# Patient Record
Sex: Male | Born: 1960 | Race: White | Hispanic: No | Smoking: Former smoker
Health system: Southern US, Community
[De-identification: ages and names within clinical notes are randomized; demographics above are authoritative.]

## PROBLEM LIST (undated history)

## (undated) DIAGNOSIS — E785 Hyperlipidemia, unspecified: Secondary | ICD-10-CM

## (undated) DIAGNOSIS — F1011 Alcohol abuse, in remission: Secondary | ICD-10-CM

## (undated) DIAGNOSIS — G8929 Other chronic pain: Secondary | ICD-10-CM

## (undated) DIAGNOSIS — M545 Low back pain, unspecified: Secondary | ICD-10-CM

## (undated) DIAGNOSIS — F1120 Opioid dependence, uncomplicated: Secondary | ICD-10-CM

## (undated) DIAGNOSIS — F329 Major depressive disorder, single episode, unspecified: Secondary | ICD-10-CM

## (undated) DIAGNOSIS — R112 Nausea with vomiting, unspecified: Secondary | ICD-10-CM

## (undated) DIAGNOSIS — R42 Dizziness and giddiness: Secondary | ICD-10-CM

## (undated) DIAGNOSIS — G4700 Insomnia, unspecified: Secondary | ICD-10-CM

## (undated) DIAGNOSIS — C4490 Unspecified malignant neoplasm of skin, unspecified: Secondary | ICD-10-CM

## (undated) DIAGNOSIS — K859 Acute pancreatitis without necrosis or infection, unspecified: Secondary | ICD-10-CM

## (undated) DIAGNOSIS — R202 Paresthesia of skin: Secondary | ICD-10-CM

## (undated) DIAGNOSIS — Z9889 Other specified postprocedural states: Secondary | ICD-10-CM

## (undated) DIAGNOSIS — R200 Anesthesia of skin: Secondary | ICD-10-CM

## (undated) DIAGNOSIS — F32A Depression, unspecified: Secondary | ICD-10-CM

## (undated) DIAGNOSIS — R9431 Abnormal electrocardiogram [ECG] [EKG]: Secondary | ICD-10-CM

## (undated) DIAGNOSIS — F419 Anxiety disorder, unspecified: Secondary | ICD-10-CM

## (undated) DIAGNOSIS — R531 Weakness: Secondary | ICD-10-CM

## (undated) DIAGNOSIS — M1990 Unspecified osteoarthritis, unspecified site: Secondary | ICD-10-CM

## (undated) DIAGNOSIS — I7771 Dissection of carotid artery: Secondary | ICD-10-CM

## (undated) DIAGNOSIS — I723 Aneurysm of iliac artery: Secondary | ICD-10-CM

## (undated) HISTORY — PX: TIBIA FRACTURE SURGERY: SHX806

## (undated) HISTORY — DX: Anesthesia of skin: R20.0

## (undated) HISTORY — DX: Abnormal electrocardiogram (ECG) (EKG): R94.31

## (undated) HISTORY — PX: MOHS SURGERY: SUR867

## (undated) HISTORY — DX: Dissection of carotid artery: I77.71

## (undated) HISTORY — PX: ANTERIOR CERVICAL DECOMP/DISCECTOMY FUSION: SHX1161

## (undated) HISTORY — DX: Anesthesia of skin: R20.2

## (undated) HISTORY — DX: Opioid dependence, uncomplicated: F11.20

## (undated) HISTORY — PX: APPENDECTOMY: SHX54

## (undated) HISTORY — PX: COLONOSCOPY: SHX174

## (undated) HISTORY — PX: BACK SURGERY: SHX140

---

## 2001-09-19 ENCOUNTER — Encounter: Payer: Self-pay | Admitting: Neurosurgery

## 2001-09-19 ENCOUNTER — Encounter: Admission: RE | Admit: 2001-09-19 | Discharge: 2001-09-19 | Payer: Self-pay | Admitting: Neurosurgery

## 2001-09-29 ENCOUNTER — Encounter: Admission: RE | Admit: 2001-09-29 | Discharge: 2001-09-29 | Payer: Self-pay | Admitting: Neurosurgery

## 2001-09-29 ENCOUNTER — Encounter: Payer: Self-pay | Admitting: Neurosurgery

## 2001-10-13 ENCOUNTER — Encounter: Admission: RE | Admit: 2001-10-13 | Discharge: 2001-10-13 | Payer: Self-pay | Admitting: Neurosurgery

## 2001-10-13 ENCOUNTER — Encounter: Payer: Self-pay | Admitting: Neurosurgery

## 2001-10-23 ENCOUNTER — Encounter: Admission: RE | Admit: 2001-10-23 | Discharge: 2001-10-23 | Payer: Self-pay | Admitting: Neurosurgery

## 2001-10-23 ENCOUNTER — Encounter: Payer: Self-pay | Admitting: Neurosurgery

## 2001-12-29 ENCOUNTER — Encounter: Payer: Self-pay | Admitting: Neurosurgery

## 2001-12-29 ENCOUNTER — Inpatient Hospital Stay (HOSPITAL_COMMUNITY): Admission: RE | Admit: 2001-12-29 | Discharge: 2001-12-30 | Payer: Self-pay | Admitting: Neurosurgery

## 2002-04-19 ENCOUNTER — Encounter: Payer: Self-pay | Admitting: Family Medicine

## 2002-04-19 ENCOUNTER — Encounter: Admission: RE | Admit: 2002-04-19 | Discharge: 2002-04-19 | Payer: Self-pay | Admitting: Family Medicine

## 2002-05-24 ENCOUNTER — Encounter: Admission: RE | Admit: 2002-05-24 | Discharge: 2002-05-24 | Payer: Self-pay | Admitting: Family Medicine

## 2002-05-24 ENCOUNTER — Encounter: Payer: Self-pay | Admitting: Family Medicine

## 2002-07-23 ENCOUNTER — Encounter: Admission: RE | Admit: 2002-07-23 | Discharge: 2002-07-23 | Payer: Self-pay | Admitting: Gastroenterology

## 2002-07-23 ENCOUNTER — Encounter: Payer: Self-pay | Admitting: Gastroenterology

## 2003-01-14 ENCOUNTER — Emergency Department (HOSPITAL_COMMUNITY): Admission: AD | Admit: 2003-01-14 | Discharge: 2003-01-14 | Payer: Self-pay | Admitting: Family Medicine

## 2003-01-16 ENCOUNTER — Ambulatory Visit (HOSPITAL_COMMUNITY): Admission: RE | Admit: 2003-01-16 | Discharge: 2003-01-16 | Payer: Self-pay | Admitting: Family Medicine

## 2003-02-06 ENCOUNTER — Encounter: Admission: RE | Admit: 2003-02-06 | Discharge: 2003-02-06 | Payer: Self-pay | Admitting: Family Medicine

## 2004-02-11 ENCOUNTER — Encounter: Admission: RE | Admit: 2004-02-11 | Discharge: 2004-02-11 | Payer: Self-pay | Admitting: Neurosurgery

## 2004-10-06 ENCOUNTER — Encounter: Admission: RE | Admit: 2004-10-06 | Discharge: 2004-10-06 | Payer: Self-pay | Admitting: Orthopedic Surgery

## 2005-08-26 ENCOUNTER — Encounter: Admission: RE | Admit: 2005-08-26 | Discharge: 2005-08-26 | Payer: Self-pay | Admitting: Neurosurgery

## 2006-01-31 ENCOUNTER — Encounter: Admission: RE | Admit: 2006-01-31 | Discharge: 2006-01-31 | Payer: Self-pay | Admitting: Family Medicine

## 2007-03-09 HISTORY — PX: OTHER SURGICAL HISTORY: SHX169

## 2007-07-07 ENCOUNTER — Encounter: Admission: RE | Admit: 2007-07-07 | Discharge: 2007-07-07 | Payer: Self-pay | Admitting: Neurosurgery

## 2008-01-26 ENCOUNTER — Encounter: Admission: RE | Admit: 2008-01-26 | Discharge: 2008-01-26 | Payer: Self-pay | Admitting: Orthopedic Surgery

## 2008-02-08 ENCOUNTER — Ambulatory Visit (HOSPITAL_BASED_OUTPATIENT_CLINIC_OR_DEPARTMENT_OTHER): Admission: RE | Admit: 2008-02-08 | Discharge: 2008-02-08 | Payer: Self-pay | Admitting: Orthopedic Surgery

## 2008-11-28 ENCOUNTER — Encounter: Admission: RE | Admit: 2008-11-28 | Discharge: 2008-11-28 | Payer: Self-pay | Admitting: Family Medicine

## 2009-03-08 HISTORY — PX: KNEE ARTHROSCOPY: SHX127

## 2009-07-16 ENCOUNTER — Encounter: Admission: RE | Admit: 2009-07-16 | Discharge: 2009-07-16 | Payer: Self-pay | Admitting: Neurosurgery

## 2009-08-01 ENCOUNTER — Encounter: Admission: RE | Admit: 2009-08-01 | Discharge: 2009-08-01 | Payer: Self-pay | Admitting: Neurosurgery

## 2009-08-14 ENCOUNTER — Encounter: Admission: RE | Admit: 2009-08-14 | Discharge: 2009-08-14 | Payer: Self-pay | Admitting: Neurosurgery

## 2009-09-23 ENCOUNTER — Encounter: Admission: RE | Admit: 2009-09-23 | Discharge: 2009-09-23 | Payer: Self-pay | Admitting: Neurosurgery

## 2009-12-16 ENCOUNTER — Encounter: Admission: RE | Admit: 2009-12-16 | Discharge: 2009-12-16 | Payer: Self-pay | Admitting: Neurosurgery

## 2010-01-16 ENCOUNTER — Encounter: Admission: RE | Admit: 2010-01-16 | Discharge: 2010-01-16 | Payer: Self-pay | Admitting: Neurosurgery

## 2010-05-06 ENCOUNTER — Other Ambulatory Visit: Payer: Self-pay | Admitting: Neurosurgery

## 2010-05-06 ENCOUNTER — Ambulatory Visit
Admission: RE | Admit: 2010-05-06 | Discharge: 2010-05-06 | Disposition: A | Discharge status: Home / Self Care | Payer: PRIVATE HEALTH INSURANCE | Source: Ambulatory Visit | Attending: Neurosurgery | Admitting: Neurosurgery

## 2010-05-06 DIAGNOSIS — M542 Cervicalgia: Secondary | ICD-10-CM

## 2010-05-06 DIAGNOSIS — R29898 Other symptoms and signs involving the musculoskeletal system: Secondary | ICD-10-CM

## 2010-07-21 NOTE — Op Note (Signed)
NAMEGIFFORD, Leonard [name] NO.:  [id number]   MEDICAL RECORD NO.:  [id number]          PATIENT TYPE:  AMB   LOCATION:  DSC                          FACILITY:  [facility]   PHYSICIAN:  Loreta Ave, M.D. DATE OF BIRTH:  1960/10/03   DATE OF PROCEDURE:  02/08/2008  DATE OF DISCHARGE:                               OPERATIVE REPORT   PREOPERATIVE DIAGNOSIS:  Medial meniscus tear, right knee.   POSTOPERATIVE DIAGNOSIS:  Medial meniscus tear, right knee.   PROCEDURE:  Right knee exam under anesthesia, arthroscopy, and partial  medial meniscectomy.   SURGEON:  Loreta Ave, MD   ASSISTANT:  Genene Churn. Barry Dienes, [place]   ANESTHESIA:  Knee block with sedation.   SPECIMENS:  None.   CONSULTS:  None.   COMPLICATIONS:  None.   DRESSINGS:  Soft compressive.   PROCEDURE:  The patient was brought to the operating room, placed on the  operating table in supine position.  After adequate anesthesia had been  obtained, knee examined.  Good motion and good stability.  Tourniquet  and leg holder applied.  Leg prepped and draped in usual sterile  fashion.  Two portals, one each medial and lateral parapatellar.  Arthroscope introduced, knee distended and knee inspected.  Good  patellofemoral tracking.  Hypertrophic synovitis medially debrided.  Lateral meniscus, lateral compartment, and cruciate ligaments were  normal.  Medial compartment looked good but complex tearing, posterior  medial meniscus.  Taken down through a stable rim, tapered into  remaining meniscus.  Entire knee examined.  All the findings  appreciated.  Instruments and fluid removed.  Portals of knee injected  with Marcaine.  Portals closed with 4-0 nylon.  Sterile compressive  dressing applied.  Anesthesia reversed.  Brought to recovery room.  Tolerated surgery well.  No complications.      Loreta Ave, M.D.  Electronically Signed     DFM/MEDQ  D:  02/08/2008  T:  02/08/2008  Job:  [id number]

## 2010-07-24 NOTE — H&P (Signed)
NAME:  Leonard [name], Leonard [name]                        ACCOUNT NO.:  [id number]   MEDICAL RECORD NO.:  [id number]                   PATIENT TYPE:  INP   LOCATION:  NA                                   FACILITY:  [facility]   PHYSICIAN:  Payton Doughty, M.D.                   DATE OF BIRTH:  [birth year]   DATE OF ADMISSION:  12/29/2001  DATE OF DISCHARGE:                                HISTORY & PHYSICAL   ADMISSION DIAGNOSIS:  Herniated disk C3-4.   HISTORY OF PRESENT ILLNESS:  This is a 50 year old right-handed white  gentleman whom we saw in July.  He had been having pain in the base of his  neck which resolved.  He had then pain in his neck toward his right  shoulder, numbness of the right arm, and pain in the right trapezius.  MRI  showed a disk at C3-4.  Did cervical epidural steroids, had a transient  improvement but still has intractable pain.  He is now admitted for an  anterior cervical diskectomy and fusion.   PAST MEDICAL HISTORY:  Otherwise. unremarkable.   MEDICATIONS:  1. Allegra 60 mg once daily.  2. Valium 5 mg at night.  3. Vicodin 5/500 1-2 at night.   PAST SURGICAL HISTORY:  1. Appendectomy at 13.  2. Right hand operation by Dr. Teressa Senter for penetrating injury in the palm.   SOCIAL HISTORY:  The patient does not smoke, drinks only socially, and is a  Firefighter.   FAMILY HISTORY:  Mother is 10, in good health with arthritis.  Dad is  deceased, no history is given.   REVIEW OF SYSTEMS:  Remarkable for occasional indigestion, broken bone in  his leg, arm weakness, arm pain, neck pain, food and inhalant allergies.   PHYSICAL EXAMINATION:  HEENT:  Within normal limits.  NECK:  Reasonable range of motion, turning head towards right or left pain  into right shoulder and can extend his neck.  CHEST:  Clear.  CARDIAC:  Regular rate and rhythm.  ABDOMEN:  Nontender with no hepatosplenomegaly.  EXTREMITIES:  Without clubbing or cyanosis.  GU:  Exam deferred.  PULSES:   Peripheral pulses are good.  NEUROLOGIC:  Awake, alert, and oriented.  Cranial nerves are intact.  Motor  exam shows 5/5 strength throughout the upper and lower extremities.  No  current sensory defect.  Reflexes 1 at the biceps, 1 at the triceps, 1 at  the brachial radialis bilaterally.  Hoffmann's is negative.  Lower  extremities are nonmyelopathic.   LABORATORY DATA:  MRI shows spondylitic disease at C3-4 with disk eccentric  to the right and neuroforaminal compression of the right C4 root.   CLINICAL IMPRESSION:  Cervical spondylosis with foraminal spur at C3-4.   PLAN:  Anterior cervical diskectomy and fusion.  The risks and benefits of  this approach have been discussed, and the patient wishes to proceed.  Payton Doughty, M.D.    MWR/MEDQ  D:  12/29/2001  T:  12/29/2001  Job:  [id number]

## 2010-07-24 NOTE — Op Note (Signed)
NAME:  EVERETT, EHRLER                        ACCOUNT NO.:  [id number]   MEDICAL RECORD NO.:  [id number]                   PATIENT TYPE:  INP   LOCATION:  NA                                   FACILITY:  [facility]   PHYSICIAN:  Payton Doughty, M.D.                   DATE OF BIRTH:  October 13, 1960   DATE OF PROCEDURE:  12/29/2001  DATE OF DISCHARGE:                                 OPERATIVE REPORT   PREOPERATIVE DIAGNOSIS:  Herniated disk at C3-4.   POSTOPERATIVE DIAGNOSIS:  Herniated disk at C3-4.   PROCEDURE:  C3-4 anterior cervical diskectomy and fusion with a Tether  plate.   SURGEON:  Payton Doughty, M.D.   ASSISTANTBasilia Jumbo.   ANESTHESIA:  General endotracheal.   PREPARATION:  Sterile Betadine prep and scrub with alcohol wipe.   DESCRIPTION OF PROCEDURE:  A 50 year old gentleman with a disk at C3-4.  He  was taken to the operating room and smoothly anesthetized and intubated,  placed supine on the operating table in the Holter head traction.  Following  shave, prep, and drape in the usual sterile fashion, the skin was incised in  the midline in the medial border of the sternocleidomastoid muscle on the  left side two fingerbreadths above the carotid tubercle.  The platysma was  identified, elevated, divided, and undermined.  The sternocleidomastoid was  identified, and medial dissection revealed the carotid artery retracted to  the left, the trachea and esophagus retracted laterally to the right,  exposing the bones of the anterior cervical spine.  A marker was placed and  intraoperative x-ray obtained to confirm correctness of level.  Having  confirmed correctness of the level, diskectomy was carried out at C3-4 under  gross observation.  We then brought in the operating microscope, and we used  microdissection technique to dissect the anterior epidural space to  decompress the nerve roots bilaterally.  On the right side there was disk  material and bone spur out compressing  the right C4 root.  Having achieved  complete decompression and removed the posterior longitudinal ligament, a 7  mm bone graft was fashioned from patellar allograft and tapped into place.  A 14 mm Tether plate was then placed with 13 mm screws, two in C3 and two in  C4.  Intraoperative x-ray showed good placement of bone graft, plate, and  screws.  The wound was irrigated and hemostasis assured.  The platysma was  reapproximated with 3-0 Vicryl in interrupted fashion, the subcutaneous  tissue was reapproximated with 3-0 Vicryl in interrupted fashion, and the  skin was closed with 4-0 Vicryl in running subcuticular fashion.  Benzoin  and Steri-Strips were placed and made occlusive with Telfa and OpSite.  The  patient was then placed in an Aspen collar and returned to the recovery room  in good condition.  Payton Doughty, M.D.    MWR/MEDQ  D:  12/29/2001  T:  12/29/2001  Job:  [id number]

## 2010-12-10 LAB — POCT HEMOGLOBIN-HEMACUE: Hemoglobin: 16 g/dL (ref 13.0–17.0)

## 2011-07-13 ENCOUNTER — Other Ambulatory Visit: Payer: Self-pay | Admitting: Neurosurgery

## 2011-07-13 DIAGNOSIS — M47817 Spondylosis without myelopathy or radiculopathy, lumbosacral region: Secondary | ICD-10-CM

## 2011-07-21 ENCOUNTER — Ambulatory Visit
Admission: RE | Admit: 2011-07-21 | Discharge: 2011-07-21 | Disposition: A | Discharge status: Home / Self Care | Payer: PRIVATE HEALTH INSURANCE | Source: Ambulatory Visit | Attending: Neurosurgery | Admitting: Neurosurgery

## 2011-07-21 DIAGNOSIS — M47817 Spondylosis without myelopathy or radiculopathy, lumbosacral region: Secondary | ICD-10-CM

## 2011-08-05 ENCOUNTER — Other Ambulatory Visit: Payer: Self-pay | Admitting: Neurosurgery

## 2011-08-05 DIAGNOSIS — M47816 Spondylosis without myelopathy or radiculopathy, lumbar region: Secondary | ICD-10-CM

## 2011-08-10 ENCOUNTER — Ambulatory Visit
Admission: RE | Admit: 2011-08-10 | Discharge: 2011-08-10 | Disposition: A | Discharge status: Home / Self Care | Payer: PRIVATE HEALTH INSURANCE | Source: Ambulatory Visit | Attending: Neurosurgery | Admitting: Neurosurgery

## 2011-08-10 DIAGNOSIS — M47816 Spondylosis without myelopathy or radiculopathy, lumbar region: Secondary | ICD-10-CM

## 2011-08-10 MED ORDER — IOHEXOL 180 MG/ML  SOLN
1.0000 mL | Freq: Once | INTRAMUSCULAR | Status: AC | PRN
  Administered 2011-08-10: 1 mL via EPIDURAL

## 2011-08-10 MED ORDER — METHYLPREDNISOLONE ACETATE 40 MG/ML INJ SUSP (RADIOLOG
60.0000 mg | Freq: Once | INTRAMUSCULAR | Status: AC
  Administered 2011-08-10: 60 mg via EPIDURAL

## 2011-08-12 ENCOUNTER — Other Ambulatory Visit: Payer: PRIVATE HEALTH INSURANCE

## 2011-08-23 ENCOUNTER — Other Ambulatory Visit: Payer: Self-pay | Admitting: Family Medicine

## 2011-08-23 ENCOUNTER — Ambulatory Visit
Admission: RE | Admit: 2011-08-23 | Discharge: 2011-08-23 | Disposition: A | Discharge status: Home / Self Care | Payer: PRIVATE HEALTH INSURANCE | Source: Ambulatory Visit | Attending: Family Medicine | Admitting: Family Medicine

## 2011-08-23 DIAGNOSIS — M79609 Pain in unspecified limb: Secondary | ICD-10-CM

## 2011-10-14 ENCOUNTER — Other Ambulatory Visit: Payer: Self-pay | Admitting: Neurosurgery

## 2011-10-14 DIAGNOSIS — M47816 Spondylosis without myelopathy or radiculopathy, lumbar region: Secondary | ICD-10-CM

## 2012-01-04 ENCOUNTER — Ambulatory Visit
Admission: RE | Admit: 2012-01-04 | Discharge: 2012-01-04 | Disposition: A | Discharge status: Home / Self Care | Payer: PRIVATE HEALTH INSURANCE | Source: Ambulatory Visit | Attending: Neurosurgery | Admitting: Neurosurgery

## 2012-01-04 ENCOUNTER — Other Ambulatory Visit: Payer: Self-pay | Admitting: Neurosurgery

## 2012-01-04 DIAGNOSIS — M47816 Spondylosis without myelopathy or radiculopathy, lumbar region: Secondary | ICD-10-CM

## 2012-01-04 MED ORDER — METHYLPREDNISOLONE ACETATE 40 MG/ML INJ SUSP (RADIOLOG
120.0000 mg | Freq: Once | INTRAMUSCULAR | Status: AC
  Administered 2012-01-04: 120 mg via EPIDURAL

## 2012-01-04 MED ORDER — IOHEXOL 180 MG/ML  SOLN
1.0000 mL | Freq: Once | INTRAMUSCULAR | Status: AC | PRN
  Administered 2012-01-04: 1 mL via EPIDURAL

## 2012-03-06 ENCOUNTER — Other Ambulatory Visit: Payer: Self-pay | Admitting: Neurosurgery

## 2012-03-06 DIAGNOSIS — M47816 Spondylosis without myelopathy or radiculopathy, lumbar region: Secondary | ICD-10-CM

## 2012-03-07 ENCOUNTER — Ambulatory Visit
Admission: RE | Admit: 2012-03-07 | Discharge: 2012-03-07 | Disposition: A | Discharge status: Home / Self Care | Payer: PRIVATE HEALTH INSURANCE | Source: Ambulatory Visit | Attending: Neurosurgery | Admitting: Neurosurgery

## 2012-03-07 DIAGNOSIS — M47816 Spondylosis without myelopathy or radiculopathy, lumbar region: Secondary | ICD-10-CM

## 2012-03-07 MED ORDER — IOHEXOL 180 MG/ML  SOLN
1.0000 mL | Freq: Once | INTRAMUSCULAR | Status: AC | PRN
  Administered 2012-03-07: 1 mL via EPIDURAL

## 2012-03-07 MED ORDER — METHYLPREDNISOLONE ACETATE 40 MG/ML INJ SUSP (RADIOLOG
120.0000 mg | Freq: Once | INTRAMUSCULAR | Status: AC
  Administered 2012-03-07: 120 mg via EPIDURAL

## 2012-03-08 HISTORY — PX: LUMBAR LAMINECTOMY: SHX95

## 2012-05-29 ENCOUNTER — Other Ambulatory Visit: Payer: Self-pay | Admitting: Neurosurgery

## 2012-05-29 DIAGNOSIS — M545 Low back pain: Secondary | ICD-10-CM

## 2012-05-29 DIAGNOSIS — M47816 Spondylosis without myelopathy or radiculopathy, lumbar region: Secondary | ICD-10-CM

## 2012-05-29 DIAGNOSIS — M79604 Pain in right leg: Secondary | ICD-10-CM

## 2012-05-30 ENCOUNTER — Other Ambulatory Visit: Payer: PRIVATE HEALTH INSURANCE

## 2012-05-30 ENCOUNTER — Ambulatory Visit
Admission: RE | Admit: 2012-05-30 | Discharge: 2012-05-30 | Disposition: A | Discharge status: Home / Self Care | Payer: PRIVATE HEALTH INSURANCE | Source: Ambulatory Visit | Attending: Neurosurgery | Admitting: Neurosurgery

## 2012-05-30 DIAGNOSIS — M47816 Spondylosis without myelopathy or radiculopathy, lumbar region: Secondary | ICD-10-CM

## 2012-05-30 DIAGNOSIS — M79604 Pain in right leg: Secondary | ICD-10-CM

## 2012-05-30 DIAGNOSIS — M545 Low back pain: Secondary | ICD-10-CM

## 2012-06-28 ENCOUNTER — Other Ambulatory Visit: Payer: Self-pay | Admitting: Neurosurgery

## 2012-06-28 DIAGNOSIS — M47816 Spondylosis without myelopathy or radiculopathy, lumbar region: Secondary | ICD-10-CM

## 2012-07-04 ENCOUNTER — Other Ambulatory Visit: Payer: PRIVATE HEALTH INSURANCE

## 2012-07-04 ENCOUNTER — Ambulatory Visit
Admission: RE | Admit: 2012-07-04 | Discharge: 2012-07-04 | Disposition: A | Discharge status: Home / Self Care | Payer: PRIVATE HEALTH INSURANCE | Source: Ambulatory Visit | Attending: Neurosurgery | Admitting: Neurosurgery

## 2012-07-04 DIAGNOSIS — M47816 Spondylosis without myelopathy or radiculopathy, lumbar region: Secondary | ICD-10-CM

## 2012-07-04 MED ORDER — METHYLPREDNISOLONE ACETATE 40 MG/ML INJ SUSP (RADIOLOG
120.0000 mg | Freq: Once | INTRAMUSCULAR | Status: AC
  Administered 2012-07-04: 120 mg via INTRA_ARTICULAR

## 2012-07-04 MED ORDER — IOHEXOL 180 MG/ML  SOLN
1.0000 mL | Freq: Once | INTRAMUSCULAR | Status: AC | PRN
  Administered 2012-07-04: 1 mL via INTRA_ARTICULAR

## 2012-10-18 ENCOUNTER — Other Ambulatory Visit: Payer: Self-pay | Admitting: Family Medicine

## 2012-10-18 DIAGNOSIS — R7989 Other specified abnormal findings of blood chemistry: Secondary | ICD-10-CM

## 2012-10-20 ENCOUNTER — Ambulatory Visit
Admission: RE | Admit: 2012-10-20 | Discharge: 2012-10-20 | Disposition: A | Discharge status: Home / Self Care | Payer: PRIVATE HEALTH INSURANCE | Source: Ambulatory Visit | Attending: Family Medicine | Admitting: Family Medicine

## 2012-10-20 DIAGNOSIS — R7989 Other specified abnormal findings of blood chemistry: Secondary | ICD-10-CM

## 2013-03-08 HISTORY — PX: POSTERIOR LUMBAR FUSION: SHX6036

## 2013-03-27 ENCOUNTER — Other Ambulatory Visit: Payer: Self-pay | Admitting: Neurosurgery

## 2013-03-27 DIAGNOSIS — M47817 Spondylosis without myelopathy or radiculopathy, lumbosacral region: Secondary | ICD-10-CM

## 2013-03-31 ENCOUNTER — Ambulatory Visit
Admission: RE | Admit: 2013-03-31 | Discharge: 2013-03-31 | Disposition: A | Discharge status: Home / Self Care | Payer: PRIVATE HEALTH INSURANCE | Source: Ambulatory Visit | Attending: Neurosurgery | Admitting: Neurosurgery

## 2013-03-31 DIAGNOSIS — M47817 Spondylosis without myelopathy or radiculopathy, lumbosacral region: Secondary | ICD-10-CM

## 2013-03-31 MED ORDER — GADOBENATE DIMEGLUMINE 529 MG/ML IV SOLN
17.0000 mL | Freq: Once | INTRAVENOUS | Status: AC | PRN
  Administered 2013-03-31: 17 mL via INTRAVENOUS

## 2013-04-03 ENCOUNTER — Other Ambulatory Visit: Payer: Self-pay | Admitting: Family Medicine

## 2013-04-03 DIAGNOSIS — I723 Aneurysm of iliac artery: Secondary | ICD-10-CM

## 2013-04-12 ENCOUNTER — Ambulatory Visit
Admission: RE | Admit: 2013-04-12 | Discharge: 2013-04-12 | Disposition: A | Discharge status: Home / Self Care | Payer: PRIVATE HEALTH INSURANCE | Source: Ambulatory Visit | Attending: Family Medicine | Admitting: Family Medicine

## 2013-04-12 DIAGNOSIS — I723 Aneurysm of iliac artery: Secondary | ICD-10-CM

## 2013-04-12 MED ORDER — IOHEXOL 350 MG/ML SOLN
100.0000 mL | Freq: Once | INTRAVENOUS | Status: AC | PRN
  Administered 2013-04-12: 100 mL via INTRAVENOUS

## 2013-08-09 ENCOUNTER — Other Ambulatory Visit: Payer: Self-pay | Admitting: Neurosurgery

## 2013-08-19 ENCOUNTER — Emergency Department (HOSPITAL_COMMUNITY)
Admission: EM | Admit: 2013-08-19 | Discharge: 2013-08-19 | Discharge status: Against Medical Advice | Payer: PRIVATE HEALTH INSURANCE | Attending: Emergency Medicine | Admitting: Emergency Medicine

## 2013-08-19 ENCOUNTER — Encounter (HOSPITAL_COMMUNITY): Payer: Self-pay | Admitting: Emergency Medicine

## 2013-08-19 ENCOUNTER — Emergency Department (HOSPITAL_COMMUNITY)
Admission: EM | Admit: 2013-08-19 | Discharge: 2013-08-20 | Disposition: A | Discharge status: Home / Self Care | Payer: PRIVATE HEALTH INSURANCE | Attending: Emergency Medicine | Admitting: Emergency Medicine

## 2013-08-19 DIAGNOSIS — L539 Erythematous condition, unspecified: Secondary | ICD-10-CM | POA: Insufficient documentation

## 2013-08-19 DIAGNOSIS — F1120 Opioid dependence, uncomplicated: Secondary | ICD-10-CM

## 2013-08-19 DIAGNOSIS — F1910 Other psychoactive substance abuse, uncomplicated: Secondary | ICD-10-CM

## 2013-08-19 DIAGNOSIS — F1310 Sedative, hypnotic or anxiolytic abuse, uncomplicated: Secondary | ICD-10-CM | POA: Insufficient documentation

## 2013-08-19 DIAGNOSIS — F1510 Other stimulant abuse, uncomplicated: Secondary | ICD-10-CM | POA: Insufficient documentation

## 2013-08-19 DIAGNOSIS — F1010 Alcohol abuse, uncomplicated: Secondary | ICD-10-CM | POA: Insufficient documentation

## 2013-08-19 DIAGNOSIS — F1020 Alcohol dependence, uncomplicated: Secondary | ICD-10-CM | POA: Diagnosis present

## 2013-08-19 DIAGNOSIS — Z87891 Personal history of nicotine dependence: Secondary | ICD-10-CM | POA: Insufficient documentation

## 2013-08-19 DIAGNOSIS — Z79899 Other long term (current) drug therapy: Secondary | ICD-10-CM | POA: Insufficient documentation

## 2013-08-19 HISTORY — DX: Anxiety disorder, unspecified: F41.9

## 2013-08-19 HISTORY — DX: Major depressive disorder, single episode, unspecified: F32.9

## 2013-08-19 HISTORY — DX: Depression, unspecified: F32.A

## 2013-08-19 LAB — CBC WITH DIFFERENTIAL/PLATELET
Basophils Absolute: 0 10*3/uL (ref 0.0–0.1)
Basophils Relative: 0 % (ref 0–1)
Eosinophils Absolute: 0 10*3/uL (ref 0.0–0.7)
Eosinophils Relative: 0 % (ref 0–5)
HCT: 45.7 % (ref 39.0–52.0)
Hemoglobin: 16.3 g/dL (ref 13.0–17.0)
Lymphocytes Relative: 21 % (ref 12–46)
Lymphs Abs: 1 10*3/uL (ref 0.7–4.0)
MCH: 34.8 pg — ABNORMAL HIGH (ref 26.0–34.0)
MCHC: 35.7 g/dL (ref 30.0–36.0)
MCV: 97.6 fL (ref 78.0–100.0)
Monocytes Absolute: 0.4 10*3/uL (ref 0.1–1.0)
Monocytes Relative: 9 % (ref 3–12)
Neutro Abs: 3.3 10*3/uL (ref 1.7–7.7)
Neutrophils Relative %: 70 % (ref 43–77)
Platelets: 108 10*3/uL — ABNORMAL LOW (ref 150–400)
RBC: 4.68 MIL/uL (ref 4.22–5.81)
RDW: 13 % (ref 11.5–15.5)
WBC: 4.7 10*3/uL (ref 4.0–10.5)

## 2013-08-19 LAB — COMPREHENSIVE METABOLIC PANEL
ALT: 104 U/L — ABNORMAL HIGH (ref 0–53)
AST: 143 U/L — ABNORMAL HIGH (ref 0–37)
Albumin: 4.1 g/dL (ref 3.5–5.2)
Alkaline Phosphatase: 64 U/L (ref 39–117)
BUN: 10 mg/dL (ref 6–23)
CO2: 24 mEq/L (ref 19–32)
Calcium: 9.6 mg/dL (ref 8.4–10.5)
Chloride: 98 mEq/L (ref 96–112)
Creatinine, Ser: 0.73 mg/dL (ref 0.50–1.35)
GFR calc Af Amer: >90 mL/min (ref >90)
GFR calc non Af Amer: >90 mL/min (ref >90)
Glucose, Bld: 97 mg/dL (ref 70–99)
Potassium: 4.3 mEq/L (ref 3.7–5.3)
Sodium: 138 mEq/L (ref 137–147)
Total Bilirubin: 0.8 mg/dL (ref 0.3–1.2)
Total Protein: 7.6 g/dL (ref 6.0–8.3)

## 2013-08-19 LAB — RAPID URINE DRUG SCREEN, HOSP PERFORMED
Amphetamines: POSITIVE — AB
Barbiturates: NOT DETECTED
Benzodiazepines: POSITIVE — AB
Cocaine: NOT DETECTED
Opiates: NOT DETECTED
Tetrahydrocannabinol: NOT DETECTED

## 2013-08-19 LAB — ETHANOL: Alcohol, Ethyl (B): 57 mg/dL — ABNORMAL HIGH (ref 0–11)

## 2013-08-19 LAB — LIPASE, BLOOD: Lipase: 66 U/L — ABNORMAL HIGH (ref 11–59)

## 2013-08-19 MED ORDER — LORAZEPAM 2 MG/ML IJ SOLN
1.0000 mg | Freq: Once | INTRAMUSCULAR | Status: AC
  Administered 2013-08-19: 1 mg via INTRAVENOUS
  Filled 2013-08-19: qty 1

## 2013-08-19 MED ORDER — ONDANSETRON HCL 4 MG/2ML IJ SOLN
4.0000 mg | Freq: Once | INTRAMUSCULAR | Status: AC
  Administered 2013-08-19: 4 mg via INTRAVENOUS
  Filled 2013-08-19: qty 2

## 2013-08-19 MED ORDER — ONDANSETRON HCL 4 MG/2ML IJ SOLN: 4.0000 mg | Freq: Once | INTRAMUSCULAR | Status: DC

## 2013-08-19 MED ORDER — SODIUM CHLORIDE 0.9 % IV SOLN
INTRAVENOUS | Status: DC
  Administered 2013-08-19 – 2013-08-20 (×2): via INTRAVENOUS

## 2013-08-19 MED ORDER — SODIUM CHLORIDE 0.9 % IV SOLN
INTRAVENOUS | Status: DC
Start: 2013-08-20 — End: 2013-08-19

## 2013-08-19 MED ORDER — SODIUM CHLORIDE 0.9 % IV BOLUS (SEPSIS): 500.0000 mL | Freq: Once | INTRAVENOUS | Status: DC

## 2013-08-19 NOTE — BH Assessment (Signed)
Tele Assessment Note   Leonard [name] is an 53 y.o. male who presents voluntarily accompanied by his wife Suanne Marker.  Pt requesting detox from alcohol and oxycodone. Pt reported he has been dealing with major back pain for several years. Pt stated he had surgery 01/2013 and it has not helped with is back pain. Pt stated he has been taking 12-14 pills a day since November. Pt reported he has another back surgery scheduled for 09/10/13 and surgeon stated that pt needs to be off oxycodone for the surgery as well as alcohol. Pt reported increased alcohol use since 01/2013 as well. Pt reported drinking about 2 bottles of wine daily.  Pt reported experiencing shakiness. Pt denied hx of seizures and blackouts.   Pt Ox4. Pt denied SI, HI, and AVH. Pt reported anxious and depressed mood and presented with congruent affect.  Pt reported he has been depressed due to his back pain and the last surgery not working. Pt reported that it is difficult to get around much due to his back pain. Pt reported hx of SI but denied hx of attempts. Pt stated he wants to be here for his wife.  Pt reported family hx of alcohol abuse with his father and maternal grandfather who both died due to sorosis of the liver.    Axis I: Alcohol Use Disorder, moderate; Opioid Use Disorder, Moderate; Unspecified Depressive Disorder Axis II: Deferred Axis III: History reviewed. No pertinent past medical history. Axis IV: other psychosocial or environmental problems  Past Medical History: History reviewed. No pertinent past medical history.  Past Surgical History  Procedure Laterality Date  . Back surgery      Family History: No family history on file.  Social History:  reports that he quit smoking about 35 years ago. His smoking use included Cigarettes. He has a 1 pack-year smoking history. He has never used smokeless tobacco. His alcohol and drug histories are not on file.  Additional Social History:  Alcohol / Drug Use Pain  Medications: Oxcodone Prescriptions: Sertraline, Trazadone Over the Counter: none reported History of alcohol / drug use?: Yes Longest period of sobriety (when/how long): unk Withdrawal Symptoms: Tremors;Weakness;Sweats Substance #1 Name of Substance 1: alcohol 1 - Age of First Use: unk 1 - Amount (size/oz): 2 bottles of wine 1 - Frequency: daily 1 - Duration: increased use since Nov 2014 1 - Last Use / Amount: unk Substance #2 Name of Substance 2: opiates (oxycodone) 2 - Age of First Use: unk 2 - Amount (size/oz): 12-14 pills 2 - Frequency: daily 2 - Duration: since Nov 2014 2 - Last Use / Amount: 08/19/13  CIWA: CIWA-Ar BP: 125/87 mmHg Pulse Rate: 98 COWS:    Allergies:  Allergies  Allergen Reactions  . Sulfa Antibiotics Swelling and Rash    Home Medications:  (Not in a hospital admission)  OB/GYN Status:  No LMP for male patient.  General Assessment Data Location of Assessment: [facility] ED Is this a Tele or Face-to-Face Assessment?: Tele Assessment Is this an Initial Assessment or a Re-assessment for this encounter?: Initial Assessment Living Arrangements: Spouse/significant other Can pt return to current living arrangement?: Yes Admission Status: Voluntary Is patient capable of signing voluntary admission?: Yes Transfer from: [facility] Referral Source: Self/Family/Friend  Medical Screening Exam ([place]) Medical Exam completed:  (NA)  [place] Living Arrangements: Spouse/significant other Name of Psychiatrist:  (NA) Name of Therapist:  Altha Harm)     Risk to self Suicidal Ideation: No-Not Currently/Within  Last 6 Months Suicidal Intent: No Is patient at risk for suicide?: No Suicidal Plan?: No Access to Means: No What has been your use of drugs/alcohol within the last 12 months?:  (etoh and oxycodone) Previous Attempts/Gestures: No How many times?: 0 Other Self Harm Risks: none Triggers for Past Attempts: None  known Intentional Self Injurious Behavior: None Family Suicide History: Unknown Recent stressful life event(s): Other (Comment) (Back issues) Persecutory voices/beliefs?: No Depression: Yes Depression Symptoms: Despondent;Insomnia;Tearfulness;Fatigue;Guilt;Feeling worthless/self pity;Feeling angry/irritable Substance abuse history and/or treatment for substance abuse?: Yes Suicide prevention information given to non-admitted patients: Not applicable  Risk to Others Homicidal Ideation: No Thoughts of Harm to Others: No Current Homicidal Intent: No Current Homicidal Plan: No Access to Homicidal Means: No Identified Victim:  (NA) History of harm to others?: No Assessment of Violence: None Noted Violent Behavior Description:  (Pt calm and cooperative at assessment. ) Does patient have access to weapons?: Yes (Comment) (Pt stated he owns several guns. ) Criminal Charges Pending?: No Does patient have a court date: No  Psychosis Hallucinations: None noted Delusions: None noted  Mental Status Report Appear/Hygiene: Unremarkable Eye Contact: Good Motor Activity: Unremarkable Speech: Logical/coherent Level of Consciousness: Alert Mood: Depressed;Anxious Affect: Anxious;Depressed Anxiety Level: Moderate Thought Processes: Coherent;Relevant Judgement: Unimpaired Orientation: Person;Place;Situation;Time Obsessive Compulsive Thoughts/Behaviors: None  Cognitive Functioning Concentration: Normal Memory: Recent Intact;Remote Intact IQ: Average Insight: Fair Impulse Control: Fair Appetite: Poor Weight Loss:  (unk) Weight Gain:  (unk) Sleep: Decreased Total Hours of Sleep: 5 Vegetative Symptoms: None  ADLScreening [facility] Assessment Services) Patient's cognitive ability adequate to safely complete daily activities?: Yes Patient able to express need for assistance with ADLs?: No Independently performs ADLs?: Yes (appropriate for developmental age)  Prior Inpatient Therapy Prior  Inpatient Therapy: Yes Prior Therapy Dates:  (25 years ago) Prior Therapy Facilty/Provider(s):  (unk) Reason for Treatment:  (cocaine detox)  Prior Outpatient Therapy Prior Outpatient Therapy: Yes Prior Therapy Dates:  (current) Prior Therapy Facilty/Provider(s):  ([facility]) Reason for Treatment:  (depression, anxiety)  ADL Screening (condition at time of admission) Patient's cognitive ability adequate to safely complete daily activities?: Yes Is the patient deaf or have difficulty hearing?: No Does the patient have difficulty seeing, even when wearing glasses/contacts?: No Does the patient have difficulty concentrating, remembering, or making decisions?: No Patient able to express need for assistance with ADLs?: No Does the patient have difficulty dressing or bathing?: Yes Independently performs ADLs?: Yes (appropriate for developmental age)       Abuse/Neglect Assessment (Assessment to be complete while patient is alone) Physical Abuse: Denies Verbal Abuse: Denies Sexual Abuse: Denies Exploitation of patient/patient's resources: Denies Self-Neglect: Denies Values / Beliefs Cultural Requests During Hospitalization: None Spiritual Requests During Hospitalization: None   Advance Directives (For Healthcare) Advance Directive: Patient does not have advance directive    Additional Information 1:1 In Past 12 Months?: No CIRT Risk: No Elopement Risk: No Does patient have medical clearance?: Yes    Disposition: Clinician consulted with Serena Colonel, NP who recommends inpatient detox. Inocencio Homes, [facility] reported no available beds at [facility]. TTS will seek placement at alternative facilities.   Disposition Initial Assessment Completed for this Encounter: Yes Disposition of Patient: Inpatient treatment program Type of inpatient treatment program: Adult  Stewart,Lynne Takemoto R 08/19/2013 9:02 PM

## 2013-08-19 NOTE — BH Assessment (Signed)
Clinician consulted with Serena Colonel, NP who recommends inpatient detox. Leonard [name], [facility] reported no available beds at [facility]. TTS will seek placement at alternative facilities. Clinician provided updates to Dr. Rogene Houston.  Fredonia Highland, MSW, LCSW Triage Specialist [phone]

## 2013-08-19 NOTE — BH Assessment (Signed)
Clinician contacted Dr. Rogene Houston to gather information prior to assessing. Pt requesting alcohol and opiate detox. He feels addicted to opiates due pain issues. Assessment to be initiated.   Leonard [name], MSW, LCSW Triage Specialist [phone]

## 2013-08-19 NOTE — ED Notes (Signed)
Patient is here for detox from oxy and ETOH. Patient last drink was about 30 mins ago and last pain medication was at the same time. Patient had back surgery Nov 2014, since patient has been abusing pain medication along with ETOH. Patient was seen by primary care MD and told him he needed to get help. He is ready to make changes.

## 2013-08-19 NOTE — ED Notes (Signed)
Telepsych in progress. 

## 2013-08-19 NOTE — ED Notes (Signed)
Pt requesting detox from oxycodone and ETOH. Pt states he takes a lot of pain meds for his back injury. Pt uses a cane to ambulate. Pt states he is having spinal fusion surgery in July and is trying to detox from oxycodone and ETOH now for further pain control after the surgery. Pt arrives with his wife. Pt states last oxycodone was 15mg  at 1500. Last ETOH was a glass of wine at 1500. Pt alert, no acute distress. Skin warm and dry. Pt denies SI/HI. Pt is using a heating pad on his back.

## 2013-08-19 NOTE — ED Provider Notes (Addendum)
CSN: [id number]     Arrival date & time 08/19/13  1607 History   First MD Initiated Contact with Patient 08/19/13 1612     Chief Complaint  Patient presents with  . Medical Clearance  . Detox      (Consider location/radiation/quality/duration/timing/severity/associated sxs/prior Treatment) The history is provided by the patient.   53 year old male while presents for detox from alcohol and oxycodone. Patient states he last had oxycodone which was 15 mg at 3:00 in the afternoon last had alcohol was a glass of wine at 3:00 in the afternoon. Patient's alert in no acute distress upon arrival. Patient denies suicidal or homicidal ideation. Patient wants alcohol detox and detox from narcotics.  History reviewed. No pertinent past medical history. Past Surgical History  Procedure Laterality Date  . Back surgery     No family history on file. History  Substance Use Topics  . Smoking status: Former Smoker -- 0.50 packs/day for 2 years    Types: Cigarettes    Quit date: 01/03/1978  . Smokeless tobacco: Never Used  . Alcohol Use: Not on file    Review of Systems  Constitutional: Negative for fever.  HENT: Negative for congestion.   Eyes: Negative for visual disturbance.  Respiratory: Negative for shortness of breath.   Cardiovascular: Negative for chest pain.  Gastrointestinal: Negative for abdominal pain.  Genitourinary: Negative for dysuria.  Musculoskeletal: Negative for back pain.  Skin: Negative for rash.  Neurological: Negative for headaches.  Hematological: Does not bruise/bleed easily.  Psychiatric/Behavioral: Negative for confusion.      Allergies  Sulfa antibiotics  Home Medications   Prior to Admission medications   Medication Sig Start Date End Date Taking? Authorizing Provider  oxycodone (OXY-IR) 5 MG capsule Take 10 mg by mouth every 4 (four) hours as needed for pain.   Yes Historical Provider, MD  sertraline (ZOLOFT) 50 MG tablet Take 50 mg by mouth 2 (two)  times daily.   Yes Historical Provider, MD  traZODone (DESYREL) 100 MG tablet Take 100-300 mg by mouth at bedtime. Sleep disturbance   Yes Historical Provider, MD   BP 122/80  Pulse 99  Temp(Src) 98.1 F (36.7 C) (Oral)  Resp 18  SpO2 100% Physical Exam  Nursing note and vitals reviewed. Constitutional: He is oriented to person, place, and time. He appears well-developed and well-nourished. No distress.  HENT:  Head: Normocephalic and atraumatic.  Mouth/Throat: Oropharynx is clear and moist.  Eyes: Conjunctivae and EOM are normal. Pupils are equal, round, and reactive to light.  Neck: Normal range of motion.  Cardiovascular: Normal rate, regular rhythm and normal heart sounds.   Pulmonary/Chest: Effort normal and breath sounds normal.  Abdominal: Soft. Bowel sounds are normal. There is no tenderness.  Musculoskeletal: Normal range of motion. He exhibits no edema.  Neurological: He is alert and oriented to person, place, and time. No cranial nerve deficit. He exhibits normal muscle tone. Coordination normal.  Skin: Skin is warm. No rash noted. There is erythema.    ED Course  Procedures (including critical care time) Labs Review Labs Reviewed  COMPREHENSIVE METABOLIC PANEL - Abnormal; Notable for the following:    AST 143 (*)    ALT 104 (*)    All other components within normal limits  LIPASE, BLOOD - Abnormal; Notable for the following:    Lipase 66 (*)    All other components within normal limits  CBC WITH DIFFERENTIAL - Abnormal; Notable for the following:    MCH 34.8 (*)  Platelets 108 (*)    All other components within normal limits  ETHANOL - Abnormal; Notable for the following:    Alcohol, Ethyl (B) 57 (*)    All other components within normal limits  URINE RAPID DRUG SCREEN (HOSP PERFORMED) - Abnormal; Notable for the following:    Benzodiazepines POSITIVE (*)    Amphetamines POSITIVE (*)    All other components within normal limits   Results for orders placed  during the hospital encounter of 08/19/13  COMPREHENSIVE METABOLIC PANEL      Result Value Ref Range   Sodium 138  137 - 147 mEq/L   Potassium 4.3  3.7 - 5.3 mEq/L   Chloride 98  96 - 112 mEq/L   CO2 24  19 - 32 mEq/L   Glucose, Bld 97  70 - 99 mg/dL   BUN 10  6 - 23 mg/dL   Creatinine, Ser 0.73  0.50 - 1.35 mg/dL   Calcium 9.6  8.4 - 10.5 mg/dL   Total Protein 7.6  6.0 - 8.3 g/dL   Albumin 4.1  3.5 - 5.2 g/dL   AST 143 (*) 0 - 37 U/L   ALT 104 (*) 0 - 53 U/L   Alkaline Phosphatase 64  39 - 117 U/L   Total Bilirubin 0.8  0.3 - 1.2 mg/dL   GFR calc non Af Amer >90  >90 mL/min   GFR calc Af Amer >90  >90 mL/min  LIPASE, BLOOD      Result Value Ref Range   Lipase 66 (*) 11 - 59 U/L  CBC WITH DIFFERENTIAL      Result Value Ref Range   WBC 4.7  4.0 - 10.5 K/uL   RBC 4.68  4.22 - 5.81 MIL/uL   Hemoglobin 16.3  13.0 - 17.0 g/dL   HCT 45.7  39.0 - 52.0 %   MCV 97.6  78.0 - 100.0 fL   MCH 34.8 (*) 26.0 - 34.0 pg   MCHC 35.7  30.0 - 36.0 g/dL   RDW 13.0  11.5 - 15.5 %   Platelets 108 (*) 150 - 400 K/uL   Neutrophils Relative % 70  43 - 77 %   Lymphocytes Relative 21  12 - 46 %   Monocytes Relative 9  3 - 12 %   Eosinophils Relative 0  0 - 5 %   Basophils Relative 0  0 - 1 %   Neutro Abs 3.3  1.7 - 7.7 K/uL   Lymphs Abs 1.0  0.7 - 4.0 K/uL   Monocytes Absolute 0.4  0.1 - 1.0 K/uL   Eosinophils Absolute 0.0  0.0 - 0.7 K/uL   Basophils Absolute 0.0  0.0 - 0.1 K/uL   Smear Review PLATELET COUNT CONFIRMED BY SMEAR    ETHANOL      Result Value Ref Range   Alcohol, Ethyl (B) 57 (*) 0 - 11 mg/dL  URINE RAPID DRUG SCREEN (HOSP PERFORMED)      Result Value Ref Range   Opiates NONE DETECTED  NONE DETECTED   Cocaine NONE DETECTED  NONE DETECTED   Benzodiazepines POSITIVE (*) NONE DETECTED   Amphetamines POSITIVE (*) NONE DETECTED   Tetrahydrocannabinol NONE DETECTED  NONE DETECTED   Barbiturates NONE DETECTED  NONE DETECTED    Imaging Review No results found.   EKG  Interpretation None      MDM   Final diagnoses:  Polysubstance abuse    Patient with history of substance abuse. He got pain medications  and alcohol. Patient recently drank shortly before arrival. Alcohol level not significantly elevated. Patient nontoxic no acute distress at this point in time. Patient has been medically cleared. Behavioral health team to see the patient. No evidence of significant withdrawal this time. Patient was treated with Ativan.    Fredia Sorrow, MD 08/19/13 1833  Patient seen by behavioral health. They state that he does meet the criteria for inpatient detox. If the nothing is available in the immediate area will probably move him to the psych observation unit over at [place] until he can be placed. Patient is not a candidate to go back to psych ED due to the fact that he has a cane they're not allowed back in that unit.  Fredia Sorrow, MD 08/20/13 0010

## 2013-08-19 NOTE — ED Notes (Signed)
Patient is here for detox. Patient last drink was 45 mins ago and last took oxy 30mins ago. Patient states he has been taking oxy since Nov 2014 nd ready to get help.

## 2013-08-19 NOTE — ED Notes (Signed)
Pt's belongings wanded by security and placed in locker #26. Pt's wedding band removed and given to his wife.

## 2013-08-20 ENCOUNTER — Encounter (HOSPITAL_COMMUNITY): Payer: Self-pay | Admitting: Psychiatry

## 2013-08-20 DIAGNOSIS — F1120 Opioid dependence, uncomplicated: Secondary | ICD-10-CM | POA: Diagnosis present

## 2013-08-20 DIAGNOSIS — F1010 Alcohol abuse, uncomplicated: Secondary | ICD-10-CM

## 2013-08-20 DIAGNOSIS — F1910 Other psychoactive substance abuse, uncomplicated: Secondary | ICD-10-CM

## 2013-08-20 DIAGNOSIS — F1020 Alcohol dependence, uncomplicated: Secondary | ICD-10-CM | POA: Diagnosis present

## 2013-08-20 LAB — RAPID URINE DRUG SCREEN, HOSP PERFORMED
Amphetamines: POSITIVE — AB
Barbiturates: NOT DETECTED
Benzodiazepines: POSITIVE — AB
Cocaine: NOT DETECTED
Opiates: NOT DETECTED
Tetrahydrocannabinol: NOT DETECTED

## 2013-08-20 MED ORDER — ONDANSETRON 4 MG PO TBDP
4.0000 mg | ORAL_TABLET | Freq: Three times a day (TID) | ORAL | Status: DC | PRN
  Administered 2013-08-20: 4 mg via ORAL
  Filled 2013-08-20: qty 1

## 2013-08-20 MED ORDER — LORAZEPAM 1 MG PO TABS
1.0000 mg | ORAL_TABLET | Freq: Four times a day (QID) | ORAL | Status: DC | PRN
Start: 2013-08-20 — End: 2013-08-20
  Administered 2013-08-20 (×2): 1 mg via ORAL
  Filled 2013-08-20 (×2): qty 1

## 2013-08-20 NOTE — ED Notes (Signed)
Up to bathroom. Ambulates steady with no assist

## 2013-08-20 NOTE — BHH Counselor (Addendum)
Pt is currently doing phone interview w/ Melissa from [facility] [phone].  Arnold Long, [place] Assessment Counselor

## 2013-08-20 NOTE — BH Assessment (Signed)
Mellisa from [facility] called to see if patient was still in need of a bed. Writer confirmed that patient still needed a bed. Melissa given contact information to patient/nurse so that he may complete a phone referral.

## 2013-08-20 NOTE — Consult Note (Signed)
[facility] Consult   Reason for Consult:  Alcohol dependence and opiate abuse Referring Physician:  EDP  Leonard [name] is an 53 y.o. male. Total Time spent with patient: 20 minutes  Assessment: AXIS I:  Alcohol Abuse and Substance Abuse AXIS II:  Deferred AXIS III:  History reviewed. No pertinent past medical history. AXIS IV:  chronic back pain AXIS V:  51-60 moderate symptoms  Plan:  Patient to transfer to [facility] for alcohol/opiate dependency.  Dr.Kumar assessed the patient and concurs with the plan.  Subjective:   Leonard [name] is a 53 y.o. male patient transferred to [facility].  HPI:  Patient had back surgery in the fall of 2014 and it was a failure.  Back pain increased and he started self-medicating with alcohol and pain medications.  He has another surgery scheduled for July 6th and his surgeon wants him to be opiate/alcohol detoxed.  His support system is at his bedside.  Leonard [name] was at [facility] 30 years ago for cocaine dependence, no psychiatric admissions.  Denies suicidal/homicidal ideations and hallucinations. HPI Elements:   Location:  generalized. Quality:  acute. Severity:  moderate. Timing:  constant. Duration:  since his back surgery. Context:  stressors.  Past Psychiatric History: History reviewed. No pertinent past medical history.  reports that he quit smoking about 35 years ago. His smoking use included Cigarettes. He has a 1 pack-year smoking history. He has never used smokeless tobacco. His alcohol and drug histories are not on file. History reviewed. No pertinent family history. Family History Substance Abuse: Yes, Describe: Family Supports: Yes, List: (wife) Living Arrangements: Spouse/significant other Can pt return to current living arrangement?: Yes Abuse/Neglect [facility]) Physical Abuse: Denies Verbal Abuse: Denies Sexual Abuse: Denies Allergies:   Allergies  Allergen Reactions  . Sulfa Antibiotics Swelling and Rash    ACT  Assessment Complete:  Yes:    Educational Status    Risk to Self: Risk to self Suicidal Ideation: No-Not Currently/Within Last 6 Months Suicidal Intent: No Is patient at risk for suicide?: No Suicidal Plan?: No Access to Means: No What has been your use of drugs/alcohol within the last 12 months?:  (etoh and oxycodone) Previous Attempts/Gestures: No How many times?: 0 Other Self Harm Risks: none Triggers for Past Attempts: None known Intentional Self Injurious Behavior: None Family Suicide History: Unknown Recent stressful life event(s): Other (Comment) (Back issues) Persecutory voices/beliefs?: No Depression: Yes Depression Symptoms: Despondent;Insomnia;Tearfulness;Fatigue;Guilt;Feeling worthless/self pity;Feeling angry/irritable Substance abuse history and/or treatment for substance abuse?: Yes Suicide prevention information given to non-admitted patients: Not applicable  Risk to Others: Risk to Others Homicidal Ideation: No Thoughts of Harm to Others: No Current Homicidal Intent: No Current Homicidal Plan: No Access to Homicidal Means: No Identified Victim:  (NA) History of harm to others?: No Assessment of Violence: None Noted Violent Behavior Description:  (Pt calm and cooperative at assessment. ) Does patient have access to weapons?: Yes (Comment) (Pt stated he owns several guns. ) Criminal Charges Pending?: No Does patient have a court date: No  Abuse: Abuse/Neglect Assessment (Assessment to be complete while patient is alone) Physical Abuse: Denies Verbal Abuse: Denies Sexual Abuse: Denies Exploitation of patient/patient's resources: Denies Self-Neglect: Denies  Prior Inpatient Therapy: Prior Inpatient Therapy Prior Inpatient Therapy: Yes Prior Therapy Dates:  (25 years ago) Prior Therapy Facilty/Provider(s):  (unk) Reason for Treatment:  (cocaine detox)  Prior Outpatient Therapy: Prior Outpatient Therapy Prior Outpatient Therapy: Yes Prior Therapy Dates:   (current) Prior Therapy Facilty/Provider(s):  Leonard [name])  Reason for Treatment:  (depression, anxiety)  Additional Information: Additional Information 1:1 In Past 12 Months?: No CIRT Risk: No Elopement Risk: No Does patient have medical clearance?: Yes                  Objective: Blood pressure 156/86, pulse 89, temperature 98.1 F (36.7 C), temperature source Other (Comment), resp. rate 18, SpO2 100.00%.There is no weight on file to calculate BMI. Results for orders placed during the hospital encounter of 08/19/13 (from the past 72 hour(s))  URINE RAPID DRUG SCREEN (HOSP PERFORMED)     Status: Abnormal   Collection Time    08/19/13  5:36 PM      Result Value Ref Range   Opiates NONE DETECTED  NONE DETECTED   Cocaine NONE DETECTED  NONE DETECTED   Benzodiazepines POSITIVE (*) NONE DETECTED   Amphetamines POSITIVE (*) NONE DETECTED   Tetrahydrocannabinol NONE DETECTED  NONE DETECTED   Barbiturates NONE DETECTED  NONE DETECTED   Comment:            DRUG SCREEN FOR MEDICAL PURPOSES     ONLY.  IF CONFIRMATION IS NEEDED     FOR ANY PURPOSE, NOTIFY LAB     WITHIN 5 DAYS.                LOWEST DETECTABLE LIMITS     FOR URINE DRUG SCREEN     Drug Class       Cutoff (ng/mL)     Amphetamine      1000     Barbiturate      200     Benzodiazepine   944     Tricyclics       967     Opiates          300     Cocaine          300     THC              50  COMPREHENSIVE METABOLIC PANEL     Status: Abnormal   Collection Time    08/19/13  5:40 PM      Result Value Ref Range   Sodium 138  137 - 147 mEq/L   Potassium 4.3  3.7 - 5.3 mEq/L   Chloride 98  96 - 112 mEq/L   CO2 24  19 - 32 mEq/L   Glucose, Bld 97  70 - 99 mg/dL   BUN 10  6 - 23 mg/dL   Creatinine, Ser 0.73  0.50 - 1.35 mg/dL   Calcium 9.6  8.4 - 10.5 mg/dL   Total Protein 7.6  6.0 - 8.3 g/dL   Albumin 4.1  3.5 - 5.2 g/dL   AST 143 (*) 0 - 37 U/L   ALT 104 (*) 0 - 53 U/L   Alkaline Phosphatase 64  39 - 117 U/L    Total Bilirubin 0.8  0.3 - 1.2 mg/dL   GFR calc non Af Amer >90  >90 mL/min   GFR calc Af Amer >90  >90 mL/min   Comment: (NOTE)     The eGFR has been calculated using the CKD EPI equation.     This calculation has not been validated in all clinical situations.     eGFR's persistently <90 mL/min signify possible Chronic Kidney     Disease.  LIPASE, BLOOD     Status: Abnormal   Collection Time    08/19/13  5:40 PM  Result Value Ref Range   Lipase 66 (*) 11 - 59 U/L  CBC WITH DIFFERENTIAL     Status: Abnormal   Collection Time    08/19/13  5:40 PM      Result Value Ref Range   WBC 4.7  4.0 - 10.5 K/uL   RBC 4.68  4.22 - 5.81 MIL/uL   Hemoglobin 16.3  13.0 - 17.0 g/dL   HCT 45.7  39.0 - 52.0 %   MCV 97.6  78.0 - 100.0 fL   MCH 34.8 (*) 26.0 - 34.0 pg   MCHC 35.7  30.0 - 36.0 g/dL   RDW 13.0  11.5 - 15.5 %   Platelets 108 (*) 150 - 400 K/uL   Comment: RESULT REPEATED AND VERIFIED     SPECIMEN CHECKED FOR CLOTS   Neutrophils Relative % 70  43 - 77 %   Lymphocytes Relative 21  12 - 46 %   Monocytes Relative 9  3 - 12 %   Eosinophils Relative 0  0 - 5 %   Basophils Relative 0  0 - 1 %   Neutro Abs 3.3  1.7 - 7.7 K/uL   Lymphs Abs 1.0  0.7 - 4.0 K/uL   Monocytes Absolute 0.4  0.1 - 1.0 K/uL   Eosinophils Absolute 0.0  0.0 - 0.7 K/uL   Basophils Absolute 0.0  0.0 - 0.1 K/uL   Smear Review PLATELET COUNT CONFIRMED BY SMEAR    ETHANOL     Status: Abnormal   Collection Time    08/19/13  5:40 PM      Result Value Ref Range   Alcohol, Ethyl (B) 57 (*) 0 - 11 mg/dL   Comment:            LOWEST DETECTABLE LIMIT FOR     SERUM ALCOHOL IS 11 mg/dL     FOR MEDICAL PURPOSES ONLY  URINE RAPID DRUG SCREEN (HOSP PERFORMED)     Status: Abnormal   Collection Time    08/20/13 10:10 AM      Result Value Ref Range   Opiates NONE DETECTED  NONE DETECTED   Cocaine NONE DETECTED  NONE DETECTED   Benzodiazepines POSITIVE (*) NONE DETECTED   Amphetamines POSITIVE (*) NONE DETECTED    Tetrahydrocannabinol NONE DETECTED  NONE DETECTED   Barbiturates NONE DETECTED  NONE DETECTED   Comment:            DRUG SCREEN FOR MEDICAL PURPOSES     ONLY.  IF CONFIRMATION IS NEEDED     FOR ANY PURPOSE, NOTIFY LAB     WITHIN 5 DAYS.                LOWEST DETECTABLE LIMITS     FOR URINE DRUG SCREEN     Drug Class       Cutoff (ng/mL)     Amphetamine      1000     Barbiturate      200     Benzodiazepine   277     Tricyclics       412     Opiates          300     Cocaine          300     THC              50   Labs are reviewed and are pertinent for no medical issues noted.  Current Facility-Administered Medications  Medication Dose  Route Frequency Provider Last Rate Last Dose  . 0.9 %  sodium chloride infusion   Intravenous Continuous Fredia Sorrow, MD      . LORazepam (ATIVAN) tablet 1 mg  1 mg Oral Q6H PRN Fredia Sorrow, MD   1 mg at 08/20/13 0819  . ondansetron (ZOFRAN-ODT) disintegrating tablet 4 mg  4 mg Oral Q8H PRN Fredia Sorrow, MD   4 mg at 08/20/13 5670   Current Outpatient Prescriptions  Medication Sig Dispense Refill  . oxycodone (OXY-IR) 5 MG capsule Take 10 mg by mouth every 4 (four) hours as needed for pain.      Marland Kitchen sertraline (ZOLOFT) 50 MG tablet Take 50 mg by mouth 2 (two) times daily.      . traZODone (DESYREL) 100 MG tablet Take 100-300 mg by mouth at bedtime. Sleep disturbance        Psychiatric Specialty Exam:     Blood pressure 156/86, pulse 89, temperature 98.1 F (36.7 C), temperature source Other (Comment), resp. rate 18, SpO2 100.00%.There is no weight on file to calculate BMI.  General Appearance: Casual  Eye Contact::  Good  Speech:  Normal Rate  Volume:  Normal  Mood:  Anxious  Affect:  Congruent  Thought Process:  Coherent  Orientation:  Full (Time, Place, and Person)  Thought Content:  WDL  Suicidal Thoughts:  No  Homicidal Thoughts:  No  Memory:  Immediate;   Fair Recent;   Fair Remote;   Fair  Judgement:  Fair  Insight:   Fair  Psychomotor Activity:  Decreased  Concentration:  Fair  Recall:  [employer] of Knowledge:Fair  Language: Fair  Akathisia:  No  Handed:  Right  AIMS (if indicated):     Assets:  Desire for Improvement Financial Resources/Insurance Housing Leisure Time Resilience Social Support Transportation  Sleep:      Musculoskeletal: Strength & Muscle Tone: within normal limits Gait & Station: normal Patient leans: N/A  Treatment Plan Summary: Admit to [facility] for detox/rehab and follow-up with AA/NA.  Waylan Boga, [place] 08/20/2013 3:12 PM

## 2013-08-20 NOTE — ED Notes (Signed)
Charge RN spoke with [facility], pt waiting placement

## 2013-08-20 NOTE — ED Notes (Signed)
Pt discharge with wife and has been accepted to [facility]

## 2013-08-20 NOTE — ED Notes (Signed)
Bed: OM85 Expected date:  Expected time:  Means of arrival:  Comments: Hold for room 7

## 2013-08-20 NOTE — Progress Notes (Signed)
Per Melissa patient accepted to [facility] for detox. Patient wife plans to transport patient to [facility]. No further Clinical Social Work needs, signing off.    Noreene Larsson [phone]  ED CSW 08/20/2013 1230pm

## 2013-08-21 ENCOUNTER — Encounter (HOSPITAL_COMMUNITY): Payer: Self-pay | Admitting: Psychiatry

## 2013-08-21 NOTE — Consult Note (Signed)
Patient seen, evaluated by me. Patient needs detox as he has a surgery scheduled on July 6. Patient is transferred to [facility]

## 2013-08-23 DIAGNOSIS — F10231 Alcohol dependence with withdrawal delirium: Secondary | ICD-10-CM | POA: Insufficient documentation

## 2013-08-23 DIAGNOSIS — N390 Urinary tract infection, site not specified: Secondary | ICD-10-CM | POA: Insufficient documentation

## 2013-08-23 DIAGNOSIS — E876 Hypokalemia: Secondary | ICD-10-CM | POA: Insufficient documentation

## 2013-08-23 DIAGNOSIS — F10931 Alcohol use, unspecified with withdrawal delirium: Secondary | ICD-10-CM | POA: Insufficient documentation

## 2013-08-26 DIAGNOSIS — I10 Essential (primary) hypertension: Secondary | ICD-10-CM | POA: Insufficient documentation

## 2013-08-31 ENCOUNTER — Other Ambulatory Visit (HOSPITAL_COMMUNITY): Payer: PRIVATE HEALTH INSURANCE

## 2013-09-03 ENCOUNTER — Encounter (HOSPITAL_COMMUNITY)
Admission: RE | Admit: 2013-09-03 | Discharge: 2013-09-03 | Disposition: A | Discharge status: Home / Self Care | Payer: PRIVATE HEALTH INSURANCE | Source: Ambulatory Visit | Attending: Neurosurgery | Admitting: Neurosurgery

## 2013-09-03 ENCOUNTER — Encounter (HOSPITAL_COMMUNITY): Payer: Self-pay

## 2013-09-03 HISTORY — DX: Other specified postprocedural states: Z98.890

## 2013-09-03 HISTORY — DX: Dizziness and giddiness: R42

## 2013-09-03 HISTORY — DX: Hyperlipidemia, unspecified: E78.5

## 2013-09-03 HISTORY — DX: Nausea with vomiting, unspecified: R11.2

## 2013-09-03 HISTORY — DX: Weakness: R53.1

## 2013-09-03 HISTORY — DX: Insomnia, unspecified: G47.00

## 2013-09-03 LAB — CBC
HCT: 46.8 % (ref 39.0–52.0)
Hemoglobin: 16.2 g/dL (ref 13.0–17.0)
MCH: 34 pg (ref 26.0–34.0)
MCHC: 34.6 g/dL (ref 30.0–36.0)
MCV: 98.1 fL (ref 78.0–100.0)
Platelets: 284 10*3/uL (ref 150–400)
RBC: 4.77 MIL/uL (ref 4.22–5.81)
RDW: 12.6 % (ref 11.5–15.5)
WBC: 7.5 10*3/uL (ref 4.0–10.5)

## 2013-09-03 LAB — BASIC METABOLIC PANEL
BUN: 9 mg/dL (ref 6–23)
CO2: 24 mEq/L (ref 19–32)
Calcium: 9.8 mg/dL (ref 8.4–10.5)
Chloride: 102 mEq/L (ref 96–112)
Creatinine, Ser: 1.06 mg/dL (ref 0.50–1.35)
GFR calc Af Amer: >90 mL/min (ref >90)
GFR calc non Af Amer: 78 mL/min — ABNORMAL LOW (ref >90)
Glucose, Bld: 89 mg/dL (ref 70–99)
Potassium: 4 mEq/L (ref 3.7–5.3)
Sodium: 140 mEq/L (ref 137–147)

## 2013-09-03 LAB — TYPE AND SCREEN
ABO/RH(D): A POS
Antibody Screen: NEGATIVE

## 2013-09-03 LAB — SURGICAL PCR SCREEN
MRSA, PCR: NEGATIVE
Staphylococcus aureus: POSITIVE — AB

## 2013-09-03 NOTE — Progress Notes (Addendum)
  Pt doesn't have a cardiologist  Denies ever having an echo/stress test/heart cath  Denies EKG or CXR in past yr   Medical Md is Dr.Robert Technical brewer

## 2013-09-03 NOTE — Pre-Procedure Instructions (Signed)
Leonard [name]  09/03/2013   Your procedure is scheduled on:  Mon, July 6 @ 10:00 AM  Report to [facility]  at 7:00 AM.  Call this number if you have problems the morning of surgery: [phone]   Remember:   Do not eat food or drink liquids after midnight.   Take these medicines the morning of surgery with A SIP OF WATER: Zoloft(Sertraline)               No Goody's,BC's,Aleve,Aspirin,Ibuprofen,Fish Oil,or any Herbal Medications   Do not wear jewelry  Do not wear lotions, powders, or colognes. You may wear deodorant.  Men may shave face and neck.  Do not bring valuables to the hospital.  [facility] is not responsible                  for any belongings or valuables.               Contacts, dentures or bridgework may not be worn into surgery.  Leave suitcase in the car. After surgery it may be brought to your room.  For patients admitted to the hospital, discharge time is determined by your                treatment team.                 Special Instructions:  [place] - Preparing for Surgery  Before surgery, you can play an important role.  Because skin is not sterile, your skin needs to be as free of germs as possible.  You can reduce the number of germs on you skin by washing with CHG (chlorahexidine gluconate) soap before surgery.  CHG is an antiseptic cleaner which kills germs and bonds with the skin to continue killing germs even after washing.  Please DO NOT use if you have an allergy to CHG or antibacterial soaps.  If your skin becomes reddened/irritated stop using the CHG and inform your nurse when you arrive at Short Stay.  Do not shave (including legs and underarms) for at least 48 hours prior to the first CHG shower.  You may shave your face.  Please follow these instructions carefully:   1.  Shower with CHG Soap the night before surgery and the                                morning of Surgery.  2.  If you choose to wash your hair, wash your hair first  as usual with your       normal shampoo.  3.  After you shampoo, rinse your hair and body thoroughly to remove the                      Shampoo.  4.  Use CHG as you would any other liquid soap.  You can apply chg directly       to the skin and wash gently with scrungie or a clean washcloth.  5.  Apply the CHG Soap to your body ONLY FROM THE NECK DOWN.        Do not use on open wounds or open sores.  Avoid contact with your eyes,       ears, mouth and genitals (private parts).  Wash genitals (private parts)       with your normal soap.  6.  Wash thoroughly, paying special attention  to the area where your surgery        will be performed.  7.  Thoroughly rinse your body with warm water from the neck down.  8.  DO NOT shower/wash with your normal soap after using and rinsing off       the CHG Soap.  9.  Pat yourself dry with a clean towel.            10.  Wear clean pajamas.            11.  Place clean sheets on your bed the night of your first shower and do not        sleep with pets.  Day of Surgery  Do not apply any lotions/deoderants the morning of surgery.  Please wear clean clothes to the hospital/surgery center.     Please read over the following fact sheets that you were given: Pain Booklet, Coughing and Deep Breathing, Blood Transfusion Information, MRSA Information and Surgical Site Infection Prevention

## 2013-09-03 NOTE — Progress Notes (Signed)
Mupirocin called into [facility]

## 2013-09-04 ENCOUNTER — Ambulatory Visit (HOSPITAL_COMMUNITY)
Admission: RE | Admit: 2013-09-04 | Discharge: 2013-09-04 | Disposition: A | Discharge status: Home / Self Care | Payer: PRIVATE HEALTH INSURANCE | Source: Ambulatory Visit | Attending: Vascular Surgery | Admitting: Vascular Surgery

## 2013-09-04 DIAGNOSIS — Z0189 Encounter for other specified special examinations: Secondary | ICD-10-CM | POA: Insufficient documentation

## 2013-09-04 DIAGNOSIS — F1120 Opioid dependence, uncomplicated: Secondary | ICD-10-CM | POA: Insufficient documentation

## 2013-09-04 DIAGNOSIS — F1020 Alcohol dependence, uncomplicated: Secondary | ICD-10-CM | POA: Insufficient documentation

## 2013-09-04 LAB — HEPATIC FUNCTION PANEL
ALT: 41 U/L (ref 0–53)
AST: 55 U/L — ABNORMAL HIGH (ref 0–37)
Albumin: 3.7 g/dL (ref 3.5–5.2)
Alkaline Phosphatase: 92 U/L (ref 39–117)
Bilirubin, Direct: <0.2 mg/dL (ref 0.0–0.3)
Total Bilirubin: 0.5 mg/dL (ref 0.3–1.2)
Total Protein: 7.7 g/dL (ref 6.0–8.3)

## 2013-09-04 LAB — ABO/RH: ABO/RH(D): A POS

## 2013-09-04 NOTE — Progress Notes (Addendum)
Anesthesia Chart Review:  Patient is a 53 year old male scheduled for L4-5 decompression with PLIF on 09/10/13 by Dr. Sherwood Gambler.  History includes non-smoker, post-operative N/V, anxiety, depression, HLD, GERD, dizziness, chronic back pain, back surgery, cervical fusion, appendectomy.  08/19/13 notes in Epic indicate that he was admitted for preoperative ETOH/opiate dependency. History indicates that he was drinking 14 bottles of wine per week, but no ETOH in the past two weeks. Notes indicate that he was treated for cocaine dependence 30 years ago.  No current illicit drug [place] reported.   PCP is Dr. Gaynelle Arabian.  EKG on 09/03/13 showed: NSR, possible LAE, anterior infarct (age undetermined), T wave abnormality, consider inferolateral ischemia.  T wave abnormality is new since 12/27/00 (see Muse). There are no prior EKGs at Dr. Andrew Au office. Patient denied prior stress, echo, and cath.   Preoperative labs noted. HFP added due to ETOH history with elevated LFTs in the past. AST down to 55 (from 143 on 08/19/13) and ALT now normal at 41.   I reviewed above with anesthesiologist Dr. Orene Desanctis who agrees that with new abnormal EKG and history of ETOH dependence that he should get a preoperative cardiology evaluation.  Manuela Schwartz at Dr. Donnella Bi office notified.   George Hugh [facility] Short Stay Center/Anesthesiology Phone [phone] 09/04/2013 3:42 PM  Addendum: 09/06/2013 5:37 PM Patient was seen by cardiologist Dr. Quay Burow on 09/05/13 for preoperative evaluation.  A stress test has been ordered and done today showing: Overall Impression: Low risk stress nuclear study with small fixed septal and inferoapical defects which are likely artifact. No reversible ischemia. LV Wall Motion: NL LV Function; NL Wall Motion; EF 56%. Patient was subsequently cleared for surgery by Dr. Kennon Holter partner Dr. Debara Pickett.

## 2013-09-05 ENCOUNTER — Encounter: Payer: Self-pay | Admitting: Cardiovascular Disease

## 2013-09-05 ENCOUNTER — Ambulatory Visit (INDEPENDENT_AMBULATORY_CARE_PROVIDER_SITE_OTHER): Payer: PRIVATE HEALTH INSURANCE | Admitting: Cardiovascular Disease

## 2013-09-05 VITALS — BP 132/90 | HR 94 | Ht 70.0 in | Wt 179.0 lb

## 2013-09-05 DIAGNOSIS — R9431 Abnormal electrocardiogram [ECG] [EKG]: Secondary | ICD-10-CM

## 2013-09-05 DIAGNOSIS — E785 Hyperlipidemia, unspecified: Secondary | ICD-10-CM | POA: Insufficient documentation

## 2013-09-05 DIAGNOSIS — Z01818 Encounter for other preprocedural examination: Secondary | ICD-10-CM

## 2013-09-05 NOTE — Assessment & Plan Note (Signed)
The patient is sent here by Dr. Sherwood Gambler and anesthesia for preoperative clearance before elective decompressive lumbar laminectomy. He had an abnormal EKG on preoperative workup. His only factors include hyperlipidemia. He has never had a heart attack or stroke, denies chest pain or shortness of breath. His EKG shows anterolateral ischemia. Based on this and the fact that he cannot exercise because of his back pain, and going to get a pharmacologic Myoview stress test to risk stratify him.

## 2013-09-05 NOTE — Progress Notes (Signed)
09/05/2013 Leonard [name]   Jul 25, 1960  [id number]  Primary Physician Simona Huh, MD Primary Cardiologist: Lorretta Harp MD Renae Gloss   HPI:  Mr. Leonard [name] is a 53 year old gentleman referred preoperative clearance before decompressive laminectomy. He really has no cardiac risk factors but an EKG showed anterolateral ischemia. He denies chest pain or shortness of breath.   Current Outpatient Prescriptions  Medication Sig Dispense Refill  . gabapentin (NEURONTIN) 100 MG capsule Take 100 mg by mouth 3 (three) times daily.      Marland Kitchen oxyCODONE (ROXICODONE) 15 MG immediate release tablet Take 10 mg by mouth every 4 (four) hours as needed for pain.      Marland Kitchen sertraline (ZOLOFT) 50 MG tablet Take 50 mg by mouth 2 (two) times daily.      . traZODone (DESYREL) 100 MG tablet Take 100-300 mg by mouth at bedtime. Sleep disturbance       No current facility-administered medications for this visit.    Allergies  Allergen Reactions  . Sulfa Antibiotics Swelling and Rash    History   Social History  . Marital Status: Married    Spouse Name: N/A    Number of Children: N/A  . Years of Education: N/A   Occupational History  . Not on file.   Social History Main Topics  . Smoking status: Former Smoker -- 0.50 packs/day for 2 years    Types: Cigarettes  . Smokeless tobacco: Never Used     Comment: smoked while he was in high school  . Alcohol Use: Yes     Comment: 14 bottles of wine a week ;nothing in 2wks  . Drug Use: No  . Sexual Activity: Yes   Other Topics Concern  . Not on file   Social History Narrative   ** Merged History Encounter **         Review of Systems: General: negative for chills, fever, night sweats or weight changes.  Cardiovascular: negative for chest pain, dyspnea on exertion, edema, orthopnea, palpitations, paroxysmal nocturnal dyspnea or shortness of breath Dermatological: negative for rash Respiratory: negative for cough or  wheezing Urologic: negative for hematuria Abdominal: negative for nausea, vomiting, diarrhea, bright red blood per rectum, melena, or hematemesis Neurologic: negative for visual changes, syncope, or dizziness All other systems reviewed and are otherwise negative except as noted above.    Blood pressure 132/90, pulse 94, height 5\' 10"  (1.778 m), weight 179 lb (81.194 kg).  General appearance: alert and no distress Neck: no adenopathy, no carotid bruit, no JVD, supple, symmetrical, trachea midline and thyroid not enlarged, symmetric, no tenderness/mass/nodules Lungs: clear to auscultation bilaterally Heart: regular rate and rhythm, S1, S2 normal, no murmur, click, rub or gallop Extremities: extremities normal, atraumatic, no cyanosis or edema  EKG sinus rhythm with anterolateral T wave inversion  ASSESSMENT AND PLAN:   Hyperlipidemia He has been on statin therapy in the past but this was stopped because of elevated liver function tests  Abnormal EKG The patient is sent here by Dr. Sherwood Gambler and anesthesia for preoperative clearance before elective decompressive lumbar laminectomy. He had an abnormal EKG on preoperative workup. His only factors include hyperlipidemia. He has never had a heart attack or stroke, denies chest pain or shortness of breath. His EKG shows anterolateral ischemia. Based on this and the fact that he cannot exercise because of his back pain, and going to get a pharmacologic Myoview stress test to risk stratify him.      Pearletha Forge.  Gwenlyn Found MD [facility], [facility] 09/05/2013 4:58 PM

## 2013-09-05 NOTE — Assessment & Plan Note (Signed)
He has been on statin therapy in the past but this was stopped because of elevated liver function tests

## 2013-09-05 NOTE — Patient Instructions (Signed)
  We will see you back in follow up as needed.   Dr Berry has ordered: Lexiscan Myoview- this is a test that looks at the blood flow to your heart muscle.  It takes approximately 2 1/2 hours. Please follow instruction sheet, as given.      

## 2013-09-06 ENCOUNTER — Ambulatory Visit (HOSPITAL_COMMUNITY)
Admission: RE | Admit: 2013-09-06 | Discharge: 2013-09-06 | Disposition: A | Discharge status: Home / Self Care | Payer: PRIVATE HEALTH INSURANCE | Source: Ambulatory Visit | Attending: Internal Medicine | Admitting: Internal Medicine

## 2013-09-06 ENCOUNTER — Telehealth: Payer: Self-pay | Admitting: Cardiovascular Disease

## 2013-09-06 DIAGNOSIS — Z87891 Personal history of nicotine dependence: Secondary | ICD-10-CM | POA: Insufficient documentation

## 2013-09-06 DIAGNOSIS — R5381 Other malaise: Secondary | ICD-10-CM | POA: Insufficient documentation

## 2013-09-06 DIAGNOSIS — R9431 Abnormal electrocardiogram [ECG] [EKG]: Secondary | ICD-10-CM

## 2013-09-06 DIAGNOSIS — Z01812 Encounter for preprocedural laboratory examination: Secondary | ICD-10-CM | POA: Insufficient documentation

## 2013-09-06 DIAGNOSIS — R002 Palpitations: Secondary | ICD-10-CM | POA: Insufficient documentation

## 2013-09-06 DIAGNOSIS — Z01818 Encounter for other preprocedural examination: Secondary | ICD-10-CM

## 2013-09-06 DIAGNOSIS — Z01810 Encounter for preprocedural cardiovascular examination: Secondary | ICD-10-CM | POA: Insufficient documentation

## 2013-09-06 DIAGNOSIS — R5383 Other fatigue: Principal | ICD-10-CM

## 2013-09-06 MED ORDER — REGADENOSON 0.4 MG/5ML IV SOLN
0.4000 mg | Freq: Once | INTRAVENOUS | Status: AC
  Administered 2013-09-06: 0.4 mg via INTRAVENOUS

## 2013-09-06 MED ORDER — TECHNETIUM TC 99M SESTAMIBI GENERIC - CARDIOLITE
31.2000 | Freq: Once | INTRAVENOUS | Status: AC | PRN
  Administered 2013-09-06: 31.2 via INTRAVENOUS

## 2013-09-06 MED ORDER — TECHNETIUM TC 99M SESTAMIBI GENERIC - CARDIOLITE
10.9000 | Freq: Once | INTRAVENOUS | Status: AC | PRN
  Administered 2013-09-06: 10.9 via INTRAVENOUS

## 2013-09-06 NOTE — Telephone Encounter (Signed)
Dr Debara Pickett read the study and cleared patient for surgery.  I spoke with Manuela Schwartz and let her know and I spoke with patient's wife.  I also faxed clearance letter to Dr Donnella Bi office.

## 2013-09-06 NOTE — Procedures (Addendum)
[name] [address] [place] Leonard [name] [address] [phone]  Cardiology Nuclear Med Study  Leonard [name] is a 53 y.o. male     MRN : [id number]     DOB: 13-Aug-1960  Procedure Date: 09/06/2013  Nuclear Med Background Indication for Stress Test:  Surgical Clearance, [name] and Abnormal EKG History:  No prior cardiac or respiratory history reported. No prior NUC MPI for comparison. Cardiac Risk Factors: History of Smoking and Lipids  Symptoms:  Fatigue and Palpitations   Nuclear Pre-Procedure Caffeine/Decaff Intake:  11:00pm NPO After: 9:00am   IV Site: R Forearm  IV 0.9% NS with Angio Cath:  22g  Chest Size (in):  48"  IV Started by: Leonard Course, RN  Height: 5\' 10"  (1.778 m)  Cup Size: n/a  BMI:  Body mass index is 25.68 kg/(m^2). Weight:  179 lb (81.194 kg)   Tech Comments:  n/a    Nuclear Med Study 1 or 2 day study: 1 day  Stress Test Type:  [place] Provider:  Abelardo Diesel, MD   Resting Radionuclide: Technetium 59m Sestamibi  Resting Radionuclide Dose: 10.9 mCi   Stress Radionuclide:  Technetium 50m Sestamibi  Stress Radionuclide Dose: 31.2 mCi           Stress Protocol Rest HR:77 Stress HR: 97  Rest BP: 119/95 Stress BP: 133/89  Exercise Time (min): n/a METS: n/a   Predicted Max HR: 166 bpm % Max HR: 61.45 bpm Rate Pressure Product: 13566  Dose of Adenosine (mg):  n/a Dose of Lexiscan: 0.4 mg  Dose of Atropine (mg): n/a Dose of Dobutamine: n/a mcg/kg/min (at max HR)  Stress Test Technologist: Leane Para, CCT Nuclear Technologist: Imagene Riches, CNMT   Rest Procedure:  Myocardial perfusion imaging was performed at rest 45 minutes following the intravenous administration of Technetium 30m Sestamibi. Stress Procedure:  The patient received IV Lexiscan 0.4 mg over 15-seconds.  Technetium 12m Sestamibi injected at 30-seconds.  There were no significant changes with Lexiscan.   Quantitative spect images were obtained after a 45 minute delay.  Transient Ischemic Dilatation (Normal <1.22):  1.12  QGS EDV:  69 ml QGS ESV:  30 ml LV Ejection Fraction: 56%  Rest ECG: NSR with non-specific ST-T wave changes  Stress ECG: No significant change from baseline ECG  QPS Raw Data Images:  Normal; no motion artifact; normal heart/lung ratio. Stress Images:  Septal and inferoapical defects Rest Images:  Septal and inferoapical defects Subtraction (SDS):  No evidence of ischemia.  Impression Exercise Capacity:  Lexiscan with no exercise. BP Response:  Normal blood pressure response. Clinical Symptoms:  No significant symptoms noted. ECG Impression:  No significant ECG changes with Lexiscan. Comparison with Prior Nuclear Study: No previous nuclear study performed  Overall Impression:  Low risk stress nuclear study with small fixed septal and inferoapical defects which are likely artifact. No reversible ischemia.  LV Wall Motion:  NL LV Function; NL Wall Motion; EF 56%  Pixie Casino, MD, [facility] Board Certified in Nuclear Cardiology Attending Cardiologist [place], MD  09/06/2013 4:31 PM

## 2013-09-06 NOTE — Telephone Encounter (Signed)
She called to give you Manuela Schwartz Dr [facility] surgery scheduler celll phone number, in case you can not get the office. The cell phone number [phone].

## 2013-09-09 MED ORDER — CEFAZOLIN SODIUM-DEXTROSE 2-3 GM-% IV SOLR
2.0000 g | INTRAVENOUS | Status: AC
  Administered 2013-09-10 (×2): 2 g via INTRAVENOUS
  Filled 2013-09-09: qty 50

## 2013-09-10 ENCOUNTER — Encounter (HOSPITAL_COMMUNITY)
Admission: RE | Disposition: A | Discharge status: Home / Self Care | Payer: PRIVATE HEALTH INSURANCE | Source: Ambulatory Visit | Attending: Neurosurgery

## 2013-09-10 ENCOUNTER — Inpatient Hospital Stay (HOSPITAL_COMMUNITY): Payer: PRIVATE HEALTH INSURANCE

## 2013-09-10 ENCOUNTER — Inpatient Hospital Stay (HOSPITAL_COMMUNITY)
Admission: RE | Admit: 2013-09-10 | Discharge: 2013-09-11 | DRG: 460 | Disposition: A | Discharge status: Home / Self Care | Payer: PRIVATE HEALTH INSURANCE | Source: Ambulatory Visit | Attending: Neurosurgery | Admitting: Neurosurgery

## 2013-09-10 ENCOUNTER — Encounter (HOSPITAL_COMMUNITY): Payer: Self-pay | Admitting: *Deleted

## 2013-09-10 ENCOUNTER — Encounter (HOSPITAL_COMMUNITY): Payer: PRIVATE HEALTH INSURANCE | Admitting: Vascular Surgery

## 2013-09-10 ENCOUNTER — Inpatient Hospital Stay (HOSPITAL_COMMUNITY): Payer: PRIVATE HEALTH INSURANCE | Admitting: Certified Registered"

## 2013-09-10 DIAGNOSIS — F3289 Other specified depressive episodes: Secondary | ICD-10-CM | POA: Diagnosis present

## 2013-09-10 DIAGNOSIS — M48062 Spinal stenosis, lumbar region with neurogenic claudication: Secondary | ICD-10-CM | POA: Diagnosis present

## 2013-09-10 DIAGNOSIS — Q762 Congenital spondylolisthesis: Secondary | ICD-10-CM

## 2013-09-10 DIAGNOSIS — F1120 Opioid dependence, uncomplicated: Secondary | ICD-10-CM | POA: Diagnosis present

## 2013-09-10 DIAGNOSIS — F329 Major depressive disorder, single episode, unspecified: Secondary | ICD-10-CM | POA: Diagnosis present

## 2013-09-10 DIAGNOSIS — F411 Generalized anxiety disorder: Secondary | ICD-10-CM | POA: Diagnosis present

## 2013-09-10 DIAGNOSIS — M47817 Spondylosis without myelopathy or radiculopathy, lumbosacral region: Secondary | ICD-10-CM | POA: Diagnosis present

## 2013-09-10 DIAGNOSIS — F1020 Alcohol dependence, uncomplicated: Secondary | ICD-10-CM | POA: Diagnosis present

## 2013-09-10 DIAGNOSIS — E785 Hyperlipidemia, unspecified: Secondary | ICD-10-CM | POA: Diagnosis present

## 2013-09-10 DIAGNOSIS — G8929 Other chronic pain: Secondary | ICD-10-CM | POA: Diagnosis present

## 2013-09-10 DIAGNOSIS — Z79899 Other long term (current) drug therapy: Secondary | ICD-10-CM

## 2013-09-10 DIAGNOSIS — Z9089 Acquired absence of other organs: Secondary | ICD-10-CM

## 2013-09-10 DIAGNOSIS — G4700 Insomnia, unspecified: Secondary | ICD-10-CM | POA: Diagnosis present

## 2013-09-10 DIAGNOSIS — M4316 Spondylolisthesis, lumbar region: Secondary | ICD-10-CM | POA: Diagnosis present

## 2013-09-10 DIAGNOSIS — Z882 Allergy status to sulfonamides status: Secondary | ICD-10-CM

## 2013-09-10 DIAGNOSIS — Z981 Arthrodesis status: Secondary | ICD-10-CM

## 2013-09-10 DIAGNOSIS — M51379 Other intervertebral disc degeneration, lumbosacral region without mention of lumbar back pain or lower extremity pain: Principal | ICD-10-CM | POA: Diagnosis present

## 2013-09-10 DIAGNOSIS — K219 Gastro-esophageal reflux disease without esophagitis: Secondary | ICD-10-CM | POA: Diagnosis present

## 2013-09-10 DIAGNOSIS — Z87891 Personal history of nicotine dependence: Secondary | ICD-10-CM

## 2013-09-10 DIAGNOSIS — M5137 Other intervertebral disc degeneration, lumbosacral region: Principal | ICD-10-CM | POA: Diagnosis present

## 2013-09-10 SURGERY — POSTERIOR LUMBAR FUSION 1 LEVEL
Anesthesia: General | Site: Spine Lumbar

## 2013-09-10 MED ORDER — FENTANYL CITRATE 0.05 MG/ML IJ SOLN
INTRAMUSCULAR | Status: AC
  Filled 2013-09-10: qty 5

## 2013-09-10 MED ORDER — LACTATED RINGERS IV SOLN
INTRAVENOUS | Status: DC
  Administered 2013-09-10: 08:00:00 via INTRAVENOUS

## 2013-09-10 MED ORDER — TRAZODONE HCL 100 MG PO TABS
100.0000 mg | ORAL_TABLET | Freq: Every day | ORAL | Status: DC
  Administered 2013-09-10: 200 mg via ORAL
  Filled 2013-09-10 (×2): qty 3

## 2013-09-10 MED ORDER — THROMBIN 20000 UNITS EX SOLR
CUTANEOUS | Status: DC | PRN
  Administered 2013-09-10: 11:00:00 via TOPICAL

## 2013-09-10 MED ORDER — HYDROCODONE-ACETAMINOPHEN 5-325 MG PO TABS: 1.0000 | ORAL_TABLET | ORAL | Status: DC | PRN

## 2013-09-10 MED ORDER — KETOROLAC TROMETHAMINE 30 MG/ML IJ SOLN
30.0000 mg | Freq: Once | INTRAMUSCULAR | Status: AC
  Administered 2013-09-10: 30 mg via INTRAVENOUS

## 2013-09-10 MED ORDER — ACETAMINOPHEN 10 MG/ML IV SOLN: INTRAVENOUS | Status: DC | PRN

## 2013-09-10 MED ORDER — HYDROMORPHONE HCL PF 1 MG/ML IJ SOLN
INTRAMUSCULAR | Status: AC
  Filled 2013-09-10: qty 1

## 2013-09-10 MED ORDER — PROPOFOL 10 MG/ML IV BOLUS
INTRAVENOUS | Status: AC
  Filled 2013-09-10: qty 20

## 2013-09-10 MED ORDER — BISACODYL 10 MG RE SUPP: 10.0000 mg | Freq: Every day | RECTAL | Status: DC | PRN

## 2013-09-10 MED ORDER — MENTHOL 3 MG MT LOZG: 1.0000 | LOZENGE | OROMUCOSAL | Status: DC | PRN

## 2013-09-10 MED ORDER — MORPHINE SULFATE 4 MG/ML IJ SOLN: 4.0000 mg | INTRAMUSCULAR | Status: DC | PRN

## 2013-09-10 MED ORDER — ACETAMINOPHEN 10 MG/ML IV SOLN
INTRAVENOUS | Status: DC | PRN
  Administered 2013-09-10: 1000 mg via INTRAVENOUS

## 2013-09-10 MED ORDER — GLYCOPYRROLATE 0.2 MG/ML IJ SOLN
INTRAMUSCULAR | Status: DC | PRN
  Administered 2013-09-10: 0.6 mg via INTRAVENOUS

## 2013-09-10 MED ORDER — PHENOL 1.4 % MT LIQD: 1.0000 | OROMUCOSAL | Status: DC | PRN

## 2013-09-10 MED ORDER — SERTRALINE HCL 50 MG PO TABS
50.0000 mg | ORAL_TABLET | Freq: Two times a day (BID) | ORAL | Status: DC
  Administered 2013-09-10 – 2013-09-11 (×2): 50 mg via ORAL
  Filled 2013-09-10 (×4): qty 1

## 2013-09-10 MED ORDER — FENTANYL CITRATE 0.05 MG/ML IJ SOLN
INTRAMUSCULAR | Status: DC | PRN
  Administered 2013-09-10 (×5): 50 ug via INTRAVENOUS

## 2013-09-10 MED ORDER — CEFAZOLIN SODIUM-DEXTROSE 2-3 GM-% IV SOLR
INTRAVENOUS | Status: AC
  Filled 2013-09-10: qty 50

## 2013-09-10 MED ORDER — ALUM & MAG HYDROXIDE-SIMETH 200-200-20 MG/5ML PO SUSP: 30.0000 mL | Freq: Four times a day (QID) | ORAL | Status: DC | PRN

## 2013-09-10 MED ORDER — MAGNESIUM HYDROXIDE 400 MG/5ML PO SUSP: 30.0000 mL | Freq: Every day | ORAL | Status: DC | PRN

## 2013-09-10 MED ORDER — ONDANSETRON HCL 4 MG/2ML IJ SOLN: 4.0000 mg | Freq: Four times a day (QID) | INTRAMUSCULAR | Status: DC | PRN

## 2013-09-10 MED ORDER — GLYCOPYRROLATE 0.2 MG/ML IJ SOLN
INTRAMUSCULAR | Status: AC
  Filled 2013-09-10: qty 3

## 2013-09-10 MED ORDER — ROCURONIUM BROMIDE 50 MG/5ML IV SOLN
INTRAVENOUS | Status: AC
  Filled 2013-09-10: qty 2

## 2013-09-10 MED ORDER — ARTIFICIAL TEARS OP OINT
TOPICAL_OINTMENT | OPHTHALMIC | Status: DC | PRN
  Administered 2013-09-10: 1 via OPHTHALMIC

## 2013-09-10 MED ORDER — KETOROLAC TROMETHAMINE 30 MG/ML IJ SOLN
30.0000 mg | Freq: Four times a day (QID) | INTRAMUSCULAR | Status: DC
  Administered 2013-09-10 – 2013-09-11 (×5): 30 mg via INTRAVENOUS
  Filled 2013-09-10 (×8): qty 1

## 2013-09-10 MED ORDER — GABAPENTIN 100 MG PO CAPS
100.0000 mg | ORAL_CAPSULE | Freq: Three times a day (TID) | ORAL | Status: DC
  Administered 2013-09-10 – 2013-09-11 (×3): 100 mg via ORAL
  Filled 2013-09-10 (×5): qty 1

## 2013-09-10 MED ORDER — SODIUM CHLORIDE 0.9 % IR SOLN
Status: DC | PRN
  Administered 2013-09-10: 11:00:00

## 2013-09-10 MED ORDER — OXYCODONE-ACETAMINOPHEN 5-325 MG PO TABS
1.0000 | ORAL_TABLET | ORAL | Status: DC | PRN
  Administered 2013-09-10 – 2013-09-11 (×4): 2 via ORAL
  Filled 2013-09-10 (×4): qty 2

## 2013-09-10 MED ORDER — KCL IN DEXTROSE-NACL 20-5-0.45 MEQ/L-%-% IV SOLN
INTRAVENOUS | Status: DC
  Filled 2013-09-10 (×5): qty 1000

## 2013-09-10 MED ORDER — 0.9 % SODIUM CHLORIDE (POUR BTL) OPTIME
TOPICAL | Status: DC | PRN
  Administered 2013-09-10 (×2): 1000 mL

## 2013-09-10 MED ORDER — ACETAMINOPHEN 325 MG PO TABS: 650.0000 mg | ORAL_TABLET | ORAL | Status: DC | PRN

## 2013-09-10 MED ORDER — ACETAMINOPHEN 10 MG/ML IV SOLN
INTRAVENOUS | Status: AC
  Filled 2013-09-10: qty 100

## 2013-09-10 MED ORDER — HYDROMORPHONE HCL PF 1 MG/ML IJ SOLN
0.2500 mg | INTRAMUSCULAR | Status: DC | PRN
  Administered 2013-09-10 (×4): 0.5 mg via INTRAVENOUS

## 2013-09-10 MED ORDER — PHENYLEPHRINE HCL 10 MG/ML IJ SOLN
30.0000 ug/min | INTRAVENOUS | Status: AC
  Filled 2013-09-10: qty 1

## 2013-09-10 MED ORDER — CYCLOBENZAPRINE HCL 10 MG PO TABS
ORAL_TABLET | ORAL | Status: AC
  Filled 2013-09-10: qty 1

## 2013-09-10 MED ORDER — LIDOCAINE-EPINEPHRINE 1 %-1:100000 IJ SOLN
INTRAMUSCULAR | Status: DC | PRN
  Administered 2013-09-10: 7.5 mL

## 2013-09-10 MED ORDER — ARTIFICIAL TEARS OP OINT
TOPICAL_OINTMENT | OPHTHALMIC | Status: AC
  Filled 2013-09-10: qty 3.5

## 2013-09-10 MED ORDER — HYDROXYZINE HCL 25 MG PO TABS: 50.0000 mg | ORAL_TABLET | ORAL | Status: DC | PRN

## 2013-09-10 MED ORDER — MIDAZOLAM HCL 2 MG/2ML IJ SOLN
INTRAMUSCULAR | Status: AC
  Filled 2013-09-10: qty 2

## 2013-09-10 MED ORDER — PROPOFOL 10 MG/ML IV BOLUS
INTRAVENOUS | Status: DC | PRN
  Administered 2013-09-10: 170 mg via INTRAVENOUS

## 2013-09-10 MED ORDER — LACTATED RINGERS IV SOLN
INTRAVENOUS | Status: DC | PRN
  Administered 2013-09-10 (×4): via INTRAVENOUS

## 2013-09-10 MED ORDER — SODIUM CHLORIDE 0.9 % IJ SOLN
3.0000 mL | Freq: Two times a day (BID) | INTRAMUSCULAR | Status: DC
  Administered 2013-09-10 – 2013-09-11 (×2): 3 mL via INTRAVENOUS

## 2013-09-10 MED ORDER — MIDAZOLAM HCL 5 MG/5ML IJ SOLN
INTRAMUSCULAR | Status: DC | PRN
  Administered 2013-09-10: 2 mg via INTRAVENOUS

## 2013-09-10 MED ORDER — BUPIVACAINE HCL (PF) 0.5 % IJ SOLN
INTRAMUSCULAR | Status: DC | PRN
  Administered 2013-09-10: 7.5 mL

## 2013-09-10 MED ORDER — LIDOCAINE HCL (CARDIAC) 20 MG/ML IV SOLN
INTRAVENOUS | Status: AC
  Filled 2013-09-10: qty 5

## 2013-09-10 MED ORDER — ONDANSETRON HCL 4 MG/2ML IJ SOLN
INTRAMUSCULAR | Status: DC | PRN
  Administered 2013-09-10: 4 mg via INTRAVENOUS

## 2013-09-10 MED ORDER — SODIUM CHLORIDE 0.9 % IJ SOLN: 3.0000 mL | INTRAMUSCULAR | Status: DC | PRN

## 2013-09-10 MED ORDER — PHENYLEPHRINE HCL 10 MG/ML IJ SOLN
INTRAMUSCULAR | Status: DC | PRN
  Administered 2013-09-10: 40 ug via INTRAVENOUS
  Administered 2013-09-10: 80 ug via INTRAVENOUS
  Administered 2013-09-10: 40 ug via INTRAVENOUS
  Administered 2013-09-10: 80 ug via INTRAVENOUS
  Administered 2013-09-10 (×2): 40 ug via INTRAVENOUS

## 2013-09-10 MED ORDER — KETOROLAC TROMETHAMINE 30 MG/ML IJ SOLN
INTRAMUSCULAR | Status: AC
  Filled 2013-09-10: qty 1

## 2013-09-10 MED ORDER — LIDOCAINE HCL (CARDIAC) 20 MG/ML IV SOLN
INTRAVENOUS | Status: DC | PRN
  Administered 2013-09-10: 100 mg via INTRAVENOUS

## 2013-09-10 MED ORDER — NEOSTIGMINE METHYLSULFATE 10 MG/10ML IV SOLN
INTRAVENOUS | Status: DC | PRN
  Administered 2013-09-10: 4 mg via INTRAVENOUS

## 2013-09-10 MED ORDER — ACETAMINOPHEN 650 MG RE SUPP: 650.0000 mg | RECTAL | Status: DC | PRN

## 2013-09-10 MED ORDER — ONDANSETRON HCL 4 MG/2ML IJ SOLN
INTRAMUSCULAR | Status: AC
  Filled 2013-09-10: qty 2

## 2013-09-10 MED ORDER — CYCLOBENZAPRINE HCL 10 MG PO TABS
10.0000 mg | ORAL_TABLET | Freq: Three times a day (TID) | ORAL | Status: DC | PRN
  Administered 2013-09-10: 10 mg via ORAL

## 2013-09-10 MED ORDER — ROCURONIUM BROMIDE 100 MG/10ML IV SOLN
INTRAVENOUS | Status: DC | PRN
  Administered 2013-09-10: 20 mg via INTRAVENOUS
  Administered 2013-09-10: 10 mg via INTRAVENOUS
  Administered 2013-09-10: 20 mg via INTRAVENOUS
  Administered 2013-09-10: 50 mg via INTRAVENOUS

## 2013-09-10 MED ORDER — PHENYLEPHRINE HCL 10 MG/ML IJ SOLN
10.0000 mg | INTRAVENOUS | Status: DC | PRN
  Administered 2013-09-10: 25 ug/min via INTRAVENOUS

## 2013-09-10 MED ORDER — THROMBIN 5000 UNITS EX SOLR
CUTANEOUS | Status: DC | PRN
  Administered 2013-09-10: 12:00:00 via TOPICAL

## 2013-09-10 SURGICAL SUPPLY — 83 items
ADH SKN CLS APL DERMABOND .7 (GAUZE/BANDAGES/DRESSINGS)
ADH SKN CLS LQ APL DERMABOND (GAUZE/BANDAGES/DRESSINGS) ×3
BAG DECANTER FOR FLEXI CONT (MISCELLANEOUS) ×2 IMPLANT
BLADE 10 SAFETY STRL DISP (BLADE) ×1 IMPLANT
BLADE SURG ROTATE 9660 (MISCELLANEOUS) ×1 IMPLANT
BRUSH SCRUB EZ PLAIN DRY (MISCELLANEOUS) ×2 IMPLANT
BUR ACRON 5.0MM COATED (BURR) ×2 IMPLANT
BUR MATCHSTICK NEURO 3.0 LAGG (BURR) ×2 IMPLANT
CANISTER SUCT 3000ML (MISCELLANEOUS) ×2 IMPLANT
CAP LCK SPNE (Orthopedic Implant) ×4 IMPLANT
CAP LOCK SPINE RADIUS (Orthopedic Implant) IMPLANT
CAP LOCKING (Orthopedic Implant) ×8 IMPLANT
CONT SPEC 4OZ CLIKSEAL STRL BL (MISCELLANEOUS) ×2 IMPLANT
COVER BACK TABLE 24X17X13 BIG (DRAPES) IMPLANT
COVER TABLE BACK 60X90 (DRAPES) ×2 IMPLANT
DERMABOND ADHESIVE PROPEN (GAUZE/BANDAGES/DRESSINGS) ×3
DERMABOND ADVANCED (GAUZE/BANDAGES/DRESSINGS)
DERMABOND ADVANCED .7 DNX12 (GAUZE/BANDAGES/DRESSINGS) ×2 IMPLANT
DERMABOND ADVANCED .7 DNX6 (GAUZE/BANDAGES/DRESSINGS) IMPLANT
DRAPE C-ARM 42X72 X-RAY (DRAPES) ×4 IMPLANT
DRAPE LAPAROTOMY 100X72X124 (DRAPES) ×2 IMPLANT
DRAPE POUCH INSTRU U-SHP 10X18 (DRAPES) ×2 IMPLANT
DRAPE PROXIMA HALF (DRAPES) IMPLANT
DRSG EMULSION OIL 3X3 NADH (GAUZE/BANDAGES/DRESSINGS) IMPLANT
ELECT REM PT RETURN 9FT ADLT (ELECTROSURGICAL) ×2
ELECTRODE REM PT RTRN 9FT ADLT (ELECTROSURGICAL) ×1 IMPLANT
GAUZE SPONGE 4X4 16PLY XRAY LF (GAUZE/BANDAGES/DRESSINGS) IMPLANT
GLOVE BIO SURGEON STRL SZ7 (GLOVE) ×1 IMPLANT
GLOVE BIO SURGEON STRL SZ8.5 (GLOVE) ×1 IMPLANT
GLOVE BIOGEL PI IND STRL 7.0 (GLOVE) IMPLANT
GLOVE BIOGEL PI IND STRL 8 (GLOVE) ×2 IMPLANT
GLOVE BIOGEL PI INDICATOR 7.0 (GLOVE) ×2
GLOVE BIOGEL PI INDICATOR 8 (GLOVE) ×4
GLOVE ECLIPSE 7.5 STRL STRAW (GLOVE) ×7 IMPLANT
GLOVE EXAM NITRILE LRG STRL (GLOVE) IMPLANT
GLOVE EXAM NITRILE MD LF STRL (GLOVE) IMPLANT
GLOVE EXAM NITRILE XL STR (GLOVE) IMPLANT
GLOVE EXAM NITRILE XS STR PU (GLOVE) IMPLANT
GLOVE SS BIOGEL STRL SZ 8 (GLOVE) IMPLANT
GLOVE SUPERSENSE BIOGEL SZ 8 (GLOVE) ×1
GLOVE SURG SS PI 6.5 STRL IVOR (GLOVE) ×1 IMPLANT
GOWN STRL REUS W/ TWL LRG LVL3 (GOWN DISPOSABLE) ×1 IMPLANT
GOWN STRL REUS W/ TWL XL LVL3 (GOWN DISPOSABLE) ×1 IMPLANT
GOWN STRL REUS W/TWL 2XL LVL3 (GOWN DISPOSABLE) ×1 IMPLANT
GOWN STRL REUS W/TWL LRG LVL3 (GOWN DISPOSABLE) ×2
GOWN STRL REUS W/TWL XL LVL3 (GOWN DISPOSABLE) ×6
KIT BASIN OR (CUSTOM PROCEDURE TRAY) ×2 IMPLANT
KIT INFUSE SMALL (Orthopedic Implant) ×1 IMPLANT
KIT ROOM TURNOVER OR (KITS) ×2 IMPLANT
MILL MEDIUM DISP (BLADE) ×2 IMPLANT
NDL SPNL 18GX3.5 QUINCKE PK (NEEDLE) IMPLANT
NDL SPNL 22GX3.5 QUINCKE BK (NEEDLE) ×2 IMPLANT
NEEDLE BONE MARROW 8GAX6 (NEEDLE) ×1 IMPLANT
NEEDLE SPNL 18GX3.5 QUINCKE PK (NEEDLE) ×2 IMPLANT
NEEDLE SPNL 22GX3.5 QUINCKE BK (NEEDLE) ×2 IMPLANT
NS IRRIG 1000ML POUR BTL (IV SOLUTION) ×3 IMPLANT
PACK LAMINECTOMY NEURO (CUSTOM PROCEDURE TRAY) ×2 IMPLANT
PAD ARMBOARD 7.5X6 YLW CONV (MISCELLANEOUS) ×8 IMPLANT
PATTIES SURGICAL .5 X.5 (GAUZE/BANDAGES/DRESSINGS) IMPLANT
PATTIES SURGICAL .5 X1 (DISPOSABLE) IMPLANT
PATTIES SURGICAL 1X1 (DISPOSABLE) IMPLANT
PEEK PLIF AVS 11X25X4 (Peek) ×2 IMPLANT
ROD RADIUS 40MM (Neuro Prosthesis/Implant) ×2 IMPLANT
ROD SPNL 40X5.5XNS TI RDS (Neuro Prosthesis/Implant) IMPLANT
ROD STRAIGHT 45MM (Rod) ×1 IMPLANT
SCREW 5.75 X 635 (Screw) ×2 IMPLANT
SCREW 5.75X40M (Screw) ×2 IMPLANT
SPONGE GAUZE 4X4 12PLY (GAUZE/BANDAGES/DRESSINGS) ×2 IMPLANT
SPONGE LAP 4X18 X RAY DECT (DISPOSABLE) IMPLANT
SPONGE NEURO XRAY DETECT 1X3 (DISPOSABLE) ×1 IMPLANT
SPONGE SURGIFOAM ABS GEL 100 (HEMOSTASIS) ×2 IMPLANT
STRIP BIOACTIVE VITOSS 25X100X (Neuro Prosthesis/Implant) ×2 IMPLANT
SUT VIC AB 1 CT1 18XBRD ANBCTR (SUTURE) ×2 IMPLANT
SUT VIC AB 1 CT1 8-18 (SUTURE) ×4
SUT VIC AB 2-0 CP2 18 (SUTURE) ×4 IMPLANT
SYR 20ML ECCENTRIC (SYRINGE) ×2 IMPLANT
SYR 3ML LL SCALE MARK (SYRINGE) ×8 IMPLANT
SYR CONTROL 10ML LL (SYRINGE) ×2 IMPLANT
TAPE CLOTH SURG 4X10 WHT LF (GAUZE/BANDAGES/DRESSINGS) ×1 IMPLANT
TOWEL OR 17X24 6PK STRL BLUE (TOWEL DISPOSABLE) ×2 IMPLANT
TOWEL OR 17X26 10 PK STRL BLUE (TOWEL DISPOSABLE) ×2 IMPLANT
TRAY FOLEY CATH 14FRSI W/METER (CATHETERS) ×1 IMPLANT
WATER STERILE IRR 1000ML POUR (IV SOLUTION) ×2 IMPLANT

## 2013-09-10 NOTE — Anesthesia Postprocedure Evaluation (Signed)
  Anesthesia Post-op Note  Patient: Leonard [name]  Procedure(s) Performed: Procedure(s): LUMBAR FOUR-FIVE DECOMPRESSION WITH POSTERIOR LUMBAR INTERBODY FUSION WITH INTERBODY PROSTHESIS,POSTERIOR LATERAL ARTHRODESIS AND POSTERIOR NONSEGMENTAL INSTRUMENTATION (N/A)  Patient Location: PACU  Anesthesia Type:General  Level of Consciousness: awake  Airway and Oxygen Therapy: Patient Spontanous Breathing  Post-op Pain: mild  Post-op Assessment: Post-op Vital signs reviewed  Post-op Vital Signs: Reviewed  Last Vitals:  Filed Vitals:   09/10/13 1400  BP: 114/75  Pulse: 83  Temp:   Resp: 27    Complications: No apparent anesthesia complications

## 2013-09-10 NOTE — Transfer of Care (Signed)
Immediate Anesthesia Transfer of Care Note  Patient: Leonard [name]  Procedure(s) Performed: Procedure(s): LUMBAR FOUR-FIVE DECOMPRESSION WITH POSTERIOR LUMBAR INTERBODY FUSION WITH INTERBODY PROSTHESIS,POSTERIOR LATERAL ARTHRODESIS AND POSTERIOR NONSEGMENTAL INSTRUMENTATION (N/A)  Patient Location: PACU  Anesthesia Type:General  Level of Consciousness: awake, alert  and oriented  Airway & Oxygen Therapy: Patient Spontanous Breathing and Patient connected to nasal cannula oxygen  Post-op Assessment: Report given to PACU RN, Post -op Vital signs reviewed and stable and Patient moving all extremities X 4  Post vital signs: Reviewed and stable  Complications: No apparent anesthesia complications

## 2013-09-10 NOTE — Op Note (Signed)
09/10/2013  1:46 PM  PATIENT:  Leonard [name]  53 y.o. male  PRE-OPERATIVE DIAGNOSIS:  L4-5 grade 1 dynamic spondylolisthesis, L4-5 lumbar stenosis with neurogenic claudication, lumbar degenerative disc disease, lumbar spondylosis, lumbar radiculopathy  POST-OPERATIVE DIAGNOSIS:  L4-5 grade 1 dynamic spondylolisthesis, L4-5 lumbar stenosis with neurogenic claudication, lumbar degenerative disc disease, lumbar spondylosis, lumbar radiculopathy  PROCEDURE:  Procedure(s):  Bilateral L4-5 lumbar decompression including bilateral L4 and L5 laminectomy, facetectomy, and foraminotomy, with decompression of the canal stenosis and bilateral neural foraminal stenosis, with decompression of the exiting L4 and L5 nerve roots bilaterally, with decompression beyond prior for interbody arthrodesis; bilateral L4-5 posterior lumbar interbody arthrodesis with AVS peek interbody implants and Vitoss BA with bone marrow aspirate; bilateral L4-5 posterior lateral arthrodesis with nonsegmental radius posterior instrumentation, Vitoss BA with bone marrow aspirate, and infuse  SURGEON:  Surgeon(s): Hosie Spangle, MD Ophelia Charter, MD  ASSISTANTS: Ophelia Charter, M.D.  ANESTHESIA:   general  EBL:  Total I/O In: 3000 [I.V.:3000] Out: 490 [Urine:240; Blood:250]  BLOOD ADMINISTERED:none  CELL SAVER GIVEN: The Cell Saver technician felt that there was insufficient blood loss to process the collected blood.  COUNT: Correct per nursing staff  DICTATION: Patient is brought to the operating room placed under general endotracheal anesthesia. The patient was turned to prone position the lumbar region was prepped with Betadine soap and solution and draped in a sterile fashion. The midline was infiltrated with local anesthesia with epinephrine. A midline incision was made, through the previous midline incision, and extended rostral and caudally necessary, and carried down through the subcutaneous tissue, bipolar  cautery and electrocautery were used to maintain hemostasis. Dissection was carried down to the lumbar fascia. The fascia was incised bilaterally and the paraspinal muscles were dissected with a spinous process and lamina in a subperiosteal fashion. An x-ray was taken for localization and the L4-5 level was localized. There was extensive scar tissue from his previous surgery, there was carefully dissected from the spinous process, lamina, and facet complex. We found that the right L4 intra-articular process had been previously removed. There was a partial laminotomy on the left at L4, and a minimal laminotomy on the right at L4. There is hypertrophic degenerative facet arthropathy bilaterally. Facetectomy was performed bilaterally at L4-5 to decompress the foramina, expose the intertransverse space, and provide [place] access to work our way back towards the spinal canal. The transverse processes of L4 and L5 were exposed, and decorticated. We then performed a laminectomy of L4 and L5, to decompress the spinal canal. The thickened ligamentum flavum was carefully removed, and the thecal sac decompressed. We then turned our attention to the neural foramina, and the exiting L4 and L5 nerve roots bilaterally. Bony and ligamentum overgrowth was removed so as to decompress the exiting nerve roots within their stenotic neural foramina. Once the decompression stenotic compression of the thecal sac and exiting nerve roots was completed we proceeded with the posterior lumbar interbody arthrodesis. The annulus was incised bilaterally and the disc space entered. A thorough discectomy was performed using pituitary rongeurs and curettes. Once the discectomy was completed we began to prepare the endplate surfaces removing the cartilaginous endplates surface. We then measured the height of the intervertebral disc space. We selected 11 x 25 x 4 AVS peek interbody implants.  The C-arm fluoroscope was then draped and brought in the  field and we identified the pedicle entry points bilaterally at the L4 and L5 levels. Each of the 4 pedicles  was probed, we aspirated bone marrow aspirate from the vertebral bodies, this was injected over two 10 cc strips of Vitoss BA. Then each of the pedicles was examined with the ball probe good bony surfaces were found and no bony cuts were found. Each of the pedicles was then tapped with a 5.25 mm tap, again examined with the ball probe good threading was found and no bony cuts were found. We then placed 5.75 by 40 millimeter screws at the L4 level, and 5.75 35 mm screws bilaterally at the L5 level.  We then packed the AVS peek interbody implants with Vitoss BA with bone marrow aspirate, and then placed the first implant and on the right side, carefully retracting the thecal sac and nerve root medially. We then went back to the left side and packed the midline with additional Vitoss with bone marrow aspirate and then placed a second implant and on the left side again retracting the thecal sac and nerve root medially. Additional Vitoss BA with bone marrow aspirate was packed lateral to the implants.  We then packed the lateral gutter over the transverse processes and intertransverse space with Vitoss BA with bone marrow aspirate and infuse. We then selected a pre-lordosed 40 mm rod for the right side and a straight 45 rod for the left side, they were placed within the screw heads and secured with locking caps once all 4 locking caps were placed final tightening was performed against a counter torque.  The wound had been irrigated multiple times during the procedure with saline solution and bacitracin solution, good hemostasis was established with a combination of bipolar cautery and Gelfoam with thrombin. Once good hemostasis was confirmed we proceeded with closure paraspinal muscles deep fascia and Scarpa's fascia were closed with interrupted undyed 1 Vicryl sutures the subcutaneous and subcuticular  closed with interrupted inverted 2-0 undyed Vicryl sutures the skin edges were approximated with Dermabond. The wound was dressed with sterile gauze and Hypafix.  Following surgery the patient was turned back to the supine position to be reversed and the anesthetic extubated and transferred to the recovery room for further care.   PLAN OF CARE: Admit to inpatient   PATIENT DISPOSITION:  PACU - hemodynamically stable.   Delay start of Pharmacological VTE agent (>24hrs) due to surgical blood loss or risk of bleeding:  yes

## 2013-09-10 NOTE — Anesthesia Preprocedure Evaluation (Addendum)
Anesthesia Evaluation  Patient identified by MRN, date of birth, ID band Patient awake    Reviewed: Allergy & Precautions, H&P , NPO status , Patient's Chart, lab work & pertinent test results  History of Anesthesia Complications (+) PONV  Airway Mallampati: II TM Distance: >3 FB Neck ROM: Full    Dental  (+) Teeth Intact, Dental Advisory Given, Chipped,    Pulmonary neg pulmonary ROS, former smoker,  breath sounds clear to auscultation        Cardiovascular negative cardio ROS  Rhythm:Regular Rate:Normal     Neuro/Psych  Headaches,    GI/Hepatic GERD-  ,(+)     substance abuse  ,   Endo/Other  negative endocrine ROS  Renal/GU      Musculoskeletal   Abdominal   Peds  Hematology   Anesthesia Other Findings   Reproductive/Obstetrics                        Anesthesia Physical Anesthesia Plan  ASA: III  Anesthesia Plan: General   Post-op Pain Management:    Induction: Intravenous  Airway Management Planned: Oral ETT  Additional Equipment:   Intra-op Plan:   Post-operative Plan: Extubation in OR  Informed Consent: I have reviewed the patients History and Physical, chart, labs and discussed the procedure including the risks, benefits and alternatives for the proposed anesthesia with the patient or authorized representative who has indicated his/her understanding and acceptance.   Dental advisory given  Plan Discussed with: CRNA and Anesthesiologist  Anesthesia Plan Comments:         Anesthesia Quick Evaluation

## 2013-09-10 NOTE — Anesthesia Procedure Notes (Signed)
Procedure Name: MAC, LMA Insertion, Intubation and Awake intubation Date/Time: 09/10/2013 9:14 AM Performed by: Gaylene Brooks Pre-anesthesia Checklist: Patient being monitored, Suction available, Emergency Drugs available, Patient identified and Timeout performed Patient Re-evaluated:Patient Re-evaluated prior to inductionOxygen Delivery Method: Circle system utilized Preoxygenation: Pre-oxygenation with 100% oxygen Intubation Type: IV induction Ventilation: Mask ventilation without difficulty Laryngoscope Size: Miller and 2 Grade View: Grade I Tube type: Oral Tube size: 7.5 mm Number of attempts: 1 Airway Equipment and Method: Stylet Placement Confirmation: ETT inserted through vocal cords under direct vision,  breath sounds checked- equal and bilateral,  positive ETCO2 and CO2 detector Secured at: 23 cm Tube secured with: Tape Dental Injury: Teeth and Oropharynx as per pre-operative assessment  Comments: Little to no neck extension during laryngoscopy

## 2013-09-10 NOTE — Plan of Care (Signed)
Problem: Consults Goal: Diagnosis - Spinal Surgery Thoraco/Lumbar Spine Fusion     

## 2013-09-10 NOTE — Progress Notes (Signed)
Filed Vitals:   09/10/13 1430 09/10/13 1445 09/10/13 1504 09/10/13 1625  BP: 112/66 104/62 102/69 111/68  Pulse: 83 81 82 75  Temp:  98.4 F (36.9 C) 97.5 F (36.4 C) 95.5 F (35.3 C)  TempSrc:    Axillary  Resp:   18 18  SpO2: 98% 97% 95% 98%    Patient resting in bed, comfortable. Dressing clean and dry. Moving all extremities well. Foley to straight drainage. Has not yet ambulated.  Plan: Spoke with the patient's wife about the importance of ambulation. Once up and about will be able to discontinue Foley.  Hosie Spangle, MD 09/10/2013, 5:35 PM

## 2013-09-10 NOTE — H&P (Signed)
Subjective: Patient is a 53 y.o. right handed white male who is admitted for treatment of L4-5 lumbar stenosis and dynamic grade 1 spondylolisthesis. Patient underwent bilateral L4-5 laminotomy with Dr. Glenna Fellows in November of 2014. He felt better the first week or 2 after surgery, but then developed severe pain from low back radiating to the right buttock which has persisted. He was treated with increasing amounts of narcotic analgesic, eventually using oxycodone 10 mg tablets, 1-2 tablets every 4 hours for pain, in addition to drinking a bottle of red wine each night.  X-ray shows the previous bilateral L4-5 laminotomy. There appears to be a right L4 inferior articular process deficit, consistent with a pars defect. There is a grade 1 dynamic spinal listhesis at L4-5 it is new as compared to his intraoperative x-ray from November of 2014. MRI scan shows mild multifactorial lumbar stenosis at L3-4, moderate to marked central stenosis L4-5, with disruption of the right L4-5 facet joint, and significant facet hypertrophy, and widening of the joint bilaterally at L4-5, with postsurgical changes, including the bilateral laminotomies. The L5-S1 and L2-3 levels shows minimal degenerative changes. L1-2 is unremarkable.  When the patient was initially evaluated in April of this year, he mentioned that his primary physician did find elevation of his liver function tests. The patient subsequently underwent alcohol withdrawal detoxification and significant decrease in his narcotic usage through a lengthy hospitalization at [facility], including an extended ICU stay. At this point the patient does not drink any alcohol for 3 weeks, and has decreased his oxycodone usage to 10 mg 3 times a day.  Patient is admitted now for L4-5 lumbar decompression including laminectomy, facetectomy, and foraminotomy, and a bilateral L4-5 posterior lumbar interbody arthrodesis with interbody implants and bone graft, and a bilateral  L4-5 posterolateral arthrodesis with posterior instrumentation patient and bone graft.  The patient did undergo preoperative cardiology clearance last week by Dr. Donnella Bi, including a cardiac stress test. He was cleared for surgery and general anesthesia.   Patient Active Problem List   Diagnosis Date Noted  . Hyperlipidemia 09/05/2013  . Abnormal EKG 09/05/2013  . Alcohol dependence 08/20/2013  . Opiate dependence 08/20/2013   Past Medical History  Diagnosis Date  . Anxiety   . Depression   . PONV (postoperative nausea and vomiting)   . Hyperlipidemia     was on meds but has been off x 5 months d/t elevated liver enzymes  . Headache(784.0)     minor and occasionally  . Dizziness   . Weakness     pain and occasionally tingling in right arm and leg  . Chronic back pain     spondylolisthesis,spondylosis,DDD,stenosis  . GERD (gastroesophageal reflux disease)     occasionally and takes Pepcid if needed  . Insomnia     takes Trazodone nightly  . Hyperlipidemia     statin intolerant because of elevated liver function tests  . Abnormal EKG     anterolateral T wave inversion    Past Surgical History  Procedure Laterality Date  . Cervical fusion      x 2  . Knee surgery Right 2011  . Appendectomy  as a child  . Leg surgery Right as a child    compound fracture  . Radial nerve impingement Right 2009  . Back surgery  2014  . Colonoscopy      Prescriptions prior to admission  Medication Sig Dispense Refill  . sertraline (ZOLOFT) 50 MG tablet Take 50 mg by  mouth 2 (two) times daily.      . traZODone (DESYREL) 100 MG tablet Take 100-300 mg by mouth at bedtime. Sleep disturbance      . gabapentin (NEURONTIN) 100 MG capsule Take 100 mg by mouth 3 (three) times daily.      Marland Kitchen oxyCODONE (ROXICODONE) 15 MG immediate release tablet Take 10 mg by mouth every 4 (four) hours as needed for pain.       Allergies  Allergen Reactions  . Sulfa Antibiotics Swelling and Rash     History  Substance Use Topics  . Smoking status: Former Smoker -- 0.50 packs/day for 2 years    Types: Cigarettes  . Smokeless tobacco: Never Used     Comment: smoked while he was in high school  . Alcohol Use: Yes     Comment: 14 bottles of wine a week ;nothing in 2wks    No family history on file.   Review of Systems Pertinent items are noted in HPI.  Objective: Vital signs in last 24 hours:    EXAM: Patient is a well-developed well nourished white male in discomfort, but no acute distress.   Lungs are clear to auscultation , the patient has symmetrical respiratory excursion. Heart has a regular rate and rhythm normal S1 and S2 no murmur.   Abdomen is soft nontender nondistended bowel sounds are present. Extremity examination shows no clubbing cyanosis or edema. Motor examination shows 5 over 5 strength in the lower extremities including the iliopsoas quadriceps dorsiflexor extensor hallicus  longus and plantar flexor bilaterally. Sensation somewhat decreased to pinprick in the right leg, as compared to the left leg. Intact to pinprick in the feet bilaterally.. Reflexes are symmetrical bilaterally. No pathologic reflexes are present. Gait and stance both favor the right lower extremity, and he uses a single-point cane for both gait and stance.   Data Review:CBC    Component Value Date/Time   WBC 7.5 09/03/2013 1413   RBC 4.77 09/03/2013 1413   HGB 16.2 09/03/2013 1413   HCT 46.8 09/03/2013 1413   PLT 284 09/03/2013 1413   MCV 98.1 09/03/2013 1413   MCH 34.0 09/03/2013 1413   MCHC 34.6 09/03/2013 1413   RDW 12.6 09/03/2013 1413   LYMPHSABS 1.0 08/19/2013 1740   MONOABS 0.4 08/19/2013 1740   EOSABS 0.0 08/19/2013 1740   BASOSABS 0.0 08/19/2013 1740                          BMET    Component Value Date/Time   NA 140 09/03/2013 1413   K 4.0 09/03/2013 1413   CL 102 09/03/2013 1413   CO2 24 09/03/2013 1413   GLUCOSE 89 09/03/2013 1413   BUN 9 09/03/2013 1413   CREATININE 1.06 09/03/2013  1413   CALCIUM 9.8 09/03/2013 1413   GFRNONAA 78* 09/03/2013 1413   GFRAA >90 09/03/2013 1413     Assessment/Plan: Patient was degenerative changes at the L4-5 level including spondylosis and degenerative disc disease, with postsurgical iatrogenic changes as well at the L4-5 level, with a postsurgical grade 1 dynamic degenerative spondylolisthesis, with moderate to marked multifactorial stenosis. He is admitted now for decompression and stabilization at the L4-5 level.  I've discussed with the patient the nature of his condition, the nature the surgical procedure, the typical length of surgery, hospital stay, and overall recuperation, the limitations postoperatively, and risks of surgery. I discussed risks including risks of infection, bleeding, possibly need for transfusion, the risk  of nerve root dysfunction with pain, weakness, numbness, or paresthesias, the risk of dural tear and CSF leakage and possible need for further surgery, the risk of failure of the arthrodesis and possibly for further surgery, the risk of anesthetic complications including myocardial infarction, stroke, pneumonia, and death. We discussed the need for postoperative immobilization in a lumbar brace. Understanding all this the patient does wish to proceed with surgery and is admitted for such.     Hosie Spangle, MD 09/10/2013 7:25 AM

## 2013-09-11 MED ORDER — OXYCODONE HCL 5 MG PO TABS: 5.0000 mg | ORAL_TABLET | ORAL | Status: DC | PRN

## 2013-09-11 MED FILL — Sodium Chloride IV Soln 0.9%: INTRAVENOUS | Qty: 1000 | Status: AC

## 2013-09-11 MED FILL — Sodium Chloride Irrigation Soln 0.9%: Qty: 3000 | Status: AC

## 2013-09-11 MED FILL — Heparin Sodium (Porcine) Inj 1000 Unit/ML: INTRAMUSCULAR | Qty: 30 | Status: AC

## 2013-09-11 NOTE — Progress Notes (Signed)
Utilization review completed.  

## 2013-09-11 NOTE — Progress Notes (Signed)
PT. UP AD LIB IN HALL, TOLERATING DIET AND PO MEDICATIONS. V/S STABLE. PT. VERBALIZED UNDERSTANDING OF D/C ORDERS.  STAFF AND SPOUSE ASSISTING PT. TO CAR VIA WHEELCHAIR. NO DISTRESS NOTED.   [place] RN

## 2013-09-11 NOTE — Discharge Instructions (Signed)

## 2013-09-11 NOTE — Plan of Care (Signed)
Problem: Consults Goal: Diagnosis - Spinal Surgery Outcome: Completed/Met Date Met:  09/11/13 Thoraco/Lumbar Spine Fusion

## 2013-09-11 NOTE — Progress Notes (Signed)
Filed Vitals:   09/10/13 2001 09/11/13 0010 09/11/13 0400 09/11/13 0756  BP: 113/69 154/76 96/58 110/53  Pulse: 89 98 103 100  Temp: 97.9 F (36.6 C) 99.1 F (37.3 C) 98.6 F (37 C) 97.6 F (36.4 C)  TempSrc: Oral Oral Oral   Resp: 20 20 18 20   SpO2: 95% 94% 93% 93%    Patient resting in bed, comfortable. Has been up and ambulating in the halls. Dressing clean and dry. Foley removed last night, and patient has voided well since it is out.  Plan: Have encouraged patient to ambulate 4 or 5 times in the halls today, and try to taper discontinue the use of his single point cane.  Hosie Spangle, MD 09/11/2013, 7:59 AM

## 2013-09-11 NOTE — Discharge Summary (Signed)
Physician Discharge Summary  Patient ID: Leonard [name] MRN: [id number] DOB/AGE: 54/26/1962 54 y.o.  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 Postoperatively he has done well. He is up and ambulating actively in the halls. His Foley was removed, and he is voiding well. His dressing was removed, and the wound is healing nicely. He is being discharged to home with instructions regarding wound care and activities. He is to return for followup with me in 2 and half weeks.  Discharge Exam: Blood pressure 99/63, pulse 92, temperature 98.9 F (37.2 C), temperature source Oral, resp. rate 18, SpO2 92.00%.  Disposition: Home     Medication List         gabapentin 100 MG capsule  Commonly known as:  NEURONTIN  Take 100 mg by mouth 3 (three) times daily.     oxyCODONE 15 MG immediate release tablet  Commonly known as:  ROXICODONE  Take 10 mg by mouth every 4 (four) hours as needed for pain.     oxyCODONE 5 MG immediate release tablet  Commonly known as:  ROXICODONE  Take 1-2 tablets (5-10 mg total) by mouth every 4 (four) hours as needed for moderate pain or severe pain.     sertraline 50 MG tablet  Commonly known as:  ZOLOFT  Take 50 mg by mouth 2 (two) times daily.     traZODone 100 MG tablet  Commonly known as:  DESYREL  Take 100-300 mg by mouth at bedtime. Sleep disturbance           Follow-up Information   Follow up with Hosie Spangle, MD.   Specialty:  Neurosurgery   Contact information:   [address] [place] [address] [phone]       Signed: Hosie Spangle, MD 09/11/2013, 7:41 PM

## 2013-09-17 ENCOUNTER — Institutional Professional Consult (permissible substitution): Payer: Self-pay | Admitting: Interventional Cardiology

## 2013-10-01 DIAGNOSIS — M6281 Muscle weakness (generalized): Secondary | ICD-10-CM | POA: Insufficient documentation

## 2013-10-01 DIAGNOSIS — R531 Weakness: Secondary | ICD-10-CM | POA: Insufficient documentation

## 2014-03-08 DIAGNOSIS — I7771 Dissection of carotid artery: Secondary | ICD-10-CM

## 2014-03-08 HISTORY — DX: Dissection of carotid artery: I77.71

## 2014-07-16 ENCOUNTER — Other Ambulatory Visit: Payer: Self-pay | Admitting: Family Medicine

## 2014-07-16 DIAGNOSIS — I723 Aneurysm of iliac artery: Secondary | ICD-10-CM

## 2014-07-17 ENCOUNTER — Ambulatory Visit
Admission: RE | Admit: 2014-07-17 | Discharge: 2014-07-17 | Disposition: A | Discharge status: Home / Self Care | Payer: PRIVATE HEALTH INSURANCE | Source: Ambulatory Visit | Attending: Family Medicine | Admitting: Family Medicine

## 2014-07-17 ENCOUNTER — Other Ambulatory Visit: Payer: Self-pay

## 2014-07-17 DIAGNOSIS — I723 Aneurysm of iliac artery: Secondary | ICD-10-CM

## 2014-07-17 MED ORDER — IOHEXOL 350 MG/ML SOLN
80.0000 mL | Freq: Once | INTRAVENOUS | Status: AC | PRN
  Administered 2014-07-17: 80 mL via INTRAVENOUS

## 2014-12-05 ENCOUNTER — Other Ambulatory Visit: Payer: Self-pay | Admitting: Neurosurgery

## 2014-12-05 DIAGNOSIS — M5442 Lumbago with sciatica, left side: Secondary | ICD-10-CM

## 2014-12-07 ENCOUNTER — Ambulatory Visit
Admission: RE | Admit: 2014-12-07 | Discharge: 2014-12-07 | Disposition: A | Discharge status: Home / Self Care | Payer: PRIVATE HEALTH INSURANCE | Source: Ambulatory Visit | Attending: Neurosurgery | Admitting: Neurosurgery

## 2014-12-07 DIAGNOSIS — M5442 Lumbago with sciatica, left side: Secondary | ICD-10-CM

## 2015-04-02 DIAGNOSIS — F419 Anxiety disorder, unspecified: Secondary | ICD-10-CM

## 2015-04-02 DIAGNOSIS — F329 Major depressive disorder, single episode, unspecified: Secondary | ICD-10-CM | POA: Insufficient documentation

## 2015-04-02 DIAGNOSIS — F32A Depression, unspecified: Secondary | ICD-10-CM | POA: Insufficient documentation

## 2015-07-07 ENCOUNTER — Other Ambulatory Visit: Payer: Self-pay | Admitting: Neurosurgery

## 2015-07-07 DIAGNOSIS — M5136 Other intervertebral disc degeneration, lumbar region: Secondary | ICD-10-CM

## 2015-07-11 ENCOUNTER — Other Ambulatory Visit: Payer: Self-pay

## 2015-07-18 ENCOUNTER — Other Ambulatory Visit: Payer: Self-pay | Admitting: Radiology

## 2015-07-18 ENCOUNTER — Ambulatory Visit
Admission: RE | Admit: 2015-07-18 | Discharge: 2015-07-18 | Disposition: A | Discharge status: Home / Self Care | Payer: PRIVATE HEALTH INSURANCE | Source: Ambulatory Visit | Attending: Neurosurgery | Admitting: Neurosurgery

## 2015-07-18 DIAGNOSIS — G4700 Insomnia, unspecified: Secondary | ICD-10-CM | POA: Insufficient documentation

## 2015-07-18 DIAGNOSIS — J301 Allergic rhinitis due to pollen: Secondary | ICD-10-CM | POA: Insufficient documentation

## 2015-07-18 DIAGNOSIS — F411 Generalized anxiety disorder: Secondary | ICD-10-CM | POA: Insufficient documentation

## 2015-07-18 DIAGNOSIS — M5136 Other intervertebral disc degeneration, lumbar region: Secondary | ICD-10-CM

## 2015-07-18 DIAGNOSIS — K760 Fatty (change of) liver, not elsewhere classified: Secondary | ICD-10-CM | POA: Insufficient documentation

## 2015-07-18 DIAGNOSIS — E7800 Pure hypercholesterolemia, unspecified: Secondary | ICD-10-CM | POA: Insufficient documentation

## 2015-07-18 MED ORDER — IOPAMIDOL (ISOVUE-M 200) INJECTION 41%
1.0000 mL | Freq: Once | INTRAMUSCULAR | Status: AC
  Administered 2015-07-18: 1 mL via EPIDURAL

## 2015-07-18 MED ORDER — METHYLPREDNISOLONE ACETATE 40 MG/ML INJ SUSP (RADIOLOG
120.0000 mg | Freq: Once | INTRAMUSCULAR | Status: AC
  Administered 2015-07-18: 120 mg via EPIDURAL

## 2015-07-18 NOTE — Discharge Instructions (Signed)

## 2015-07-21 ENCOUNTER — Other Ambulatory Visit: Payer: Self-pay | Admitting: Family Medicine

## 2015-07-21 DIAGNOSIS — I723 Aneurysm of iliac artery: Secondary | ICD-10-CM

## 2015-07-24 ENCOUNTER — Other Ambulatory Visit: Payer: Self-pay | Admitting: Neurosurgery

## 2015-07-24 DIAGNOSIS — M5136 Other intervertebral disc degeneration, lumbar region: Secondary | ICD-10-CM

## 2015-07-28 ENCOUNTER — Other Ambulatory Visit: Payer: Self-pay

## 2015-07-29 ENCOUNTER — Ambulatory Visit
Admission: RE | Admit: 2015-07-29 | Discharge: 2015-07-29 | Disposition: A | Discharge status: Home / Self Care | Payer: PRIVATE HEALTH INSURANCE | Source: Ambulatory Visit | Attending: Family Medicine | Admitting: Family Medicine

## 2015-07-29 DIAGNOSIS — I723 Aneurysm of iliac artery: Secondary | ICD-10-CM

## 2015-07-29 MED ORDER — IOPAMIDOL (ISOVUE-370) INJECTION 76%
100.0000 mL | Freq: Once | INTRAVENOUS | Status: AC | PRN
  Administered 2015-07-29: 100 mL via INTRAVENOUS

## 2015-07-31 ENCOUNTER — Other Ambulatory Visit: Payer: Self-pay | Admitting: Family Medicine

## 2015-07-31 DIAGNOSIS — R935 Abnormal findings on diagnostic imaging of other abdominal regions, including retroperitoneum: Secondary | ICD-10-CM

## 2015-08-01 ENCOUNTER — Ambulatory Visit
Admission: RE | Admit: 2015-08-01 | Discharge: 2015-08-01 | Disposition: A | Discharge status: Home / Self Care | Payer: PRIVATE HEALTH INSURANCE | Source: Ambulatory Visit | Attending: Family Medicine | Admitting: Family Medicine

## 2015-08-01 DIAGNOSIS — R935 Abnormal findings on diagnostic imaging of other abdominal regions, including retroperitoneum: Secondary | ICD-10-CM

## 2015-08-01 MED ORDER — GADOBENATE DIMEGLUMINE 529 MG/ML IV SOLN
16.0000 mL | Freq: Once | INTRAVENOUS | Status: AC | PRN
  Administered 2015-08-01: 16 mL via INTRAVENOUS

## 2015-08-11 ENCOUNTER — Ambulatory Visit
Admission: RE | Admit: 2015-08-11 | Discharge: 2015-08-11 | Disposition: A | Discharge status: Home / Self Care | Payer: PRIVATE HEALTH INSURANCE | Source: Ambulatory Visit | Attending: Neurosurgery | Admitting: Neurosurgery

## 2015-08-11 VITALS — BP 150/83 | HR 83

## 2015-08-11 DIAGNOSIS — M5136 Other intervertebral disc degeneration, lumbar region: Secondary | ICD-10-CM

## 2015-08-11 DIAGNOSIS — M4316 Spondylolisthesis, lumbar region: Secondary | ICD-10-CM

## 2015-08-11 MED ORDER — IOPAMIDOL (ISOVUE-M 200) INJECTION 41%
1.0000 mL | Freq: Once | INTRAMUSCULAR | Status: AC
  Administered 2015-08-11: 1 mL via EPIDURAL

## 2015-08-11 MED ORDER — KETOROLAC TROMETHAMINE 60 MG/2ML IM SOLN: 60.0000 mg | Freq: Once | INTRAMUSCULAR | Status: DC

## 2015-08-11 MED ORDER — METHYLPREDNISOLONE ACETATE 40 MG/ML INJ SUSP (RADIOLOG
120.0000 mg | Freq: Once | INTRAMUSCULAR | Status: AC
  Administered 2015-08-11: 120 mg via EPIDURAL

## 2016-01-23 ENCOUNTER — Other Ambulatory Visit: Payer: Self-pay | Admitting: Orthopedic Surgery

## 2016-01-23 DIAGNOSIS — M25552 Pain in left hip: Principal | ICD-10-CM

## 2016-01-23 DIAGNOSIS — M25551 Pain in right hip: Secondary | ICD-10-CM

## 2016-01-27 ENCOUNTER — Ambulatory Visit
Admission: RE | Admit: 2016-01-27 | Discharge: 2016-01-27 | Disposition: A | Discharge status: Home / Self Care | Payer: PRIVATE HEALTH INSURANCE | Source: Ambulatory Visit | Attending: Orthopedic Surgery | Admitting: Orthopedic Surgery

## 2016-01-27 DIAGNOSIS — M25551 Pain in right hip: Secondary | ICD-10-CM

## 2016-01-27 DIAGNOSIS — M25552 Pain in left hip: Principal | ICD-10-CM

## 2016-01-27 MED ORDER — IOPAMIDOL (ISOVUE-M 200) INJECTION 41%
1.0000 mL | Freq: Once | INTRAMUSCULAR | Status: AC
  Administered 2016-01-27: 1 mL via INTRA_ARTICULAR

## 2016-01-27 MED ORDER — METHYLPREDNISOLONE ACETATE 40 MG/ML INJ SUSP (RADIOLOG
120.0000 mg | Freq: Once | INTRAMUSCULAR | Status: AC
  Administered 2016-01-27: 120 mg via INTRA_ARTICULAR

## 2016-07-12 ENCOUNTER — Other Ambulatory Visit: Payer: Self-pay | Admitting: Family Medicine

## 2016-07-12 DIAGNOSIS — R519 Headache, unspecified: Secondary | ICD-10-CM

## 2016-07-12 DIAGNOSIS — R51 Headache: Principal | ICD-10-CM

## 2016-07-17 ENCOUNTER — Ambulatory Visit
Admission: RE | Admit: 2016-07-17 | Discharge: 2016-07-17 | Disposition: A | Discharge status: Home / Self Care | Payer: PRIVATE HEALTH INSURANCE | Source: Ambulatory Visit | Attending: Family Medicine | Admitting: Family Medicine

## 2016-07-17 DIAGNOSIS — R51 Headache: Principal | ICD-10-CM

## 2016-07-17 DIAGNOSIS — R519 Headache, unspecified: Secondary | ICD-10-CM

## 2016-07-23 ENCOUNTER — Other Ambulatory Visit: Payer: Self-pay

## 2016-08-13 ENCOUNTER — Other Ambulatory Visit: Payer: Self-pay | Admitting: Family Medicine

## 2016-08-13 DIAGNOSIS — K869 Disease of pancreas, unspecified: Secondary | ICD-10-CM

## 2016-08-26 ENCOUNTER — Ambulatory Visit
Admission: RE | Admit: 2016-08-26 | Discharge: 2016-08-26 | Disposition: A | Discharge status: Home / Self Care | Payer: PRIVATE HEALTH INSURANCE | Source: Ambulatory Visit | Attending: Family Medicine | Admitting: Family Medicine

## 2016-08-26 DIAGNOSIS — K869 Disease of pancreas, unspecified: Secondary | ICD-10-CM

## 2016-08-26 MED ORDER — GADOBENATE DIMEGLUMINE 529 MG/ML IV SOLN
17.0000 mL | Freq: Once | INTRAVENOUS | Status: AC | PRN
  Administered 2016-08-26: 17 mL via INTRAVENOUS

## 2016-10-05 ENCOUNTER — Encounter: Payer: Self-pay | Admitting: Neurology

## 2016-10-05 ENCOUNTER — Ambulatory Visit (INDEPENDENT_AMBULATORY_CARE_PROVIDER_SITE_OTHER): Payer: PRIVATE HEALTH INSURANCE | Admitting: Neurology

## 2016-10-05 VITALS — BP 152/88 | HR 90 | Ht 69.0 in | Wt 187.8 lb

## 2016-10-05 DIAGNOSIS — G4484 Primary exertional headache: Secondary | ICD-10-CM | POA: Diagnosis not present

## 2016-10-05 DIAGNOSIS — I6509 Occlusion and stenosis of unspecified vertebral artery: Secondary | ICD-10-CM

## 2016-10-05 NOTE — Patient Instructions (Signed)
Remember to drink plenty of fluid, eat healthy meals and do not skip any meals. Try to eat protein with a every meal and eat a healthy snack such as fruit or nuts in between meals. Try to keep a regular sleep-wake schedule and try to exercise daily, particularly in the form of walking, 20-30 minutes a day, if you can.   As far as diagnostic testing: Lab, CTA head and neck  I would like to see you back as needed, sooner if we need to. Please call us with any interim questions, concerns, problems, updates or refill requests.   Our phone number is [phone]. We also have an after hours call service for urgent matters and there is a physician on-call for urgent questions. For any emergencies you know to call 911 or go to the nearest emergency room

## 2016-10-05 NOTE — Progress Notes (Signed)
[facility]    Provider:  Dr Jaynee Eagles Referring Provider: Gaynelle Arabian, MD Primary Care Physician:  Gaynelle Arabian, MD  CC:  Headache and also here to discuss MRI/MRA findings  HPI:  Leonard [name] is a 56 y.o. male here as a referral from Dr. Marisue Humble for new left vertebral artery and MRI and headache. Past medical history of alcohol abuse, lumbar disc disease, iliac artery aneurysms, vitamin D deficiency, fatty liver, hypercholesterolemia, anxiety, cervical disc disease. Ongoing alcohol abuse. He started having headaches when he started spinning on a bike, a lot more exercise than he has done in a long time, started getting headaches when he is "going very hard" with heart rate over 130, sweating a breathing hard, headache not debilitating just a little painful, throbbing, resolves when he gets off the bike, it has improved lately rides the bike 3x a week. Hasn't had the headache in 3 weeks and he is still working hard. No dizziness or vertigo. No other focal neurologic deficits, associated symptoms, inciting events or modifiable factors.  Reviewed notes, labs and imaging from outside physicians, which showed: Personally reviewed MRI of the brain and MRA of the head images and agree with the following:  IMPRESSION: Interval slow flow or no flow of the left vertebral artery between the V3 and V4 segments. The left distal V4 segment and PICA enhance, possibly retrograde. No associated acute or remote infarct; relationship to patient's left-sided headache is uncertain.  Reviewed primary care notes, patient has a history of alcohol abuse. Patient has a history of lumbar surgery, difficulty sleeping, insomnia, history of anxiety and depression in the past has been on sertraline is following with a psychologist. Also tried Ambien and Remeron in the past for his anxiety and insomnia. He had oxycodone withdrawal and since then his insomnia has improved. Lipid panels were checked,  vitamin D is rechecked, CMP were checked as well. LDL is 113, vitamin D was normal, CMP with BUN 20 and creatinine 1.1 labs redrawn in May 2018,   Review of Systems: Patient complains of symptoms per HPI as well as the following symptoms: headache, numbness, dizziness, anxiety, jpint pain, allergies. Pertinent negatives and positives per HPI. All others negative.   Social History   Social History  . Marital status: Married    Spouse name: N/A  . Number of children: 0  . Years of education: 83   Occupational History  . [employer]    Social History Main Topics  . Smoking status: Former Smoker    Packs/day: 0.50    Years: 2.00    Types: Cigarettes  . Smokeless tobacco: Never Used     Comment: smoked while he was in high school  . Alcohol use Yes     Comment: 2 glasses red wine nightly  . Drug use: No  . Sexual activity: Yes   Other Topics Concern  . Not on file   Social History Narrative   ** Merged History Encounter **   Lives at home w/ his wife   Right-handed   Caffeine: 3 cups coffee per day    Family History  Problem Relation Age of Onset  . Arthritis Mother   . Cirrhosis Father   . Stroke Neg Hx     Past Medical History:  Diagnosis Date  . Abnormal EKG    anterolateral T wave inversion  . Anxiety   . Chronic back pain    spondylolisthesis,spondylosis,DDD,stenosis  . Depression   . Dizziness   .  GERD (gastroesophageal reflux disease)    occasionally and takes Pepcid if needed  . Headache(784.0)    minor and occasionally  . Hyperlipidemia    was on meds but has been off x 5 months d/t elevated liver enzymes  . Hyperlipidemia    statin intolerant because of elevated liver function tests  . Insomnia    takes Trazodone nightly  . Numbness and tingling of both legs   . Opiate addiction ([place])   . PONV (postoperative nausea and vomiting)   . Weakness    pain and occasionally tingling in right arm and leg    Past Surgical History:    Procedure Laterality Date  . APPENDECTOMY  as a child  . BACK SURGERY  2014, 2015  . CERVICAL FUSION  2003, 2007   x 2  . COLONOSCOPY    . KNEE SURGERY Right 2011  . LEG SURGERY Right as a child   compound fracture  . radial nerve impingement Right 2009    Current Outpatient Prescriptions  Medication Sig Dispense Refill  . Cholecalciferol (VITAMIN D3) 2000 units capsule Take by mouth.    Marland Kitchen ibuprofen (ADVIL,MOTRIN) 200 MG tablet Take 600 mg by mouth 2 (two) times daily as needed.    . sertraline (ZOLOFT) 50 MG tablet Take 75 mg by mouth 2 (two) times daily.     . simvastatin (ZOCOR) 40 MG tablet Take 20 mg by mouth daily.     No current facility-administered medications for this visit.     Allergies as of 10/05/2016 - Review Complete 10/05/2016  Allergen Reaction Noted  . Sulfa antibiotics Swelling and Rash 08/10/2011    Vitals: BP (!) 152/88   Pulse 90   Ht 5\' 9"  (1.753 m)   Wt 187 lb 12.8 oz (85.2 kg)   BMI 27.73 kg/m  Last Weight:  Wt Readings from Last 1 Encounters:  10/05/16 187 lb 12.8 oz (85.2 kg)   Last Height:   Ht Readings from Last 1 Encounters:  10/05/16 5\' 9"  (1.753 m)    Physical exam: Exam: Gen: NAD, conversant, well nourised, obese, well groomed                     CV: RRR, no MRG. No Carotid Bruits. No peripheral edema, warm, nontender Eyes: Conjunctivae clear without exudates or hemorrhage  Neuro: Detailed Neurologic Exam  Speech:    Speech is normal; fluent and spontaneous with normal comprehension.  Cognition:    The patient is oriented to person, place, and time;     recent and remote memory intact;     language fluent;     normal attention, concentration,     fund of knowledge Cranial Nerves:    The pupils are equal, round, and reactive to light. The fundi are normal and spontaneous venous pulsations are present. Visual fields are full to finger confrontation. Extraocular movements are intact. Trigeminal sensation is intact and the  muscles of mastication are normal. The face is symmetric. The palate elevates in the midline. Hearing intact. Voice is normal. Shoulder shrug is normal. The tongue has normal motion without fasciculations.   Coordination:    Normal finger to nose and heel to shin. Normal rapid alternating movements.   Gait:    Heel-toe and tandem gait with mild difficulty   Motor Observation:    No asymmetry, no atrophy. Mild postural termor Tone:    Normal muscle tone.    Posture:    Posture is normal. normal  erect    Strength:    Strength is V/V in the upper and lower limbs.      Sensation: intact to LT. Romberg neg     Reflex Exam:  DTR's:   Absent AJs and right patellar y.   Toes:    The toes are downgoing bilaterally.   Clonus:    Clonus is absent.      Assessment/Plan:  56 year old patient with exertional headaches after starting the new intense workout regimen, these headaches have resolved in the last 3 weeks. MRI of the brain was unremarkable however MRI of the head showed diminished flow in the vertebral artery which is asymptomatic. Discussed vascular risk factors, recommended no more than one alcoholic drink a day, discussed other vascular risk factors that can cause atherosclerosis such as cholesterol, diabetes and high blood pressure and recommend close follow-up with primary care for these. We'll order a CT angiogram of the head and neck to further examine the vertebral artery as these are more sensitive and specific; MRI of the brain pellets and blood flow could be misleading. Continue ASA daily for stroke prevention.  Orders Placed This Encounter  Procedures  . CT ANGIO HEAD W OR WO CONTRAST  . CT ANGIO NECK W OR WO CONTRAST  . Basic Metabolic Panel    Cc: Gaynelle Arabian, MD  Sarina Ill, MD  [facility] [address] [place], [place] [address]  Phone [phone] Fax [phone]

## 2016-10-06 LAB — BASIC METABOLIC PANEL
BUN/Creatinine Ratio: 21 — ABNORMAL HIGH (ref 9–20)
BUN: 21 mg/dL (ref 6–24)
CO2: 23 mmol/L (ref 20–29)
Calcium: 9.7 mg/dL (ref 8.7–10.2)
Chloride: 103 mmol/L (ref 96–106)
Creatinine, Ser: 0.99 mg/dL (ref 0.76–1.27)
GFR calc Af Amer: 97 mL/min/{1.73_m2} (ref >59)
GFR calc non Af Amer: 84 mL/min/{1.73_m2} (ref >59)
Glucose: 99 mg/dL (ref 65–99)
Potassium: 5.2 mmol/L (ref 3.5–5.2)
Sodium: 140 mmol/L (ref 134–144)

## 2016-10-07 ENCOUNTER — Telehealth: Payer: Self-pay | Admitting: *Deleted

## 2016-10-07 NOTE — Telephone Encounter (Signed)
-----   Message from Melvenia Beam, MD sent at 10/06/2016 11:22 AM EDT ----- Unremarkable labs thanks

## 2016-10-07 NOTE — Telephone Encounter (Signed)
Called and LVM for pt about unremarkable labs per AA,MD note.  Advised order for CT sent to [facility]. They will call to schedule. Advised him to call back if he does not hear about scheduling. Gave [facility] phone number if he has further questions.

## 2016-10-14 ENCOUNTER — Other Ambulatory Visit: Payer: Self-pay | Admitting: Neurology

## 2016-10-15 ENCOUNTER — Telehealth: Payer: Self-pay | Admitting: Neurology

## 2016-10-15 ENCOUNTER — Ambulatory Visit
Admission: RE | Admit: 2016-10-15 | Discharge: 2016-10-15 | Disposition: A | Discharge status: Home / Self Care | Payer: PRIVATE HEALTH INSURANCE | Source: Ambulatory Visit | Attending: Neurology | Admitting: Neurology

## 2016-10-15 DIAGNOSIS — G4484 Primary exertional headache: Secondary | ICD-10-CM

## 2016-10-15 DIAGNOSIS — R519 Headache, unspecified: Secondary | ICD-10-CM

## 2016-10-15 DIAGNOSIS — I6509 Occlusion and stenosis of unspecified vertebral artery: Secondary | ICD-10-CM

## 2016-10-15 DIAGNOSIS — I7771 Dissection of carotid artery: Secondary | ICD-10-CM

## 2016-10-15 DIAGNOSIS — R51 Headache: Secondary | ICD-10-CM

## 2016-10-15 MED ORDER — IOPAMIDOL (ISOVUE-370) INJECTION 76%
75.0000 mL | Freq: Once | INTRAVENOUS | Status: AC | PRN
  Administered 2016-10-15: 75 mL via INTRAVENOUS

## 2016-10-15 NOTE — Telephone Encounter (Signed)
Leonard [name]:  Pt has an abn CTA neck with a short segment extracranial L ICA dissection, no stroke, no new findings.  Pt has multiple vasc risk factors and recently saw Dr. Jaynee Eagles.  I spoke with the radiologist, Dr. Collins Scotland.  I would recommend vascular surgery input.  Please advise patient that I place referral to vasc surgery. FU with Dr. Jaynee Eagles as planned, if no FU pending, please arrange FU appt too.   Looks like he was advised to cont with ASA and limit EtOH and was advised re: stroke prevention during appt recently with Dr. Jaynee Eagles.  I tried calling both listed home and cell numbers to talk to patient about above, but no answer on both numbers.

## 2016-10-16 NOTE — Telephone Encounter (Signed)
Delsa Sale can you take care of this Monday, place the referral and call patient? Hinton Dyer, can you call Vascular and see about getting him in sooner? He has a carotid dissection, thanks

## 2016-10-18 NOTE — Telephone Encounter (Signed)
Referral has been sent. [facility] referral thanks Hinton Dyer.

## 2016-10-19 NOTE — Telephone Encounter (Signed)
Called pt w/ CTA results and recommendations. Agreeable to vascular referral, verbalized understanding and appreciation for call.

## 2016-10-21 NOTE — Telephone Encounter (Signed)
Returned call to pt's wife and answered questions about CTA results and vascular referral. Explained that he does have a carotid artery dissection which is a tear/separation in the artery walls. This can increase his risk of forming clots and possibly travel to the brain resulting in a stroke. Treatments include observation, anticoagulation, stent implantation and carotid artery ligation for which pt has been referred to vascular. Advised to continue daily aspirin and encouraged not to exercise vigorously until after being evaluated by Dr. Oneida Alar (vascular surgery). She verbalized understanding and agreed to reassure/reeducate pt. They will call back if any additional questions/concerns arise.

## 2016-10-21 NOTE — Telephone Encounter (Signed)
Patients wife Suanne Marker (listed on [place]) would like to speak with RN in reference to vascular office.  Suanne Marker states the results from CTA caught the patient off guard and has him a little worried.  Please call Suanne Marker to discuss please.

## 2016-10-25 ENCOUNTER — Other Ambulatory Visit: Payer: Self-pay

## 2016-10-25 DIAGNOSIS — I7771 Dissection of carotid artery: Secondary | ICD-10-CM

## 2016-11-16 ENCOUNTER — Encounter: Payer: Self-pay | Admitting: Vascular Surgery

## 2016-11-18 ENCOUNTER — Ambulatory Visit (INDEPENDENT_AMBULATORY_CARE_PROVIDER_SITE_OTHER): Payer: PRIVATE HEALTH INSURANCE | Admitting: Vascular Surgery

## 2016-11-18 ENCOUNTER — Ambulatory Visit (HOSPITAL_COMMUNITY)
Admission: RE | Admit: 2016-11-18 | Discharge: 2016-11-18 | Disposition: A | Discharge status: Home / Self Care | Payer: PRIVATE HEALTH INSURANCE | Source: Ambulatory Visit | Attending: Vascular Surgery | Admitting: Vascular Surgery

## 2016-11-18 ENCOUNTER — Encounter: Payer: Self-pay | Admitting: Vascular Surgery

## 2016-11-18 VITALS — BP 139/96 | HR 78 | Temp 97.0°F | Ht 70.0 in | Wt 190.0 lb

## 2016-11-18 DIAGNOSIS — I6523 Occlusion and stenosis of bilateral carotid arteries: Secondary | ICD-10-CM | POA: Diagnosis not present

## 2016-11-18 DIAGNOSIS — I7771 Dissection of carotid artery: Secondary | ICD-10-CM | POA: Insufficient documentation

## 2016-11-18 LAB — VAS US CAROTID
LEFT ECA DIAS: 11 cm/s
Left CCA dist dias: -23 cm/s
Left CCA dist sys: -76 cm/s
Left CCA prox dias: 22 cm/s
Left CCA prox sys: 82 cm/s
Left ICA dist dias: -21 cm/s
Left ICA dist sys: -53 cm/s
Left ICA prox dias: -25 cm/s
Left ICA prox sys: -64 cm/s
RIGHT CCA MID DIAS: 20 cm/s
RIGHT ECA DIAS: -15 cm/s
RIGHT VERTEBRAL DIAS: 18 cm/s
Right CCA prox dias: 11 cm/s
Right CCA prox sys: 58 cm/s
Right cca dist sys: 58 cm/s

## 2016-11-18 NOTE — Progress Notes (Signed)
Referring Physician: Dr Jaynee Eagles  Patient name: Leonard [name] MRN: [id number] DOB: Dec 22, 1960 Sex: male  REASON FOR CONSULT: left carotid dissection  HPI: Leonard [name] is a 56 y.o. male referred for evaluation of a left internal carotid artery dissection. About 2 months ago, the patient was riding a stationary bike doing a very intense workout. He then developed right posterior headaches. He underwent an extensive evaluation of this. This included an MRI of the brain with MRA. This showed minimal flow in the left vertebral artery and was otherwise normal. He then had a CT angiogram of the head and neck in August 2018. This showed a left internal carotid artery dissection beginning at the left carotid bifurcation. This was not flow-limiting. The patient denies any symptoms of TIA amaurosis or stroke. He currently is on aspirin and a statin. He no longer has headaches. He works as a Teaching laboratory technician type position Other medical problems include chronic back pain, hyperlipidemia, both of which are stable.  He has a history of a 2 cm iliac aneurysm which is followed by his primary care physician.  Past Medical History:  Diagnosis Date  . Abnormal EKG    anterolateral T wave inversion  . Anxiety   . Chronic back pain    spondylolisthesis,spondylosis,DDD,stenosis  . Depression   . Dizziness   . GERD (gastroesophageal reflux disease)    occasionally and takes Pepcid if needed  . Headache(784.0)    minor and occasionally  . Hyperlipidemia    was on meds but has been off x 5 months d/t elevated liver enzymes  . Hyperlipidemia    statin intolerant because of elevated liver function tests  . Insomnia    takes Trazodone nightly  . Numbness and tingling of both legs   . Opiate addiction ([place])   . PONV (postoperative nausea and vomiting)   . Weakness    pain and occasionally tingling in right arm and leg   Past Surgical History:  Procedure Laterality Date  . APPENDECTOMY   as a child  . BACK SURGERY  2014, 2015  . CERVICAL FUSION  2003, 2007   x 2  . COLONOSCOPY    . KNEE SURGERY Right 2011  . LEG SURGERY Right as a child   compound fracture  . radial nerve impingement Right 2009    Family History  Problem Relation Age of Onset  . Arthritis Mother   . Cirrhosis Father   . Stroke Neg Hx   No family history of carotid dissection.  SOCIAL HISTORY: Social History   Social History  . Marital status: Married    Spouse name: N/A  . Number of children: 0  . Years of education: 59   Occupational History  . [employer]    Social History Main Topics  . Smoking status: Former Smoker    Packs/day: 0.50    Years: 2.00    Types: Cigarettes  . Smokeless tobacco: Never Used     Comment: smoked while he was in high school  . Alcohol use Yes     Comment: 2 glasses red wine nightly  . Drug use: No  . Sexual activity: Yes   Other Topics Concern  . Not on file   Social History Narrative   ** Merged History Encounter **   Lives at home w/ his wife   Right-handed   Caffeine: 3 cups coffee per day    Allergies  Allergen Reactions  . Sulfa  Antibiotics Swelling and Rash    Current Outpatient Prescriptions  Medication Sig Dispense Refill  . aspirin 81 MG chewable tablet Chew by mouth daily.    . Cholecalciferol (VITAMIN D3) 2000 units capsule Take by mouth.    Marland Kitchen ibuprofen (ADVIL,MOTRIN) 200 MG tablet Take 600 mg by mouth 2 (two) times daily as needed.    . sertraline (ZOLOFT) 50 MG tablet Take 75 mg by mouth 2 (two) times daily.     . simvastatin (ZOCOR) 40 MG tablet Take 20 mg by mouth daily.     No current facility-administered medications for this visit.     ROS:   General:  No weight loss, Fever, chills  HEENT: No recent headaches, no nasal bleeding, no visual changes, no sore throat  Neurologic: No dizziness, blackouts, seizures. No recent symptoms of stroke or mini- stroke. No recent episodes of slurred speech, or  temporary blindness.  Cardiac: No recent episodes of chest pain/pressure, no shortness of breath at rest.  No shortness of breath with exertion.  Denies history of atrial fibrillation or irregular heartbeat  Vascular: No history of rest pain in feet.  No history of claudication.  No history of non-healing ulcer, No history of DVT   Pulmonary: No home oxygen, no productive cough, no hemoptysis,  No asthma or wheezing  Musculoskeletal:  [ ]  Arthritis, [X]  Low back pain,  [ ]  Joint pain  Hematologic:No history of hypercoagulable state.  No history of easy bleeding.  No history of anemia  Gastrointestinal: No hematochezia or melena,  No gastroesophageal reflux, no trouble swallowing  Urinary: [ ]  chronic Kidney disease, [ ]  on HD - [ ]  MWF or [ ]  TTHS, [ ]  Burning with urination, [ ]  Frequent urination, [ ]  Difficulty urinating;   Skin: No rashes  Psychological: No history of anxiety,  No history of depression   Physical Examination  Vitals:   11/18/16 1458  BP: (!) 139/96  Pulse: 78  Temp: (!) 97 F (36.1 C)  TempSrc: Oral  SpO2: (!) 78%  Weight: 190 lb (86.2 kg)  Height: 5\' 10"  (1.778 m)    Body mass index is 27.26 kg/m.  General:  Alert and oriented, no acute distress HEENT: Normal Neck: No bruit or JVD Pulmonary: Clear to auscultation bilaterally Cardiac: Regular Rate and Rhythm without murmur Abdomen: Soft, non-tender, non-distended, no mass Skin: No rash Extremity Pulses:  2+ radial, brachial, femoral, dorsalis pedis, posterior tibial pulses bilaterally Musculoskeletal: No deformity or edema  Neurologic: Upper and lower extremity motor 5/5 and symmetric  DATA:  Patient had a carotid duplex exam today which showed less than 40% stenosis bilaterally. There was a short segment of left internal carotid artery which appeared to have a small area of nonflow limiting dissection. I reviewed and interpreted this study.  I also reviewed the images of the CT angiogram which  again shows a short segment dissection at the level of the carotid bifurcation. It does not extend in the skull base. There is no evidence of thrombus.  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   PLAN: Continue aspirin daily. We will obtain a follow-up CT angiogram in 6 months to see if the dissection has fully healed. I told the patient to avoid any strenuous exercise that is going to cause him a Valsalva type maneuver to prevent any extension of the dissection until we see him back after his CT angiogram. Patient was also counseled today to stop using  tobacco products. He currently uses chewing tobacco. I told him to avoid this.  Ruta Hinds, MD [facility] Office: [phone] Pager: [phone]    Ruta Hinds, MD [facility]: [phone] Pager: [phone]

## 2016-12-02 NOTE — Addendum Note (Signed)
Addended by: Lianne Cure A on: 12/02/2016 02:57 PM   Modules accepted: Orders

## 2017-05-11 ENCOUNTER — Telehealth: Payer: Self-pay

## 2017-05-11 NOTE — Telephone Encounter (Signed)
I contacted [place] today to get pre authorization for CTA Neck. Josem Kaufmann is now in clinical review, office visit notes have been faxed to [phone] for review. Awaiting response.

## 2017-05-24 ENCOUNTER — Ambulatory Visit
Admission: RE | Admit: 2017-05-24 | Discharge: 2017-05-24 | Disposition: A | Discharge status: Home / Self Care | Payer: PRIVATE HEALTH INSURANCE | Source: Ambulatory Visit | Attending: Vascular Surgery | Admitting: Vascular Surgery

## 2017-05-24 DIAGNOSIS — I7771 Dissection of carotid artery: Secondary | ICD-10-CM

## 2017-05-24 MED ORDER — IOPAMIDOL (ISOVUE-370) INJECTION 76%
75.0000 mL | Freq: Once | INTRAVENOUS | Status: AC | PRN
  Administered 2017-05-24: 75 mL via INTRAVENOUS

## 2017-05-26 ENCOUNTER — Encounter: Payer: Self-pay | Admitting: *Deleted

## 2017-05-26 ENCOUNTER — Encounter: Payer: Self-pay | Admitting: Vascular Surgery

## 2017-05-26 ENCOUNTER — Ambulatory Visit (INDEPENDENT_AMBULATORY_CARE_PROVIDER_SITE_OTHER): Payer: PRIVATE HEALTH INSURANCE | Admitting: Vascular Surgery

## 2017-05-26 ENCOUNTER — Other Ambulatory Visit: Payer: Self-pay

## 2017-05-26 VITALS — BP 152/90 | HR 51 | Resp 20 | Ht 70.0 in | Wt 195.5 lb

## 2017-05-26 DIAGNOSIS — I7771 Dissection of carotid artery: Secondary | ICD-10-CM

## 2017-05-26 NOTE — Progress Notes (Signed)
Patient is a 57 year old male who returns for follow-up today.  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  He never had any symptoms of TIA amaurosis or stroke.  He also has a chronically occluded vertebral artery.  He returns for follow-up today with recent CTA to evaluate interval change.  He continues to deny any symptoms of TIA amaurosis or stroke.  He is a former smoker.  He currently takes aspirin 81 mg daily.  He is also on a statin.  He denies history of hypertension.  Review of systems: He denies shortness of breath.  He denies chest pain.  Current Outpatient Medications on File Prior to Visit  Medication Sig Dispense Refill  . aspirin 81 MG chewable tablet Chew by mouth daily.    . Cholecalciferol (VITAMIN D3) 2000 units capsule Take by mouth.    Marland Kitchen ibuprofen (ADVIL,MOTRIN) 200 MG tablet Take 600 mg by mouth 2 (two) times daily as needed.    . sertraline (ZOLOFT) 50 MG tablet Take 75 mg by mouth 2 (two) times daily.     . simvastatin (ZOCOR) 40 MG tablet Take 20 mg by mouth daily.     No current facility-administered medications on file prior to visit.    Social History   Socioeconomic History  . Marital status: Married    Spouse name: Not on file  . Number of children: 0  . Years of education: 3  . Highest education level: Not on file  Occupational History  . Occupation: [employer]  Social Needs  . Financial resource strain: Not on file  . Food insecurity:    Worry: Not on file    Inability: Not on file  . Transportation needs:    Medical: Not on file    Non-medical: Not on file  Tobacco Use  . Smoking status: Former Smoker    Packs/day: 0.50    Years: 2.00    Pack years: 1.00    Types: Cigarettes  . Smokeless tobacco: Never Used  . Tobacco comment: smoked while he was in high school  Substance and Sexual Activity  . Alcohol use: Yes    Comment: 2 glasses red wine nightly  . Drug use: No  . Sexual activity: Yes   Lifestyle  . Physical activity:    Days per week: Not on file    Minutes per session: Not on file  . Stress: Not on file  Relationships  . Social connections:    Talks on phone: Not on file    Gets together: Not on file    Attends religious service: Not on file    Active member of club or organization: Not on file    Attends meetings of clubs or organizations: Not on file    Relationship status: Not on file  . Intimate partner violence:    Fear of current or ex partner: Not on file    Emotionally abused: Not on file    Physically abused: Not on file    Forced sexual activity: Not on file  Other Topics Concern  . Not on file  Social History Narrative   ** Merged History Encounter **   Lives at home w/ his wife   Right-handed   Caffeine: 3 cups coffee per day   Physical exam:  Vitals:   05/26/17 1605  BP: (!) 152/90  Pulse: (!) 51  Resp: 20  SpO2: 96%  Weight: 195 lb 8 oz (88.7 kg)  Height: 5'  10" (1.778 m)   Neuro: No facial asymmetry.  Normal gait upper extremity lower extremity motor strength 5/5 and symmetric  Data: Patient had a recent CT angiogram and I reviewed those images today.  He has dissection on the left side appears to have healed.  There is no significant flow limitation but the area of dissection is still present just after the takeoff of the internal carotid.  This is in a tortuous segment.  On the right side there was a filling defect which is difficult to know whether or not this is artifact secondary to dental work or an actual filling defect consistent with thrombus.  He does have changes in the internal carotid artery on the side that may be consistent with fibromuscular dysplasia.  Assessment: Healing dissection left internal carotid artery.  Chronically occluded left vertebral artery.  Possible new filling defect possible thrombus in the right carotid system.  Currently asymptomatic.  I offered the patient carotid angiogram tomorrow but he had several  conflicts with work and also his family.  Instead we have scheduled him for Monday June 30, 2017 by my partner Dr. Donzetta Matters.  Risk benefits possible complications and procedure details were discussed with the patient today including but not limited to bleeding infection stroke risk of 100,000.  He understands and agrees to proceed.  Plan: See above  Ruta Hinds, MD [facility] Office: [phone] Pager: [phone]

## 2017-05-27 ENCOUNTER — Encounter: Payer: Self-pay | Admitting: *Deleted

## 2017-05-30 ENCOUNTER — Ambulatory Visit (HOSPITAL_COMMUNITY)
Admission: RE | Admit: 2017-05-30 | Payer: PRIVATE HEALTH INSURANCE | Source: Ambulatory Visit | Admitting: Vascular Surgery

## 2017-05-30 ENCOUNTER — Encounter (HOSPITAL_COMMUNITY): Admission: RE | Payer: Self-pay | Source: Ambulatory Visit

## 2017-05-30 SURGERY — AORTIC ARCH ANGIOGRAPHY
Anesthesia: LOCAL

## 2017-05-31 ENCOUNTER — Other Ambulatory Visit: Payer: Self-pay | Admitting: Family Medicine

## 2017-05-31 DIAGNOSIS — M542 Cervicalgia: Secondary | ICD-10-CM

## 2017-06-07 ENCOUNTER — Ambulatory Visit
Admission: RE | Admit: 2017-06-07 | Discharge: 2017-06-07 | Disposition: A | Discharge status: Home / Self Care | Payer: PRIVATE HEALTH INSURANCE | Source: Ambulatory Visit | Attending: Family Medicine | Admitting: Family Medicine

## 2017-06-07 DIAGNOSIS — M542 Cervicalgia: Secondary | ICD-10-CM

## 2017-06-22 ENCOUNTER — Ambulatory Visit: Payer: PRIVATE HEALTH INSURANCE | Admitting: Neurology

## 2017-07-21 ENCOUNTER — Other Ambulatory Visit: Payer: Self-pay | Admitting: Family Medicine

## 2017-07-21 DIAGNOSIS — R748 Abnormal levels of other serum enzymes: Secondary | ICD-10-CM

## 2017-07-28 ENCOUNTER — Ambulatory Visit
Admission: RE | Admit: 2017-07-28 | Discharge: 2017-07-28 | Disposition: A | Discharge status: Home / Self Care | Payer: PRIVATE HEALTH INSURANCE | Source: Ambulatory Visit | Attending: Family Medicine | Admitting: Family Medicine

## 2017-07-28 DIAGNOSIS — R748 Abnormal levels of other serum enzymes: Secondary | ICD-10-CM

## 2017-08-02 ENCOUNTER — Other Ambulatory Visit: Payer: Self-pay | Admitting: Gastroenterology

## 2017-08-02 DIAGNOSIS — R748 Abnormal levels of other serum enzymes: Secondary | ICD-10-CM

## 2017-08-02 DIAGNOSIS — K8689 Other specified diseases of pancreas: Secondary | ICD-10-CM

## 2017-08-03 ENCOUNTER — Ambulatory Visit
Admission: RE | Admit: 2017-08-03 | Discharge: 2017-08-03 | Disposition: A | Discharge status: Home / Self Care | Payer: PRIVATE HEALTH INSURANCE | Source: Ambulatory Visit | Attending: Gastroenterology | Admitting: Gastroenterology

## 2017-08-03 DIAGNOSIS — K8689 Other specified diseases of pancreas: Secondary | ICD-10-CM

## 2017-08-03 DIAGNOSIS — K859 Acute pancreatitis without necrosis or infection, unspecified: Secondary | ICD-10-CM

## 2017-08-03 DIAGNOSIS — R748 Abnormal levels of other serum enzymes: Secondary | ICD-10-CM

## 2017-08-03 HISTORY — DX: Acute pancreatitis without necrosis or infection, unspecified: K85.90

## 2017-08-03 MED ORDER — GADOBENATE DIMEGLUMINE 529 MG/ML IV SOLN
15.0000 mL | Freq: Once | INTRAVENOUS | Status: AC | PRN
  Administered 2017-08-03: 15 mL via INTRAVENOUS

## 2017-08-04 ENCOUNTER — Inpatient Hospital Stay (HOSPITAL_COMMUNITY)
Admission: EM | Admit: 2017-08-04 | Discharge: 2017-08-08 | DRG: 438 | Disposition: A | Discharge status: Home / Self Care | Payer: PRIVATE HEALTH INSURANCE | Attending: Family Medicine | Admitting: Family Medicine

## 2017-08-04 ENCOUNTER — Encounter (HOSPITAL_COMMUNITY): Payer: Self-pay | Admitting: Emergency Medicine

## 2017-08-04 ENCOUNTER — Other Ambulatory Visit: Payer: Self-pay

## 2017-08-04 DIAGNOSIS — M542 Cervicalgia: Secondary | ICD-10-CM | POA: Diagnosis present

## 2017-08-04 DIAGNOSIS — M545 Low back pain: Secondary | ICD-10-CM | POA: Diagnosis present

## 2017-08-04 DIAGNOSIS — F10239 Alcohol dependence with withdrawal, unspecified: Secondary | ICD-10-CM | POA: Diagnosis not present

## 2017-08-04 DIAGNOSIS — K219 Gastro-esophageal reflux disease without esophagitis: Secondary | ICD-10-CM | POA: Diagnosis present

## 2017-08-04 DIAGNOSIS — K8591 Acute pancreatitis with uninfected necrosis, unspecified: Secondary | ICD-10-CM | POA: Diagnosis present

## 2017-08-04 DIAGNOSIS — F1020 Alcohol dependence, uncomplicated: Secondary | ICD-10-CM | POA: Diagnosis present

## 2017-08-04 DIAGNOSIS — F32A Depression, unspecified: Secondary | ICD-10-CM | POA: Diagnosis present

## 2017-08-04 DIAGNOSIS — Z79899 Other long term (current) drug therapy: Secondary | ICD-10-CM | POA: Diagnosis not present

## 2017-08-04 DIAGNOSIS — K8521 Alcohol induced acute pancreatitis with uninfected necrosis: Principal | ICD-10-CM | POA: Diagnosis present

## 2017-08-04 DIAGNOSIS — K863 Pseudocyst of pancreas: Secondary | ICD-10-CM | POA: Diagnosis present

## 2017-08-04 DIAGNOSIS — Z888 Allergy status to other drugs, medicaments and biological substances status: Secondary | ICD-10-CM

## 2017-08-04 DIAGNOSIS — M5410 Radiculopathy, site unspecified: Secondary | ICD-10-CM | POA: Diagnosis present

## 2017-08-04 DIAGNOSIS — Z981 Arthrodesis status: Secondary | ICD-10-CM

## 2017-08-04 DIAGNOSIS — Z8659 Personal history of other mental and behavioral disorders: Secondary | ICD-10-CM | POA: Diagnosis not present

## 2017-08-04 DIAGNOSIS — R7989 Other specified abnormal findings of blood chemistry: Secondary | ICD-10-CM | POA: Diagnosis present

## 2017-08-04 DIAGNOSIS — R945 Abnormal results of liver function studies: Secondary | ICD-10-CM

## 2017-08-04 DIAGNOSIS — Z7982 Long term (current) use of aspirin: Secondary | ICD-10-CM

## 2017-08-04 DIAGNOSIS — I7771 Dissection of carotid artery: Secondary | ICD-10-CM | POA: Diagnosis present

## 2017-08-04 DIAGNOSIS — Z9049 Acquired absence of other specified parts of digestive tract: Secondary | ICD-10-CM

## 2017-08-04 DIAGNOSIS — F419 Anxiety disorder, unspecified: Secondary | ICD-10-CM | POA: Diagnosis present

## 2017-08-04 DIAGNOSIS — G8929 Other chronic pain: Secondary | ICD-10-CM | POA: Diagnosis present

## 2017-08-04 DIAGNOSIS — Z23 Encounter for immunization: Secondary | ICD-10-CM

## 2017-08-04 DIAGNOSIS — Z87891 Personal history of nicotine dependence: Secondary | ICD-10-CM

## 2017-08-04 DIAGNOSIS — K852 Alcohol induced acute pancreatitis without necrosis or infection: Secondary | ICD-10-CM | POA: Diagnosis present

## 2017-08-04 DIAGNOSIS — M4800 Spinal stenosis, site unspecified: Secondary | ICD-10-CM | POA: Diagnosis present

## 2017-08-04 DIAGNOSIS — Z882 Allergy status to sulfonamides status: Secondary | ICD-10-CM

## 2017-08-04 DIAGNOSIS — K859 Acute pancreatitis without necrosis or infection, unspecified: Secondary | ICD-10-CM | POA: Diagnosis present

## 2017-08-04 DIAGNOSIS — K760 Fatty (change of) liver, not elsewhere classified: Secondary | ICD-10-CM | POA: Diagnosis present

## 2017-08-04 DIAGNOSIS — F10230 Alcohol dependence with withdrawal, uncomplicated: Secondary | ICD-10-CM | POA: Diagnosis not present

## 2017-08-04 DIAGNOSIS — F329 Major depressive disorder, single episode, unspecified: Secondary | ICD-10-CM | POA: Diagnosis present

## 2017-08-04 DIAGNOSIS — R1013 Epigastric pain: Secondary | ICD-10-CM

## 2017-08-04 HISTORY — DX: Unspecified malignant neoplasm of skin, unspecified: C44.90

## 2017-08-04 HISTORY — DX: Unspecified osteoarthritis, unspecified site: M19.90

## 2017-08-04 HISTORY — DX: Other chronic pain: G89.29

## 2017-08-04 HISTORY — DX: Low back pain: M54.5

## 2017-08-04 HISTORY — DX: Low back pain, unspecified: M54.50

## 2017-08-04 LAB — URINALYSIS, ROUTINE W REFLEX MICROSCOPIC
Bilirubin Urine: NEGATIVE
Glucose, UA: 150 mg/dL — AB
Hgb urine dipstick: NEGATIVE
Ketones, ur: 5 mg/dL — AB
Leukocytes, UA: NEGATIVE
Nitrite: NEGATIVE
Protein, ur: NEGATIVE mg/dL
Specific Gravity, Urine: 1.014 (ref 1.005–1.030)
pH: 6 (ref 5.0–8.0)

## 2017-08-04 LAB — CBC
HCT: 38.9 % — ABNORMAL LOW (ref 39.0–52.0)
Hemoglobin: 13.2 g/dL (ref 13.0–17.0)
MCH: 32.9 pg (ref 26.0–34.0)
MCHC: 33.9 g/dL (ref 30.0–36.0)
MCV: 97 fL (ref 78.0–100.0)
Platelets: 196 10*3/uL (ref 150–400)
RBC: 4.01 MIL/uL — ABNORMAL LOW (ref 4.22–5.81)
RDW: 13.7 % (ref 11.5–15.5)
WBC: 4.7 10*3/uL (ref 4.0–10.5)

## 2017-08-04 LAB — COMPREHENSIVE METABOLIC PANEL
ALT: 138 U/L — ABNORMAL HIGH (ref 17–63)
AST: 193 U/L — ABNORMAL HIGH (ref 15–41)
Albumin: 3.7 g/dL (ref 3.5–5.0)
Alkaline Phosphatase: 236 U/L — ABNORMAL HIGH (ref 38–126)
Anion gap: 12 (ref 5–15)
BUN: 7 mg/dL (ref 6–20)
CO2: 25 mmol/L (ref 22–32)
Calcium: 9.3 mg/dL (ref 8.9–10.3)
Chloride: 103 mmol/L (ref 101–111)
Creatinine, Ser: 0.75 mg/dL (ref 0.61–1.24)
GFR calc Af Amer: >60 mL/min (ref >60)
GFR calc non Af Amer: >60 mL/min (ref >60)
Glucose, Bld: 163 mg/dL — ABNORMAL HIGH (ref 65–99)
Potassium: 4.2 mmol/L (ref 3.5–5.1)
Sodium: 140 mmol/L (ref 135–145)
Total Bilirubin: 1 mg/dL (ref 0.3–1.2)
Total Protein: 7.4 g/dL (ref 6.5–8.1)

## 2017-08-04 LAB — LIPASE, BLOOD: Lipase: 49 U/L (ref 11–51)

## 2017-08-04 MED ORDER — FENTANYL CITRATE (PF) 100 MCG/2ML IJ SOLN
25.0000 ug | INTRAMUSCULAR | Status: DC | PRN
  Administered 2017-08-05: 50 ug via INTRAVENOUS
  Filled 2017-08-04 (×2): qty 2

## 2017-08-04 MED ORDER — FAMOTIDINE IN NACL 20-0.9 MG/50ML-% IV SOLN
20.0000 mg | Freq: Two times a day (BID) | INTRAVENOUS | Status: DC
  Administered 2017-08-05 – 2017-08-06 (×4): 20 mg via INTRAVENOUS
  Filled 2017-08-04 (×4): qty 50

## 2017-08-04 MED ORDER — ENOXAPARIN SODIUM 40 MG/0.4ML ~~LOC~~ SOLN
40.0000 mg | SUBCUTANEOUS | Status: DC
  Administered 2017-08-05 – 2017-08-08 (×4): 40 mg via SUBCUTANEOUS
  Filled 2017-08-04 (×5): qty 0.4

## 2017-08-04 MED ORDER — ONDANSETRON HCL 4 MG/2ML IJ SOLN: 4.0000 mg | Freq: Four times a day (QID) | INTRAMUSCULAR | Status: DC | PRN

## 2017-08-04 MED ORDER — VITAMIN B-1 100 MG PO TABS
100.0000 mg | ORAL_TABLET | Freq: Every day | ORAL | Status: DC
  Administered 2017-08-05 – 2017-08-08 (×3): 100 mg via ORAL
  Filled 2017-08-04 (×3): qty 1

## 2017-08-04 MED ORDER — PROMETHAZINE HCL 25 MG/ML IJ SOLN: 12.5000 mg | Freq: Four times a day (QID) | INTRAMUSCULAR | Status: DC | PRN

## 2017-08-04 MED ORDER — LORAZEPAM 2 MG/ML IJ SOLN
0.0000 mg | Freq: Two times a day (BID) | INTRAMUSCULAR | Status: DC
  Administered 2017-08-06 – 2017-08-07 (×3): 1 mg via INTRAVENOUS
  Filled 2017-08-04 (×3): qty 1

## 2017-08-04 MED ORDER — LORAZEPAM 2 MG/ML IJ SOLN
0.0000 mg | Freq: Four times a day (QID) | INTRAMUSCULAR | Status: AC
  Administered 2017-08-05: 2 mg via INTRAVENOUS
  Administered 2017-08-05: 4 mg via INTRAVENOUS
  Administered 2017-08-05: 2 mg via INTRAVENOUS
  Administered 2017-08-06 (×2): 1 mg via INTRAVENOUS
  Administered 2017-08-06: 2 mg via INTRAVENOUS
  Filled 2017-08-04: qty 2
  Filled 2017-08-04 (×5): qty 1

## 2017-08-04 MED ORDER — ACETAMINOPHEN 650 MG RE SUPP: 650.0000 mg | Freq: Four times a day (QID) | RECTAL | Status: DC | PRN

## 2017-08-04 MED ORDER — SODIUM CHLORIDE 0.9 % IV SOLN
INTRAVENOUS | Status: AC
  Administered 2017-08-05 (×2): via INTRAVENOUS

## 2017-08-04 MED ORDER — THIAMINE HCL 100 MG/ML IJ SOLN
100.0000 mg | Freq: Every day | INTRAMUSCULAR | Status: DC
  Administered 2017-08-06: 100 mg via INTRAVENOUS
  Filled 2017-08-04: qty 2

## 2017-08-04 MED ORDER — ONDANSETRON HCL 4 MG PO TABS: 4.0000 mg | ORAL_TABLET | Freq: Four times a day (QID) | ORAL | Status: DC | PRN

## 2017-08-04 MED ORDER — LORAZEPAM 2 MG/ML IJ SOLN
1.0000 mg | Freq: Four times a day (QID) | INTRAMUSCULAR | Status: AC | PRN
  Administered 2017-08-05 – 2017-08-07 (×4): 1 mg via INTRAVENOUS
  Filled 2017-08-04 (×4): qty 1

## 2017-08-04 MED ORDER — NAPHAZOLINE-GLYCERIN 0.012-0.2 % OP SOLN: 1.0000 [drp] | Freq: Four times a day (QID) | OPHTHALMIC | Status: DC | PRN

## 2017-08-04 MED ORDER — ADULT MULTIVITAMIN W/MINERALS CH
1.0000 | ORAL_TABLET | Freq: Every day | ORAL | Status: DC
  Administered 2017-08-05 – 2017-08-08 (×4): 1 via ORAL
  Filled 2017-08-04 (×4): qty 1

## 2017-08-04 MED ORDER — ACETAMINOPHEN 325 MG PO TABS
650.0000 mg | ORAL_TABLET | Freq: Four times a day (QID) | ORAL | Status: DC | PRN
  Administered 2017-08-08: 650 mg via ORAL
  Filled 2017-08-04: qty 2

## 2017-08-04 MED ORDER — LORAZEPAM 1 MG PO TABS: 1.0000 mg | ORAL_TABLET | Freq: Four times a day (QID) | ORAL | Status: AC | PRN

## 2017-08-04 MED ORDER — LORAZEPAM 2 MG/ML IJ SOLN
2.0000 mg | Freq: Once | INTRAMUSCULAR | Status: AC
  Administered 2017-08-04: 2 mg via INTRAVENOUS
  Filled 2017-08-04: qty 1

## 2017-08-04 MED ORDER — FOLIC ACID 1 MG PO TABS
1.0000 mg | ORAL_TABLET | Freq: Every day | ORAL | Status: DC
  Administered 2017-08-05 – 2017-08-08 (×4): 1 mg via ORAL
  Filled 2017-08-04 (×4): qty 1

## 2017-08-04 NOTE — ED Triage Notes (Signed)
Pt sent by Dr. Michail Sermon for admission, no orders for admission written. Pt reprots abdominal pain x2 weeks and nausea. Reports three days of vomiting two weeks ago. Hx of drinking 1-2 bottles of wine a day. Chronic pain in L neck and arm d/t ruptured disc, surgery already scheduled.

## 2017-08-04 NOTE — ED Provider Notes (Signed)
[facility] EMERGENCY DEPARTMENT Provider Note   CSN: [id number] Arrival date & time: 08/04/17  1846     History   Chief Complaint Chief Complaint  Patient presents with  . Abdominal Pain    HPI Leonard [name] is a 57 y.o. male.  The history is provided by the patient and medical records.  Abdominal Pain   This is a new problem. The current episode started more than 1 week ago. The problem occurs constantly. The problem has not changed since onset.The pain is associated with eating and alcohol use. The pain is located in the epigastric region. The quality of the pain is aching. The pain is severe. Associated symptoms include nausea and vomiting. Pertinent negatives include fever, dysuria, hematuria and arthralgias. The symptoms are aggravated by eating. Nothing relieves the symptoms. Past workup comments: [place], MRCP. His past medical history is significant for GERD.    Past Medical History:  Diagnosis Date  . Abnormal EKG    anterolateral T wave inversion  . Anxiety   . Carotid artery dissection ([place])   . Chronic back pain    spondylolisthesis,spondylosis,DDD,stenosis  . Depression   . Dizziness   . GERD (gastroesophageal reflux disease)    occasionally and takes Pepcid if needed  . Headache(784.0)    minor and occasionally  . Hyperlipidemia    was on meds but has been off x 5 months d/t elevated liver enzymes  . Hyperlipidemia    statin intolerant because of elevated liver function tests  . Insomnia    takes Trazodone nightly  . Numbness and tingling of both legs   . Opiate addiction ([place])   . PONV (postoperative nausea and vomiting)   . Weakness    pain and occasionally tingling in right arm and leg    Patient Active Problem List   Diagnosis Date Noted  . Acute pancreatitis 08/04/2017  . Elevated LFTs 08/04/2017  . Carotid artery dissection ([place]) 08/04/2017  . Chronic neck pain 08/04/2017  . Acute alcoholic pancreatitis 35/57/3220  . Hay  fever 07/18/2015  . Anxiety state 07/18/2015  . Fatty infiltration of liver 07/18/2015  . Insomnia 07/18/2015  . Anxiety and depression 04/02/2015  . Spondylolisthesis of lumbar region 09/10/2013  . Hyperlipidemia 09/05/2013  . Alcohol dependence ([place]) 08/20/2013  . Opiate dependence ([place]) 08/20/2013    Past Surgical History:  Procedure Laterality Date  . APPENDECTOMY  as a child  . BACK SURGERY  2014, 2015  . CERVICAL FUSION  2003, 2007   x 2  . COLONOSCOPY    . KNEE SURGERY Right 2011  . LEG SURGERY Right as a child   compound fracture  . radial nerve impingement Right 2009        Home Medications    Prior to Admission medications   Medication Sig Start Date End Date Taking? Authorizing Provider  aspirin 81 MG chewable tablet Chew 81 mg by mouth daily.    Yes [provider]  Cholecalciferol (VITAMIN D3) 2000 units capsule Take 2,000 Units by mouth daily.    Yes [provider]  Diclofenac Sodium 3 % GEL Apply topically See admin instructions. Apply topically daily as directed to painful sites 07/21/17  Yes [provider]  ibuprofen (ADVIL,MOTRIN) 200 MG tablet Take 600 mg by mouth 2 (two) times daily as needed (for pain).    Yes [provider]  naphazoline (NAPHCON) 0.1 % ophthalmic solution Place 1 drop into both eyes 4 (four) times daily as  needed for eye irritation or allergies.    Yes [provider]  sertraline (ZOLOFT) 50 MG tablet Take 75 mg by mouth 2 (two) times daily.    Yes [provider]    Family History Family History  Problem Relation Age of Onset  . Arthritis Mother   . Cirrhosis Father   . Stroke Neg Hx     Social History Social History   Tobacco Use  . Smoking status: Former Smoker    Packs/day: 0.50    Years: 2.00    Pack years: 1.00    Types: Cigarettes  . Smokeless tobacco: Never Used  . Tobacco comment: smoked while he was in high school  Substance Use Topics  . Alcohol use: Yes     Comment: 2 glasses red wine nightly  . Drug use: No     Allergies   Sulfa antibiotics and Zocor [simvastatin]   Review of Systems Review of Systems  Constitutional: Negative for chills and fever.  HENT: Negative for ear pain and sore throat.   Eyes: Negative for pain and visual disturbance.  Respiratory: Negative for cough and shortness of breath.   Cardiovascular: Negative for chest pain and palpitations.  Gastrointestinal: Positive for abdominal pain, nausea and vomiting.  Genitourinary: Negative for dysuria and hematuria.  Musculoskeletal: Negative for arthralgias and back pain.  Skin: Negative for color change and rash.  Neurological: Negative for seizures and syncope.  All other systems reviewed and are negative.    Physical Exam Updated Vital Signs BP 119/78 (BP Location: Right Arm)   Pulse (!) 116   Temp 98 F (36.7 C) (Oral)   Resp 18   Ht 5\' 9"  (1.753 m)   Wt 88.5 kg (195 lb)   SpO2 97%   BMI 28.80 kg/m   Physical Exam  Constitutional: He appears well-developed and well-nourished.  HENT:  Head: Normocephalic and atraumatic.  Eyes: Conjunctivae are normal.  Neck: Neck supple.  Cardiovascular: Regular rhythm.  No murmur heard. Tachycardic  Pulmonary/Chest: Effort normal and breath sounds normal. No respiratory distress.  Abdominal: Soft. He exhibits no distension. There is tenderness. There is no rebound and no guarding.  Mild epigastric tenderness  Musculoskeletal: He exhibits no edema.  Neurological: He is alert.  Skin: Skin is warm and dry.  Psychiatric: He has a normal mood and affect.  Nursing note and vitals reviewed.    ED Treatments / Results  Labs (all labs ordered are listed, but only abnormal results are displayed) Labs Reviewed  COMPREHENSIVE METABOLIC PANEL - Abnormal; Notable for the following components:      Result Value   Glucose, Bld 163 (*)    AST 193 (*)    ALT 138 (*)    Alkaline Phosphatase 236 (*)    All other  components within normal limits  CBC - Abnormal; Notable for the following components:   RBC 4.01 (*)    HCT 38.9 (*)    All other components within normal limits  URINALYSIS, ROUTINE W REFLEX MICROSCOPIC - Abnormal; Notable for the following components:   Glucose, UA 150 (*)    Ketones, ur 5 (*)    All other components within normal limits  LIPASE, BLOOD  HIV ANTIBODY (ROUTINE TESTING)  COMPREHENSIVE METABOLIC PANEL  CBC WITH DIFFERENTIAL/PLATELET    EKG None  Radiology Mr Abdomen Mrcp W Wo Contast  Result Date: 08/03/2017 CLINICAL DATA:  Abdominal pain with nausea and vomiting for 1 month. Possible pancreatic mass on recent ultrasound. EXAM: MRI  ABDOMEN WITHOUT AND WITH CONTRAST (INCLUDING MRCP) TECHNIQUE: Multiplanar multisequence MR imaging of the abdomen was performed both before and after the administration of intravenous contrast. Heavily T2-weighted images of the biliary and pancreatic ducts were obtained, and three-dimensional MRCP images were rendered by post processing. CONTRAST:  32mL MULTIHANCE GADOBENATE DIMEGLUMINE 529 MG/ML IV SOLN COMPARISON:  08/26/2016 and 08/01/2015 FINDINGS: Lower chest: No acute findings. Hepatobiliary: No hepatic masses identified. Severe diffuse hepatic steatosis is new since previous study. Gallbladder is unremarkable. No evidence of biliary ductal dilatation, with common bile duct measuring 6 mm in diameter. No evidence of choledocholithiasis. Pancreas: Diffuse pancreatic swelling and peripancreatic edema is seen, consistent with acute pancreatitis. Multiple new complex cystic lesions are seen in the pancreatic head and tail, and in the adjacent gastrosplenic ligament. Largest collection in the area the pancreatic tail measures 5.5 x 5.1 cm on image 48/14. These are most consistent with pancreatic necrosis and pseudocysts. No evidence of pancreatic ductal dilatation. 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 Spleen:  Within normal limits in  size and appearance. Adrenals/Urinary Tract: No masses identified. No evidence of hydronephrosis. Stomach/Bowel: Visualized portion unremarkable. Vascular/Lymphatic: No pathologically enlarged lymph nodes identified. No abdominal aortic aneurysm. Other:  None. Musculoskeletal:  No suspicious bone lesions identified. IMPRESSION: Acute pancreatitis, with areas of pancreatic necrosis and new pseudocysts in the pancreatic head and tail. 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 New severe diffuse hepatic steatosis. No evidence of hepatic neoplasm. No evidence of biliary ductal dilatation or choledocholithiasis. These results will be called to the ordering clinician or representative by the Radiologist Assistant, and communication documented in the PACS or zVision Dashboard. Electronically Signed   By: Earle Gell M.D.   On: 08/03/2017 14:29    Procedures Procedures (including critical care time)  Medications Ordered in ED Medications  naphazoline-glycerin (CLEAR EYES REDNESS) ophth solution 1 drop (has no administration in time range)  enoxaparin (LOVENOX) injection 40 mg (has no administration in time range)  famotidine (PEPCID) IVPB 20 mg premix (has no administration in time range)  fentaNYL (SUBLIMAZE) injection 25-50 mcg (has no administration in time range)  0.9 %  sodium chloride infusion (has no administration in time range)  acetaminophen (TYLENOL) tablet 650 mg (has no administration in time range)    Or  acetaminophen (TYLENOL) suppository 650 mg (has no administration in time range)  ondansetron (ZOFRAN) tablet 4 mg (has no administration in time range)    Or  ondansetron (ZOFRAN) injection 4 mg (has no administration in time range)  promethazine (PHENERGAN) injection 12.5 mg (has no administration in time range)  LORazepam (ATIVAN) injection 2 mg (2 mg Intravenous Given 08/04/17 2156)     Initial Impression / Assessment and Plan / ED Course  I have reviewed the triage  vital signs and the nursing notes.  Pertinent labs & imaging results that were available during my care of the patient were reviewed by me and considered in my medical decision making (see chart for details).    Patient is a 57 year old male with history as above who presents with 2 weeks of epigastric abdominal pain.  He has been being worked up as an outpatient.  He had an ultrasound on 523 which revealed a couple of pancreatic cystic masses.  He was then sent for MRCP which occurred yesterday.  The MRCP was notable for acute necrotizing pancreatitis.  His GI physician called him back and requested that he be admitted.  Patient reports that his pain is  intermittent.  He is able to tolerate some foods, however he continues to have intermittent nausea and vomiting for the last 3 days.  He does have chronic alcohol use and drinks 1-2 bottles of wine daily.  His last drink was 1 hour ago.  He has had previous admission for severe alcohol withdrawal.  Here, he is overall well-appearing.  He is tachycardic.  He was given a dose of Ativan.  Patient admitted to hospitalist for further work-up and treatment.  Final Clinical Impressions(s) / ED Diagnoses   Final diagnoses:  Epigastric pain  Acute necrotizing pancreatitis    ED Discharge Orders    None       Clifton James, MD 08/04/17 1610    Virgel Manifold, MD 08/06/17 2137

## 2017-08-04 NOTE — H&P (Signed)
History and Physical    Leonard [name] TIW:[id number] DOB: 11-28-1960 DOA: 08/04/2017  PCP: Gaynelle Arabian, MD   Patient coming from: Home   Chief Complaint: Epigastric pain, nausea   HPI: Leonard [name] is a 57 y.o. male with medical history significant for depression, chronic neck pain status post fusion, history of left carotid dissection managed conservatively, and alcohol dependence, now presenting to the emergency department for evaluation of severe epigastric pain, nausea, and abnormal outpatient imaging.  Patient was reportedly evaluated in the outpatient clinic by gastroenterology for his severe abdominal pain and nausea.  He underwent MRCP concerning for acute pancreatitis with necrosis and pseudocyst and was directed to the ED.  Patient reports that his abdominal pain has been persistent for the past 2 weeks, associated with severe nausea, but only rare episodes of vomiting.  Denies jaundice or pruritus.  Reports excessive daily alcohol use in an attempt  To alleviate his neck pain.  Denies fevers or chills.  ED Course: Upon arrival to the ED, patient is found to be afebrile, saturating well on room air, tachycardic in the 120s, and with stable blood pressure.  EKG features a sinus rhythm with left axis deviation.  Chemistry panel is notable for an alkaline phosphatase of 236, AST 193, ALT 138, and normal bilirubin.  CBC is unremarkable.  There was concern for alcohol withdrawal in the ED and the patient was treated with 2 mg of IV Ativan.  He remains hemodynamically stable and will be admitted to the medical-surgical unit for ongoing evaluation and management of alcoholic pancreatitis.  Review of Systems:  All other systems reviewed and apart from HPI, are negative.  Past Medical History:  Diagnosis Date  . Abnormal EKG    anterolateral T wave inversion  . Anxiety   . Carotid artery dissection ([place])   . Chronic back pain    spondylolisthesis,spondylosis,DDD,stenosis  .  Depression   . Dizziness   . GERD (gastroesophageal reflux disease)    occasionally and takes Pepcid if needed  . Headache(784.0)    minor and occasionally  . Hyperlipidemia    was on meds but has been off x 5 months d/t elevated liver enzymes  . Hyperlipidemia    statin intolerant because of elevated liver function tests  . Insomnia    takes Trazodone nightly  . Numbness and tingling of both legs   . Opiate addiction ([place])   . PONV (postoperative nausea and vomiting)   . Weakness    pain and occasionally tingling in right arm and leg    Past Surgical History:  Procedure Laterality Date  . APPENDECTOMY  as a child  . BACK SURGERY  2014, 2015  . CERVICAL FUSION  2003, 2007   x 2  . COLONOSCOPY    . KNEE SURGERY Right 2011  . LEG SURGERY Right as a child   compound fracture  . radial nerve impingement Right 2009     reports that he has quit smoking. His smoking use included cigarettes. He has a 1.00 pack-year smoking history. He has never used smokeless tobacco. He reports that he drinks alcohol. He reports that he does not use drugs.  Allergies  Allergen Reactions  . Sulfa Antibiotics Swelling and Rash    Face swells and causes welts  . Zocor [Simvastatin] Other (See Comments)    Elevated his liver enzymes    Family History  Problem Relation Age of Onset  . Arthritis Mother   . Cirrhosis Father   .  Stroke Neg Hx      Prior to Admission medications   Medication Sig Start Date End Date Taking? Authorizing Provider  aspirin 81 MG chewable tablet Chew 81 mg by mouth daily.    Yes [provider]  Cholecalciferol (VITAMIN D3) 2000 units capsule Take 2,000 Units by mouth daily.    Yes [provider]  Diclofenac Sodium 3 % GEL Apply topically See admin instructions. Apply topically daily as directed to painful sites 07/21/17  Yes [provider]  ibuprofen (ADVIL,MOTRIN) 200 MG tablet Take 600 mg by mouth 2 (two) times daily as needed (for  pain).    Yes [provider]  naphazoline (NAPHCON) 0.1 % ophthalmic solution Place 1 drop into both eyes 4 (four) times daily as needed for eye irritation or allergies.    Yes [provider]  sertraline (ZOLOFT) 50 MG tablet Take 75 mg by mouth 2 (two) times daily.    Yes [provider]    Physical Exam: Vitals:   08/04/17 1937 08/04/17 1938  BP: 119/78   Pulse: (!) 116   Resp: 18   Temp: 98 F (36.7 C)   TempSrc: Oral   SpO2: 97%   Weight:  88.5 kg (195 lb)  Height:  5\' 9"  (1.753 m)      Constitutional: NAD, appears uncomfortable  Eyes: PERTLA, lids and conjunctivae normal ENMT: Mucous membranes are moist. Posterior pharynx clear of any exudate or lesions.   Neck: normal, supple, no masses, no thyromegaly Respiratory: clear to auscultation bilaterally, no wheezing, no crackles. Normal respiratory effort.   Cardiovascular: Rate ~120 and regular. No extremity edema. No significant JVD. Abdomen: No distension, soft, tender in epigastrium, no rebound pain or guarding. Bowel sounds active.  Musculoskeletal: no clubbing / cyanosis. No joint deformity upper and lower extremities.   Skin: no significant rashes, lesions, ulcers. Warm, dry, well-perfused. Neurologic: CN 2-12 grossly intact. Sensation intact. Strength 5/5 in all 4 limbs.  Psychiatric: Alert and oriented to person, place, and situation. Calm, cooperative.     Labs on Admission: I have personally reviewed following labs and imaging studies  CBC: Recent Labs  Lab 08/04/17 1946  WBC 4.7  HGB 13.2  HCT 38.9*  MCV 97.0  PLT 378   Basic Metabolic Panel: Recent Labs  Lab 08/04/17 1946  NA 140  K 4.2  CL 103  CO2 25  GLUCOSE 163*  BUN 7  CREATININE 0.75  CALCIUM 9.3   GFR: Estimated Creatinine Clearance: 110.8 mL/min (by C-G formula based on SCr of 0.75 mg/dL). Liver Function Tests: Recent Labs  Lab 08/04/17 1946  AST 193*  ALT 138*  ALKPHOS 236*  BILITOT 1.0  PROT 7.4   ALBUMIN 3.7   Recent Labs  Lab 08/04/17 1946  LIPASE 49   No results for input(s): AMMONIA in the last 168 hours. Coagulation Profile: No results for input(s): INR, PROTIME in the last 168 hours. Cardiac Enzymes: No results for input(s): CKTOTAL, CKMB, CKMBINDEX, TROPONINI in the last 168 hours. BNP (last 3 results) No results for input(s): PROBNP in the last 8760 hours. HbA1C: No results for input(s): HGBA1C in the last 72 hours. CBG: No results for input(s): GLUCAP in the last 168 hours. Lipid Profile: No results for input(s): CHOL, HDL, LDLCALC, TRIG, CHOLHDL, LDLDIRECT in the last 72 hours. Thyroid Function Tests: No results for input(s): TSH, T4TOTAL, FREET4, T3FREE, THYROIDAB in the last 72 hours. Anemia Panel: No results for input(s): VITAMINB12, FOLATE, FERRITIN, TIBC, IRON, RETICCTPCT  in the last 72 hours. Urine analysis:    Component Value Date/Time   COLORURINE YELLOW 08/04/2017 1943   APPEARANCEUR CLEAR 08/04/2017 1943   LABSPEC 1.014 08/04/2017 1943   PHURINE 6.0 08/04/2017 1943   GLUCOSEU 150 (A) 08/04/2017 [place] 08/04/2017 [place] 08/04/2017 1943   KETONESUR 5 (A) 08/04/2017 1943   PROTEINUR NEGATIVE 08/04/2017 1943   NITRITE NEGATIVE 08/04/2017 1943   LEUKOCYTESUR NEGATIVE 08/04/2017 1943   Sepsis Labs: @LABRCNTIP (procalcitonin:4,lacticidven:4) )No results found for this or any previous visit (from the past 240 hour(s)).   Radiological Exams on Admission: Mr Abdomen Mrcp W Wo Contast  Result Date: 08/03/2017 CLINICAL DATA:  Abdominal pain with nausea and vomiting for 1 month. Possible pancreatic mass on recent ultrasound. EXAM: MRI ABDOMEN WITHOUT AND WITH CONTRAST (INCLUDING MRCP) TECHNIQUE: Multiplanar multisequence MR imaging of the abdomen was performed both before and after the administration of intravenous contrast. Heavily T2-weighted images of the biliary and pancreatic ducts were obtained, and  three-dimensional MRCP images were rendered by post processing. CONTRAST:  55mL MULTIHANCE GADOBENATE DIMEGLUMINE 529 MG/ML IV SOLN COMPARISON:  08/26/2016 and 08/01/2015 FINDINGS: Lower chest: No acute findings. Hepatobiliary: No hepatic masses identified. Severe diffuse hepatic steatosis is new since previous study. Gallbladder is unremarkable. No evidence of biliary ductal dilatation, with common bile duct measuring 6 mm in diameter. No evidence of choledocholithiasis. Pancreas: Diffuse pancreatic swelling and peripancreatic edema is seen, consistent with acute pancreatitis. Multiple new complex cystic lesions are seen in the pancreatic head and tail, and in the adjacent gastrosplenic ligament. Largest collection in the area the pancreatic tail measures 5.5 x 5.1 cm on image 48/14. These are most consistent with pancreatic necrosis and pseudocysts. No evidence of pancreatic ductal dilatation. 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 Spleen:  Within normal limits in size and appearance. Adrenals/Urinary Tract: No masses identified. No evidence of hydronephrosis. Stomach/Bowel: Visualized portion unremarkable. Vascular/Lymphatic: No pathologically enlarged lymph nodes identified. No abdominal aortic aneurysm. Other:  None. Musculoskeletal:  No suspicious bone lesions identified. IMPRESSION: Acute pancreatitis, with areas of pancreatic necrosis and new pseudocysts in the pancreatic head and tail. 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 New severe diffuse hepatic steatosis. No evidence of hepatic neoplasm. No evidence of biliary ductal dilatation or choledocholithiasis. These results will be called to the ordering clinician or representative by the Radiologist Assistant, and communication documented in the PACS or zVision Dashboard. Electronically Signed   By: Earle Gell M.D.   On: 08/03/2017 14:29    EKG: Independently reviewed. Sinus rhythm, LAD.   Assessment/Plan  1. Acute  pancreatitis  - Presents with severe epigastric pain and nausea  - Outpatient MRCP consistent with acute pancreatitis with areas of necrosis new pseudocyst  - No ductal dilatation or choledocholithiasis on MRCP  - Likely secondary to alcohol  - Fluid-resuscitate with NS, keep NPO, monitor lytes, culture if febrile, start H2-blocker and prn antiemetics    2. Alcohol dependence   - Reports hx of alcoholism with ~5 yrs abstinence until he developed worsening neck pain; reports that recent excessive daily drinking an attempt to alleviate his neck pain  - There was concern for withdrawal in ED and he was treated with 2 mg IV Ativan  - Continue CIWA with prn Ativan  - Supplement vitamins and minerals    3. Chronic neck pain   - Reports hx of cervical fusions with new pain and radiculopathy on left  - Following with  NSG and reports upcoming surgery to address this  - Stable, no LUE weakness    4. Depression  - Resume Zoloft once appropriate for diet     DVT prophylaxis: Lovenox  Code Status: Full  Family Communication: Wife updated at bedside Consults called: none Admission status: Inpatient    Vianne Bulls, MD [facility] Pager [phone]  If 7PM-7AM, please contact night-coverage [email or web address] Password [email or web address]  08/04/2017, 11:13 PM

## 2017-08-05 ENCOUNTER — Encounter (HOSPITAL_COMMUNITY): Payer: Self-pay | Admitting: General Practice

## 2017-08-05 ENCOUNTER — Other Ambulatory Visit: Payer: Self-pay

## 2017-08-05 LAB — CBC WITH DIFFERENTIAL/PLATELET
Abs Immature Granulocytes: 0 10*3/uL (ref 0.0–0.1)
Basophils Absolute: 0 10*3/uL (ref 0.0–0.1)
Basophils Relative: 1 %
Eosinophils Absolute: 0.2 10*3/uL (ref 0.0–0.7)
Eosinophils Relative: 4 %
HCT: 36.1 % — ABNORMAL LOW (ref 39.0–52.0)
Hemoglobin: 12.1 g/dL — ABNORMAL LOW (ref 13.0–17.0)
Immature Granulocytes: 1 %
Lymphocytes Relative: 19 %
Lymphs Abs: 0.7 10*3/uL (ref 0.7–4.0)
MCH: 33 pg (ref 26.0–34.0)
MCHC: 33.5 g/dL (ref 30.0–36.0)
MCV: 98.4 fL (ref 78.0–100.0)
Monocytes Absolute: 0.4 10*3/uL (ref 0.1–1.0)
Monocytes Relative: 11 %
Neutro Abs: 2.6 10*3/uL (ref 1.7–7.7)
Neutrophils Relative %: 64 %
Platelets: 145 10*3/uL — ABNORMAL LOW (ref 150–400)
RBC: 3.67 MIL/uL — ABNORMAL LOW (ref 4.22–5.81)
RDW: 13.8 % (ref 11.5–15.5)
WBC: 3.9 10*3/uL — ABNORMAL LOW (ref 4.0–10.5)

## 2017-08-05 LAB — COMPREHENSIVE METABOLIC PANEL
ALT: 119 U/L — ABNORMAL HIGH (ref 17–63)
AST: 171 U/L — ABNORMAL HIGH (ref 15–41)
Albumin: 3.4 g/dL — ABNORMAL LOW (ref 3.5–5.0)
Alkaline Phosphatase: 195 U/L — ABNORMAL HIGH (ref 38–126)
Anion gap: 9 (ref 5–15)
BUN: 7 mg/dL (ref 6–20)
CO2: 24 mmol/L (ref 22–32)
Calcium: 8.4 mg/dL — ABNORMAL LOW (ref 8.9–10.3)
Chloride: 107 mmol/L (ref 101–111)
Creatinine, Ser: 0.83 mg/dL (ref 0.61–1.24)
GFR calc Af Amer: >60 mL/min (ref >60)
GFR calc non Af Amer: >60 mL/min (ref >60)
Glucose, Bld: 106 mg/dL — ABNORMAL HIGH (ref 65–99)
Potassium: 4.4 mmol/L (ref 3.5–5.1)
Sodium: 140 mmol/L (ref 135–145)
Total Bilirubin: 0.9 mg/dL (ref 0.3–1.2)
Total Protein: 6.3 g/dL — ABNORMAL LOW (ref 6.5–8.1)

## 2017-08-05 LAB — LIPASE, BLOOD: Lipase: 44 U/L (ref 11–51)

## 2017-08-05 LAB — GLUCOSE, CAPILLARY: Glucose-Capillary: 117 mg/dL — ABNORMAL HIGH (ref 65–99)

## 2017-08-05 LAB — HIV ANTIBODY (ROUTINE TESTING W REFLEX): HIV Screen 4th Generation wRfx: NONREACTIVE

## 2017-08-05 LAB — AMYLASE: Amylase: 43 U/L (ref 28–100)

## 2017-08-05 MED ORDER — PNEUMOCOCCAL VAC POLYVALENT 25 MCG/0.5ML IJ INJ
0.5000 mL | INJECTION | INTRAMUSCULAR | Status: AC
  Administered 2017-08-06: 0.5 mL via INTRAMUSCULAR
  Filled 2017-08-05: qty 0.5

## 2017-08-05 NOTE — Consult Note (Signed)
[facility] Consultation Note  Referring Provider: Dr. Roger Shelter Primary Care Physician:  Gaynelle Arabian, MD  Reason for Consultation:  pancreatitis  HPI: Leonard [name] is a 57 y.o. male whom I've been asked to see for pancreatitis.  Patient with several month history of progressively worsening neck pain, bulging disks, set to have surgery in a couple months.  He has had minimal improvement with variety of analgesics, so resorted to drinking more alcohol, about 2 bottles of wine per day, for the past several weeks.  Had epigastric pain and nausea and vomiting few weeks ago; pain has now resolved but he has ongoing nausea and vomiting.  Losing weight.  No diarrhea or steatorrhea or blood in stool.  MRI recently with pancreatitis and acute peripancreatic fluid collections and some regions of necrosis.  He has no abdominal pain.  Nausea, and neck pain, are his main complaints.   Past Medical History:  Diagnosis Date  . Abnormal EKG    anterolateral T wave inversion  . Anxiety   . Carotid artery dissection ([place])   . Chronic back pain    spondylolisthesis,spondylosis,DDD,stenosis  . Depression   . Dizziness   . GERD (gastroesophageal reflux disease)    occasionally and takes Pepcid if needed  . Headache(784.0)    minor and occasionally  . Hyperlipidemia    was on meds but has been off x 5 months d/t elevated liver enzymes  . Hyperlipidemia    statin intolerant because of elevated liver function tests  . Insomnia    takes Trazodone nightly  . Numbness and tingling of both legs   . Opiate addiction ([place])   . PONV (postoperative nausea and vomiting)   . Weakness    pain and occasionally tingling in right arm and leg    Past Surgical History:  Procedure Laterality Date  . APPENDECTOMY  as a child  . BACK SURGERY  2014, 2015  . CERVICAL FUSION  2003, 2007   x 2  . COLONOSCOPY    . KNEE SURGERY Right 2011  . LEG SURGERY Right as a child   compound fracture  . radial  nerve impingement Right 2009    Prior to Admission medications   Medication Sig Start Date End Date Taking? Authorizing Provider  aspirin 81 MG chewable tablet Chew 81 mg by mouth daily.    Yes [provider]  Cholecalciferol (VITAMIN D3) 2000 units capsule Take 2,000 Units by mouth daily.    Yes [provider]  Diclofenac Sodium 3 % GEL Apply topically See admin instructions. Apply topically daily as directed to painful sites 07/21/17  Yes [provider]  ibuprofen (ADVIL,MOTRIN) 200 MG tablet Take 600 mg by mouth 2 (two) times daily as needed (for pain).    Yes [provider]  naphazoline (NAPHCON) 0.1 % ophthalmic solution Place 1 drop into both eyes 4 (four) times daily as needed for eye irritation or allergies.    Yes [provider]  sertraline (ZOLOFT) 50 MG tablet Take 75 mg by mouth 2 (two) times daily.    Yes [provider]    Current Facility-Administered Medications  Medication Dose Route Frequency Provider Last Rate Last Dose  . acetaminophen (TYLENOL) tablet 650 mg  650 mg Oral Q6H PRN Opyd, Ilene Qua, MD       Or  . acetaminophen (TYLENOL) suppository 650 mg  650 mg Rectal Q6H PRN Opyd, Ilene Qua, MD      . enoxaparin (LOVENOX) injection 40 mg  40 mg Subcutaneous Q24H Opyd, Ilene Qua, MD   40 mg at 08/05/17 0958  . famotidine (PEPCID) IVPB 20 mg premix  20 mg Intravenous Q12H Opyd, Ilene Qua, MD   Stopped at 08/05/17 1028  . fentaNYL (SUBLIMAZE) injection 25-50 mcg  25-50 mcg Intravenous Q2H PRN Opyd, Ilene Qua, MD      . folic acid (FOLVITE) tablet 1 mg  1 mg Oral Daily Opyd, Ilene Qua, MD   1 mg at 08/05/17 0958  . LORazepam (ATIVAN) injection 0-4 mg  0-4 mg Intravenous Q6H Opyd, Ilene Qua, MD   4 mg at 08/05/17 1139   Followed by  . [START ON 08/07/2017] LORazepam (ATIVAN) injection 0-4 mg  0-4 mg Intravenous Q12H Opyd, Timothy S, MD      . LORazepam (ATIVAN) tablet 1 mg  1 mg Oral Q6H PRN Opyd, Ilene Qua, MD       Or   . LORazepam (ATIVAN) injection 1 mg  1 mg Intravenous Q6H PRN Opyd, Ilene Qua, MD   1 mg at 08/05/17 1610  . multivitamin with minerals tablet 1 tablet  1 tablet Oral Daily Opyd, Ilene Qua, MD   1 tablet at 08/05/17 9604  . naphazoline-glycerin (CLEAR EYES REDNESS) ophth solution 1 drop  1 drop Both Eyes QID PRN Opyd, Ilene Qua, MD      . ondansetron (ZOFRAN) tablet 4 mg  4 mg Oral Q6H PRN Opyd, Ilene Qua, MD       Or  . ondansetron (ZOFRAN) injection 4 mg  4 mg Intravenous Q6H PRN Opyd, Ilene Qua, MD      . promethazine (PHENERGAN) injection 12.5 mg  12.5 mg Intravenous Q6H PRN Opyd, Ilene Qua, MD      . thiamine (VITAMIN B-1) tablet 100 mg  100 mg Oral Daily Opyd, Ilene Qua, MD   100 mg at 08/05/17 5409   Or  . thiamine (B-1) injection 100 mg  100 mg Intravenous Daily Opyd, Ilene Qua, MD        Allergies as of 08/04/2017 - Review Complete 08/04/2017  Allergen Reaction Noted  . Sulfa antibiotics Swelling and Rash 08/10/2011  . Zocor [simvastatin] Other (See Comments) 08/04/2017    Family History  Problem Relation Age of Onset  . Arthritis Mother   . Cirrhosis Father   . Stroke Neg Hx     Social History   Socioeconomic History  . Marital status: Married    Spouse name: Not on file  . Number of children: 0  . Years of education: 67  . Highest education level: Not on file  Occupational History  . Occupation: [employer]  Social Needs  . Financial resource strain: Not on file  . Food insecurity:    Worry: Not on file    Inability: Not on file  . Transportation needs:    Medical: Not on file    Non-medical: Not on file  Tobacco Use  . Smoking status: Former Smoker    Packs/day: 0.50    Years: 2.00    Pack years: 1.00    Types: Cigarettes  . Smokeless tobacco: Never Used  . Tobacco comment: smoked while he was in high school  Substance and Sexual Activity  . Alcohol use: Yes    Comment: 2 glasses red wine nightly  . Drug use: No  . Sexual activity:  Yes  Lifestyle  . Physical activity:    Days per week: Not on file    Minutes per session: Not  on file  . Stress: Not on file  Relationships  . Social connections:    Talks on phone: Not on file    Gets together: Not on file    Attends religious service: Not on file    Active member of club or organization: Not on file    Attends meetings of clubs or organizations: Not on file    Relationship status: Not on file  . Intimate partner violence:    Fear of current or ex partner: Not on file    Emotionally abused: Not on file    Physically abused: Not on file    Forced sexual activity: Not on file  Other Topics Concern  . Not on file  Social History Narrative   ** Merged History Encounter **   Lives at home w/ his wife   Right-handed   Caffeine: 3 cups coffee per day    Review of Systems: As per HPI, all others negative  Physical Exam: Vital signs in last 24 hours: Temp:  [98 F (36.7 C)-98.1 F (36.7 C)] 98 F (36.7 C) (05/31 1226) Pulse Rate:  [91-116] 96 (05/31 1226) Resp:  [15-21] 16 (05/31 1226) BP: (119-141)/(78-103) 134/103 (05/31 1226) SpO2:  [93 %-99 %] 99 % (05/31 1226) Weight:  [88.5 kg (195 lb)] 88.5 kg (195 lb) (05/30 1938)   General:   Alert,  Well-developed, well-nourished, pleasant and cooperative in NAD Head:  Normocephalic and atraumatic. Eyes:  Sclera clear, no icterus.   Conjunctiva pink. Ears:  Normal auditory acuity. Nose:  No deformity, discharge,  or lesions. Mouth:  No deformity or lesions.  Oropharynx pink & moist. Neck:  Pain, limited range-of-motion; no masses or thyromegaly. Lungs:  Clear throughout to auscultation.   No wheezes, crackles, or rhonchi. No acute distress. Heart:  Regular rate and rhythm; no murmurs, clicks, rubs,  or gallops. Abdomen:  Soft, nontender and nondistended. No masses, hepatosplenomegaly or hernias noted. Normal bowel sounds, without guarding, and without rebound.     Msk:  Symmetrical without gross deformities.  Normal posture. Pulses:  Normal pulses noted. Extremities:  Without clubbing or edema. Neurologic:  Alert and  oriented x4;  grossly normal neurologically. Skin:  Intact without significant lesions or rashes. Psych:  Alert and cooperative. Normal mood and affect.   Lab Results: Recent Labs    08/04/17 1946 08/05/17 0410  WBC 4.7 3.9*  HGB 13.2 12.1*  HCT 38.9* 36.1*  PLT 196 145*   BMET Recent Labs    08/04/17 1946 08/05/17 0410  NA 140 140  K 4.2 4.4  CL 103 107  CO2 25 24  GLUCOSE 163* 106*  BUN 7 7  CREATININE 0.75 0.83  CALCIUM 9.3 8.4*   LFT Recent Labs    08/05/17 0410  PROT 6.3*  ALBUMIN 3.4*  AST 171*  ALT 119*  ALKPHOS 195*  BILITOT 0.9   PT/INR No results for input(s): LABPROT, INR in the last 72 hours.  Studies/Results: No results found.  Impression:  1.  Acute pancreatitis.  Alcohol mediated. 2.  Abnormal MRI/MRCP;  Pancreatitis and cystic collections and some necrosis seen; no gallstones or choledocholithiasis.  Significant hepatic steatosis. 3.  Nausea +/- vomiting.  No abdominal pain. 4.  Hepatic steatosis.  Likely alcohol-mediated. 5.  Elevated LFTs, likely alcohol=mediated. 6.  Alcohol abuse.  Plan:  1.  PPI. 2.  Empiric antiemetics. 3.  Small, frequent meals; low fat, low lactose, low roughage. 4.  Acute hepatitis panel. 5.  Supportive management of pancreatitis,  although despite the findings on MRI he is only minimally symptomatic. 6.  Repeat CT/MRI in 4-6 weeks to assess for evolution of pancreatitis/necrosis.  Patient not candidate for any pancreatic cyst drainage procedures, should they be required, for at least the next several weeks. 7.  Social Financial controller consultations to assess alcohol rehab options (inpatient versus outpatient). 8.  Watch for signs of alcohol withdrawal over the next couple days. 9.  [facility] will follow.   LOS: 1 day   Jazmyne Beauchesne M  08/05/2017, 2:12 PM  Cell [phone] If no answer or  after 5 PM call [phone]

## 2017-08-05 NOTE — Progress Notes (Signed)
PROGRESS NOTE    Patient: Leonard [name]     PCP: Gaynelle Arabian, MD                    DOB: 11-07-1960            DOA: 08/04/2017 KDT:[id number]             DOS: 08/05/2017, 1:09 PM   LOS: 1 day   Date of Service: The patient was seen and examined on 08/05/2017  Subjective:   Patient was seen and examined this morning, stable no acute distress.  Upon questioning he admits of excessively taken alcohol.  On chronic neck pain. He is aware of his current condition of pancreatitis with pseudocyst. Currently comfortable.  No issues overnight.  ----------------------------------------------------------------------------------------------------------------------  Brief Narrative:   Leonard [name] is a 57 y.o. male with medical history significant for depression, chronic neck pain status post fusion, history of left carotid dissection managed conservatively, and alcohol dependence, now presenting to the emergency department for evaluation of severe epigastric pain, nausea, and abnormal outpatient imaging.  Patient was reportedly evaluated in the outpatient clinic by gastroenterology for his severe abdominal pain and nausea.  He underwent MRCP concerning for acute pancreatitis with necrosis and pseudocyst and was directed to the ED.  Patient reports that his abdominal pain has been persistent for the past 2 weeks, associated with severe nausea, but only rare episodes of vomiting.  Denies jaundice or pruritus.  Reports excessive daily alcohol use in an attempt  To alleviate his neck pain.    Principal Problem:   Acute pancreatitis Active Problems:   Alcohol dependence ([place])   Fatty infiltration of liver   Anxiety and depression   Elevated LFTs   Carotid artery dissection ([facility])   Chronic neck pain   Acute alcoholic pancreatitis   Assessment & Plan:   1. Acute pancreatitis  - Improved epigastric pain and nausea vomiting with current medication - Outpatient MRCP consistent with acute  pancreatitis with areas of necrosis new pseudocyst  - No ductal dilatation or choledocholithiasis on MRCP  - Likely secondary to alcohol  -We will continue pain management, IV fluids, n.p.o., will monitor electrolytes, PRN antiemetics, -Continue IV Pepcid  2. Alcohol dependence   - Reports hx of alcoholism with ~5 yrs abstinence until he developed worsening neck pain; reports that recent excessive daily drinking an attempt to alleviate his neck pain  -[place] stable, will continue thiamine, folate, CIWA protocol along with a PRN Ativan, - Supplement vitamins and minerals    3. Chronic neck pain   - Reports hx of cervical fusions with new pain and radiculopathy on left  - Following with NSG and reports upcoming surgery to address this  - Stable, no LUE weakness    4. Depression  - Resume Zoloft once appropriate for diet     DVT prophylaxis: Lovenox  Code Status: Full  Family Communication: Wife updated at bedside Consults called: none Admission status: Inpatient     Consultants:   GI notified   Procedures:    Recent outpatient MRCP  Antimicrobials:  Anti-infectives (From admission, onward)   None     Objective: Vitals:   08/05/17 0600 08/05/17 0602 08/05/17 0648 08/05/17 1226  BP: (!) 137/92 (!) 137/92 138/81 (!) 134/103  Pulse: (!) 103 (!) 107 (!) 105 96  Resp: 19  17 16   Temp:   98.1 F (36.7 C) 98 F (36.7 C)  TempSrc:   Oral Oral  SpO2: 95%  95% 99%  Weight:      Height:        Intake/Output Summary (Last 24 hours) at 08/05/2017 1309 Last data filed at 08/05/2017 0640 Gross per 24 hour  Intake 1050 ml  Output -  Net 1050 ml   Filed Weights   08/04/17 1938  Weight: 88.5 kg (195 lb)    Examination:  General exam: Appears calm and comfortable  Psychiatry: Judgement and insight appear normal. Mood & affect appropriate. HEENT: WNLs Respiratory system: Clear to auscultation. Respiratory effort normal. Cardiovascular system: S1 & S2 heard, RRR.  No JVD, murmurs, rubs, gallops or clicks. No pedal edema. Gastrointestinal system: Epigastric tenderness with deep palpation, no rebound tenderness, negative any fluid shifts, negative any organomegalies, Central nervous system: Alert and oriented. No focal neurological deficits. Extremities: Symmetric 5 x 5 power. Skin: No rashes, lesions or ulcers Wounds:   Data Reviewed: I have personally reviewed following labs and imaging studies  CBC: Recent Labs  Lab 08/04/17 1946 08/05/17 0410  WBC 4.7 3.9*  NEUTROABS  --  2.6  HGB 13.2 12.1*  HCT 38.9* 36.1*  MCV 97.0 98.4  PLT 196 144*   Basic Metabolic Panel: Recent Labs  Lab 08/04/17 1946 08/05/17 0410  NA 140 140  K 4.2 4.4  CL 103 107  CO2 25 24  GLUCOSE 163* 106*  BUN 7 7  CREATININE 0.75 0.83  CALCIUM 9.3 8.4*   GFR: Estimated Creatinine Clearance: 106.8 mL/min (by C-G formula based on SCr of 0.83 mg/dL). Liver Function Tests: Recent Labs  Lab 08/04/17 1946 08/05/17 0410  AST 193* 171*  ALT 138* 119*  ALKPHOS 236* 195*  BILITOT 1.0 0.9  PROT 7.4 6.3*  ALBUMIN 3.7 3.4*   Recent Labs  Lab 08/04/17 1946 08/05/17 0410  LIPASE 49 44  AMYLASE  --  43  CBG: Recent Labs  Lab 08/05/17 0738  GLUCAP 117*      Radiology Studies: Mr Abdomen Mrcp W Wo Contast  Result Date: 08/03/2017 CLINICAL DATA:  Abdominal pain with nausea and vomiting for 1 month. Possible pancreatic mass on recent ultrasound. EXAM: MRI ABDOMEN WITHOUT AND WITH CONTRAST (INCLUDING MRCP) TECHNIQUE: Multiplanar multisequence MR imaging of the abdomen was performed both before and after the administration of intravenous contrast. Heavily T2-weighted images of the biliary and pancreatic ducts were obtained, and three-dimensional MRCP images were rendered by post processing. CONTRAST:  38mL MULTIHANCE GADOBENATE DIMEGLUMINE 529 MG/ML IV SOLN COMPARISON:  08/26/2016 and 08/01/2015 FINDINGS: Lower chest: No acute findings. Hepatobiliary: No hepatic  masses identified. Severe diffuse hepatic steatosis is new since previous study. Gallbladder is unremarkable. No evidence of biliary ductal dilatation, with common bile duct measuring 6 mm in diameter. No evidence of choledocholithiasis. Pancreas: Diffuse pancreatic swelling and peripancreatic edema is seen, consistent with acute pancreatitis. Multiple new complex cystic lesions are seen in the pancreatic head and tail, and in the adjacent gastrosplenic ligament. Largest collection in the area the pancreatic tail measures 5.5 x 5.1 cm on image 48/14. These are most consistent with pancreatic necrosis and pseudocysts. No evidence of pancreatic ductal dilatation. 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 Spleen:  Within normal limits in size and appearance. Adrenals/Urinary Tract: No masses identified. No evidence of hydronephrosis. Stomach/Bowel: Visualized portion unremarkable. Vascular/Lymphatic: No pathologically enlarged lymph nodes identified. No abdominal aortic aneurysm. Other:  None. Musculoskeletal:  No suspicious bone lesions identified. IMPRESSION: Acute pancreatitis, with areas of pancreatic necrosis and new pseudocysts in the pancreatic head and tail. 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 New severe diffuse hepatic steatosis. No evidence of hepatic neoplasm. No evidence of biliary ductal dilatation or choledocholithiasis. These results will be called to the ordering clinician or representative by the Radiologist Assistant, and communication documented in the PACS or zVision Dashboard. Electronically Signed   By: Earle Gell M.D.   On: 08/03/2017 14:29    Scheduled Meds: . enoxaparin (LOVENOX) injection  40 mg Subcutaneous Q24H  . folic acid  1 mg Oral Daily  . LORazepam  0-4 mg Intravenous Q6H   Followed by  . [START ON 08/07/2017] LORazepam  0-4 mg Intravenous Q12H  . multivitamin with minerals  1 tablet Oral Daily  . thiamine  100 mg Oral Daily   Or  . thiamine   100 mg Intravenous Daily   Continuous Infusions: . famotidine (PEPCID) IV Stopped (08/05/17 1028)    Time spent: >25 minutes  Deatra James, MD [facility],  Pager [phone]  If 7PM-7AM, please contact night-coverage [email or web address]   Password [email or web address]  08/05/2017, 1:09 PM

## 2017-08-05 NOTE — Progress Notes (Signed)
Received pt from ED.Dx: Acute Pancreatitis. Pt alert and oriented x4. Wife at bedside. Oriented to room and call bell.

## 2017-08-06 LAB — COMPREHENSIVE METABOLIC PANEL
ALT: 94 U/L — ABNORMAL HIGH (ref 17–63)
AST: 125 U/L — ABNORMAL HIGH (ref 15–41)
Albumin: 3.3 g/dL — ABNORMAL LOW (ref 3.5–5.0)
Alkaline Phosphatase: 174 U/L — ABNORMAL HIGH (ref 38–126)
Anion gap: 9 (ref 5–15)
BUN: 11 mg/dL (ref 6–20)
CO2: 25 mmol/L (ref 22–32)
Calcium: 8.7 mg/dL — ABNORMAL LOW (ref 8.9–10.3)
Chloride: 107 mmol/L (ref 101–111)
Creatinine, Ser: 0.9 mg/dL (ref 0.61–1.24)
GFR calc Af Amer: >60 mL/min (ref >60)
GFR calc non Af Amer: >60 mL/min (ref >60)
Glucose, Bld: 118 mg/dL — ABNORMAL HIGH (ref 65–99)
Potassium: 4.2 mmol/L (ref 3.5–5.1)
Sodium: 141 mmol/L (ref 135–145)
Total Bilirubin: 1.2 mg/dL (ref 0.3–1.2)
Total Protein: 6 g/dL — ABNORMAL LOW (ref 6.5–8.1)

## 2017-08-06 LAB — GLUCOSE, CAPILLARY: Glucose-Capillary: 108 mg/dL — ABNORMAL HIGH (ref 65–99)

## 2017-08-06 MED ORDER — FAMOTIDINE 20 MG PO TABS
20.0000 mg | ORAL_TABLET | Freq: Two times a day (BID) | ORAL | Status: DC
  Administered 2017-08-06 – 2017-08-08 (×4): 20 mg via ORAL
  Filled 2017-08-06 (×4): qty 1

## 2017-08-06 MED ORDER — SERTRALINE HCL 50 MG PO TABS
75.0000 mg | ORAL_TABLET | Freq: Two times a day (BID) | ORAL | Status: DC
  Administered 2017-08-06 – 2017-08-08 (×5): 75 mg via ORAL
  Filled 2017-08-06 (×5): qty 2

## 2017-08-06 MED ORDER — BOOST / RESOURCE BREEZE PO LIQD CUSTOM
1.0000 | Freq: Two times a day (BID) | ORAL | Status: DC
  Administered 2017-08-07 – 2017-08-08 (×2): 1 via ORAL

## 2017-08-06 MED ORDER — ENSURE ENLIVE PO LIQD
237.0000 mL | Freq: Two times a day (BID) | ORAL | Status: DC
  Administered 2017-08-06 – 2017-08-08 (×4): 237 mL via ORAL

## 2017-08-06 NOTE — Progress Notes (Signed)
Initial Nutrition Assessment  DOCUMENTATION CODES:   Not applicable  INTERVENTION:  - Will order Ensure Enlive BID, each supplement provides 350 kcal and 20 grams of protein. - Will order Boost Breeze BID, each supplement provides 250 kcal and 9 grams of protein. - Continue to encourage PO intakes.  - Will order for new weight to be obtained.   NUTRITION DIAGNOSIS:   Increased nutrient needs related to acute illness(pancreatitis) as evidenced by estimated needs.  GOAL:   Patient will meet greater than or equal to 90% of their needs  MONITOR:   PO intake, Supplement acceptance, Weight trends, Labs, I & O's  REASON FOR ASSESSMENT:   Malnutrition Screening Tool  ASSESSMENT:   57 y.o. male with medical history significant for depression, chronic neck pain status post fusion, history of left carotid dissection managed conservatively, and previous opioid addiction and alcohol dependence. Patient presented to the ED for evaluation of severe epigastric pain, nausea, and abnormal outpatient imaging. Patient was reportedly evaluated in the outpatient clinic by gastroenterology for his severe abdominal pain and nausea. He underwent MRCP concerning for acute pancreatitis with necrosis and pseudocyst and was directed to the ED.  Patient reports that his abdominal pain has been persistent for the past 2 weeks, associated with severe nausea, but only rare episodes of vomiting. He reports excessive daily alcohol use in an attempt to alleviate his neck pain. Last alcoholic drink was on 1/61.   BMI indicates overweight status. Pt was on FLD until advancement at 1:15 PM to Soft diet. Pt consumed 75% of breakfast this AM which only provided 60 kcal and 6 grams of protein. For lunch he had ordered grilled cheese and ginger ale which had not arrived at the time of RD visit. Pt reports that he has been experiencing severe neck pain for ~2 weeks and that pain medications were not helping so he began drinking  2 bottles of wine/day during that time frame. He was experiencing severe nausea and abdominal pain with and without intake of wine and during the past 2 week period. Despite this, he only had a few episodes of emesis during the two week period. He was nervous to consume anything PO d/t nausea and pain and not wanting to vomit. Unable to obtain more clear details about items pt was able to consume during the 2 week period.   No muscle or fat wasting noted at this time. Pt reports that that over the past 3 months he has lost ~25 lbs. This was unable to be confirmed from weight hx in the chart; will order new weight to be obtained for comparison.  Medications reviewed; 20 mg oral Pepcid BID, 1 mg oral folic acid/day, daily multivitamin with minerals, 100 mg oral thiamine/day.  Labs reviewed; Ca: 8.7 mg/dL, LFTs elevated.        NUTRITION - FOCUSED PHYSICAL EXAM:  Completed; no muscle or fat wasting noted at this time.   Diet Order:   Diet Order           DIET SOFT Room service appropriate? Yes; Fluid consistency: Thin  Diet effective now          EDUCATION NEEDS:   No education needs have been identified at this time  Skin:  Skin Assessment: Reviewed RN Assessment  Last BM:  PTA/unknown  Height:   Ht Readings from Last 1 Encounters:  08/04/17 5\' 9"  (1.753 m)    Weight:   Wt Readings from Last 1 Encounters:  08/04/17 195 lb (  88.5 kg)    Ideal Body Weight:  72.73 kg  BMI:  Body mass index is 28.8 kg/m.  Estimated Nutritional Needs:   Kcal:  2035-2215 (23-25 kcal/kg)  Protein:  120-130 grams  Fluid:  >/= 2 L/day      Jarome Matin, MS, RD, LDN, [facility] Inpatient Clinical Dietitian Pager # [phone] After hours/weekend pager # [phone]

## 2017-08-06 NOTE — Progress Notes (Addendum)
The patient is without significant pain or nausea today, and is tolerating a combination liquid and soft diet.  On exam, there is no abdominal tenderness with particular reference to the epigastric area.  Impression: Possible recent pancreatitis with mild radiographic findings and normal lipase levels on this admission, but previous nausea and vomiting for several days.  It is possible that we are seeing the downslope of a recent attack of pancreatitis.  Recommendations:  1.  Detailed explanation regarding the potential for alcohol to damage the pancreas, and if that has happened, that minor amounts of alcohol exposure going forward may exacerbate significant symptoms.  Therefore, I have advised the patient, if possible, to completely avoid alcohol to be on the safe side, and he says "I am done with it."  2.  Continue gradual dietary advancement, as tolerated.  From the GI tract standpoint, discharge home at any time is probably reasonable, based on his food tolerance up to this point.  3.  We will sign off.  However, please feel free to call us if we can be of further assistance in this patient's care.  No specific GI follow-up visit is required.  His PCP can verify that the patient has had appropriate colon cancer screening, and can contact us if a screening examination is needed.  4.  Time for today's visit was about 20 minutes, more than 50% spent on counseling and education.  Cleotis Nipper, M.D. Pager [phone] If no answer or after 5 PM call [phone]

## 2017-08-06 NOTE — Progress Notes (Signed)
PROGRESS NOTE    Leonard FRONING  YSA:[id number] DOB: 10-04-1960 DOA: 08/04/2017 PCP: Leonard Arabian, MD     Brief Narrative:  Leonard [name] a 57 y.o.malewith medical history significant fordepression, chronic neck pain status post fusion, history of left carotid dissection managed conservatively, and previous opioid addiction and alcohol dependence, now presenting to the emergency department for evaluation of severe epigastric pain, nausea, and abnormal outpatient imaging. Patient was reportedly evaluated in the outpatient clinic by gastroenterology for his severe abdominal pain and nausea. He underwent MRCP concerning for acute pancreatitis with necrosis and pseudocyst and was directed to the ED. Patient reports that his abdominal pain has been persistent for the past 2 weeks, associated with severe nausea, but only rare episodes of vomiting. Denies jaundice or pruritus.  He reports excessive daily alcohol use in an attempt toalleviate his neck pain. Last alcohol drink was Thursday 5/30.   New events last 24 hours / Subjective: Feeling well this morning.  He denies any abdominal pain, had some nausea without vomiting.  Has been able to tolerate full liquid diet without issues.  Assessment & Plan:   Principal Problem:   Acute pancreatitis Active Problems:   Alcohol dependence ([facility])   Fatty infiltration of liver   Anxiety and depression   Elevated LFTs   Carotid artery dissection ([facility])   Chronic neck pain   Acute alcoholic pancreatitis   Acute alcoholic pancreatitis -Outpatient MRCP consistent with acute pancreatitis with areas of necrosis new pseudocyst - GI consulted, plan for supportive management.  Repeat CT/MRI in 4 to 6 weeks - LFT improving   Alcohol dependenceand withdrawal  -Reports hx of alcoholism with ~5 yrs abstinence until he developed worsening neck pain; reports that recent excessive daily drinking an attempt to alleviate his neck pain - CIWA     Chronic neck pain -Reports hx of cervical fusions with new pain and radiculopathy on left -Following with NSG and reports upcoming surgery July 11  Depression -Resume Zoloft    DVT prophylaxis: Lovenox Code Status: Full Family Communication: At bedside Disposition Plan: Pending improvement from abdominal standpoint, alcohol withdrawal, possible DC 6/2-6/3   Consultants:   GI  Procedures:   None   Antimicrobials:  Anti-infectives (From admission, onward)   None       Objective: Vitals:   08/05/17 2206 08/06/17 0442 08/06/17 1022 08/06/17 1219  BP: 128/85 (!) 146/94    Pulse: 89 100 100 96  Resp: 18 18    Temp: 97.9 F (36.6 C) 98.5 F (36.9 C)    TempSrc: Oral Oral    SpO2: 98% 98%    Weight:      Height:        Intake/Output Summary (Last 24 hours) at 08/06/2017 1313 Last data filed at 08/06/2017 0930 Gross per 24 hour  Intake 580 ml  Output -  Net 580 ml   Filed Weights   08/04/17 1938  Weight: 88.5 kg (195 lb)    Examination:  General exam: Appears calm and comfortable  Respiratory system: Clear to auscultation. Respiratory effort normal. Cardiovascular system: S1 & S2 heard, RRR. No JVD, murmurs, rubs, gallops or clicks. No pedal edema. Gastrointestinal system: Abdomen is nondistended, soft and nontender. No organomegaly or masses felt. Normal bowel sounds heard. Central nervous system: Alert and oriented. No focal neurological deficits. Extremities: Symmetric 5 x 5 power. Skin: No rashes, lesions or ulcers Psychiatry: Judgement and insight appear normal. Mood & affect appropriate.   Data Reviewed: I have personally  reviewed following labs and imaging studies  CBC: Recent Labs  Lab 08/04/17 1946 08/05/17 0410  WBC 4.7 3.9*  NEUTROABS  --  2.6  HGB 13.2 12.1*  HCT 38.9* 36.1*  MCV 97.0 98.4  PLT 196 725*   Basic Metabolic Panel: Recent Labs  Lab 08/04/17 1946 08/05/17 0410 08/06/17 0704  NA 140 140 141  K 4.2 4.4 4.2   CL 103 107 107  CO2 25 24 25   GLUCOSE 163* 106* 118*  BUN 7 7 11   CREATININE 0.75 0.83 0.90  CALCIUM 9.3 8.4* 8.7*   GFR: Estimated Creatinine Clearance: 98.5 mL/min (by C-G formula based on SCr of 0.9 mg/dL). Liver Function Tests: Recent Labs  Lab 08/04/17 1946 08/05/17 0410 08/06/17 0704  AST 193* 171* 125*  ALT 138* 119* 94*  ALKPHOS 236* 195* 174*  BILITOT 1.0 0.9 1.2  PROT 7.4 6.3* 6.0*  ALBUMIN 3.7 3.4* 3.3*   Recent Labs  Lab 08/04/17 1946 08/05/17 0410  LIPASE 49 44  AMYLASE  --  43   No results for input(s): AMMONIA in the last 168 hours. Coagulation Profile: No results for input(s): INR, PROTIME in the last 168 hours. Cardiac Enzymes: No results for input(s): CKTOTAL, CKMB, CKMBINDEX, TROPONINI in the last 168 hours. BNP (last 3 results) No results for input(s): PROBNP in the last 8760 hours. HbA1C: No results for input(s): HGBA1C in the last 72 hours. CBG: Recent Labs  Lab 08/05/17 0738 08/06/17 0730  GLUCAP 117* 108*   Lipid Profile: No results for input(s): CHOL, HDL, LDLCALC, TRIG, CHOLHDL, LDLDIRECT in the last 72 hours. Thyroid Function Tests: No results for input(s): TSH, T4TOTAL, FREET4, T3FREE, THYROIDAB in the last 72 hours. Anemia Panel: No results for input(s): VITAMINB12, FOLATE, FERRITIN, TIBC, IRON, RETICCTPCT in the last 72 hours. Sepsis Labs: No results for input(s): PROCALCITON, LATICACIDVEN in the last 168 hours.  No results found for this or any previous visit (from the past 240 hour(s)).     Radiology Studies: No results found.    Scheduled Meds: . enoxaparin (LOVENOX) injection  40 mg Subcutaneous Q24H  . famotidine  20 mg Oral BID  . folic acid  1 mg Oral Daily  . LORazepam  0-4 mg Intravenous Q6H   Followed by  . [START ON 08/07/2017] LORazepam  0-4 mg Intravenous Q12H  . multivitamin with minerals  1 tablet Oral Daily  . sertraline  75 mg Oral BID  . thiamine  100 mg Oral Daily   Or  . thiamine  100 mg  Intravenous Daily   Continuous Infusions:    LOS: 2 days    Time spent: 35 minutes   Leonard Phi, DO [facility] [email or web address] Password [email or web address] 08/06/2017, 1:13 PM

## 2017-08-07 LAB — COMPREHENSIVE METABOLIC PANEL
ALT: 83 U/L — ABNORMAL HIGH (ref 17–63)
AST: 108 U/L — ABNORMAL HIGH (ref 15–41)
Albumin: 3.2 g/dL — ABNORMAL LOW (ref 3.5–5.0)
Alkaline Phosphatase: 155 U/L — ABNORMAL HIGH (ref 38–126)
Anion gap: 7 (ref 5–15)
BUN: 5 mg/dL — ABNORMAL LOW (ref 6–20)
CO2: 27 mmol/L (ref 22–32)
Calcium: 8.7 mg/dL — ABNORMAL LOW (ref 8.9–10.3)
Chloride: 104 mmol/L (ref 101–111)
Creatinine, Ser: 0.76 mg/dL (ref 0.61–1.24)
GFR calc Af Amer: >60 mL/min (ref >60)
GFR calc non Af Amer: >60 mL/min (ref >60)
Glucose, Bld: 118 mg/dL — ABNORMAL HIGH (ref 65–99)
Potassium: 3.5 mmol/L (ref 3.5–5.1)
Sodium: 138 mmol/L (ref 135–145)
Total Bilirubin: 1 mg/dL (ref 0.3–1.2)
Total Protein: 5.9 g/dL — ABNORMAL LOW (ref 6.5–8.1)

## 2017-08-07 LAB — GLUCOSE, CAPILLARY
Glucose-Capillary: 167 mg/dL — ABNORMAL HIGH (ref 65–99)
Glucose-Capillary: 114 mg/dL — ABNORMAL HIGH (ref 65–99)
Glucose-Capillary: 127 mg/dL — ABNORMAL HIGH (ref 65–99)
Glucose-Capillary: 109 mg/dL — ABNORMAL HIGH (ref 65–99)

## 2017-08-07 LAB — CBC
HCT: 32.8 % — ABNORMAL LOW (ref 39.0–52.0)
Hemoglobin: 11 g/dL — ABNORMAL LOW (ref 13.0–17.0)
MCH: 32.9 pg (ref 26.0–34.0)
MCHC: 33.5 g/dL (ref 30.0–36.0)
MCV: 98.2 fL (ref 78.0–100.0)
Platelets: 121 10*3/uL — ABNORMAL LOW (ref 150–400)
RBC: 3.34 MIL/uL — ABNORMAL LOW (ref 4.22–5.81)
RDW: 13.2 % (ref 11.5–15.5)
WBC: 4.1 10*3/uL (ref 4.0–10.5)

## 2017-08-07 MED ORDER — METOCLOPRAMIDE HCL 10 MG PO TABS
20.0000 mg | ORAL_TABLET | Freq: Once | ORAL | Status: AC
  Administered 2017-08-07: 20 mg via ORAL
  Filled 2017-08-07: qty 2

## 2017-08-07 MED ORDER — INSULIN ASPART 100 UNIT/ML ~~LOC~~ SOLN
0.0000 [IU] | Freq: Three times a day (TID) | SUBCUTANEOUS | Status: DC
  Administered 2017-08-07 – 2017-08-08 (×2): 1 [IU] via SUBCUTANEOUS

## 2017-08-07 NOTE — Progress Notes (Addendum)
PROGRESS NOTE    Leonard [name]  IRJ:[id number] DOB: February 09, 1961 DOA: 08/04/2017 PCP: Gaynelle Arabian, MD     Brief Narrative:  Leonard [name] a 57 y.o.malewith medical history significant fordepression, chronic neck pain status post fusion, history of left carotid dissection managed conservatively, and previous opioid addiction and alcohol dependence, now presenting to the emergency department for evaluation of severe epigastric pain, nausea, and abnormal outpatient imaging. Patient was reportedly evaluated in the outpatient clinic by gastroenterology for his severe abdominal pain and nausea. He underwent MRCP concerning for acute pancreatitis with necrosis and pseudocyst and was directed to the ED. Patient reports that his abdominal pain has been persistent for the past 2 weeks, associated with severe nausea, but only rare episodes of vomiting. Denies jaundice or pruritus.  He reports excessive daily alcohol use in an attempt toalleviate his neck pain. Last alcohol drink was Thursday 5/30.   New events last 24 hours / Subjective: Tolerating soft diet without issues. No abdominal pain, nausea, vomiting.   Assessment & Plan:   Principal Problem:   Acute pancreatitis Active Problems:   Alcohol dependence (HCC)   Fatty infiltration of liver   Anxiety and depression   Elevated LFTs   Carotid artery dissection (HCC)   Chronic neck pain   Acute alcoholic pancreatitis   Acute alcoholic pancreatitis -Outpatient MRCP consistent with acute pancreatitis with areas of necrosis new pseudocyst - GI consulted, plan for supportive management.  Repeat CT/MRI in 4 to 6 weeks  Alcohol dependenceand withdrawal  -Reports hx of alcoholism with ~5 yrs abstinence until he developed worsening neck pain; reports that recent excessive daily drinking an attempt to alleviate his neck pain - Continue CIWA. Hand tremors today and has required 6mg  Ativan last 24 hours.    Chronic neck  pain -Reports hx of cervical fusions with new pain and radiculopathy on left -Following with NSG and reports upcoming surgery July 11  Depression -Resume Zoloft    DVT prophylaxis: Lovenox Code Status: Full Family Communication: At bedside Disposition Plan: Pending improvement from alcohol withdrawal, hopeful DC home 6/3   Consultants:   GI  Procedures:   None   Antimicrobials:  Anti-infectives (From admission, onward)   None       Objective: Vitals:   08/06/17 1857 08/06/17 2120 08/07/17 0516 08/07/17 0931  BP: (!) 147/93 (!) 135/96 (!) 148/101 (!) 133/91  Pulse: 98 (!) 101 98 (!) 103  Resp:   18   Temp: 98.4 F (36.9 C) 98.8 F (37.1 C) 98.9 F (37.2 C) 98.3 F (36.8 C)  TempSrc: Oral Oral Oral Oral  SpO2: 98% 96% 98% 97%  Weight:   81.7 kg (180 lb 1.9 oz)   Height:       No intake or output data in the 24 hours ending 08/07/17 1226 Filed Weights   08/04/17 1938 08/06/17 1500 08/07/17 0516  Weight: 88.5 kg (195 lb) 81.2 kg (179 lb) 81.7 kg (180 lb 1.9 oz)    Examination: General exam: Appears calm and comfortable  Respiratory system: Clear to auscultation. Respiratory effort normal. Cardiovascular system: S1 & S2 heard, RRR. No JVD, murmurs, rubs, gallops or clicks. No pedal edema. Gastrointestinal system: Abdomen is nondistended, soft and nontender. No organomegaly or masses felt. Normal bowel sounds heard. Central nervous system: Alert and oriented. No focal neurological deficits. +tremors of hands  Extremities: Symmetric 5 x 5 power. Skin: No rashes, lesions or ulcers Psychiatry: Judgement and insight appear normal. Mood & affect appropriate.  Data Reviewed: I have personally reviewed following labs and imaging studies  CBC: Recent Labs  Lab 08/04/17 1946 08/05/17 0410 08/07/17 0300  WBC 4.7 3.9* 4.1  NEUTROABS  --  2.6  --   HGB 13.2 12.1* 11.0*  HCT 38.9* 36.1* 32.8*  MCV 97.0 98.4 98.2  PLT 196 145* 947*   Basic Metabolic  Panel: Recent Labs  Lab 08/04/17 1946 08/05/17 0410 08/06/17 0704 08/07/17 0300  NA 140 140 141 138  K 4.2 4.4 4.2 3.5  CL 103 107 107 104  CO2 25 24 25 27   GLUCOSE 163* 106* 118* 118*  BUN 7 7 11  5*  CREATININE 0.75 0.83 0.90 0.76  CALCIUM 9.3 8.4* 8.7* 8.7*   GFR: Estimated Creatinine Clearance: 100.6 mL/min (by C-G formula based on SCr of 0.76 mg/dL). Liver Function Tests: Recent Labs  Lab 08/04/17 1946 08/05/17 0410 08/06/17 0704 08/07/17 0300  AST 193* 171* 125* 108*  ALT 138* 119* 94* 83*  ALKPHOS 236* 195* 174* 155*  BILITOT 1.0 0.9 1.2 1.0  PROT 7.4 6.3* 6.0* 5.9*  ALBUMIN 3.7 3.4* 3.3* 3.2*   Recent Labs  Lab 08/04/17 1946 08/05/17 0410  LIPASE 49 44  AMYLASE  --  43   No results for input(s): AMMONIA in the last 168 hours. Coagulation Profile: No results for input(s): INR, PROTIME in the last 168 hours. Cardiac Enzymes: No results for input(s): CKTOTAL, CKMB, CKMBINDEX, TROPONINI in the last 168 hours. BNP (last 3 results) No results for input(s): PROBNP in the last 8760 hours. HbA1C: No results for input(s): HGBA1C in the last 72 hours. CBG: Recent Labs  Lab 08/05/17 0738 08/06/17 0730 08/07/17 0749 08/07/17 1220  GLUCAP 117* 108* 167* 114*   Lipid Profile: No results for input(s): CHOL, HDL, LDLCALC, TRIG, CHOLHDL, LDLDIRECT in the last 72 hours. Thyroid Function Tests: No results for input(s): TSH, T4TOTAL, FREET4, T3FREE, THYROIDAB in the last 72 hours. Anemia Panel: No results for input(s): VITAMINB12, FOLATE, FERRITIN, TIBC, IRON, RETICCTPCT in the last 72 hours. Sepsis Labs: No results for input(s): PROCALCITON, LATICACIDVEN in the last 168 hours.  No results found for this or any previous visit (from the past 240 hour(s)).     Radiology Studies: No results found.    Scheduled Meds: . enoxaparin (LOVENOX) injection  40 mg Subcutaneous Q24H  . famotidine  20 mg Oral BID  . feeding supplement  1 Container Oral BID BM  .  feeding supplement (ENSURE ENLIVE)  237 mL Oral BID BM  . folic acid  1 mg Oral Daily  . insulin aspart  0-9 Units Subcutaneous TID WC  . LORazepam  0-4 mg Intravenous Q12H  . multivitamin with minerals  1 tablet Oral Daily  . sertraline  75 mg Oral BID  . thiamine  100 mg Oral Daily   Or  . thiamine  100 mg Intravenous Daily   Continuous Infusions:    LOS: 3 days    Time spent: 25 minutes   Dessa Phi, DO [facility] [email or web address] Password [facility] 08/07/2017, 12:26 PM

## 2017-08-08 DIAGNOSIS — I7771 Dissection of carotid artery: Secondary | ICD-10-CM

## 2017-08-08 DIAGNOSIS — K8521 Alcohol induced acute pancreatitis with uninfected necrosis: Principal | ICD-10-CM

## 2017-08-08 DIAGNOSIS — R945 Abnormal results of liver function studies: Secondary | ICD-10-CM

## 2017-08-08 DIAGNOSIS — M542 Cervicalgia: Secondary | ICD-10-CM

## 2017-08-08 DIAGNOSIS — F419 Anxiety disorder, unspecified: Secondary | ICD-10-CM

## 2017-08-08 DIAGNOSIS — F10230 Alcohol dependence with withdrawal, uncomplicated: Secondary | ICD-10-CM

## 2017-08-08 DIAGNOSIS — F329 Major depressive disorder, single episode, unspecified: Secondary | ICD-10-CM

## 2017-08-08 DIAGNOSIS — G8929 Other chronic pain: Secondary | ICD-10-CM

## 2017-08-08 LAB — COMPREHENSIVE METABOLIC PANEL
ALT: 75 U/L — ABNORMAL HIGH (ref 17–63)
AST: 97 U/L — ABNORMAL HIGH (ref 15–41)
Albumin: 3.3 g/dL — ABNORMAL LOW (ref 3.5–5.0)
Alkaline Phosphatase: 146 U/L — ABNORMAL HIGH (ref 38–126)
Anion gap: 10 (ref 5–15)
BUN: <5 mg/dL — ABNORMAL LOW (ref 6–20)
CO2: 26 mmol/L (ref 22–32)
Calcium: 9.2 mg/dL (ref 8.9–10.3)
Chloride: 106 mmol/L (ref 101–111)
Creatinine, Ser: 0.95 mg/dL (ref 0.61–1.24)
GFR calc Af Amer: >60 mL/min (ref >60)
GFR calc non Af Amer: >60 mL/min (ref >60)
Glucose, Bld: 112 mg/dL — ABNORMAL HIGH (ref 65–99)
Potassium: 4 mmol/L (ref 3.5–5.1)
Sodium: 142 mmol/L (ref 135–145)
Total Bilirubin: 1.2 mg/dL (ref 0.3–1.2)
Total Protein: 6.2 g/dL — ABNORMAL LOW (ref 6.5–8.1)

## 2017-08-08 LAB — GLUCOSE, CAPILLARY
Glucose-Capillary: 104 mg/dL — ABNORMAL HIGH (ref 65–99)
Glucose-Capillary: 140 mg/dL — ABNORMAL HIGH (ref 65–99)

## 2017-08-08 LAB — HEPATITIS PANEL, ACUTE
HCV Ab: <0.1 s/co ratio (ref 0.0–0.9)
Hep A IgM: NEGATIVE
Hep B C IgM: NEGATIVE
Hepatitis B Surface Ag: NEGATIVE

## 2017-08-08 NOTE — Discharge Summary (Signed)
Physician Discharge Summary  JUSTUS DROKE EZM:[id number] DOB: 1960-08-04 DOA: 08/04/2017  PCP: Gaynelle Arabian, MD  Admit date: 08/04/2017 Discharge date: 08/08/2017  Admitted From: Home Disposition: Home   Recommendations for Outpatient Follow-up:  1. Follow up with PCP in 1-2 weeks 2. Follow up with [facility] in 4-6 weeks, recommend repeat abdominal imaging about that time.  3. Continue alcohol cessation efforts at [facility] beginning 6/4.  4. Follow up with Dr. Vertell Limber for rescheduling of cervical spine surgery  Home Health: None Equipment/Devices: None Discharge Condition: Stable CODE STATUS: Full Diet recommendation: Heart healthy, low fat  Brief/Interim Summary: [name] a 57 y.o.malewith medical history significant fordepression, chronic lower back pain s/p posterior decompression/fusion by Dr. Sherwood Gambler, history of left carotid dissection managed conservatively, and previous opioid addiction and alcohol dependence who presented to the ED with severe epigastric pain, nausea, and abnormal outpatient imaging. Patient was reportedly evaluated in the outpatient clinic by gastroenterology for his severe abdominal pain and nausea. He underwent MRCP concerning for acute pancreatitis with necrosis and pseudocyst and was directed to the ED. Patient reports that his abdominal pain has been persistent for the past 2 weeks, associated with severe nausea, but only rare episodes of vomiting. Denies jaundice or pruritus.  He reports excessive daily alcohol use in an attempt toalleviate his neck pain. Last alcohol drink was Thursday 5/30. Lipase levels were within normal limits. He was admitted, provided with IV fluids and analgesics, antiemetics, and diet was made NPO, slowly advanced. [facility] followed along during hospitalization, and the patient's status improved. He is tolerating a soft diet with resolution of symptoms and no evidence of complication. Hospital course  was complicated by alcohol withdrawal treated per CIWA protocol with ativan. Last dose was early AM of 6/2 (>24 hours prior to discharge), and he has scheduled an evaluation at [place] 6/4. He states the desire to no longer drink alcohol.   Discharge Diagnoses:  Principal Problem:   Acute pancreatitis Active Problems:   Alcohol dependence ([place])   Fatty infiltration of liver   Anxiety and depression   Elevated LFTs   Carotid artery dissection (HCC)   Chronic neck pain   Acute alcoholic pancreatitis  Acute alcoholic pancreatitis: Outpatient MRCP consistent with acute pancreatitis with areas of necrosis new pseudocyst. Lipase levels normal, so it's possible this was ongoing symptoms from a pancreatitis that was resolving, and symptoms have improved with conservative measures.  - GI consulted, improved with supportive management Repeat CT/MRI in 4 to 6 weeks  Alcohol dependenceand withdrawal  -Reports hx of alcoholism with ~5 yrs abstinence until he developed worsening neck pain; reports that recent excessive daily drinking an attempt to alleviate his neck pain. - Withdrawal has resolved. Pt had desire not to drink and will follow up at [facility].    Chronic neck pain: Reports hx of cervical fusions with new pain and radiculopathy on left. - Continue po ibuprofen and topical diclofenac. Would avoid tylenol if possible given EtOH use. Avoiding opioids with h/o misuse. May benefit from PT and/or adjunctive Tx's.  -Following with neurosurgery, Dr. Vertell Limber, who has reportedly cancelled the previously scheduled surgery on July 11 in light of recent alcohol abuse and pancreatitis. Will need to follow up there soon.   Depression -Resume Zoloft   Discharge Instructions Discharge Instructions    Diet - low sodium heart healthy   Complete by:  As directed    Discharge instructions   Complete by:  As directed  Follow up with your primary doctor in the next couple weeks  and/or with gastroenterology in 4-6 weeks for reevaluation. You may need to repeat abdominal imaging at that time.   Continue to abstain from alcohol as this was the cause of pancreatitis, follow up with [facility] tomorrow as scheduled.   You may take ibuprofen by mouth and apply diclofenac topically as needed for pain. Follow up with Dr. Vertell Limber to discuss timing of surgery.   Increase activity slowly   Complete by:  As directed      Allergies as of 08/08/2017      Reactions   Sulfa Antibiotics Swelling, Rash   Face swells and causes welts   Zocor [simvastatin] Other (See Comments)   Elevated his liver enzymes      Medication List    TAKE these medications   aspirin 81 MG chewable tablet Chew 81 mg by mouth daily.   Diclofenac Sodium 3 % Gel Apply topically See admin instructions. Apply topically daily as directed to painful sites   ibuprofen 200 MG tablet Commonly known as:  ADVIL,MOTRIN Take 600 mg by mouth 2 (two) times daily as needed (for pain).   naphazoline 0.1 % ophthalmic solution Commonly known as:  NAPHCON Place 1 drop into both eyes 4 (four) times daily as needed for eye irritation or allergies.   sertraline 50 MG tablet Commonly known as:  ZOLOFT Take 75 mg by mouth 2 (two) times daily.   Vitamin D3 2000 units capsule Take 2,000 Units by mouth daily.      Follow-up Information    Gaynelle Arabian, MD. Schedule an appointment as soon as possible for a visit in 2 week(s).   Specialty:  Family Medicine Contact information: [address], [place] [address] [phone]        Gastroenterology, [facility]. Schedule an appointment as soon as possible for a visit in 1 month(s).   Contact information: [address] [place] [address] [phone]          Allergies  Allergen Reactions  . Sulfa Antibiotics Swelling and Rash    Face swells and causes welts  . Zocor [Simvastatin] Other (See Comments)    Elevated his  liver enzymes    Consultations:  [facility]  Procedures/Studies: US Abdomen Complete  Result Date: 07/28/2017 CLINICAL DATA:  Elevated LFTs. EXAM: ABDOMEN ULTRASOUND COMPLETE COMPARISON:  MRI 08/26/2016, 08/01/2015.  CT 07/29/2015, 07/17/2014. FINDINGS: Gallbladder: No gallstones or wall thickening visualized. No sonographic Murphy sign noted by sonographer. Common bile duct: Diameter: 8.2 mm Liver: The liver is echogenic consistent with fatty infiltration and/or hepatocellular disease. No focal hepatic abnormality identified. Portal vein is patent. IVC: No abnormality visualized. Pancreas: A 2.9 x 2.2 x 3.1 cm heterogeneous mass in the head of the pancreas. There is a 5.1 x 2.6 x 2.7 cm septated cystic process in the region of the tail of the pancreas. Spleen: Size and appearance within normal limits. Right Kidney: Length: 11.1 cm. Echogenicity within normal limits. No mass or hydronephrosis visualized. Left Kidney: Length: 12.0 cm. Echogenicity within normal limits. No mass or hydronephrosis visualized. 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 No evidence of aortic aneurysm. Other findings: None. IMPRESSION: 1. 2.9 x 2.2 x 3.1 cm heterogeneous mass in the head of the pancreas. Associated common bile duct dilatation to 8.2 mm noted. A 5.1 x 2.6 x 2.7 cm septated cystic mass is noted in the region of  the tail of the pancreas. These are new findings from prior MRI of 08/26/2016. Repeat MRI of the abdomen with MRCP is suggested for further evaluation. 2. Echogenic liver consistent with fatty infiltration or hepatocellular disease. 3. 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 Similar findings noted on prior CT of 07/17/2014. No evidence of abdominal aortic aneurysm. These results will be called to the ordering clinician or representative by the Radiologist Assistant, and communication documented in the PACS or zVision Dashboard.  Electronically Signed   By: Marcello Moores  Register   On: 07/28/2017 16:09   Mr Abdomen Mrcp W Wo Contast  Result Date: 08/03/2017 CLINICAL DATA:  Abdominal pain with nausea and vomiting for 1 month. Possible pancreatic mass on recent ultrasound. EXAM: MRI ABDOMEN WITHOUT AND WITH CONTRAST (INCLUDING MRCP) TECHNIQUE: Multiplanar multisequence MR imaging of the abdomen was performed both before and after the administration of intravenous contrast. Heavily T2-weighted images of the biliary and pancreatic ducts were obtained, and three-dimensional MRCP images were rendered by post processing. CONTRAST:  58mL MULTIHANCE GADOBENATE DIMEGLUMINE 529 MG/ML IV SOLN COMPARISON:  08/26/2016 and 08/01/2015 FINDINGS: Lower chest: No acute findings. Hepatobiliary: No hepatic masses identified. Severe diffuse hepatic steatosis is new since previous study. Gallbladder is unremarkable. No evidence of biliary ductal dilatation, with common bile duct measuring 6 mm in diameter. No evidence of choledocholithiasis. Pancreas: Diffuse pancreatic swelling and peripancreatic edema is seen, consistent with acute pancreatitis. Multiple new complex cystic lesions are seen in the pancreatic head and tail, and in the adjacent gastrosplenic ligament. Largest collection in the area the pancreatic tail measures 5.5 x 5.1 cm on image 48/14. These are most consistent with pancreatic necrosis and pseudocysts. No evidence of pancreatic ductal dilatation. 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 Spleen:  Within normal limits in size and appearance. Adrenals/Urinary Tract: No masses identified. No evidence of hydronephrosis. Stomach/Bowel: Visualized portion unremarkable. Vascular/Lymphatic: No pathologically enlarged lymph nodes identified. No abdominal aortic aneurysm. Other:  None. Musculoskeletal:  No suspicious bone lesions identified. IMPRESSION: Acute pancreatitis, with areas of pancreatic necrosis and new pseudocysts in the pancreatic  head and tail. 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 New severe diffuse hepatic steatosis. No evidence of hepatic neoplasm. No evidence of biliary ductal dilatation or choledocholithiasis. These results will be called to the ordering clinician or representative by the Radiologist Assistant, and communication documented in the PACS or zVision Dashboard. Electronically Signed   By: Earle Gell M.D.   On: 08/03/2017 14:29   Subjective: No abdominal pain, nausea or vomiting. Wants to go home for an evening prior to enrolling at [facility] tomorrow morning at 9:00am. No fevers.   Discharge Exam: Vitals:   08/08/17 0153 08/08/17 0521  BP: 131/90 (!) 135/95  Pulse: 95 91  Resp: 18 18  Temp: 99 F (37.2 C) 98.8 F (37.1 C)  SpO2: 99% 99%   General: Pt is alert, awake, not in acute distress Cardiovascular: RRR, S1/S2 +, no rubs, no gallops Respiratory: CTA bilaterally, no wheezing, no rhonchi Abdominal: Soft, not appreciably tender or distended, +BS.  Extremities: No edema, no cyanosis  Labs: Basic Metabolic Panel: Recent Labs  Lab 08/04/17 1946 08/05/17 0410 08/06/17 0704 08/07/17 0300 08/08/17 0725  NA 140 140 141 138 142  K 4.2 4.4 4.2 3.5 4.0  CL 103 107 107 104 106  CO2 25 24 25 27 26   GLUCOSE 163* 106* 118* 118* 112*  BUN 7 7 11  5* <5*  CREATININE 0.75 0.83 0.90 0.76 0.95  CALCIUM 9.3 8.4* 8.7* 8.7* 9.2   Liver Function Tests: Recent Labs  Lab 08/04/17 1946 08/05/17 0410 08/06/17 0704 08/07/17 0300 08/08/17 0725  AST 193* 171* 125* 108* 97*  ALT 138* 119* 94* 83* 75*  ALKPHOS 236* 195* 174* 155* 146*  BILITOT 1.0 0.9 1.2 1.0 1.2  PROT 7.4 6.3* 6.0* 5.9* 6.2*  ALBUMIN 3.7 3.4* 3.3* 3.2* 3.3*   Recent Labs  Lab 08/04/17 1946 08/05/17 0410  LIPASE 49 44  AMYLASE  --  43   CBC: Recent Labs  Lab 08/04/17 1946 08/05/17 0410 08/07/17 0300  WBC 4.7 3.9* 4.1  NEUTROABS  --  2.6  --   HGB 13.2 12.1* 11.0*  HCT 38.9* 36.1* 32.8*  MCV  97.0 98.4 98.2  PLT 196 145* 121*   Urinalysis    Component Value Date/Time   COLORURINE YELLOW 08/04/2017 1943   APPEARANCEUR CLEAR 08/04/2017 1943   LABSPEC 1.014 08/04/2017 1943   PHURINE 6.0 08/04/2017 1943   GLUCOSEU 150 (A) 08/04/2017 1943   HGBUR NEGATIVE 08/04/2017 [place] NEGATIVE 08/04/2017 1943   KETONESUR 5 (A) 08/04/2017 1943   PROTEINUR NEGATIVE 08/04/2017 1943   NITRITE NEGATIVE 08/04/2017 1943   LEUKOCYTESUR NEGATIVE 08/04/2017 1943    Time coordinating discharge: Approximately 40 minutes  Patrecia Pour, MD  [facility] 08/08/2017, 12:40 PM Pager [phone]

## 2017-08-08 NOTE — Progress Notes (Signed)
Patient discharged to home. Verbalizes understanding of all discharge instructions including discharge medications and follow up MD visits. Patient accompanied by spouse. 

## 2017-08-24 ENCOUNTER — Other Ambulatory Visit: Payer: Self-pay | Admitting: Neurosurgery

## 2017-09-26 ENCOUNTER — Other Ambulatory Visit: Payer: Self-pay | Admitting: Gastroenterology

## 2017-09-26 DIAGNOSIS — K8521 Alcohol induced acute pancreatitis with uninfected necrosis: Secondary | ICD-10-CM

## 2017-10-04 NOTE — H&P (Signed)
Patient ID:   [id number] Patient: Leonard [name]  Date of Birth: [birth year] Visit Type: Office Visit   Date: 09/22/2017 01:30 PM Provider: Leanora Ivanoff PAC  Historian: self  This 57 year old male presents for neck pain.  HISTORY OF PRESENT ILLNESS:  1.  neck pain  Leonard [name] presents to the clinic today after being seen by Dr. Maryjean [name] on August 17, 2017. Ultimately they met to discuss pre operative and postoperative pain management.  The patient has been through multiple neurosurgeons.  Initially he was seen by Dr. Carloyn [name]  and then Dr. Sherwood [name] and now Dr. Vertell [name].  He is an alcoholic and has had some drug addiction issues in the past.  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  The patient has trialed various over-the-counter medications as well as things like prednisone tapers with no relief.    He has a past surgical history to include cervical surgeries in 2004 and 2013 by Dr. Carloyn [name] at C4-5, C6-7, as well as a right radial nerve release in 2007 by Dr. Carloyn [name].  He had a bilateral L4-5 laminectomy in 2014 by Dr. Carloyn [name].  He had a right knee scope 2010.  He had a L4-5 redo decompression in 2015 by Dr. Sherwood [name] but then his wife requested he be transitioned to Dr. Vertell [name].  Dr. Vertell [name] evaluated the patient and ultimately felt he would need to proceed with another cervical surgery.  He requested the Dr. Maryjean [name] help with pre and post-surgical pain management.  The patient is currently scheduled for surgery on October 13, 2017. He called this week reporting that his legs were "buckling" and he had multiple falls.  He was brought in for evaluation.  Today he states he has actually only had one fall there was no injury.  He has some numbness in the right leg.  He states this is been present over the last four weeks.  He can feel the numbness  coming on and is able to sit so he can avoid injury.  He feels that is likely related to his neck and is hoping it will improve after surgery.  The patient has had multiple lumbar surgeries as well.  The numbness is currently in the anterior thigh.  He does however feel that the neck pain is the most severe problem for him.  He does not wish to proceed with any treatment of the numbness until after the surgery.  He presents today requesting narcotics.  He states he is utilizing 12 Advil a day.  He typically utilizes three tablets every 6 hr.  He will have to stop that the week of surgery and he is concerned and feels he needs to be in his narcotics.  He does not wish to utilize anything with Tylenol due to his liver function from being a chronic alcoholic.  Patient currently utilizes trazodone but this makes him very drowsy in the mornings.  He has begun utilizing only half a tablet at night and is utilizing it earlier in the evening because he is having difficulty waking up.  He decreased his gabapentin usage because he feels that the gabapentin and trazodone together make him more drowsy.  He is able to tolerate the gabapentin during the day without any drowsiness.  He is utilizing Robaxin as needed.  He rates his pain as 6/10 today.  He  appears to be in no acute distress.  He is able to easily ambulate through the office with no assistive devices.  He can go from a seated to standing position without difficulty.         Medical/Surgical/Interim History Reviewed, no change.  Last detailed document date:07/21/2017.     PAST MEDICAL HISTORY, SURGICAL HISTORY, FAMILY HISTORY, SOCIAL HISTORY AND REVIEW OF SYSTEMS I have reviewed the patient's past medical, surgical, family and social history as well as the comprehensive review of systems as included on the [facility] history form dated 08/17/2017, which I have signed.  Family History:  Reviewed, no changes.  Last detailed  document date:07/04/2013.   Social History: Reviewed, no changes. Last detailed document date: 07/04/2013.    MEDICATIONS: (added, continued or stopped this visit) Started Medication Directions Instruction Stopped   Advil 200 mg tablet take 3 tablet by oral route  every 4 hours as needed with food    09/15/2017 gabapentin 100 mg capsule take 1 capsule by oral route 3 times every day  09/22/2017  09/22/2017 gabapentin 100 mg capsule take 1-2 capsule by oral route 4 times every day    09/15/2017 Robaxin-750 750 mg tablet take 1 tablet by oral route  every 8 hours as needed     sertraline 50 mg tablet take 1 tablet by oral route 2 times every day     simvastatin 40 mg tablet take 1 tablet by oral route  every day in the evening  09/22/2017  08/26/2017 trazodone 150 mg tablet take 1 tablet by oral route qpm  09/22/2017  09/22/2017 trazodone 50 mg tablet take 1 tablet by oral route QHS     Vitamin D3 2,000 unit capsule Take 1 tablet daily       ALLERGIES: Ingredient Reaction Medication Name Comment  SULFA (SULFONAMIDE ANTIBIOTICS) Hives       REVIEW OF SYSTEMS   See scanned patient registration form, dated 08/17/2017, signed and dated on 09/22/2017  Review of Systems Details System Neg/Pos Details  Constitutional Negative Chills, Fatigue, Fever, Malaise, Night sweats, Weight gain and Weight loss.  ENMT Negative Ear drainage, Hearing loss, Nasal drainage, Otalgia, Sinus pressure and Sore throat.  Eyes Negative Eye discharge, Eye pain and Vision changes.  Respiratory Negative Chronic cough, Cough, Dyspnea, Known TB exposure and Wheezing.  Cardio Negative Chest pain, Claudication, Edema and Irregular heartbeat/palpitations.  GI Negative Abdominal pain, Blood in stool, Change in stool pattern, Constipation, Decreased appetite, Diarrhea, Heartburn, Nausea and Vomiting.  GU Negative Dysuria, Hematuria, Polyuria (Genitourinary), Urinary frequency, Urinary incontinence and Urinary retention.   Endocrine Negative Cold intolerance, Heat intolerance, Polydipsia and Polyphagia.  Neuro Positive Extremity weakness, Memory impairment, Numbness in extremity.  Neuro Negative Dizziness, Gait disturbance, Headache, Seizures and Tremors.  Psych Negative Anxiety, Depression and Insomnia.  Integumentary Negative Brittle hair, Brittle nails, Change in shape/size of mole(s), Hair loss, Hirsutism, Hives, Pruritus, Rash and Skin lesion.  MS Positive Back pain, Joint pain, Muscle weakness, Neck pain, Muscle cramps/spasms.  MS Negative Joint swelling.  Hema/Lymph Negative Easy bleeding, Easy bruising and Lymphadenopathy.  Allergic/Immuno Negative Contact allergy, Environmental allergies, Food allergies and Seasonal allergies.  Reproductive Negative Breast discharge, Breast lumps, Dysmenorrhea, Dyspareunia, History of abnormal PAP smear, Hot flashes, Irregular menses and Vaginal discharge.   PHYSICAL EXAM:   Vitals Date Temp F BP Pulse Ht In Wt Lb BMI BSA Pain Score  09/22/2017  136/83 72 70 188.2 27  6/10     PHYSICAL EXAM  General General Appearance: normal Mood/Affect: normal Orientation: normal Pulses/Edema: normal Gait/Station: normal, normal toe and heel walk Coordination: normal Respiratory: non-labored during exam Pupils: equal, anicteric Level of Distress: no acute distress Overall Appearance: Normal  Skin Cervical Spine: surgical scars  Respiratory Lungs: non-labored  Neurological Orientation: normal Recent and Remote Memory: normal Attention Span and Concentration:   normal Language: normal Fund of Knowledge: normal  Stability Cervical Spine: motion is with pain Lumbar Spine: normal  Range of Motion Cervical Spine: all mild, decreased range of motion Lumbar Spine: all mild, decreased range of motion   Cranial Nerves II. Optic Nerve/Visual Fields: normal III. Oculomotor: normal IV. Trochlear: normal V. Trigeminal: normal VI. Abducens: normal VII.  Facial: normal VIII. Acoustic/Vestibular: normal IX. Glossopharyngeal: normal X. Vagus: normal XI. Spinal Accessory: normal XII. 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 Maryjean [name] and Dr. Vertell [name].  For now I would actually recommend beginning to wean off the trazodone since it sounds like it is too sedating.  He states he cannot sleep at night due to the pain.  Gabapentin would be the better option for managing the symptoms.  I have recommended trying to increase his gabapentin usage to see if this can help better control his pain and may help to provide him some benefit as he weans off of that Advil as well.  He does not wish to proceed with physical therapy to address the falls just yet.  He states he will wait till after his surgery.  His main concern is getting opioid pain medication. We will schedule him a follow-up after his surgery.  Orders: Instruction(s)/Education: Assessment Instruction  R03.0 Hypertension education  Z68.27 Dietary management education, guidance, and counseling   Completed Orders (this encounter) Order Details Reason Side Interpretation Result Initial Treatment Date Region  Hypertension education Continue to monitor blood pressure. If blood pressure continues to be elevated contact Primary Care Provider.        Dietary management education, guidance, and counseling Encouraged to eat a well balanced diet and follow up with Primary Care Provider.         Assessment/Plan   # Detail Type Description   1. Assessment Alcohol abuse (F10.10).       2. Assessment Cervical stenosis of spinal canal (M48.02).       3. Assessment Cervical radiculopathy (M54.12).       4. Assessment Lumbar degenerative disc disease (M51.36).       5. Assessment Elevated blood-pressure reading, w/o diagnosis of htn (R03.0).       6. Assessment Body mass index (BMI) 27.0-27.9,  adult (Y81.85).   Plan Orders Today's instructions / counseling include(s) Dietary management education, guidance, and counseling. Clinical information/comments: Encouraged to eat a well balanced diet and follow up with Primary Care Provider.         Pain Management Plan Pain Scale: 6/10. Method: Numeric Pain Intensity Scale. Location: neck, right arm. Onset: 05/01/2017. Duration: varies. Quality: discomforting. Pain management follow-up plan of care: Patient alternating heat and ice..  Fall Risk Plan The patient has fallen 1 times in the last year.  Falls risk follow-up plan of care: Assisted devices: Patient has been advised to use safety measures when available.Marland Kitchen     MEDICATIONS PRESCRIBED TODAY    Rx Quantity Refills  TRAZODONE HCL 50 mg  30 0  GABAPENTIN 100 mg  240 0  Provider:  Leanora Ivanoff [facility]  09/22/2017 02:07 PM  Under the supervision of Bonna Gains PhD MD Dictation edited by: Leanora Ivanoff [facility]    CC Providers: [facility] [place],  [address]  [id number]-   Robert Nudelman MD  [place], [place] [address]               Electronically signed by Leanora Ivanoff PAC on 09/22/2017 02:07 PM  on behalf of Bonna Gains PhD MD  Patient ID:   [id number] Patient: Leonard [name]  Date of Birth: 1960/08/05 Visit Type: Office Visit   Date: 08/17/2017 12:00 PM Provider: Bonna Gains PhD MD   This 57 year old male presents for neck pain.  HISTORY OF PRESENT ILLNESS:  1.  neck pain  57 year old right-handed gentleman, accompanied by his wife, referred by my partner Dr. Vertell [name] for assistance managing pre and postoperative pain in the setting of previous opioid alcohol abuse issues.  Mr. Festus Aloe has undergone cervical surgery in the past, but had difficulty weaning from narcotics.  He has a substantial family history of alcohol abuse, with his father dying of cirrhosis at a  relatively young age.  History is he was in a relatively good state of health in February 2019, when he awoke with substantial neck pain, with radiating symptoms into the left upper extremity.  He was found to have clinical signs, an MRI was obtained that demonstrated two-level cervical disease for which Dr. Vertell [name] proposed surgery.  Dr. Vertell [name] requested that I assist with his pre and postoperative management, given the patient's complicated previous history.  Unfortunately, history and she was hospitalized shortly after that request, for pancreatitis.  He was treated, stabilized, and released with the plan for him to go to [facility] for inpatient management, but ultimately they thought he needed to have surgery before coming to that facility.  He finally comes to see me today.  He describes continued left-sided neck pain with radiation into the upper extremity, including worsening arm weakness.  He describes the quality of his pain largely as sharp, or knife-like with radiating symptoms into the triceps particularly.  He has been utilizing ibuprofen, 600 mg every 6 hr with modest improvement but not great.  He reports that about 70% of his pain is isolated to the left neck, with 30% of it being more neuropathic or radicular.          PAST MEDICAL HISTORY, SURGICAL HISTORY, FAMILY HISTORY, SOCIAL HISTORY AND REVIEW OF SYSTEMS I have reviewed the patient's past medical, surgical, family and social history as well as the comprehensive review of systems as included on the [facility] history form dated 08/17/2017, which I have signed.   MEDICATIONS: (added, continued or stopped this visit) Started Medication Directions Instruction Stopped   Advil 200 mg tablet take 3 tablet by oral route  every 4 hours as needed with food     sertraline 50 mg tablet take 1 tablet by oral route 2 times every day     simvastatin 40 mg tablet take 1 tablet by oral route  every day in the  evening     Vitamin D3 2,000 unit capsule Take 1 tablet daily       ALLERGIES: Ingredient Reaction Medication Name Comment  SULFA (SULFONAMIDE ANTIBIOTICS) Hives       REVIEW OF SYSTEMS   See scanned patient registration form, dated 08/17/2017, signed and  dated on 08/17/2017  Review of Systems Details System Neg/Pos Details  Constitutional Negative Chills, Fatigue, Fever, Malaise, Night sweats, Weight gain and Weight loss.  ENMT Negative Ear drainage, Hearing loss, Nasal drainage, Otalgia, Sinus pressure and Sore throat.  Eyes Negative Eye discharge, Eye pain and Vision changes.  Respiratory Negative Chronic cough, Cough, Dyspnea, Known TB exposure and Wheezing.  Cardio Negative Chest pain, Claudication, Edema and Irregular heartbeat/palpitations.  GI Negative Abdominal pain, Blood in stool, Change in stool pattern, Constipation, Decreased appetite, Diarrhea, Heartburn, Nausea and Vomiting.  GU Negative Dysuria, Hematuria, Polyuria (Genitourinary), Urinary frequency, Urinary incontinence and Urinary retention.  Endocrine Negative Cold intolerance, Heat intolerance, Polydipsia and Polyphagia.  Neuro Negative Dizziness, Extremity weakness, Gait disturbance, Headache, Memory impairment, Numbness in extremity, Seizures and Tremors.  Psych Negative Anxiety, Depression and Insomnia.  Integumentary Negative Brittle hair, Brittle nails, Change in shape/size of mole(s), Hair loss, Hirsutism, Hives, Pruritus, Rash and Skin lesion.  MS Positive Neck pain.  MS Negative Back pain, Joint pain, Joint swelling and Muscle weakness.  Hema/Lymph Negative Easy bleeding, Easy bruising and Lymphadenopathy.  Allergic/Immuno Negative Contact allergy, Environmental allergies, Food allergies and Seasonal allergies.  Reproductive Negative Breast discharge and Breast lumps.   PHYSICAL EXAM:   Vitals Date Temp F BP Pulse Ht In Wt Lb BMI BSA Pain Score  08/17/2017  136/87 84 70 180 25.83      PHYSICAL  EXAM Details General Level of Distress: no acute distress Overall Appearance: normal    Cardiovascular Cardiac: normal  Respiratory Lungs: non-labored  Neurological Orientation: normal Recent and Remote Memory: normal Attention Span and Concentration:   normal Language: normal Fund of Knowledge: normal  Right Left Sensation: normal normal Upper Extremity Coordination: normal normal  Lower Extremity Coordination: normal normal   Motor Strength Upper extremity motor strength was tested in the clinically pertinent muscles. Any abnormal findings will be noted below.   Right Left Triceps:  4/5 Wrist Extensor:  4+/5   Deep Tendon Reflexes  Right Left Biceps: normal normal Triceps: normal decrease Brachioradialis: normal normal  Cranial Nerves II. Optic Nerve/Visual Fields: normal III. Oculomotor: normal IV. Trochlear: normal V. Trigeminal: normal VI. Abducens: normal VII. Facial: normal VIII. Acoustic/Vestibular: normal IX. Glossopharyngeal: normal X. Vagus: normal XI. Spinal Accessory: normal XII. Hypoglossal: normal     DIAGNOSTIC RESULTS:   EXAM: MRI CERVICAL SPINE WITHOUT CONTRAST  TECHNIQUE: Multiplanar, multisequence MR imaging of the cervical spine was performed. No intravenous contrast was administered.  COMPARISON: 05/24/2017 CTA neck includes the cervical spine. Prior MR cervical spine 07/16/2009.  FINDINGS: Alignment: Straightening of the normal cervical lordosis. No subluxation.  Vertebrae: Status post ACDF C3-4 and C6-7. Solid arthrodesis C3-4. Incomplete arthrodesis C6-7. No worrisome osseous lesion. Susceptibility related to plate and screws at D2-2 and C6-7.  Cord: Stenosis with cord flattening maximal at C5-6, also at C4-5 and C2-3. No abnormal cord signal.  Posterior Fossa, vertebral arteries, paraspinal tissues: No tonsillar herniation. Diminutive LEFT vertebral artery.  Disc levels:  C2-3: LEFT paracentral disc  extrusion. Slight cord flattening. No foraminal narrowing.  C3-4: Solid arthrodesis. No impingement.  C4-5: Broad-based disc protrusion, central and to the LEFT. Asymmetric LEFT-sided facet arthropathy. LEFT greater than RIGHT C5 foraminal narrowing. Mild stenosis with slight cord flattening, no abnormal cord signal.  C5-6: Central and leftward disc extrusion, extends into the LEFT neural foramen. Mild cord flattening. Significant LEFT greater than RIGHT C6 foraminal narrowing.  C6-7: Incomplete arthrodesis, but no residual impingement. This level demonstrates some areas of interbody  bony bridging, as well as what appears to be LEFT lateral osteophyte bridging.  C7-T1: BILATERAL facet arthropathy. No impingement.  Compared with 2011, the changes at C4-5 and C5-6 are worse.  IMPRESSION: Potentially symptomatic LEFT-sided neural impingement at both C4-5 and C5-6.  Central and leftward extrusion at C5-6 with mild cord flattening. Significant LEFT greater than RIGHT C6 foraminal narrowing.  Broad-based disc protrusion central and to the LEFT at C4-5 with asymmetric LEFT-sided facet arthropathy. Mild stenosis with cord flattening and LEFT greater than RIGHT C5 foraminal narrowing.  Likely incidental LEFT paracentral extrusion at C2-C3 with slight cord flattening but no foraminal narrowing.  Status post C3-4 and C6-7 ACDF. Solid arthrodesis at C3-4. Incomplete arthrodesis, but probable functionally fused, C6-7.   Electronically Signed By: Staci Righter M.D. On: 06/07/2017 14:37      IMPRESSION:   Two-level cervical disease, with associated pain, radiculopathy; past history significant for alcohol abuse, difficulty weaning/opioid dependency  PLAN:  1. Have spoken with Dr. Vertell [name], about rescheduling the patient surgery.  We will coordinate together to postoperatively manage this patient's surgical pain.  Would evaluate criteria as well [facility].  2. Insofar as preoperative pain management, were going to give him trials of methocarbamol, and gabapentin.  Hopefully we can achieve some symptom management along with his NSAID use (which seems appropriate).  Postoperatively, would likely utilize buprenorphine patch with intent to wean fairly quickly     Assessment/Plan   # Detail Type Description   1. Assessment Cervical stenosis of spinal canal (M48.02).       2. Assessment Alcohol abuse (F10.10).                     Provider:  Maryjean Ka PhD MD, Hall Busing 08/17/2017 12:51 PM  Dictation edited by: Bonna Gains    CC Providers: [facility] [address] [place],  [place]  [address]-   Robert Nudelman MD  [address] [place], [place] [address]              Electronically signed by Bonna Gains PhD MD on 08/17/2017 12:51 PM  Patient ID:   [id number] Patient: Leonard [name]  Date of Birth: [birth year] Visit Type: Office Visit   Date: 07/21/2017 01:15 PM Provider: Marchia Meiers. Leonard Limber MD   This 57 year old male presents for neck pain.  HISTORY OF PRESENT ILLNESS:  1.  neck pain  Leonard [name], 56 year old male, and former patient of Dr. Sherwood [name] and Dr. Carloyn [name], visits for evaluation.  He is employed as a Clinical biochemist, but states he is no longer required to do heavy lifting or climbing.  Patient reports waking February 24th with neck pain and left shoulder pain.  Symptoms have increased over the past 2 months to include left arm numbness tingling and weakness.   Prednisone taper offered no relief Advil taken p.r.n.  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 Carloyn [name] (C4-5, C6-7) right radial nerve release 2007 by Dr. Carloyn [name], bilateral L4-5  laminectomy 2014 by Dr. Carloyn [name]; appendectomy years ago; right knee scope 2010; L4-5 redo decompression 2015 by Dr. Launa Flight on [place]  Pt c/o neck and supra scapular pain with associated left arm pain. Positive left Spurling test. Triceps 4/5 with 4-/5  wrist flexion. MRI shows C4-5, C5-6 bone spurs causing radiculopathy worse on the left. Pain is worse at night. Pts wife reports it limiting his livelihood and quality of life. Pt reports drinking a bottle of wine per day, and struggles with pain management. He has a hx of alcohol and narcotic abuse, he detoxed before his surgery with Dr. Sherwood [name]. C4-5, C5-6 ACDF with possible exploration of C3-4, C6-7. Pt needs referral to Dr. Maryjean [name] for pre and post-surgical pain management.           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  Family History:  Reviewed, no changes.  Last detailed document date:07/04/2013.   Social History: Reviewed, no changes. Last detailed document date: 07/04/2013.    MEDICATIONS: (added, continued or stopped this visit) Started Medication Directions Instruction Stopped    Advil 200 mg tablet take 3 tablet by oral route  every 4 hours as needed with food     sertraline 50 mg tablet take 1 tablet by oral route 2 times every day     simvastatin 40 mg tablet take 1 tablet by oral route  every day in the evening     Vitamin D3 2,000 unit capsule Take 1 tablet daily       ALLERGIES: Ingredient Reaction Medication Name Comment  SULFA (SULFONAMIDE ANTIBIOTICS) Hives     Reviewed, no changes.    PHYSICAL EXAM:   Vitals Date Temp F BP Pulse Ht In Wt Lb BMI BSA Pain Score  07/21/2017  119/82 114 70 181 25.97  5/10      IMPRESSION:   Pt c/o neck and supra scapular pain with associated left arm pain. Positive left Spurling test. Triceps 4/5 with 4-/5 wrist flexion. MRI shows C4-5, C5-6 bone spurs causing radiculopathy worse on the left. Pain is worse at night. Pts wife reports it limiting his livelihood and quality of life. Pt reports drinking a bottle of wine per day, and struggles with pain management. He has a hx of alcohol and narcotic abuse, he detoxed before his surgery with Dr. Sherwood [name]. C4-5, C5-6 ACDF with possible exploration of C3-4, C6-7. Pt needs referral to Dr. Maryjean [name] for pre and post-surgical pain management.  PLAN:  Schedule ACDF of C4-5, C5-6 with possible exploration of C3-4, C6-7. Referral to Dr. Maryjean [name] for pre and post-surgical pain management. Nurse education given.  Orders: Diagnostic Procedures: Assessment Procedure  M54.12 Cervical Spine- AP/Lat/Flex/Ex  M54.12 Cervical Spine- Lateral  Instruction(s)/Education: Assessment Instruction  R03.0 Hypertension education  Z68.25 Dietary management education, guidance, and counseling  Miscellaneous: Assessment   M48.02 Vista Hard Collar Universal Se   Completed Orders (this encounter) Order Details Reason Side Interpretation Result Initial Treatment Date Region  Cervical Spine- AP/Lat/Flex/Ex W/ SWIMMERS     07/21/2017 All Levels to All Levels  Hypertension education Continue to  monitor blood pressure. If blood pressure remains elevated contact primary care provider        Dietary management education, guidance, and counseling patient encouraged to eat a well balanced diet         Assessment/Plan   # Detail Type Description   1. Assessment Cervical radiculopathy (M54.12).   Plan Orders Referral to Clydell Hakim PhD MD for Pain Management.       2. Assessment Cervical stenosis of spinal canal (M48.02).   Plan Orders Vista Hard Collar Universal Se.       3. Assessment Herniated nucleus pulposus, cervical (M50.20).       4. Assessment Elevated blood-pressure reading, w/o diagnosis of htn (R03.0).       5. Assessment Body mass index (BMI) 25.0-25.9, adult (A19.37).   Plan Orders Today's instructions / counseling include(s) Dietary management education, guidance, and counseling.         Pain Management Plan Pain Scale: 5/10. Method: Numeric Pain Intensity Scale. Location: back. Onset: 05/01/2017. Duration: varies. Quality: discomforting. Pain management follow-up plan of care: Patient is taking OTC pain relievers for relief..              Provider:  Vertell Limber MD, Marchia Meiers 07/23/2017 1:26 PM  Dictation edited by: Marchia Meiers. Leonard [name]    CC Providers: [facility] [address] [place],  [place]  [id number]-   Robert Nudelman MD  [address] [place], [place] [address]              Electronically signed by Marchia Meiers. Leonard Limber MD on 07/23/2017 01:26 PM

## 2017-10-04 NOTE — Pre-Procedure Instructions (Signed)
Leonard [name]  10/04/2017      [facility] - [place], [place] [address] [place] [address] Phone: [phone] Fax: [phone]    Your procedure is scheduled on Aug. 8  Report to [facility] Admitting at 11:30 A.M.  Call this number if you have problems the morning of surgery:  [phone]   Remember:  Do not eat or drink after midnight.                      Take these medicines the morning of surgery with A SIP OF WATER :              Gabapentin (neurontin)             Methocarbamol (robaxin)             Eye drops if needed             Sertraline (zoloft)             7 days prior to surgery STOP taking Aleve, Naproxen, Ibuprofen, Motrin, Advil, Goody's, BC's, all herbal medications, fish oil, and all vitamins               Follow your surgeon's instructions on when to stop Asprin.  If no instructions were given by your surgeon then you will need to call the office to get those instructions.      Do not wear jewelry.  Do not wear lotions, powders, or perfumes, or deodorant.  Do not shave 48 hours prior to surgery.  Men may shave face and neck.  Do not bring valuables to the hospital.  [facility] is not responsible for any belongings or valuables.  Contacts, dentures or bridgework may not be worn into surgery.  Leave your suitcase in the car.  After surgery it may be brought to your room.  For patients admitted to the hospital, discharge time will be determined by your treatment team.  Patients discharged the day of surgery will not be allowed to drive home.    Special instructions:  [place]- Preparing For Surgery  Before surgery, you can play an important role. Because skin is not sterile, your skin needs to be as free of germs as possible. You can reduce the number of germs on your skin by washing with CHG (chlorahexidine gluconate) Soap before surgery.  CHG is an antiseptic cleaner which  kills germs and bonds with the skin to continue killing germs even after washing.    Oral Hygiene is also important to reduce your risk of infection.  Remember - BRUSH YOUR TEETH THE MORNING OF SURGERY WITH YOUR REGULAR TOOTHPASTE  Please do not use if you have an allergy to CHG or antibacterial soaps. If your skin becomes reddened/irritated stop using the CHG.  Do not shave (including legs and underarms) for at least 48 hours prior to first CHG shower. It is OK to shave your face.  Please follow these instructions carefully.   1. Shower the NIGHT BEFORE SURGERY and the MORNING OF SURGERY with CHG.   2. If you chose to wash your hair, wash your hair first as usual with your normal shampoo.  3. After you shampoo, rinse your hair and body thoroughly to remove the shampoo.  4. Use CHG as you would any other liquid soap. You can apply CHG directly to the skin and wash gently with a  scrungie or a clean washcloth.   5. Apply the CHG Soap to your body ONLY FROM THE NECK DOWN.  Do not use on open wounds or open sores. Avoid contact with your eyes, ears, mouth and genitals (private parts). Wash Face and genitals (private parts)  with your normal soap.  6. Wash thoroughly, paying special attention to the area where your surgery will be performed.  7. Thoroughly rinse your body with warm water from the neck down.  8. DO NOT shower/wash with your normal soap after using and rinsing off the CHG Soap.  9. Pat yourself dry with a CLEAN TOWEL.  10. Wear CLEAN PAJAMAS to bed the night before surgery, wear comfortable clothes the morning of surgery  11. Place CLEAN SHEETS on your bed the night of your first shower and DO NOT SLEEP WITH PETS.    Day of Surgery:  Do not apply any deodorants/lotions.  Please wear clean clothes to the hospital/surgery center.   Remember to brush your teeth WITH YOUR REGULAR TOOTHPASTE.    Please read over the following fact sheets that you were given. Coughing  and Deep Breathing, MRSA Information and Surgical Site Infection Prevention

## 2017-10-05 ENCOUNTER — Encounter (HOSPITAL_COMMUNITY): Payer: Self-pay

## 2017-10-05 ENCOUNTER — Encounter (HOSPITAL_COMMUNITY)
Admission: RE | Admit: 2017-10-05 | Discharge: 2017-10-05 | Disposition: A | Discharge status: Home / Self Care | Payer: PRIVATE HEALTH INSURANCE | Source: Ambulatory Visit | Attending: Neurosurgery | Admitting: Neurosurgery

## 2017-10-05 ENCOUNTER — Other Ambulatory Visit: Payer: Self-pay

## 2017-10-05 DIAGNOSIS — Z01812 Encounter for preprocedural laboratory examination: Secondary | ICD-10-CM | POA: Insufficient documentation

## 2017-10-05 HISTORY — DX: Acute pancreatitis without necrosis or infection, unspecified: K85.90

## 2017-10-05 LAB — COMPREHENSIVE METABOLIC PANEL
ALT: 28 U/L (ref 0–44)
AST: 25 U/L (ref 15–41)
Albumin: 4 g/dL (ref 3.5–5.0)
Alkaline Phosphatase: 32 U/L — ABNORMAL LOW (ref 38–126)
Anion gap: 7 (ref 5–15)
BUN: 17 mg/dL (ref 6–20)
CO2: 24 mmol/L (ref 22–32)
Calcium: 9.4 mg/dL (ref 8.9–10.3)
Chloride: 107 mmol/L (ref 98–111)
Creatinine, Ser: 0.99 mg/dL (ref 0.61–1.24)
GFR calc Af Amer: >60 mL/min (ref >60)
GFR calc non Af Amer: >60 mL/min (ref >60)
Glucose, Bld: 116 mg/dL — ABNORMAL HIGH (ref 70–99)
Potassium: 4.5 mmol/L (ref 3.5–5.1)
Sodium: 138 mmol/L (ref 135–145)
Total Bilirubin: 0.8 mg/dL (ref 0.3–1.2)
Total Protein: 6.5 g/dL (ref 6.5–8.1)

## 2017-10-05 LAB — SURGICAL PCR SCREEN
MRSA, PCR: NEGATIVE
Staphylococcus aureus: POSITIVE — AB

## 2017-10-05 LAB — CBC
HCT: 40.9 % (ref 39.0–52.0)
Hemoglobin: 13.5 g/dL (ref 13.0–17.0)
MCH: 31.7 pg (ref 26.0–34.0)
MCHC: 33 g/dL (ref 30.0–36.0)
MCV: 96 fL (ref 78.0–100.0)
Platelets: 180 10*3/uL (ref 150–400)
RBC: 4.26 MIL/uL (ref 4.22–5.81)
RDW: 11.9 % (ref 11.5–15.5)
WBC: 4.4 10*3/uL (ref 4.0–10.5)

## 2017-10-05 LAB — TYPE AND SCREEN
ABO/RH(D): A POS
Antibody Screen: NEGATIVE

## 2017-10-05 NOTE — Progress Notes (Signed)
PCR +staph. Mupirocin called in to [facility] per patient request. Left message for patient.

## 2017-10-05 NOTE — Progress Notes (Signed)
PCP: Dr. Marisue Humble   no cardiologist at present, has seen Dr. Gwenlyn Found in past 7/15 for follow up of abnormal ekg for surgical clearance.   Pt. To stop aspirin 10/06/17 per Dr. Vertell Limber.

## 2017-10-13 ENCOUNTER — Other Ambulatory Visit: Payer: Self-pay

## 2017-10-13 ENCOUNTER — Ambulatory Visit (HOSPITAL_COMMUNITY): Payer: PRIVATE HEALTH INSURANCE | Admitting: Certified Registered"

## 2017-10-13 ENCOUNTER — Inpatient Hospital Stay (HOSPITAL_COMMUNITY)
Admission: RE | Disposition: A | Discharge status: Home / Self Care | Payer: Self-pay | Source: Home / Self Care | Attending: Neurosurgery

## 2017-10-13 ENCOUNTER — Encounter (HOSPITAL_COMMUNITY): Payer: Self-pay | Admitting: Surgery

## 2017-10-13 ENCOUNTER — Inpatient Hospital Stay (HOSPITAL_COMMUNITY)
Admission: RE | Admit: 2017-10-13 | Discharge: 2017-10-14 | DRG: 473 | Disposition: A | Discharge status: Home / Self Care | Payer: PRIVATE HEALTH INSURANCE | Attending: Neurosurgery | Admitting: Neurosurgery

## 2017-10-13 ENCOUNTER — Ambulatory Visit (HOSPITAL_COMMUNITY): Payer: PRIVATE HEALTH INSURANCE

## 2017-10-13 DIAGNOSIS — M5011 Cervical disc disorder with radiculopathy,  high cervical region: Secondary | ICD-10-CM | POA: Diagnosis present

## 2017-10-13 DIAGNOSIS — M4802 Spinal stenosis, cervical region: Secondary | ICD-10-CM | POA: Diagnosis present

## 2017-10-13 DIAGNOSIS — F1020 Alcohol dependence, uncomplicated: Secondary | ICD-10-CM | POA: Diagnosis present

## 2017-10-13 DIAGNOSIS — R296 Repeated falls: Secondary | ICD-10-CM | POA: Diagnosis present

## 2017-10-13 DIAGNOSIS — Z419 Encounter for procedure for purposes other than remedying health state, unspecified: Secondary | ICD-10-CM

## 2017-10-13 HISTORY — PX: ANTERIOR CERVICAL DECOMP/DISCECTOMY FUSION: SHX1161

## 2017-10-13 SURGERY — ANTERIOR CERVICAL DECOMPRESSION/DISCECTOMY FUSION 2 LEVEL/HARDWARE REMOVAL
Anesthesia: General | Site: Spine Cervical

## 2017-10-13 MED ORDER — HYDROCODONE-ACETAMINOPHEN 5-325 MG PO TABS: 2.0000 | ORAL_TABLET | ORAL | Status: DC | PRN

## 2017-10-13 MED ORDER — MENTHOL 3 MG MT LOZG
1.0000 | LOZENGE | OROMUCOSAL | Status: DC | PRN
  Filled 2017-10-13: qty 9

## 2017-10-13 MED ORDER — ONDANSETRON HCL 4 MG/2ML IJ SOLN
INTRAMUSCULAR | Status: AC
  Filled 2017-10-13: qty 2

## 2017-10-13 MED ORDER — ACETAMINOPHEN 500 MG PO TABS
1000.0000 mg | ORAL_TABLET | Freq: Once | ORAL | Status: AC
  Administered 2017-10-13: 1000 mg via ORAL

## 2017-10-13 MED ORDER — ACETAMINOPHEN 500 MG PO TABS
ORAL_TABLET | ORAL | Status: AC
  Administered 2017-10-13: 1000 mg via ORAL
  Filled 2017-10-13: qty 2

## 2017-10-13 MED ORDER — PROMETHAZINE HCL 25 MG/ML IJ SOLN: 6.2500 mg | INTRAMUSCULAR | Status: DC | PRN

## 2017-10-13 MED ORDER — LIDOCAINE-EPINEPHRINE 1 %-1:100000 IJ SOLN
INTRAMUSCULAR | Status: DC | PRN
  Administered 2017-10-13: 10 mL

## 2017-10-13 MED ORDER — FLEET ENEMA 7-19 GM/118ML RE ENEM: 1.0000 | ENEMA | Freq: Once | RECTAL | Status: DC | PRN

## 2017-10-13 MED ORDER — LIDOCAINE 2% (20 MG/ML) 5 ML SYRINGE
INTRAMUSCULAR | Status: AC
  Filled 2017-10-13: qty 5

## 2017-10-13 MED ORDER — GABAPENTIN 300 MG PO CAPS
ORAL_CAPSULE | ORAL | Status: AC
  Administered 2017-10-13: 300 mg via ORAL
  Filled 2017-10-13: qty 1

## 2017-10-13 MED ORDER — THROMBIN (RECOMBINANT) 20000 UNITS EX SOLR
CUTANEOUS | Status: AC
  Filled 2017-10-13: qty 20000

## 2017-10-13 MED ORDER — LIDOCAINE 2% (20 MG/ML) 5 ML SYRINGE
INTRAMUSCULAR | Status: DC | PRN
  Administered 2017-10-13: 100 mg via INTRAVENOUS

## 2017-10-13 MED ORDER — GABAPENTIN 300 MG PO CAPS
300.0000 mg | ORAL_CAPSULE | Freq: Three times a day (TID) | ORAL | Status: DC
  Administered 2017-10-13: 300 mg via ORAL
  Filled 2017-10-13: qty 1

## 2017-10-13 MED ORDER — ADULT MULTIVITAMIN W/MINERALS CH: 1.0000 | ORAL_TABLET | Freq: Every day | ORAL | Status: DC

## 2017-10-13 MED ORDER — PROPOFOL 10 MG/ML IV BOLUS
INTRAVENOUS | Status: DC | PRN
  Administered 2017-10-13: 100 mg via INTRAVENOUS

## 2017-10-13 MED ORDER — ZOLPIDEM TARTRATE 5 MG PO TABS: 5.0000 mg | ORAL_TABLET | Freq: Every evening | ORAL | Status: DC | PRN

## 2017-10-13 MED ORDER — METHOCARBAMOL 500 MG PO TABS
ORAL_TABLET | ORAL | Status: AC
  Filled 2017-10-13: qty 1

## 2017-10-13 MED ORDER — ONDANSETRON HCL 4 MG/2ML IJ SOLN
INTRAMUSCULAR | Status: DC | PRN
  Administered 2017-10-13: 4 mg via INTRAVENOUS

## 2017-10-13 MED ORDER — METHOCARBAMOL 1000 MG/10ML IJ SOLN
500.0000 mg | Freq: Four times a day (QID) | INTRAVENOUS | Status: DC | PRN
  Filled 2017-10-13: qty 5

## 2017-10-13 MED ORDER — ACETAMINOPHEN 10 MG/ML IV SOLN: 1000.0000 mg | Freq: Once | INTRAVENOUS | Status: DC | PRN

## 2017-10-13 MED ORDER — ONDANSETRON HCL 4 MG/2ML IJ SOLN: 4.0000 mg | Freq: Four times a day (QID) | INTRAMUSCULAR | Status: DC | PRN

## 2017-10-13 MED ORDER — DEXAMETHASONE SODIUM PHOSPHATE 10 MG/ML IJ SOLN
INTRAMUSCULAR | Status: AC
  Filled 2017-10-13: qty 1

## 2017-10-13 MED ORDER — CHLORHEXIDINE GLUCONATE CLOTH 2 % EX PADS: 6.0000 | MEDICATED_PAD | Freq: Once | CUTANEOUS | Status: DC

## 2017-10-13 MED ORDER — HYDROMORPHONE HCL 1 MG/ML IJ SOLN
0.2500 mg | INTRAMUSCULAR | Status: DC | PRN
  Administered 2017-10-13: 0.25 mg via INTRAVENOUS

## 2017-10-13 MED ORDER — OXYCODONE HCL 5 MG PO TABS
5.0000 mg | ORAL_TABLET | ORAL | Status: DC | PRN
  Administered 2017-10-13 – 2017-10-14 (×6): 5 mg via ORAL
  Filled 2017-10-13 (×5): qty 1

## 2017-10-13 MED ORDER — ACETAMINOPHEN 650 MG RE SUPP: 650.0000 mg | RECTAL | Status: DC | PRN

## 2017-10-13 MED ORDER — ROCURONIUM BROMIDE 10 MG/ML (PF) SYRINGE
PREFILLED_SYRINGE | INTRAVENOUS | Status: AC
  Filled 2017-10-13: qty 10

## 2017-10-13 MED ORDER — FAMOTIDINE IN NACL 20-0.9 MG/50ML-% IV SOLN
20.0000 mg | Freq: Two times a day (BID) | INTRAVENOUS | Status: DC
  Administered 2017-10-13: 20 mg via INTRAVENOUS
  Filled 2017-10-13: qty 50

## 2017-10-13 MED ORDER — ACETAMINOPHEN 500 MG PO TABS
1000.0000 mg | ORAL_TABLET | Freq: Four times a day (QID) | ORAL | Status: DC
  Administered 2017-10-13 – 2017-10-14 (×3): 1000 mg via ORAL
  Filled 2017-10-13 (×3): qty 2

## 2017-10-13 MED ORDER — GABAPENTIN 300 MG PO CAPS
300.0000 mg | ORAL_CAPSULE | Freq: Once | ORAL | Status: AC
  Administered 2017-10-13: 300 mg via ORAL

## 2017-10-13 MED ORDER — BUPIVACAINE HCL (PF) 0.5 % IJ SOLN
INTRAMUSCULAR | Status: DC | PRN
  Administered 2017-10-13: 10 mL

## 2017-10-13 MED ORDER — PROPOFOL 10 MG/ML IV BOLUS
INTRAVENOUS | Status: AC
  Filled 2017-10-13: qty 20

## 2017-10-13 MED ORDER — EPHEDRINE SULFATE-NACL 50-0.9 MG/10ML-% IV SOSY
PREFILLED_SYRINGE | INTRAVENOUS | Status: DC | PRN
  Administered 2017-10-13 (×2): 10 mg via INTRAVENOUS

## 2017-10-13 MED ORDER — MEPERIDINE HCL 50 MG/ML IJ SOLN: 6.2500 mg | INTRAMUSCULAR | Status: DC | PRN

## 2017-10-13 MED ORDER — THROMBIN 20000 UNITS EX SOLR
CUTANEOUS | Status: DC | PRN
  Administered 2017-10-13: 14:00:00

## 2017-10-13 MED ORDER — GABAPENTIN 100 MG PO CAPS: 100.0000 mg | ORAL_CAPSULE | Freq: Four times a day (QID) | ORAL | Status: DC

## 2017-10-13 MED ORDER — CLONIDINE HCL 0.2 MG PO TABS
ORAL_TABLET | ORAL | Status: AC
  Administered 2017-10-13: 0.2 mg via ORAL
  Filled 2017-10-13: qty 1

## 2017-10-13 MED ORDER — PHENOL 1.4 % MT LIQD: 1.0000 | OROMUCOSAL | Status: DC | PRN

## 2017-10-13 MED ORDER — MIDAZOLAM HCL 2 MG/2ML IJ SOLN
INTRAMUSCULAR | Status: AC
  Filled 2017-10-13: qty 2

## 2017-10-13 MED ORDER — TRAZODONE HCL 150 MG PO TABS
150.0000 mg | ORAL_TABLET | Freq: Every day | ORAL | Status: DC
  Administered 2017-10-13: 150 mg via ORAL
  Filled 2017-10-13: qty 1

## 2017-10-13 MED ORDER — FENTANYL CITRATE (PF) 250 MCG/5ML IJ SOLN
INTRAMUSCULAR | Status: AC
  Filled 2017-10-13: qty 5

## 2017-10-13 MED ORDER — HYDROCODONE-ACETAMINOPHEN 7.5-325 MG PO TABS: 1.0000 | ORAL_TABLET | Freq: Once | ORAL | Status: DC | PRN

## 2017-10-13 MED ORDER — SERTRALINE HCL 50 MG PO TABS
75.0000 mg | ORAL_TABLET | Freq: Two times a day (BID) | ORAL | Status: DC
  Administered 2017-10-13: 75 mg via ORAL
  Filled 2017-10-13: qty 2

## 2017-10-13 MED ORDER — METHOCARBAMOL 750 MG PO TABS
750.0000 mg | ORAL_TABLET | Freq: Three times a day (TID) | ORAL | Status: DC
  Administered 2017-10-13 – 2017-10-14 (×2): 750 mg via ORAL
  Filled 2017-10-13 (×2): qty 1

## 2017-10-13 MED ORDER — OXYCODONE HCL 5 MG PO TABS
ORAL_TABLET | ORAL | Status: AC
  Filled 2017-10-13: qty 1

## 2017-10-13 MED ORDER — VITAMIN D 1000 UNITS PO TABS: 2000.0000 [IU] | ORAL_TABLET | Freq: Every day | ORAL | Status: DC

## 2017-10-13 MED ORDER — SUGAMMADEX SODIUM 200 MG/2ML IV SOLN
INTRAVENOUS | Status: DC | PRN
  Administered 2017-10-13: 160 mg via INTRAVENOUS

## 2017-10-13 MED ORDER — LACTATED RINGERS IV SOLN
INTRAVENOUS | Status: DC | PRN
  Administered 2017-10-13 (×2): via INTRAVENOUS

## 2017-10-13 MED ORDER — NAPHAZOLINE-GLYCERIN 0.012-0.2 % OP SOLN
1.0000 [drp] | Freq: Four times a day (QID) | OPHTHALMIC | Status: DC | PRN
  Filled 2017-10-13: qty 15

## 2017-10-13 MED ORDER — ASPIRIN 81 MG PO CHEW: 81.0000 mg | CHEWABLE_TABLET | Freq: Every day | ORAL | Status: DC

## 2017-10-13 MED ORDER — MORPHINE SULFATE (PF) 2 MG/ML IV SOLN: 2.0000 mg | INTRAVENOUS | Status: DC | PRN

## 2017-10-13 MED ORDER — BUPIVACAINE HCL (PF) 0.5 % IJ SOLN
INTRAMUSCULAR | Status: AC
  Filled 2017-10-13: qty 30

## 2017-10-13 MED ORDER — CLONIDINE HCL 0.2 MG PO TABS
0.2000 mg | ORAL_TABLET | Freq: Once | ORAL | Status: AC
  Administered 2017-10-13: 0.2 mg via ORAL

## 2017-10-13 MED ORDER — FENTANYL CITRATE (PF) 250 MCG/5ML IJ SOLN
INTRAMUSCULAR | Status: DC | PRN
  Administered 2017-10-13: 100 ug via INTRAVENOUS

## 2017-10-13 MED ORDER — ONDANSETRON HCL 4 MG PO TABS: 4.0000 mg | ORAL_TABLET | Freq: Four times a day (QID) | ORAL | Status: DC | PRN

## 2017-10-13 MED ORDER — SODIUM CHLORIDE 0.9% FLUSH: 3.0000 mL | Freq: Two times a day (BID) | INTRAVENOUS | Status: DC

## 2017-10-13 MED ORDER — MIDAZOLAM HCL 2 MG/2ML IJ SOLN
INTRAMUSCULAR | Status: DC | PRN
  Administered 2017-10-13: 2 mg via INTRAVENOUS

## 2017-10-13 MED ORDER — ALUM & MAG HYDROXIDE-SIMETH 200-200-20 MG/5ML PO SUSP: 30.0000 mL | Freq: Four times a day (QID) | ORAL | Status: DC | PRN

## 2017-10-13 MED ORDER — KCL IN DEXTROSE-NACL 20-5-0.45 MEQ/L-%-% IV SOLN: INTRAVENOUS | Status: DC

## 2017-10-13 MED ORDER — KETOROLAC TROMETHAMINE 15 MG/ML IJ SOLN
15.0000 mg | Freq: Four times a day (QID) | INTRAMUSCULAR | Status: DC
  Administered 2017-10-13 – 2017-10-14 (×3): 15 mg via INTRAVENOUS
  Filled 2017-10-13 (×3): qty 1

## 2017-10-13 MED ORDER — DOCUSATE SODIUM 100 MG PO CAPS
100.0000 mg | ORAL_CAPSULE | Freq: Two times a day (BID) | ORAL | Status: DC
  Administered 2017-10-13: 100 mg via ORAL
  Filled 2017-10-13: qty 1

## 2017-10-13 MED ORDER — ROCURONIUM BROMIDE 10 MG/ML (PF) SYRINGE
PREFILLED_SYRINGE | INTRAVENOUS | Status: DC | PRN
  Administered 2017-10-13: 20 mg via INTRAVENOUS
  Administered 2017-10-13: 50 mg via INTRAVENOUS
  Administered 2017-10-13: 20 mg via INTRAVENOUS

## 2017-10-13 MED ORDER — LACTATED RINGERS IV SOLN
INTRAVENOUS | Status: DC
  Administered 2017-10-13: 12:00:00 via INTRAVENOUS

## 2017-10-13 MED ORDER — CEFAZOLIN SODIUM-DEXTROSE 2-4 GM/100ML-% IV SOLN
2.0000 g | INTRAVENOUS | Status: AC
  Administered 2017-10-13: 2 g via INTRAVENOUS
  Filled 2017-10-13: qty 100

## 2017-10-13 MED ORDER — CEFAZOLIN SODIUM-DEXTROSE 2-4 GM/100ML-% IV SOLN
2.0000 g | Freq: Three times a day (TID) | INTRAVENOUS | Status: AC
  Administered 2017-10-13 – 2017-10-14 (×2): 2 g via INTRAVENOUS
  Filled 2017-10-13 (×2): qty 100

## 2017-10-13 MED ORDER — 0.9 % SODIUM CHLORIDE (POUR BTL) OPTIME
TOPICAL | Status: DC | PRN
  Administered 2017-10-13: 1000 mL

## 2017-10-13 MED ORDER — SENNOSIDES-DOCUSATE SODIUM 8.6-50 MG PO TABS: 1.0000 | ORAL_TABLET | Freq: Every evening | ORAL | Status: DC | PRN

## 2017-10-13 MED ORDER — SODIUM CHLORIDE 0.9 % IV SOLN: 250.0000 mL | INTRAVENOUS | Status: DC

## 2017-10-13 MED ORDER — METHOCARBAMOL 500 MG PO TABS
500.0000 mg | ORAL_TABLET | Freq: Four times a day (QID) | ORAL | Status: DC | PRN
  Administered 2017-10-13 – 2017-10-14 (×2): 500 mg via ORAL
  Filled 2017-10-13: qty 1

## 2017-10-13 MED ORDER — HEMOSTATIC AGENTS (NO CHARGE) OPTIME
TOPICAL | Status: DC | PRN
  Administered 2017-10-13: 1

## 2017-10-13 MED ORDER — LIDOCAINE-EPINEPHRINE 1 %-1:100000 IJ SOLN
INTRAMUSCULAR | Status: AC
  Filled 2017-10-13: qty 1

## 2017-10-13 MED ORDER — EPHEDRINE 5 MG/ML INJ
INTRAVENOUS | Status: AC
  Filled 2017-10-13: qty 10

## 2017-10-13 MED ORDER — DEXAMETHASONE SODIUM PHOSPHATE 10 MG/ML IJ SOLN
INTRAMUSCULAR | Status: DC | PRN
  Administered 2017-10-13: 10 mg via INTRAVENOUS

## 2017-10-13 MED ORDER — DICLOFENAC SODIUM 3 % TD GEL: 1.0000 "application " | Freq: Every day | TRANSDERMAL | Status: DC | PRN

## 2017-10-13 MED ORDER — ACETAMINOPHEN 325 MG PO TABS: 650.0000 mg | ORAL_TABLET | ORAL | Status: DC | PRN

## 2017-10-13 MED ORDER — HYDROMORPHONE HCL 1 MG/ML IJ SOLN
INTRAMUSCULAR | Status: AC
  Filled 2017-10-13: qty 1

## 2017-10-13 MED ORDER — BISACODYL 10 MG RE SUPP: 10.0000 mg | Freq: Every day | RECTAL | Status: DC | PRN

## 2017-10-13 MED ORDER — SODIUM CHLORIDE 0.9% FLUSH: 3.0000 mL | INTRAVENOUS | Status: DC | PRN

## 2017-10-13 SURGICAL SUPPLY — 70 items
ADH SKN CLS APL DERMABOND .7 (GAUZE/BANDAGES/DRESSINGS) ×1
APL SKNCLS STERI-STRIP NONHPOA (GAUZE/BANDAGES/DRESSINGS) ×1
BASKET BONE COLLECTION (BASKET) ×1 IMPLANT
BENZOIN TINCTURE PRP APPL 2/3 (GAUZE/BANDAGES/DRESSINGS) ×1 IMPLANT
BIT DRILL 12 (MISCELLANEOUS) ×2
BIT DRILL 12X2.3XAVS ANCH-C (MISCELLANEOUS) IMPLANT
BIT DRILL 14X2.5XNS TI ANT (BIT) IMPLANT
BIT DRILL AVIATOR 14 (BIT) ×2
BIT DRILL NEURO 2X3.1 SFT TUCH (MISCELLANEOUS) ×1 IMPLANT
BIT DRL 12X2.3XAVS ANCH-C (MISCELLANEOUS) ×1
BIT DRL 14X2.5XNS TI ANT (BIT) ×1
BLADE ULTRA TIP 2M (BLADE) ×1 IMPLANT
BNDG GAUZE ELAST 4 BULKY (GAUZE/BANDAGES/DRESSINGS) ×2 IMPLANT
BUR BARREL STRAIGHT FLUTE 4.0 (BURR) ×2 IMPLANT
CAGE ANCHOR-C 7X14X16X4 (Cage) ×1 IMPLANT
CANISTER SUCT 3000ML PPV (MISCELLANEOUS) ×2 IMPLANT
CARTRIDGE OIL MAESTRO DRILL (MISCELLANEOUS) ×1 IMPLANT
COVER MAYO STAND STRL (DRAPES) ×2 IMPLANT
DECANTER SPIKE VIAL GLASS SM (MISCELLANEOUS) ×3 IMPLANT
DERMABOND ADVANCED (GAUZE/BANDAGES/DRESSINGS) ×1
DERMABOND ADVANCED .7 DNX12 (GAUZE/BANDAGES/DRESSINGS) ×1 IMPLANT
DIFFUSER DRILL AIR PNEUMATIC (MISCELLANEOUS) ×2 IMPLANT
DRAPE HALF SHEET 40X57 (DRAPES) ×1 IMPLANT
DRAPE LAPAROTOMY 100X72 PEDS (DRAPES) ×2 IMPLANT
DRAPE MICROSCOPE LEICA (MISCELLANEOUS) ×2 IMPLANT
DRILL NEURO 2X3.1 SOFT TOUCH (MISCELLANEOUS) ×2
DRSG OPSITE POSTOP 4X6 (GAUZE/BANDAGES/DRESSINGS) ×1 IMPLANT
DURAPREP 6ML APPLICATOR 50/CS (WOUND CARE) ×2 IMPLANT
ELECT COATED BLADE 2.86 ST (ELECTRODE) ×2 IMPLANT
ELECT REM PT RETURN 9FT ADLT (ELECTROSURGICAL) ×2
ELECTRODE REM PT RTRN 9FT ADLT (ELECTROSURGICAL) ×1 IMPLANT
GAUZE 4X4 16PLY RFD (DISPOSABLE) IMPLANT
GAUZE SPONGE 4X4 12PLY STRL (GAUZE/BANDAGES/DRESSINGS) IMPLANT
GLOVE BIO SURGEON STRL SZ8 (GLOVE) ×3 IMPLANT
GLOVE BIOGEL PI IND STRL 8 (GLOVE) ×1 IMPLANT
GLOVE BIOGEL PI IND STRL 8.5 (GLOVE) ×1 IMPLANT
GLOVE BIOGEL PI INDICATOR 8 (GLOVE) ×5
GLOVE BIOGEL PI INDICATOR 8.5 (GLOVE) ×2
GLOVE ECLIPSE 8.0 STRL XLNG CF (GLOVE) ×6 IMPLANT
GOWN STRL REUS W/ TWL LRG LVL3 (GOWN DISPOSABLE) IMPLANT
GOWN STRL REUS W/ TWL XL LVL3 (GOWN DISPOSABLE) IMPLANT
GOWN STRL REUS W/TWL 2XL LVL3 (GOWN DISPOSABLE) ×1 IMPLANT
GOWN STRL REUS W/TWL LRG LVL3 (GOWN DISPOSABLE)
GOWN STRL REUS W/TWL XL LVL3 (GOWN DISPOSABLE) ×6
HALTER HD/CHIN CERV TRACTION D (MISCELLANEOUS) ×2 IMPLANT
HEMOSTAT POWDER KIT SURGIFOAM (HEMOSTASIS) ×1 IMPLANT
KIT BASIN OR (CUSTOM PROCEDURE TRAY) ×2 IMPLANT
KIT TURNOVER KIT B (KITS) ×2 IMPLANT
NDL HYPO 25X1 1.5 SAFETY (NEEDLE) ×1 IMPLANT
NDL SPNL 22GX3.5 QUINCKE BK (NEEDLE) ×1 IMPLANT
NEEDLE HYPO 25X1 1.5 SAFETY (NEEDLE) ×2 IMPLANT
NEEDLE SPNL 22GX3.5 QUINCKE BK (NEEDLE) ×2 IMPLANT
NS IRRIG 1000ML POUR BTL (IV SOLUTION) ×2 IMPLANT
OIL CARTRIDGE MAESTRO DRILL (MISCELLANEOUS) ×2
PACK LAMINECTOMY NEURO (CUSTOM PROCEDURE TRAY) ×2 IMPLANT
PAD ARMBOARD 7.5X6 YLW CONV (MISCELLANEOUS) ×2 IMPLANT
PEEK SPACER AVS AS 7X14X16X4% (Peek) ×1 IMPLANT
PIN DISTRACTION 14MM (PIN) ×4 IMPLANT
PLATE AVIATOR ASSY 1LVL SZ 12 (Plate) ×1 IMPLANT
RUBBERBAND STERILE (MISCELLANEOUS) ×4 IMPLANT
SCREW AVIATOR VAR SELFTAP 4X14 (Screw) ×4 IMPLANT
SCREW ST 3.5X14MM (Screw) ×2 IMPLANT
SPONGE INTESTINAL PEANUT (DISPOSABLE) ×3 IMPLANT
SPONGE SURGIFOAM ABS GEL 100 (HEMOSTASIS) ×1 IMPLANT
STAPLER SKIN PROX WIDE 3.9 (STAPLE) ×1 IMPLANT
STRIP CLOSURE SKIN 1/2X4 (GAUZE/BANDAGES/DRESSINGS) ×1 IMPLANT
SUT VIC AB 3-0 SH 8-18 (SUTURE) ×3 IMPLANT
TOWEL GREEN STERILE (TOWEL DISPOSABLE) ×2 IMPLANT
TOWEL GREEN STERILE FF (TOWEL DISPOSABLE) ×2 IMPLANT
WATER STERILE IRR 1000ML POUR (IV SOLUTION) ×2 IMPLANT

## 2017-10-13 NOTE — Anesthesia Preprocedure Evaluation (Signed)
Anesthesia Evaluation  Patient identified by MRN, date of birth, ID band Patient awake    Reviewed: Allergy & Precautions, NPO status , Patient's Chart, lab work & pertinent test results  History of Anesthesia Complications (+) PONV  Airway Mallampati: II  TM Distance: >3 FB Neck ROM: Full    Dental no notable dental hx. (+) Teeth Intact, Dental Advisory Given   Pulmonary former smoker,    Pulmonary exam normal breath sounds clear to auscultation       Cardiovascular Exercise Tolerance: Good + Peripheral Vascular Disease  Normal cardiovascular exam Rhythm:Regular Rate:Normal     Neuro/Psych Anxiety    GI/Hepatic negative GI ROS, Neg liver ROS,   Endo/Other  negative endocrine ROS  Renal/GU      Musculoskeletal  (+) Arthritis ,   Abdominal   Peds  Hematology negative hematology ROS (+)   Anesthesia Other Findings   Reproductive/Obstetrics                             Lab Results  Component Value Date   WBC 4.4 10/05/2017   HGB 13.5 10/05/2017   HCT 40.9 10/05/2017   MCV 96.0 10/05/2017   PLT 180 10/05/2017    Anesthesia Physical Anesthesia Plan  ASA: II  Anesthesia Plan: General   Post-op Pain Management:    Induction: Intravenous  PONV Risk Score and Plan: Treatment may vary due to age or medical condition, Ondansetron and Dexamethasone  Airway Management Planned: Oral ETT  Additional Equipment:   Intra-op Plan:   Post-operative Plan: Extubation in OR  Informed Consent: I have reviewed the patients History and Physical, chart, labs and discussed the procedure including the risks, benefits and alternatives for the proposed anesthesia with the patient or authorized representative who has indicated his/her understanding and acceptance.   Dental advisory given  Plan Discussed with: CRNA  Anesthesia Plan Comments:         Anesthesia Quick Evaluation

## 2017-10-13 NOTE — Transfer of Care (Signed)
Immediate Anesthesia Transfer of Care Note  Patient: Leonard [name]  Procedure(s) Performed: Cervical Four-Five Cervical Five-Six Anterior cervical decompression/discectomy/fusion with exploration of Cervical Three-Four, Cervical Six-Seven fusions/possible removal of hardware (N/A Spine Cervical)  Patient Location: PACU  Anesthesia Type:General  Level of Consciousness: lethargic and responds to stimulation  Airway & Oxygen Therapy: Patient Spontanous Breathing and Patient connected to nasal cannula oxygen  Post-op Assessment: Report given to RN and Patient moving all extremities X 4  Post vital signs: Reviewed and stable  Last Vitals:  Vitals Value Taken Time  BP    Temp    Pulse 90 10/13/2017  3:51 PM  Resp 13 10/13/2017  3:51 PM  SpO2 95 % 10/13/2017  3:51 PM  Vitals shown include unvalidated device data.  Last Pain:  Vitals:   10/13/17 1132  TempSrc:   PainSc: 8       Patients Stated Pain Goal: 4 (27/51/70 0174)  Complications: No apparent anesthesia complications

## 2017-10-13 NOTE — Interval H&P Note (Signed)
History and Physical Interval Note:  10/13/2017 12:44 PM  Leonard [name]  has presented today for surgery, with the diagnosis of Cervical stenosis of spinal canal  The various methods of treatment have been discussed with the patient and family. After consideration of risks, benefits and other options for treatment, the patient has consented to  Procedure(s) with comments: Cervical Four-Five Cervical Five-Six Anterior cervical decompression/discectomy/fusion with exploration of Cervical Three-Four, Cervical Six-Seven fusions/possible removal of hardware (N/A) - Cervical Four-Five Cervical Five-Six Anterior cervical decompression/discectomy/fusion with exploration of Cervical Three-Four, Cervical Six-Seven fusions/possible removal of hardware as a surgical intervention .  The patient's history has been reviewed, patient examined, no change in status, stable for surgery.  I have reviewed the patient's chart and labs.  Questions were answered to the patient's satisfaction.     [name] D

## 2017-10-13 NOTE — Op Note (Signed)
10/13/2017  3:49 PM  PATIENT:  Leonard [name]  57 y.o. male  PRE-OPERATIVE DIAGNOSIS:  Cervical stenosis of spinal canal, herniated cervical disc, radiculopathy, cervicalgia C 45 and C 56 levels  POST-OPERATIVE DIAGNOSIS:   Cervical stenosis of spinal canal, herniated cervical disc, radiculopathy, cervicalgia C 45 and C 56 levels  PROCEDURE:  Procedure(s) with comments: Cervical Four-Five Cervical Five-Six Anterior cervical decompression/discectomy/fusion with exploration of Cervical Three-Four, Cervical Six-Seven fusions/possible removal of hardware (N/A) - Cervical Four-Five Cervical Five-Six Anterior cervical decompression/discectomy/fusion with exploration of Cervical Three-Four, Cervical Six-Seven fusions/possible removal of hardware   Anterior cervical decompression and fusion C 56 with Anchor C with autograft and  interfixated plate and screws  Anterior cervical decompression and fusion C 45 with PEEK cage, autograft, anterior cervical plate  SURGEON:  Surgeon(s) and Role:    Erline Levine, MD - Primary  PHYSICIAN ASSISTANT:   ASSISTANTS: Poteat, RN   ANESTHESIA:   general  EBL:  50 mL   BLOOD ADMINISTERED:none  DRAINS: none   LOCAL MEDICATIONS USED:  LIDOCAINE   SPECIMEN:  No Specimen  DISPOSITION OF SPECIMEN:  N/A  COUNTS:  YES  TOURNIQUET:  * No tourniquets in log *  DICTATION: Patient is 57 year old male with left arm pain and weakness with HNP, spondylosis, radiculopathy C 45 and C 56 levels.  He had previously undergone anterior cervical decompression with fusion at C 67 and, at another time at C 34.  PROCEDURE: Patient was brought to operating room and following the smooth and uncomplicated induction of general endotracheal anesthesia her head was placed on a horseshoe head holder he was placed in 5 pounds of Holter traction and his anterior neck was prepped and draped in usual sterile fashion. Previous incision was made on the left side of midline after  infiltrating the skin and subcutaneous tissues with local lidocaine. The platysmal layer was incised and subplatysmal dissection was performed exposing the anterior border sternocleidomastoid muscle. Using blunt dissection the carotid sheath was kept lateral and trachea and esophagus kept medial exposing the anterior cervical spine. There was considerable scarring of soft tissues and using careful dissection, the anterior cervical spine was exposed, including plate at C 34 and at C 67 levels.  A bent spinal needle was placed it was felt to be the C 45 level and this was confirmed on intraoperative x-ray. Longus coli muscles were taken down from the anterior cervical spine using electrocautery and key elevator and self-retaining retractor was placed exposing the C 56 and C 45 levels. The interspaces were incised and a thorough discectomy was performed. Distraction pins were placed. Initially the C 56 level was operated. Uncinate spurs and central spondylitic ridges were drilled down with a high-speed drill. The spinal cord dura and both C6 nerve roots were widely decompressed.  A large amount of herniated disc material and an osteophyte were removed from overlying the left C 6 nerve root.   Hemostasis was assured. After trial sizing a 7 mm peek interbody cage Anchor C  Device was selected and packed with local autograft. This was tamped into position and countersunk appropriately. Interfixated plate and screws were placed, with one 3.5 x 14 mm screw in C 5 and one in C 6.  Attention was the paid to the C 45 level, where similar decompression was performed.  Uncinate spurs and central spondylitic ridges were drilled down with a high-speed drill. The spinal cord dura and both C 5 nerve roots were widely decompressed. Hemostasis  was assured. Several fragments of disc material were removed overlying the left C 5 nerve root.  After trial sizing a 7 mm peek interbody cage was selected and packed with local autograft. This  was tamped into position and countersunk appropriately.Distraction weight was removed. A 12 mm Aviator anterior cervical plate was affixed to the cervical spine with 14 mm variable-angle screws 2 at C4, 2 at C5. All screws were well-positioned and locking mechanisms were engaged. A final X ray was obtained which showed well positioned graft and anterior plate without complicating features. We were not able to visualize the lower implant on X ray.  Soft tissues were inspected and found to be in good repair. The wound was irrigated. The platysma layer was closed with 3-0 Vicryl stitches and the skin was reapproximated with 3-0 Vicryl subcuticular stitches. The wound was dressed with Dermabond. Counts were correct at the end of the case. Patient was extubated and taken to recovery in stable and satisfactory condition.    PLAN OF CARE: Admit to inpatient   PATIENT DISPOSITION:  PACU - hemodynamically stable.   Delay start of Pharmacological VTE agent (>24hrs) due to surgical blood loss or risk of bleeding: yes

## 2017-10-13 NOTE — Anesthesia Postprocedure Evaluation (Signed)
Anesthesia Post Note  Patient: Leonard [name]  Procedure(s) Performed: Cervical Four-Five Cervical Five-Six Anterior cervical decompression/discectomy/fusion with exploration of Cervical Three-Four, Cervical Six-Seven fusions/possible removal of hardware (N/A Spine Cervical)     Patient location during evaluation: PACU Anesthesia Type: General Level of consciousness: awake and alert Pain management: pain level controlled Vital Signs Assessment: post-procedure vital signs reviewed and stable Respiratory status: spontaneous breathing, nonlabored ventilation, respiratory function stable and patient connected to nasal cannula oxygen Cardiovascular status: blood pressure returned to baseline and stable Postop Assessment: no apparent nausea or vomiting Anesthetic complications: no    Last Vitals:  Vitals:   10/13/17 1733 10/13/17 1922  BP: (!) 128/94 121/74  Pulse: 88 85  Resp: 19 18  Temp:  36.6 C  SpO2: 95% 96%    Last Pain:  Vitals:   10/13/17 1949  TempSrc:   PainSc: 5                  Barnet Glasgow

## 2017-10-13 NOTE — Brief Op Note (Signed)
10/13/2017  3:49 PM  PATIENT:  Leonard [name]  57 y.o. male  PRE-OPERATIVE DIAGNOSIS:  Cervical stenosis of spinal canal, herniated cervical disc, radiculopathy, cervicalgia C 45 and C 56 levels  POST-OPERATIVE DIAGNOSIS:   Cervical stenosis of spinal canal, herniated cervical disc, radiculopathy, cervicalgia C 45 and C 56 levels  PROCEDURE:  Procedure(s) with comments: Cervical Four-Five Cervical Five-Six Anterior cervical decompression/discectomy/fusion with exploration of Cervical Three-Four, Cervical Six-Seven fusions/possible removal of hardware (N/A) - Cervical Four-Five Cervical Five-Six Anterior cervical decompression/discectomy/fusion with exploration of Cervical Three-Four, Cervical Six-Seven fusions/possible removal of hardware   Anterior cervical decompression and fusion C 56 with Anchor C with autograft and  interfixated plate and screws  Anterior cervical decompression and fusion C 45 with PEEK cage, autograft, anterior cervical plate  SURGEON:  Surgeon(s) and Role:    Erline Levine, MD - Primary  PHYSICIAN ASSISTANT:   ASSISTANTS: Poteat, RN   ANESTHESIA:   general  EBL:  50 mL   BLOOD ADMINISTERED:none  DRAINS: none   LOCAL MEDICATIONS USED:  LIDOCAINE   SPECIMEN:  No Specimen  DISPOSITION OF SPECIMEN:  N/A  COUNTS:  YES  TOURNIQUET:  * No tourniquets in log *  DICTATION: Patient is 57 year old male with left arm pain and weakness with HNP, spondylosis, radiculopathy C 45 and C 56 levels.  He had previously undergone anterior cervical decompression with fusion at C 67 and, at another time at C 34.  PROCEDURE: Patient was brought to operating room and following the smooth and uncomplicated induction of general endotracheal anesthesia her head was placed on a horseshoe head holder he was placed in 5 pounds of Holter traction and his anterior neck was prepped and draped in usual sterile fashion. Previous incision was made on the left side of midline after  infiltrating the skin and subcutaneous tissues with local lidocaine. The platysmal layer was incised and subplatysmal dissection was performed exposing the anterior border sternocleidomastoid muscle. Using blunt dissection the carotid sheath was kept lateral and trachea and esophagus kept medial exposing the anterior cervical spine. There was considerable scarring of soft tissues and using careful dissection, the anterior cervical spine was exposed, including plate at C 34 and at C 67 levels.  A bent spinal needle was placed it was felt to be the C 45 level and this was confirmed on intraoperative x-ray. Longus coli muscles were taken down from the anterior cervical spine using electrocautery and key elevator and self-retaining retractor was placed exposing the C 56 and C 45 levels. The interspaces were incised and a thorough discectomy was performed. Distraction pins were placed. Initially the C 56 level was operated. Uncinate spurs and central spondylitic ridges were drilled down with a high-speed drill. The spinal cord dura and both C6 nerve roots were widely decompressed.  A large amount of herniated disc material and an osteophyte were removed from overlying the left C 6 nerve root.   Hemostasis was assured. After trial sizing a 7 mm peek interbody cage Anchor C  Device was selected and packed with local autograft. This was tamped into position and countersunk appropriately. Interfixated plate and screws were placed, with one 3.5 x 14 mm screw in C 5 and one in C 6.  Attention was the paid to the C 45 level, where similar decompression was performed.  Uncinate spurs and central spondylitic ridges were drilled down with a high-speed drill. The spinal cord dura and both C 5 nerve roots were widely decompressed. Hemostasis  was assured. Several fragments of disc material were removed overlying the left C 5 nerve root.  After trial sizing a 7 mm peek interbody cage was selected and packed with local autograft. This  was tamped into position and countersunk appropriately.Distraction weight was removed. A 12 mm Aviator anterior cervical plate was affixed to the cervical spine with 14 mm variable-angle screws 2 at C4, 2 at C5. All screws were well-positioned and locking mechanisms were engaged. A final X ray was obtained which showed well positioned graft and anterior plate without complicating features. We were not able to visualize the lower implant on X ray.  Soft tissues were inspected and found to be in good repair. The wound was irrigated. The platysma layer was closed with 3-0 Vicryl stitches and the skin was reapproximated with 3-0 Vicryl subcuticular stitches. The wound was dressed with Dermabond. Counts were correct at the end of the case. Patient was extubated and taken to recovery in stable and satisfactory condition.    PLAN OF CARE: Admit to inpatient   PATIENT DISPOSITION:  PACU - hemodynamically stable.   Delay start of Pharmacological VTE agent (>24hrs) due to surgical blood loss or risk of bleeding: yes

## 2017-10-13 NOTE — Anesthesia Procedure Notes (Signed)
Procedure Name: Intubation Date/Time: 10/13/2017 1:27 PM Performed by: Teressa Lower., CRNA Pre-anesthesia Checklist: Patient identified, Emergency Drugs available, Suction available and Patient being monitored Patient Re-evaluated:Patient Re-evaluated prior to induction Oxygen Delivery Method: Circle system utilized Preoxygenation: Pre-oxygenation with 100% oxygen Induction Type: IV induction Ventilation: Mask ventilation without difficulty Laryngoscope Size: Miller and 2 Grade View: Grade I Tube type: Oral Tube size: 7.5 mm Number of attempts: 1 Airway Equipment and Method: Stylet Placement Confirmation: ETT inserted through vocal cords under direct vision,  positive ETCO2 and breath sounds checked- equal and bilateral Secured at: 23 cm Tube secured with: Tape Dental Injury: Teeth and Oropharynx as per pre-operative assessment

## 2017-10-14 MED FILL — Thrombin (Recombinant) For Soln 20000 Unit: CUTANEOUS | Qty: 1 | Status: AC

## 2017-10-14 NOTE — Evaluation (Signed)
Physical Therapy Evaluation and Discharge Patient Details Name: Leonard [name] MRN: [id number] DOB: [birth year] Today's Date: 10/14/2017   History of Present Illness  Pt is a 57 y/o male now s/p C4-5, 5-6 ACDF.   Clinical Impression  Patient evaluated by Physical Therapy with no further acute PT needs identified. All education has been completed and the patient has no further questions. At the time of PT eval pt was able to perform transfers and ambulation with gross modified independence to supervision for safety. Pt was educated on precautions, car transfer, brace application/wearing schedule, and general safety with mobility progression at home. See below for any follow-up Physical Therapy or equipment needs. PT is signing off. Thank you for this referral.     Follow Up Recommendations No PT follow up;Supervision for mobility/OOB    Equipment Recommendations  None recommended by PT    Recommendations for Other Services       Precautions / Restrictions Precautions Precautions: Cervical Precaution Booklet Issued: Yes (comment) Precaution Comments: reviewed throroughly with pt and family. He was cued for precautions during functional mobility.  Required Braces or Orthoses: Cervical Brace Cervical Brace: Hard collar;At all times Restrictions Weight Bearing Restrictions: No      Mobility  Bed Mobility Overal bed mobility: Modified Independent             General bed mobility comments: HOB flat and rails lowered to simulate home environment. Pt was able to complete without assistance.   Transfers Overall transfer level: Needs assistance Equipment used: None Transfers: Sit to/from Stand Sit to Stand: Supervision;Modified independent (Device/Increase time)         General transfer comment: Supervision intitially however pt progressed to mod I by end of session. Pt was cued to take it slower and not rush.   Ambulation/Gait Ambulation/Gait assistance: Supervision Gait  Distance (Feet): 400 Feet Assistive device: None Gait Pattern/deviations: Step-through pattern;Decreased stride length;Trunk flexed Gait velocity: Decreased Gait velocity interpretation: 1.31 - 2.62 ft/sec, indicative of limited community ambulator General Gait Details: VC's for improved posture. Overall steady but appears guarded at times.   Stairs Stairs: Yes Stairs assistance: Min guard Stair Management: One rail Left;Step to pattern;Forwards Number of Stairs: 10 General stair comments: VC's for sequencing and general safety with stair negotiation. Slow but steady with railing use.   Wheelchair Mobility    Modified Rankin (Stroke Patients Only)       Balance Overall balance assessment: No apparent balance deficits (not formally assessed)                                           Pertinent Vitals/Pain Pain Assessment: Faces Faces Pain Scale: Hurts a little bit Pain Location: neck at incision site Pain Descriptors / Indicators: Guarding;Sore Pain Intervention(s): Limited activity within patient's tolerance;Monitored during session;Repositioned    Home Living Family/patient expects to be discharged to:: Private residence Living Arrangements: Spouse/significant other Available Help at Discharge: Family;Available 24 hours/day Type of Home: House Home Access: Stairs to enter Entrance Stairs-Rails: Left Entrance Stairs-Number of Steps: 3 Home Layout: One level Home Equipment: Hand held shower head;Shower seat - built in      Prior Function Level of Independence: Independent               Hand Dominance   Dominant Hand: Right    Extremity/Trunk Assessment   Upper Extremity Assessment Upper Extremity Assessment: LUE deficits/detail  LUE Deficits / Details: pt reports some minimal residual tingling/numbness in L digits, good grip strength     Lower Extremity Assessment Lower Extremity Assessment: RLE deficits/detail RLE Deficits / Details:  Decreased strength and muscular endurance consistent with pre-op diagnosis.     Cervical / Trunk Assessment Cervical / Trunk Assessment: Other exceptions Cervical / Trunk Exceptions: s/p cervical surgery   Communication   Communication: No difficulties  Cognition Arousal/Alertness: Awake/alert Behavior During Therapy: WFL for tasks assessed/performed Overall Cognitive Status: Within Functional Limits for tasks assessed                                        General Comments      Exercises     Assessment/Plan    PT Assessment Patent does not need any further PT services  PT Problem List         PT Treatment Interventions      PT Goals (Current goals can be found in the Care Plan section)  Acute Rehab PT Goals Patient Stated Goal: return home today  PT Goal Formulation: All assessment and education complete, DC therapy    Frequency     Barriers to discharge        Co-evaluation               AM-PAC PT "6 Clicks" Daily Activity  Outcome Measure Difficulty turning over in bed (including adjusting bedclothes, sheets and blankets)?: None Difficulty moving from lying on back to sitting on the side of the bed? : None Difficulty sitting down on and standing up from a chair with arms (e.g., wheelchair, bedside commode, etc,.)?: A Little Help needed moving to and from a bed to chair (including a wheelchair)?: A Little Help needed walking in hospital room?: A Little Help needed climbing 3-5 steps with a railing? : A Little 6 Click Score: 20    End of Session Equipment Utilized During Treatment: Gait belt;Cervical collar Activity Tolerance: Patient tolerated treatment well Patient left: Other (comment)(In room with OT and family present) Nurse Communication: Mobility status PT Visit Diagnosis: Unsteadiness on feet (R26.81);Pain Pain - part of body: (Neck)    Time: 2500-3704 PT Time Calculation (min) (ACUTE ONLY): 16 min   Charges:   PT  Evaluation $PT Eval Moderate Complexity: 1 Mod          Rolinda Roan, PT, DPT Acute Rehabilitation Services Pager: [phone]   Thelma Comp 10/14/2017, 9:45 AM

## 2017-10-14 NOTE — Discharge Summary (Signed)
Physician Discharge Summary  Patient ID: Leonard [name] MRN: [id number] DOB/AGE: June 05, 1960 57 y.o.  Admit date: 10/13/2017 Discharge date: 10/14/2017  Admission Diagnoses: Cervical stenosis of spinal canal, herniated cervical disc, radiculopathy, cervicalgia C 45 and C 56 levels    Discharge Diagnoses: Cervical stenosis of spinal canal, herniated cervical disc, radiculopathy, cervicalgia C 45 and C 56 levels s/p Cervical Four-Five Cervical Five-Six Anterior cervical decompression/discectomy/fusion with exploration of Cervical Three-Four, Cervical Six-Seven fusions/possible removal of hardware (N/A) - Cervical Four-Five Cervical Five-Six Anterior cervical decompression/discectomy/fusion with exploration of Cervical Three-Four, Cervical Six-Seven fusions/possible removal of hardware   Anterior cervical decompression and fusion C 56 with Anchor C with autograft and interfixated plate and screws  Anterior cervical decompression and fusion C 45 with PEEK cage, autograft, anterior cervical plate     Active Problems:   Cervical stenosis of spinal canal   Discharged Condition: good  Hospital Course: Leonard [name] was admitted for surgery with dx cervical stenosis and radiculopathy. Following uncomplicated ACDF E1-7, E0-8, he recovered nicely and transferred to [facility] for nursing care. He is mobilizing well with resolution of pain.  Consults: None  Significant Diagnostic Studies: radiology: X-Ray: intra-op  Treatments: surgery: Cervical Four-Five Cervical Five-Six Anterior cervical decompression/discectomy/fusion with exploration of Cervical Three-Four, Cervical Six-Seven fusions/possible removal of hardware (N/A) - Cervical Four-Five Cervical Five-Six Anterior cervical decompression/discectomy/fusion with exploration of Cervical Three-Four, Cervical Six-Seven fusions/possible removal of hardware   Anterior cervical decompression and fusion C 56 with Anchor C with autograft and  interfixated plate and screws  Anterior cervical decompression and fusion C 45 with PEEK cage, autograft, anterior cervical plate    Discharge Exam: Blood pressure 121/84, pulse 76, temperature 97.8 F (36.6 C), temperature source Oral, resp. rate 18, height 5\' 9"  (1.753 m), weight 80.7 kg, SpO2 96 %. Alert, reporting no pain. Wife present.Good strength BUE. Incision flat, without erythema or drainage beneath honeycomb and Dermabond. Denies neck/shoulder/arm pain.    Disposition: Discharge to home. Oxycodone and Robaxin for prn home use. Pt verbalizes understanding of d/c instructions. Office f/u Sept 11. He will follow up with Simeon Craft PA-C (Pain Mgt) on Sept 3rd.   Allergies as of 10/14/2017      Reactions   Sulfa Antibiotics Swelling, Rash   Face swells Welts   Zocor [simvastatin] Other (See Comments)   Elevated his liver enzymes      Medication List    STOP taking these medications   ibuprofen 200 MG tablet Commonly known as:  ADVIL,MOTRIN     TAKE these medications   aspirin 81 MG chewable tablet Chew 81 mg by mouth daily.   Diclofenac Sodium 3 % Gel Apply 1 application topically daily as needed (pain). Apply topically daily as directed to painful sites   gabapentin 100 MG capsule Commonly known as:  NEURONTIN Take 100 mg by mouth 4 (four) times daily.   methocarbamol 750 MG tablet Commonly known as:  ROBAXIN Take 750 mg by mouth 3 (three) times daily.   multivitamin with minerals tablet Take 1 tablet by mouth daily.   naphazoline 0.1 % ophthalmic solution Commonly known as:  NAPHCON Place 1 drop into both eyes 4 (four) times daily as needed for eye irritation or allergies.   sertraline 50 MG tablet Commonly known as:  ZOLOFT Take 75 mg by mouth 2 (two) times daily.   traZODone 150 MG tablet Commonly known as:  DESYREL Take 150 mg by mouth at bedtime.   Vitamin D3 2000 units capsule Take 2,000 Units  by mouth daily.         Signed: Peggyann Shoals, MD 10/14/2017, 8:40 AM

## 2017-10-14 NOTE — Care Management Note (Signed)
Case Management Note  Patient Details  Name: Leonard [name] MRN: [id number] Date of Birth: March 26, 1960  Subjective/Objective:                    Action/Plan: No f/u and no DME needs per PT/OT. CM signing off.   Expected Discharge Date:  10/14/17               Expected Discharge Plan:  Home/Self Care  In-House Referral:     Discharge planning Services     Post Acute Care Choice:    Choice offered to:     DME Arranged:    DME Agency:     HH Arranged:    HH Agency:     Status of Service:  Completed, signed off  If discussed at [employer] of Stay Meetings, dates discussed:    Additional Comments:  Pollie Friar, RN 10/14/2017, 10:41 AM

## 2017-10-14 NOTE — Discharge Instructions (Signed)

## 2017-10-14 NOTE — Progress Notes (Signed)
Patient alert and oriented, mae's well, voiding adequate amount of urine, swallowing without difficulty, no c/o pain at time of discharge. Patient discharged home with family. Script and discharged instructions given to patient. Patient and family stated understanding of instructions given. Patient has an appointment with Dr.Stern    

## 2017-10-14 NOTE — Progress Notes (Addendum)
Subjective: Patient reports "I feel great!"  Objective: Vital signs in last 24 hours: Temp:  [97.8 F (36.6 C)-98.6 F (37 C)] 97.8 F (36.6 C) (08/09 0725) Pulse Rate:  [70-90] 76 (08/09 0725) Resp:  [11-20] 18 (08/09 0725) BP: (106-129)/(69-94) 121/84 (08/09 0725) SpO2:  [90 %-97 %] 96 % (08/09 0725) Weight:  [80.7 kg] 80.7 kg (08/08 1122)  Intake/Output from previous day: 08/08 0701 - 08/09 0700 In: 1219.7 [P.O.:50; I.V.:1000; IV Piggyback:169.7] Out: 50 [Blood:50] Intake/Output this shift: No intake/output data recorded.  Alert, reporting no pain. Wife present. Good strength BUE. Incision flat, without erythema or drainage beneath honeycomb and Dermabond. Denies neck/shoulder/arm pain.  Lab Results: No results for input(s): WBC, HGB, HCT, PLT in the last 72 hours. BMET No results for input(s): NA, K, CL, CO2, GLUCOSE, BUN, CREATININE, CALCIUM in the last 72 hours.  Studies/Results: Dg Cervical Spine 2 Or 3 Views  Result Date: 10/13/2017 CLINICAL DATA:  Cervical Four-Five Cervical Five-Six Anterior cervical decompression/discectomy/fusion with exploration of Cervical Three-Four, Cervical Six-Seven fusions/possible removal of hardware (N/A Spine Cervical) ; Done in OR 18. EXAM: CERVICAL SPINE - 2-3 VIEW COMPARISON:  07/21/2017 FINDINGS: Film at 13:47: Patient has anterior fusion of C3-4. Endotracheal tube is in place. A surgical instrument is identified at the disc space of C4-5. Film at 13:49: Surgical instrument overlies the disc space at C4-5. Film at 15:15: Partially imaged anterior plate at F0-2. Detail below C4 is limited. Film of 15 17: Anterior fusion of C4-5 with interbody fusion device. Detail below C5 is limited. IMPRESSION: Intraoperative images during anterior fusion. Electronically Signed   By: Nolon Nations M.D.   On: 10/13/2017 18:09    Assessment/Plan: Improving  LOS: 1 day  Ok to d/c to home per DrStern. Oxycodone and Robaxin for prn home use. Pt verbalizes  understanding of d/c instructions. Office f/u Sept 11.   Leonard [name] 10/14/2017, 7:39 AM  Patient is doing well.  Discharge home today.

## 2017-10-14 NOTE — Evaluation (Signed)
Occupational Therapy Evaluation Patient Details Name: Leonard [name] MRN: [id number] DOB: 04-28-1960 Today's Date: 10/14/2017    History of Present Illness Pt is a 57 y/o male now s/p C4-5, 5-6 ACDF    Clinical Impression   This 57 y/o male presents with the above. At baseline pt is independent with ADLs and functional mobility. Pt performing functional mobility without AD and overall minguard assist. Demonstrates LB ADLs with minguard assist, UB ADLs with minguard-minA. Educated both pt and pt's family regarding cervical precautions, brace management, safety and compensatory strategies for completing ADLs and functional transfers while maintaining precautions with pt/pt's family verbalizing understanding and pt demonstrating good carryover, with intermittent cues provided to maintain during functional tasks. Questions answered throughout. Feel pt is safe to return home from OT standpoint once medically ready. No further acute OT needs identified at this time. Will sign off.     Follow Up Recommendations  No OT follow up;Supervision/Assistance - 24 hour(24hr initially )    Equipment Recommendations  None recommended by OT    Recommendations for Other Services       Precautions / Restrictions Precautions Precautions: Cervical Precaution Booklet Issued: Yes (comment) Precaution Comments: reviewed with pt and pt's family  Required Braces or Orthoses: Cervical Brace Cervical Brace: Hard collar Restrictions Weight Bearing Restrictions: No      Mobility Bed Mobility               General bed mobility comments: OOB with PT start of session   Transfers Overall transfer level: Needs assistance Equipment used: None Transfers: Sit to/from Stand Sit to Stand: Supervision;Min guard         General transfer comment: for general safety, no physical assist required     Balance Overall balance assessment: No apparent balance deficits (not formally assessed)                                          ADL either performed or assessed with clinical judgement   ADL Overall ADL's : Needs assistance/impaired Eating/Feeding: Independent;Sitting   Grooming: Standing;Supervision/safety Grooming Details (indicate cue type and reason): reviewed compensatory strategies for completing oral care  Upper Body Bathing: Sitting;Min guard   Lower Body Bathing: Min guard;Sit to/from stand   Upper Body Dressing : Sitting;Min guard;Minimal assistance Upper Body Dressing Details (indicate cue type and reason): minguard to ensure adherence to cervical precautions, pt with good carryover, minA to adjust cervical brace  Lower Body Dressing: Min guard;Sit to/from stand Lower Body Dressing Details (indicate cue type and reason): pt donning underwear and shorts with minguard for standing balance; pt able to complete figure 4 during task without difficulty  Toilet Transfer: Min guard;Ambulation;Regular Museum/gallery exhibitions officer and Hygiene: Min guard;Sit to/from stand       Functional mobility during ADLs: Min guard General ADL Comments: educated pt, pt's family regarding cervical precautions, safety and compensatory strategies for completing ADLs and functional transfers, pt requiring min cues to maintain during functional tasks but overall with good carry over      Vision         Perception     Praxis      Pertinent Vitals/Pain Pain Assessment: Faces Faces Pain Scale: Hurts a little bit Pain Location: neck at incision site Pain Descriptors / Indicators: Guarding;Sore Pain Intervention(s): Monitored during session     Hand Dominance Right   Extremity/Trunk  Assessment Upper Extremity Assessment Upper Extremity Assessment: Overall WFL for tasks assessed;LUE deficits/detail LUE Deficits / Details: pt reports some minimal residual tingling/numbness in L digits, good grip strength    Lower Extremity Assessment Lower Extremity Assessment:  Defer to PT evaluation   Cervical / Trunk Assessment Cervical / Trunk Assessment: Other exceptions Cervical / Trunk Exceptions: s/p cervical surgery    Communication Communication Communication: No difficulties   Cognition Arousal/Alertness: Awake/alert Behavior During Therapy: WFL for tasks assessed/performed Overall Cognitive Status: Within Functional Limits for tasks assessed                                     General Comments       Exercises     Shoulder Instructions      Home Living Family/patient expects to be discharged to:: Private residence Living Arrangements: Spouse/significant other Available Help at Discharge: Family;Available 24 hours/day Type of Home: House Home Access: Stairs to enter           [employer] Shower/Tub: Occupational psychologist: Standard     Home Equipment: Hand held shower head;Shower seat - built in          Prior Functioning/Environment Level of Independence: Independent                 OT Problem List: Decreased knowledge of precautions;Decreased range of motion      OT Treatment/Interventions:      OT Goals(Current goals can be found in the care plan section) Acute Rehab OT Goals Patient Stated Goal: return home today  OT Goal Formulation: All assessment and education complete, DC therapy  OT Frequency:     Barriers to D/C:            Co-evaluation              AM-PAC PT "6 Clicks" Daily Activity     Outcome Measure Help from another person eating meals?: None Help from another person taking care of personal grooming?: None Help from another person toileting, which includes using toliet, bedpan, or urinal?: None Help from another person bathing (including washing, rinsing, drying)?: A Little Help from another person to put on and taking off regular upper body clothing?: A Little Help from another person to put on and taking off regular lower body clothing?: None 6 Click Score:  22   End of Session Equipment Utilized During Treatment: Cervical collar  Activity Tolerance: Patient tolerated treatment well Patient left: with call bell/phone within reach;with family/visitor present;Other (comment)(sitting EOB )  OT Visit Diagnosis: Other abnormalities of gait and mobility (R26.89)                Time: 8341-9622 OT Time Calculation (min): 14 min Charges:  OT General Charges $OT Visit: 1 Visit OT Evaluation $OT Eval Low Complexity: 1 Low  Lou Cal, OT Pager [phone] 10/14/2017   Raymondo Band 10/14/2017, 8:44 AM

## 2017-10-18 ENCOUNTER — Encounter (HOSPITAL_COMMUNITY): Payer: Self-pay | Admitting: Neurosurgery

## 2017-10-31 ENCOUNTER — Ambulatory Visit
Admission: RE | Admit: 2017-10-31 | Discharge: 2017-10-31 | Disposition: A | Discharge status: Home / Self Care | Payer: PRIVATE HEALTH INSURANCE | Source: Ambulatory Visit | Attending: Gastroenterology | Admitting: Gastroenterology

## 2017-10-31 DIAGNOSIS — K8521 Alcohol induced acute pancreatitis with uninfected necrosis: Secondary | ICD-10-CM

## 2017-10-31 MED ORDER — GADOBENATE DIMEGLUMINE 529 MG/ML IV SOLN
17.0000 mL | Freq: Once | INTRAVENOUS | Status: AC | PRN
  Administered 2017-10-31: 17 mL via INTRAVENOUS

## 2017-12-15 ENCOUNTER — Ambulatory Visit (INDEPENDENT_AMBULATORY_CARE_PROVIDER_SITE_OTHER): Payer: PRIVATE HEALTH INSURANCE | Admitting: Vascular Surgery

## 2017-12-15 ENCOUNTER — Encounter: Payer: Self-pay | Admitting: Vascular Surgery

## 2017-12-15 ENCOUNTER — Other Ambulatory Visit: Payer: Self-pay

## 2017-12-15 VITALS — BP 117/77 | HR 70 | Temp 97.7°F | Resp 16 | Ht 69.0 in | Wt 188.0 lb

## 2017-12-15 DIAGNOSIS — I6523 Occlusion and stenosis of bilateral carotid arteries: Secondary | ICD-10-CM | POA: Diagnosis not present

## 2017-12-15 NOTE — Progress Notes (Signed)
Patient is a 57 year old male who returns for follow-up today.  He was last seen March 2019.  He was originally seen March 2019 for evaluation of a left internal carotid artery dissection that apparently occurred while riding a stationary bike.  He has been asymptomatic.  There was concern home he saw him in March of this year of a possible filling deficit or thrombus in his right internal carotid artery.  We were going to schedule him for a carotid angiogram to fully evaluate this.  However he ended up having several other medical problems and was admitted for about a month with pancreatitis and ended up with a 4 level cervical fusion and unrelated episode in August 2019.  He never had the arteriogram performed.  He denies any symptoms of TIA amaurosis or stroke.  He does take aspirin daily.  Current Outpatient Medications on File Prior to Visit  Medication Sig Dispense Refill  . aspirin 81 MG chewable tablet Chew 81 mg by mouth daily.     . Cholecalciferol (VITAMIN D3) 2000 units capsule Take 2,000 Units by mouth daily.     Marland Kitchen gabapentin (NEURONTIN) 100 MG capsule Take 100 mg by mouth 4 (four) times daily.    . methocarbamol (ROBAXIN) 750 MG tablet Take 750 mg by mouth 3 (three) times daily.    . Multiple Vitamins-Minerals (MULTIVITAMIN WITH MINERALS) tablet Take 1 tablet by mouth daily.    . naphazoline (NAPHCON) 0.1 % ophthalmic solution Place 1 drop into both eyes 4 (four) times daily as needed for eye irritation or allergies.     Marland Kitchen sertraline (ZOLOFT) 50 MG tablet Take 75 mg by mouth 2 (two) times daily.     . traZODone (DESYREL) 150 MG tablet Take 150 mg by mouth at bedtime.     No current facility-administered medications on file prior to visit.    Review of systems: He denies shortness of breath.  He denies chest pain.  He does complain about erectile dysfunction.  Past Medical History:  Diagnosis Date  . Abnormal EKG    anterolateral T wave inversion  . Anxiety   . Arthritis    "lower  back, maybe some in my neck" (08/05/2017)  . 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 Chronic lower back pain    spondylolisthesis,spondylosis,DDD,stenosis  . Depression   . Dizziness   . Hyperlipidemia    was on meds but has been off x 5 months d/t elevated liver enzymes  . Hyperlipidemia    statin intolerant because of elevated liver function tests  . Insomnia    takes Trazodone nightly  . Numbness and tingling of both legs   . Opiate addiction ([place])   . Pancreatitis 08/03/2017  . PONV (postoperative nausea and vomiting)   . Skin cancer    "forehead; right posterior neck"  . Weakness    pain and occasionally tingling in right arm and leg    Past Surgical History:  Procedure Laterality Date  . ANTERIOR CERVICAL DECOMP/DISCECTOMY FUSION  2003, 2007   x 2  . ANTERIOR CERVICAL DECOMP/DISCECTOMY FUSION N/A 10/13/2017   Procedure: Cervical Four-Five Cervical Five-Six Anterior cervical decompression/discectomy/fusion with exploration of Cervical Three-Four, Cervical Six-Seven fusions/possible removal of hardware;  Surgeon: Erline Levine, MD;  Location: [facility];  Service: Neurosurgery;  Laterality: N/A;  Cervical Four-Five Cervical Five-Six Anterior cervical decompression/discectomy/fusion with exploration of Cervical  . APPENDECTOMY  as a child  . BACK SURGERY    . COLONOSCOPY    .  KNEE ARTHROSCOPY Right 2011  . LUMBAR LAMINECTOMY  2014   L4-5  . MOHS SURGERY     "forehead; right posterior neck"  . POSTERIOR LUMBAR FUSION  2015  . radial nerve impingement Right 2009   "near elbor"  . TIBIA FRACTURE SURGERY Right as a child   compound fracture    Physical exam:  Vitals:   12/15/17 1046  BP: 117/77  Pulse: 70  Resp: 16  Temp: 97.7 F (36.5 C)  TempSrc: Oral  SpO2: 93%  Weight: 188 lb (85.3 kg)  Height: 5\' 9"  (1.753 m)    Neck: No carotid bruits  Chest: Clear to auscultation bilaterally  Cardiac: Regular in rhythm without murmur  Neuro: Symmetric upper  extremity lower extremity motor strength 5/5 no facial asymmetry  Data: He has had no further imaging of his carotid arteries since his CT Angio of March 2019.  Assessment: Prior left internal carotid artery dissection which showed no flow limitation on CT Angio March 2019.  Filling defect was probably artifact on the right side.  He has had no symptoms in the last 7 months.  Plan: The patient will have a follow-up carotid duplex scan in 1 year and see our nurse practitioner.  If he has no significant carotid occlusive disease at that point he can probably is follow-up on an as-needed basis.  Patient did inquire today on whether or not his carotid artery problems would prevent him from using an erectile dysfunction medication.  I did not see any contraindication to this from his carotid standpoint.  Ruta Hinds, MD [facility] Office: [phone] Pager: [phone]

## 2018-04-21 ENCOUNTER — Other Ambulatory Visit: Payer: Self-pay | Admitting: Neurosurgery

## 2018-04-21 DIAGNOSIS — M5416 Radiculopathy, lumbar region: Secondary | ICD-10-CM

## 2018-04-23 ENCOUNTER — Ambulatory Visit
Admission: RE | Admit: 2018-04-23 | Discharge: 2018-04-23 | Disposition: A | Discharge status: Home / Self Care | Payer: PRIVATE HEALTH INSURANCE | Source: Ambulatory Visit | Attending: Neurosurgery | Admitting: Neurosurgery

## 2018-04-23 DIAGNOSIS — M5416 Radiculopathy, lumbar region: Secondary | ICD-10-CM

## 2018-04-23 MED ORDER — GADOBENATE DIMEGLUMINE 529 MG/ML IV SOLN
17.0000 mL | Freq: Once | INTRAVENOUS | Status: AC | PRN
  Administered 2018-04-23: 17 mL via INTRAVENOUS

## 2018-05-09 ENCOUNTER — Other Ambulatory Visit: Payer: Self-pay | Admitting: Neurosurgery

## 2018-06-06 NOTE — Pre-Procedure Instructions (Signed)
Leonard [name]  06/06/2018      Your procedure is scheduled on Thursday, 06/15/2018.  Report to [facility] Admitting-Entrance A at 5:30  A.M.  Call this number if you have problems the morning of surgery:  [phone]     Remember:  Do not eat or drink after midnight Wednesday 06/14/18.   Take these medicines the morning of surgery with A SIP OF WATER:  Gabapentin  Claritin  Crestor  Zoloft    May take if needed: Robaxin, Flonase Nasal Spray, eye drops   7 days prior to surgery STOP taking any Aspirin (unless otherwise instructed by your surgeon), Aleve, Naproxen, Ibuprofen,  Motrin, Advil, Goody's, BC's, all herbal medications, fish oil, and all vitamins.  May use Tylenol if needed.   Do not wear jewelry  Do not wear lotions, powders, or colognes, or deodorant.  Men may shave face and neck.  Do not bring valuables to the hospital.  [facility] is not responsible for any belongings or valuables.  Contacts, dentures or bridgework may not be worn into surgery.  Leave your suitcase in the car.  After surgery it may be brought to your room.  For patients admitted to the hospital, discharge time will be determined by your treatment team.  Patients discharged the day of surgery will not be allowed to drive home.    [facility]- Preparing For Surgery  Before surgery, you can play an important role. Because skin is not sterile, your skin needs to be as free of germs as possible. You can reduce the number of germs on your skin by washing with CHG (chlorahexidine gluconate) Soap before surgery.  CHG is an antiseptic cleaner which kills germs and bonds with the skin to continue killing germs even after washing.    Oral Hygiene is also important to reduce your risk of infection.  Remember - BRUSH YOUR TEETH THE MORNING OF SURGERY WITH YOUR REGULAR TOOTHPASTE  Please do not use if you have an allergy to CHG or antibacterial soaps. If your skin becomes reddened/irritated stop  using the CHG.  Do not shave (including legs and underarms) for at least 48 hours prior to first CHG shower. It is OK to shave your face.  Please follow these instructions carefully.   1. Shower the NIGHT BEFORE SURGERY and the MORNING OF SURGERY with CHG.   2. If you chose to wash your hair, wash your hair first as usual with your normal shampoo.  3. After you shampoo, rinse your hair and body thoroughly to remove the shampoo.  4. Use CHG as you would any other liquid soap. You can apply CHG directly to the skin and wash gently with a scrungie or a clean washcloth.   5. Apply the CHG Soap to your body ONLY FROM THE NECK DOWN.  Do not use on open wounds or open sores. Avoid contact with your eyes, ears, mouth and genitals (private parts). Wash Face and genitals (private parts)  with your normal soap.  6. Wash thoroughly, paying special attention to the area where your surgery will be performed.  7. Thoroughly rinse your body with warm water from the neck down.  8. DO NOT shower/wash with your normal soap after using and rinsing off the CHG Soap.  9. Pat yourself dry with a CLEAN TOWEL.  10. Wear CLEAN PAJAMAS to bed the night before surgery, wear comfortable clothes the morning of surgery  11. Place CLEAN SHEETS on your bed the night of  your first shower and DO NOT SLEEP WITH PETS.   Day of Surgery:   Make sure to shower as directed above. Do not apply any deodorants/lotions.  Please wear clean clothes to the hospital/surgery center.   Remember to brush your teeth WITH YOUR REGULAR TOOTHPASTE.    Please read over the following fact sheets that you were given. Pain Booklet, Coughing and Deep Breathing, MRSA Information and Surgical Site Infection Prevention

## 2018-06-07 ENCOUNTER — Encounter (HOSPITAL_COMMUNITY): Payer: Self-pay | Admitting: Vascular Surgery

## 2018-06-07 ENCOUNTER — Other Ambulatory Visit: Payer: Self-pay

## 2018-06-07 ENCOUNTER — Encounter (HOSPITAL_COMMUNITY)
Admission: RE | Admit: 2018-06-07 | Discharge: 2018-06-07 | Disposition: A | Discharge status: Home / Self Care | Payer: PRIVATE HEALTH INSURANCE | Source: Ambulatory Visit | Attending: Neurosurgery | Admitting: Neurosurgery

## 2018-06-07 ENCOUNTER — Encounter (HOSPITAL_COMMUNITY): Payer: Self-pay

## 2018-06-07 ENCOUNTER — Inpatient Hospital Stay (HOSPITAL_COMMUNITY): Admission: RE | Admit: 2018-06-07 | Payer: Self-pay | Source: Ambulatory Visit

## 2018-06-07 DIAGNOSIS — Z01812 Encounter for preprocedural laboratory examination: Secondary | ICD-10-CM | POA: Diagnosis not present

## 2018-06-07 LAB — COMPREHENSIVE METABOLIC PANEL
ALT: 29 U/L (ref 0–44)
AST: 24 U/L (ref 15–41)
Albumin: 4.3 g/dL (ref 3.5–5.0)
Alkaline Phosphatase: 31 U/L — ABNORMAL LOW (ref 38–126)
Anion gap: 5 (ref 5–15)
BUN: 14 mg/dL (ref 6–20)
CO2: 24 mmol/L (ref 22–32)
Calcium: 9.5 mg/dL (ref 8.9–10.3)
Chloride: 108 mmol/L (ref 98–111)
Creatinine, Ser: 1.06 mg/dL (ref 0.61–1.24)
GFR calc Af Amer: >60 mL/min (ref >60)
GFR calc non Af Amer: >60 mL/min (ref >60)
Glucose, Bld: 123 mg/dL — ABNORMAL HIGH (ref 70–99)
Potassium: 4.8 mmol/L (ref 3.5–5.1)
Sodium: 137 mmol/L (ref 135–145)
Total Bilirubin: 0.7 mg/dL (ref 0.3–1.2)
Total Protein: 6.7 g/dL (ref 6.5–8.1)

## 2018-06-07 LAB — CBC
HCT: 41.2 % (ref 39.0–52.0)
Hemoglobin: 13.3 g/dL (ref 13.0–17.0)
MCH: 30.2 pg (ref 26.0–34.0)
MCHC: 32.3 g/dL (ref 30.0–36.0)
MCV: 93.4 fL (ref 80.0–100.0)
Platelets: 148 10*3/uL — ABNORMAL LOW (ref 150–400)
RBC: 4.41 MIL/uL (ref 4.22–5.81)
RDW: 12.1 % (ref 11.5–15.5)
WBC: 4.9 10*3/uL (ref 4.0–10.5)
nRBC: 0 % (ref 0.0–0.2)

## 2018-06-07 LAB — TYPE AND SCREEN
ABO/RH(D): A POS
Antibody Screen: NEGATIVE

## 2018-06-07 LAB — SURGICAL PCR SCREEN
MRSA, PCR: NEGATIVE
Staphylococcus aureus: POSITIVE — AB

## 2018-06-07 NOTE — Progress Notes (Addendum)
PCP - Ehinger  Cardiologist - Berry  Chest x-ray - NA  EKG - 08-05-17 (E)  Stress Test - 09/06/13  ECHO - 09/06/13  Cardiac Cath - Denies  AICD-NA PM-NA LOOP-NA  Sleep Study - Denies CPAP - Denies  LABS-CBC,CMP,T/S  ASA-81 mg. Stopped 06/08/18  HA1C-NA Fasting Blood Sugar -  Checks Blood Sugar _____ times a day  Anesthesia-N  Pt denies having chest pain, sob, or fever at this time. All instructions explained to the pt, with a verbal understanding of the material. Pt agrees to go over the instructions while at home for a better understanding. The opportunity to ask questions was provided.

## 2018-06-07 NOTE — Progress Notes (Signed)

## 2018-06-07 NOTE — Progress Notes (Signed)
Patient positive for MSSA.  Prescription called in at [facility]. Left VM for patient about positive test result and to pick up Rx when ready from pharmacy.

## 2018-06-15 ENCOUNTER — Encounter (HOSPITAL_COMMUNITY): Payer: Self-pay

## 2018-06-15 ENCOUNTER — Encounter (HOSPITAL_COMMUNITY): Admission: RE | Payer: Self-pay | Source: Home / Self Care

## 2018-06-15 ENCOUNTER — Inpatient Hospital Stay (HOSPITAL_COMMUNITY): Admission: RE | Admit: 2018-06-15 | Payer: PRIVATE HEALTH INSURANCE | Source: Home / Self Care | Admitting: Neurosurgery

## 2018-06-15 ENCOUNTER — Ambulatory Visit: Payer: Self-pay | Admitting: Vascular Surgery

## 2018-06-15 SURGERY — ANTERIOR LATERAL LUMBAR FUSION 1 LEVEL
Anesthesia: General | Laterality: Right

## 2018-06-16 ENCOUNTER — Ambulatory Visit: Payer: Self-pay | Admitting: Family

## 2018-06-16 ENCOUNTER — Encounter (HOSPITAL_COMMUNITY): Payer: Self-pay

## 2018-08-24 ENCOUNTER — Other Ambulatory Visit: Payer: Self-pay | Admitting: Neurosurgery

## 2018-09-13 ENCOUNTER — Telehealth: Payer: Self-pay | Admitting: Vascular Surgery

## 2018-09-13 NOTE — Telephone Encounter (Signed)
LMOM to call the office and R/S his appt from 06/15/18

## 2018-09-14 ENCOUNTER — Telehealth: Payer: Self-pay | Admitting: Vascular Surgery

## 2018-09-14 NOTE — Telephone Encounter (Signed)
LMOM to call the office and R/S his 06/16/18 office visit

## 2018-09-25 NOTE — Progress Notes (Signed)
PCP -  Cardiologist -  Chest x-ray -  EKG -   Stress Test -  ECHO -   Cardiac Cath -   Sleep Study -  CPAP -   Fasting Blood Sugar -  Checks Blood Sugar _____ times a day  Blood Thinner Instructions: Aspirin Instructions:  Anesthesia review:   Patient denies shortness of breath, fever, cough and chest pain at PAT appointment  Patient verbalized understanding of instructions that were given to them at the PAT appointment. Patient was also instructed that they will need to review over the PAT instructions again at home before surgery.

## 2018-09-25 NOTE — Pre-Procedure Instructions (Addendum)
Leonard [name]  09/25/2018      [id number] [name], [place] AT [facility] OF [address] [name] Phone: [phone] Fax: [phone]    Your procedure is scheduled on September 29, 2018.  Report to [facility] Entrance "A" at 1230 PM.  Call this number if you have problems the morning of surgery:  [phone]   Call [phone] if you have any questions prior to your surgery date Monday-Friday 8am-4pm    Remember:  Do not eat or drink after midnight.    Take these medicines the morning of surgery with A SIP OF WATER  Flonase nasal spray Methocarbamol (robaxin) Eye drops Pregabalin (lyrica) Sertraline (zoloft)  Follow your surgeon's instructions on when to hold/resume aspirin.  If no instructions were given call the office to determine how they would like to you take aspirin   Beginning now, STOP taking any Aleve, Naproxen, Ibuprofen, Motrin, Advil, Goody's, BC's, all herbal medications, fish oil, and all vitamins   Day of surgery:  Do not wear jewelry.  Do not wear lotions, powders, or colognes, or deodorant.  Men may shave face and neck.  Do not bring valuables to the hospital.   [facility] is not responsible for any belongings or valuables.  Contacts, dentures or bridgework may not be worn into surgery.  Leave your suitcase in the car.  After surgery it may be brought to your room.  For patients admitted to the hospital, discharge time will be determined by your treatment team.  IF you are a smoker, DO NOT Smoke 24 hours prior to surgery   IF you wear a CPAP at night please bring your mask, tubing, and machine the morning of surgery    Remember that you must have someone to transport you home after your surgery, and remain with you for 24 hours if you are discharged the same day.  Patients discharged the day of surgery will not be allowed to drive home.   [facility]- Preparing For  Surgery  Before surgery, you can play an important role. Because skin is not sterile, your skin needs to be as free of germs as possible. You can reduce the number of germs on your skin by washing with CHG (chlorahexidine gluconate) Soap before surgery.  CHG is an antiseptic cleaner which kills germs and bonds with the skin to continue killing germs even after washing.    Oral Hygiene is also important to reduce your risk of infection.  Remember - BRUSH YOUR TEETH THE MORNING OF SURGERY WITH YOUR REGULAR TOOTHPASTE  Please do not use if you have an allergy to CHG or antibacterial soaps. If your skin becomes reddened/irritated stop using the CHG.  Do not shave (including legs and underarms) for at least 48 hours prior to first CHG shower. It is OK to shave your face.  Please follow these instructions carefully.   1. Shower the NIGHT BEFORE SURGERY and the MORNING OF SURGERY with CHG.   2. If you chose to wash your hair, wash your hair first as usual with your normal shampoo.  3. After you shampoo, rinse your hair and body thoroughly to remove the shampoo.  4. Use CHG as you would any other liquid soap. You can apply CHG directly to the skin and wash gently with a scrungie or a clean washcloth.   5. Apply the CHG Soap to your body ONLY FROM  THE NECK DOWN.  Do not use on open wounds or open sores. Avoid contact with your eyes, ears, mouth and genitals (private parts). Wash Face and genitals (private parts)  with your normal soap.  6. Wash thoroughly, paying special attention to the area where your surgery will be performed.  7. Thoroughly rinse your body with warm water from the neck down.  8. DO NOT shower/wash with your normal soap after using and rinsing off the CHG Soap.  9. Pat yourself dry with a CLEAN TOWEL.  10. Wear CLEAN PAJAMAS to bed the night before surgery, wear comfortable clothes the morning of surgery  11. Place CLEAN SHEETS on your bed the night of your first shower and  DO NOT SLEEP WITH PETS.  Day of Surgery:  Do not apply any deodorants/lotions.  Please wear clean clothes to the hospital/surgery center.   Remember to brush your teeth WITH YOUR REGULAR TOOTHPASTE.  Please read over the following fact sheets that you were given.

## 2018-09-26 ENCOUNTER — Other Ambulatory Visit: Payer: Self-pay

## 2018-09-26 ENCOUNTER — Encounter (HOSPITAL_COMMUNITY): Payer: Self-pay

## 2018-09-26 ENCOUNTER — Other Ambulatory Visit (HOSPITAL_COMMUNITY)
Admission: RE | Admit: 2018-09-26 | Discharge: 2018-09-26 | Disposition: A | Discharge status: Home / Self Care | Payer: PRIVATE HEALTH INSURANCE | Source: Ambulatory Visit | Attending: Neurosurgery | Admitting: Neurosurgery

## 2018-09-26 ENCOUNTER — Encounter (HOSPITAL_COMMUNITY)
Admission: RE | Admit: 2018-09-26 | Discharge: 2018-09-26 | Disposition: A | Discharge status: Home / Self Care | Payer: PRIVATE HEALTH INSURANCE | Source: Ambulatory Visit | Attending: Neurosurgery | Admitting: Neurosurgery

## 2018-09-26 DIAGNOSIS — Z79899 Other long term (current) drug therapy: Secondary | ICD-10-CM | POA: Insufficient documentation

## 2018-09-26 DIAGNOSIS — G4700 Insomnia, unspecified: Secondary | ICD-10-CM | POA: Diagnosis not present

## 2018-09-26 DIAGNOSIS — Z1159 Encounter for screening for other viral diseases: Secondary | ICD-10-CM | POA: Insufficient documentation

## 2018-09-26 DIAGNOSIS — F419 Anxiety disorder, unspecified: Secondary | ICD-10-CM | POA: Insufficient documentation

## 2018-09-26 DIAGNOSIS — E785 Hyperlipidemia, unspecified: Secondary | ICD-10-CM | POA: Insufficient documentation

## 2018-09-26 DIAGNOSIS — K219 Gastro-esophageal reflux disease without esophagitis: Secondary | ICD-10-CM | POA: Insufficient documentation

## 2018-09-26 DIAGNOSIS — Z01818 Encounter for other preprocedural examination: Secondary | ICD-10-CM | POA: Insufficient documentation

## 2018-09-26 DIAGNOSIS — Z7982 Long term (current) use of aspirin: Secondary | ICD-10-CM | POA: Diagnosis not present

## 2018-09-26 DIAGNOSIS — Z87891 Personal history of nicotine dependence: Secondary | ICD-10-CM | POA: Insufficient documentation

## 2018-09-26 DIAGNOSIS — Z85828 Personal history of other malignant neoplasm of skin: Secondary | ICD-10-CM | POA: Insufficient documentation

## 2018-09-26 DIAGNOSIS — F329 Major depressive disorder, single episode, unspecified: Secondary | ICD-10-CM | POA: Diagnosis not present

## 2018-09-26 DIAGNOSIS — M4120 Other idiopathic scoliosis, site unspecified: Secondary | ICD-10-CM | POA: Insufficient documentation

## 2018-09-26 DIAGNOSIS — Z7951 Long term (current) use of inhaled steroids: Secondary | ICD-10-CM | POA: Insufficient documentation

## 2018-09-26 DIAGNOSIS — Z981 Arthrodesis status: Secondary | ICD-10-CM | POA: Diagnosis not present

## 2018-09-26 HISTORY — DX: Aneurysm of iliac artery: I72.3

## 2018-09-26 LAB — CBC
HCT: 42.9 % (ref 39.0–52.0)
Hemoglobin: 14.4 g/dL (ref 13.0–17.0)
MCH: 31.2 pg (ref 26.0–34.0)
MCHC: 33.6 g/dL (ref 30.0–36.0)
MCV: 93.1 fL (ref 80.0–100.0)
Platelets: 143 10*3/uL — ABNORMAL LOW (ref 150–400)
RBC: 4.61 MIL/uL (ref 4.22–5.81)
RDW: 12.6 % (ref 11.5–15.5)
WBC: 4.5 10*3/uL (ref 4.0–10.5)
nRBC: 0 % (ref 0.0–0.2)

## 2018-09-26 LAB — TYPE AND SCREEN
ABO/RH(D): A POS
Antibody Screen: NEGATIVE

## 2018-09-26 LAB — BASIC METABOLIC PANEL
Anion gap: 10 (ref 5–15)
BUN: 13 mg/dL (ref 6–20)
CO2: 26 mmol/L (ref 22–32)
Calcium: 9.5 mg/dL (ref 8.9–10.3)
Chloride: 103 mmol/L (ref 98–111)
Creatinine, Ser: 1.28 mg/dL — ABNORMAL HIGH (ref 0.61–1.24)
GFR calc Af Amer: >60 mL/min (ref >60)
GFR calc non Af Amer: >60 mL/min (ref >60)
Glucose, Bld: 128 mg/dL — ABNORMAL HIGH (ref 70–99)
Potassium: 4.5 mmol/L (ref 3.5–5.1)
Sodium: 139 mmol/L (ref 135–145)

## 2018-09-26 LAB — SURGICAL PCR SCREEN
MRSA, PCR: NEGATIVE
Staphylococcus aureus: POSITIVE — AB

## 2018-09-26 LAB — SARS CORONAVIRUS 2 (TAT 6-24 HRS): SARS Coronavirus 2: NEGATIVE

## 2018-09-26 NOTE — Progress Notes (Addendum)
PCP -  Dr. Gaynelle Arabian Cardiologist - Dr. Anson Crofts longer required to see him regularly  Chest x-ray - n/a EKG - 09/26/2018 at PAT appt  Stress Test - 09/06/13 in EPIC ECHO - 09/06/13 in EPIC   Cardiac Cath -  denies  Sleep Study - denies CPAP - denies  Fasting Blood Sugar - n/a Checks Blood Sugar _____ times a day-n/a  Blood Thinner Instructions: n/a Aspirin Instructions: Aspirin 81 mg- on hold since 09/21/2018  Anesthesia review: Yes, low platlets  Patient denies shortness of breath, fever, cough and chest pain at PAT appointment  Patient verbalized understanding of instructions that were given to them at the PAT appointment. Patient was also instructed that they will need to review over the PAT instructions again at home before surgery  Patient's surgical PCR positive for Staph, will treat day of surgery with [facility] per policy.  Coronavirus Screening  Have you experienced the following symptoms:  Cough yes/no: No Fever (>100.61F)  yes/no: No Runny nose yes/no: No Sore throat yes/no: No Difficulty breathing/shortness of breath  yes/no: No  Have you or a family member traveled in the last 14 days and where? yes/no: No   If the patient indicates "YES" to the above questions, their PAT will be rescheduled to limit the exposure to others and, the surgeon will be notified. THE PATIENT WILL NEED TO BE ASYMPTOMATIC FOR 14 DAYS.   If the patient is not experiencing any of these symptoms, the PAT nurse will instruct them to NOT bring anyone with them to their appointment since they may have these symptoms or traveled as well.   Please remind your patients and families that hospital visitation restrictions are in effect and the importance of the restrictions.

## 2018-09-26 NOTE — Progress Notes (Signed)
Anesthesia Chart Review:  Case: [id number] Date/Time: 09/29/18 1418   Procedure: Right Lumbar 3-4 Anterolateral lumbar interbody fusion with lateral plate (Right ) - Right Lumbar 3-4 Anterolateral lumbar interbody fusion with lateral plate   Anesthesia type: General   Pre-op diagnosis: Scoliosis, Kyphoscoliosis, Idiopathic   Location: [facility] OR ROOM 21 / [facility]   Surgeon: Erline Levine, MD      DISCUSSION: Patient is a 58 year old male scheduled for the above procedure.  History includes former smoker, post-operative N/V, anxiety, depression, HLD (intolerant to statins due to elevated LFTs), GERD, dizziness, chronic back pain with LE paresthesias, back surgeries (laminectomy ~ 2014; L4-5 PLIF 09/10/13), neck surgeries (C3-4 ACF 12/29/01; C4-5 ACDF 10/13/17), pancreatitis (07/2017), left carotid artery dissection (~ 2018), bilateral iliac aneurysms (stable 2.2 cm, 07/2017), skin cancer (s/p MOHS), insomnia.  Prior admission in 08/2013 for ETOH/opiate dependency. He denied current ETOH or illicit drug use. Remote history of cocaine dependence (> 30 years ago).  Non-ischemic stress test in 2015. Vascular follow-up due ~ 12/2018. Labs and EKG appear acceptable for OR.  09/26/2018 presurgical COVID test is still pending. If result negative, and otherwise no acute changes then I would anticipate that he can proceed as planned.   VS: BP 140/87   Pulse 84   Temp 36.5 C   Resp 20   Ht 5' 9.5" (1.765 m)   Wt 88 kg   SpO2 96%   BMI 28.24 kg/m    PROVIDERS: Gaynelle Arabian, MD is PCP - Ruta Hinds, MD is vascular surgeon. Last visit 12/15/17. One year follow-up recommended and if no significant changes on follow-up carotid imaging then as needed follow-up planned. His notes mention that 2 cm iliac aneurysms are followed by his PCP. - Patient is not routinely followed by cardiology, but was referred to Quay Burow, MD on 09/05/2013 for preoperative evaluation due to EKG changes (new inferolateral T wave  inversion).  He had a nonischemic stress test with normal EF.   LABS: Labs reviewed: Acceptable for surgery. PLT 143K, consistent with previous results. LFTs normal 06/07/18. (all labs ordered are listed, but only abnormal results are displayed)  Labs Reviewed  SURGICAL PCR SCREEN - Abnormal; Notable for the following components:      Result Value   Staphylococcus aureus POSITIVE (*)    All other components within normal limits  BASIC METABOLIC PANEL - Abnormal; Notable for the following components:   Glucose, Bld 128 (*)    Creatinine, Ser 1.28 (*)    All other components within normal limits  CBC - Abnormal; Notable for the following components:   Platelets 143 (*)    All other components within normal limits  TYPE AND SCREEN     IMAGES: MRI L-spine 04/23/18: IMPRESSION: 1. Progressive level disease at L3-4 with now moderate to severe central canal stenosis. 2. Advanced facet hypertrophy at L3-4. Dynamic anterolisthesis on previous radiographs likely contributes to the stenosis at this level. 3. Stable fusion and laminectomy at L4-5 without residual or recurrent stenosis. 4. Slight progression of disc bulging at L2-3 with an annular tear. No compressive stenosis is present.  MRI Abdomen 10/31/17: IMPRESSION: 1. Marked improvement in a pancreatitis with a small fluid collection remaining in the body the pancreas were previously a large necrotic cystic collection was present. 2. Interval decrease in size of small pseudocyst along the LEFT anterior perirenal fascia. 3. no evidence of pancreatic ductal interruption. 4. Pancreas divisum variant ductal anatomy. 5. No new fluid collections. 6. No vascular complication  identified. 7. Marked improvement in  hepatic steatosis.  US Abdomen 07/28/17: IMPRESSION: 1. 2.9 x 2.2 x 3.1 cm heterogeneous mass in the head of the pancreas. Associated common bile duct dilatation to 8.2 mm noted. A 5.1 x 2.6 x 2.7 cm septated cystic mass  is noted in the region of the tail of the pancreas. These are new findings from prior MRI of 08/26/2016. Repeat MRI of the abdomen with MRCP is suggested for further evaluation. 2. Echogenic liver consistent with fatty infiltration or hepatocellular disease. 3. 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 Similar findings noted on prior CT of 07/17/2014. No evidence of abdominal aortic aneurysm.   EKG: 09/26/18: NSR. Negative T waves in III.   CV: CTA neck 05/24/17: IMPRESSION: 1. Stable small segment contained dissection of the left internal carotid artery beyond the bifurcation. 2. Small web or indentation of the cervical right internal carotid artery raises concern for fibromuscular dysplasia. 3. Potential filling defect just above this small web in the right internal carotid artery may be artifactual, but raises concern for a focal thrombus. Recommend catheter angiogram for further evaluation given significant dental artifact at this level. 4. Stable postsurgical and degenerative changes of the cervical spine. 5. Stable occluded distal left vertebral artery with retrograde flow to the level of the PICA. (According to 12/15/17 note by Dr. Oneida Alar, carotid angiogram not done due to admission for pancreatitis followed by admission for cervical fusion. Patient asymptomatic. Filling defect on the right felt likely artifact. Repeat carotid Duplex in one year recommended, and if no significant occlusive disease then as needed carotid folllow-up would be recommended.)   Carotid US 11/18/16:  Impression: 1.  Right internal carotid artery velocities suggest a 1 to 39% stenosis. 2.  Left internal carotid artery velocities suggest 1 to 39% stenosis. 3.  Short segment of left internal carotid artery has a small dissection, without evidence of hemodynamically significant stenosis.   Nuclear stress test 09/06/13: Overall Impression:  Low risk stress nuclear study  with small fixed septal and inferoapical defects which are likely artifact. No reversible ischemia. LV Wall Motion:  NL LV Function; NL Wall Motion; EF 56%    Past Medical History:  Diagnosis Date  . Abnormal EKG    anterolateral T wave inversion  . Anxiety   . Arthritis    "lower back, maybe some in my neck" (08/05/2017)  . Carotid artery dissection ([place]) 2016   left  . Chronic lower back pain    spondylolisthesis,spondylosis,DDD,stenosis  . Depression   . Dizziness   . Hyperlipidemia    was on meds but has been off x 5 months d/t elevated liver enzymes  . Hyperlipidemia    statin intolerant because of elevated liver function tests  . Iliac artery aneurysm, bilateral ([facility])    atable at 2.2 cm 07/28/17  . Insomnia    takes Trazodone nightly  . Numbness and tingling of both legs   . Opiate addiction ([place])   . Pancreatitis 08/03/2017  . PONV (postoperative nausea and vomiting)   . Skin cancer    "forehead; right posterior neck"  . Weakness    pain and occasionally tingling in right arm and leg    Past Surgical History:  Procedure Laterality Date  . ANTERIOR CERVICAL DECOMP/DISCECTOMY FUSION  2003, 2007   x 2  . ANTERIOR CERVICAL DECOMP/DISCECTOMY FUSION N/A 10/13/2017   Procedure: Cervical Four-Five Cervical Five-Six Anterior cervical decompression/discectomy/fusion with exploration of Cervical Three-Four, Cervical Six-Seven  fusions/possible removal of hardware;  Surgeon: Erline Levine, MD;  Location: [facility];  Service: Neurosurgery;  Laterality: N/A;  Cervical Four-Five Cervical Five-Six Anterior cervical decompression/discectomy/fusion with exploration of Cervical  . APPENDECTOMY  as a child  . BACK SURGERY    . COLONOSCOPY    . KNEE ARTHROSCOPY Right 2011  . LUMBAR LAMINECTOMY  2014   L4-5  . MOHS SURGERY     "forehead; right posterior neck"  . POSTERIOR LUMBAR FUSION  2015  . radial nerve impingement Right 2009   "near elbor"  . TIBIA FRACTURE SURGERY Right as a  child   compound fracture    MEDICATIONS: . aspirin 81 MG chewable tablet  . Cholecalciferol (VITAMIN D3) 50 MCG (2000 UT) TABS  . Cyanocobalamin (VITAMIN B-12) 5000 MCG SUBL  . fluticasone (FLONASE) 50 MCG/ACT nasal spray  . ibuprofen (ADVIL,MOTRIN) 200 MG tablet  . methocarbamol (ROBAXIN) 750 MG tablet  . Multiple Vitamins-Minerals (MULTIVITAMIN WITH MINERALS) tablet  . Naphazoline-Pheniramine (OPCON-A) 0.027-0.315 % SOLN  . pregabalin (LYRICA) 75 MG capsule  . rosuvastatin (CRESTOR) 20 MG tablet  . sertraline (ZOLOFT) 50 MG tablet  . sildenafil (VIAGRA) 100 MG tablet  . traZODone (DESYREL) 150 MG tablet   No current facility-administered medications for this encounter.   ASA on hold since 09/21/18.   Myra Gianotti, PA-C Surgical Short Stay/Anesthesiology [facility] Phone [phone] [facility] Phone [phone] 09/26/2018 4:13 PM

## 2018-09-26 NOTE — Anesthesia Preprocedure Evaluation (Addendum)
Anesthesia Evaluation  Patient identified by MRN, date of birth, ID band Patient awake    Reviewed: Allergy & Precautions, NPO status , Patient's Chart, lab work & pertinent test results  Airway Mallampati: II  TM Distance: >3 FB Neck ROM: Full    Dental  (+) Teeth Intact, Dental Advisory Given   Pulmonary former smoker,    breath sounds clear to auscultation       Cardiovascular  Rhythm:Regular Rate:Normal     Neuro/Psych    GI/Hepatic   Endo/Other    Renal/GU      Musculoskeletal   Abdominal   Peds  Hematology   Anesthesia Other Findings   Reproductive/Obstetrics                            Anesthesia Physical Anesthesia Plan  ASA: III  Anesthesia Plan: General   Post-op Pain Management:    Induction: Intravenous  PONV Risk Score and Plan: Ondansetron and Dexamethasone  Airway Management Planned: Oral ETT  Additional Equipment:   Intra-op Plan:   Post-operative Plan: Extubation in OR  Informed Consent: I have reviewed the patients History and Physical, chart, labs and discussed the procedure including the risks, benefits and alternatives for the proposed anesthesia with the patient or authorized representative who has indicated his/her understanding and acceptance.     Dental advisory given  Plan Discussed with: CRNA and Anesthesiologist  Anesthesia Plan Comments: (PAT note written 09/26/2018 by Myra Gianotti, PA-C. )       Anesthesia Quick Evaluation

## 2018-09-28 NOTE — H&P (Signed)
Patient ID:   [id number] Patient: Leonard [name]  Date of Birth: [birth year] Visit Type: Office Visit   Date: 08/16/2018 08:30 AM Provider: Marchia Meiers. Vertell Limber MD   This 58 year old male presents for back pain.  HISTORY OF PRESENT ILLNESS:  1.  back pain  Patient returns reporting increasing pain left lumbar, buttocks bilaterally, and left greater than right leg today. Patient reports numbness and weakness in both legs. Patient feels weak when going up and down steps. Patient reports falling due to right leg giving out. Patient grades his back pain at 8/10 and his leg pain at 6/10. Patient reports that his back and legs spasm when he sits. Patient has experienced pain for over one year. He notes his right leg continues to give way when walking.  Gait is shuffling due to pain.  Sitting no longer relieves the pain.  He wakes several times through the night due to pain. Patient reports no improvement with physical therapy, which he has been in the pool. Patient is unable to squat in both legs. Patient has tried physical therapy for over 2 months with no relief, and is being depleted financially with no relief.   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 Patient is unable to take narcotics and pain management.   Gabapentin 100 mg in the morning, 300 mg bedtime Robaxin 500 mg at bedtime         Medical/Surgical/Interim History Reviewed, no change.  Last detailed document date:07/21/2017.     PAST MEDICAL HISTORY, SURGICAL HISTORY, FAMILY HISTORY, SOCIAL HISTORY AND REVIEW OF SYSTEMS I have reviewed the patient's past medical, surgical, family and social history as well as the comprehensive review of systems as included on the [facility] history form dated 02/24/2018, which I have signed.  Family History:  Reviewed, no changes.  Last detailed document date:07/04/2013.   Social History: Reviewed, no changes. Last detailed  document date: 07/04/2013.    MEDICATIONS: (added, continued or stopped this visit) Started Medication Directions Instruction Stopped   Advil 200 mg tablet take 3 tablet by oral route  every 4 hours as needed with food    04/17/2018 gabapentin 100 mg capsule take 1 capsule by mouth each morning    04/17/2018 gabapentin 300 mg capsule take 1 capsule by oral route at bedtime    06/20/2018 Robaxin-750  750 mg tablet take 1 tablet by oral route  every 8 hours as needed     sertraline 50 mg tablet take 1 tablet by oral route 2 times every day    02/23/2018 trazodone 150 mg tablet take 1 tablet by oral route qpm     Vitamin D3 2,000 unit capsule Take 1 tablet daily       ALLERGIES: Ingredient Reaction Medication Name Comment  SULFA (SULFONAMIDE ANTIBIOTICS) Hives        PHYSICAL EXAM:   Vitals Date Temp F BP Pulse Ht In Wt Lb BMI BSA Pain Score  08/16/2018 98.4 149/84 77 70 192 27.55  6/10      IMPRESSION:   Upon examination, patient presents with weakness in both legs. Patient is unable to squat on either leg. Patient is hyporeflexic at the knees. Positive straight leg raise at 15 degrees bilaterally. Patient presents with scoliosis at L3-4 with right greater foraminal narrowing. Spondylolisthesis at L3-4. Patient has what appears to be a solid fusion at L4-5 and the X-Ray shows no complications with hardware. Patient has reduced listhesis upon  extension that is reduced to 2 mm and increases to 7 mm on flexion, 6 mm on neutral lateral radiograph. MRI shows stenosis and instability at L4. Patient presents with severe spinal stenosis and deterioration of the facet joints at L3-4.  X-Ray reports anterolisthesis at L3-4 that is new. Levoconvex curvature at L3-4. Moderate facet hypertrophy noted bilaterally, severe stenosis at L 34. Left laminectomy and fusion is stable. No residual or recurrent stenosis is present.  Proposed XLIF surgery and lateral plate at E8-3 from the right. Discussed  with patient the surgical procedure at length and showed patient hardware. Patient has attempted non-surgical interventions with no sustained relief.  Recommended surgical intervention to relieve patient of severe pain and weakness. Patient is unable to take narcotics to relieve pain and therapy has provided no relief. Patient will try an injection for temporary relief, but surgical intervention is necessary for sustained relief.   PLAN:  Patient has completed nearly 3 months of PT (which insurance company is requiring, but not paying for).  This is depleting him financially and has provided not improvement whatsoever.  He is complaining of progressively worsening pain and weakness and is now falling.  Recommend proceeding with Right XLIF L 34 with lateral plate.  Orders: Instruction(s)/Education: Assessment Instruction  R03.0 Hypertension education  Z68.27 Lifestyle education regarding diet   Completed Orders (this encounter) Order Details Reason Side Interpretation Result Initial Treatment Date Region  Hypertension education Continue to monitor blood pressure, if blood pressure remains elevated contact primary doctor        Lifestyle education regarding diet Patient encouraged to eat a well balance diet         Assessment/Plan   # Detail Type Description   1. Assessment Scoliosis (and kyphoscoliosis), idiopathic (M41.20).       2. Assessment Low back pain (M54.5).       3. Assessment Spondylolisthesis, lumbar region (M43.16).       4. Assessment Lumbar foraminal stenosis (M48.061).       5. 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       6. Assessment Radiculopathy, lumbar region (M54.16).       7. Assessment Elevated blood-pressure reading, w/o diagnosis of htn (R03.0).       8. Assessment Body mass index (BMI) 27.0-27.9, adult (T51.76).   Plan Orders Today's instructions / counseling include(s) Lifestyle education regarding diet. Clinical  information/comments: Patient encouraged to eat a well balance diet.         Pain Management Plan Pain Scale: 6/10. Method: Numeric Pain Intensity Scale. Location: back. Onset: 05/01/2017. Duration: varies. Quality: discomforting. Pain management follow-up plan of care: Patient taking medication as prescribed.  Fall Risk Plan The patient has not fallen in the last year.              Provider:  Marchia Meiers. Vertell Limber MD  08/16/2018 09:38 AM    Dictation edited by: Marchia Meiers. Vertell Limber    CC Providers: [place],  [place]  [id number]   Robert Nudelman MD  [address] [place], [place] [address]               Electronically signed by Marchia Meiers. Vertell Limber MD on 08/19/2018 03:24 PM

## 2018-09-29 ENCOUNTER — Encounter (HOSPITAL_COMMUNITY): Payer: Self-pay

## 2018-09-29 ENCOUNTER — Other Ambulatory Visit: Payer: Self-pay

## 2018-09-29 ENCOUNTER — Inpatient Hospital Stay (HOSPITAL_COMMUNITY): Payer: PRIVATE HEALTH INSURANCE | Admitting: Certified Registered Nurse Anesthetist

## 2018-09-29 ENCOUNTER — Inpatient Hospital Stay (HOSPITAL_COMMUNITY): Payer: PRIVATE HEALTH INSURANCE | Admitting: Vascular Surgery

## 2018-09-29 ENCOUNTER — Inpatient Hospital Stay (HOSPITAL_COMMUNITY): Payer: PRIVATE HEALTH INSURANCE

## 2018-09-29 ENCOUNTER — Encounter (HOSPITAL_COMMUNITY)
Admission: RE | Disposition: A | Discharge status: Home / Self Care | Payer: Self-pay | Source: Home / Self Care | Attending: Neurosurgery

## 2018-09-29 ENCOUNTER — Inpatient Hospital Stay (HOSPITAL_COMMUNITY)
Admission: RE | Admit: 2018-09-29 | Discharge: 2018-09-30 | DRG: 460 | Disposition: A | Discharge status: Home / Self Care | Payer: PRIVATE HEALTH INSURANCE | Attending: Neurosurgery | Admitting: Neurosurgery

## 2018-09-29 DIAGNOSIS — M48062 Spinal stenosis, lumbar region with neurogenic claudication: Secondary | ICD-10-CM | POA: Diagnosis present

## 2018-09-29 DIAGNOSIS — R030 Elevated blood-pressure reading, without diagnosis of hypertension: Secondary | ICD-10-CM | POA: Diagnosis present

## 2018-09-29 DIAGNOSIS — Z87891 Personal history of nicotine dependence: Secondary | ICD-10-CM

## 2018-09-29 DIAGNOSIS — F1021 Alcohol dependence, in remission: Secondary | ICD-10-CM | POA: Diagnosis present

## 2018-09-29 DIAGNOSIS — Z882 Allergy status to sulfonamides status: Secondary | ICD-10-CM

## 2018-09-29 DIAGNOSIS — M4126 Other idiopathic scoliosis, lumbar region: Secondary | ICD-10-CM | POA: Diagnosis present

## 2018-09-29 DIAGNOSIS — M5116 Intervertebral disc disorders with radiculopathy, lumbar region: Secondary | ICD-10-CM | POA: Diagnosis present

## 2018-09-29 DIAGNOSIS — M4120 Other idiopathic scoliosis, site unspecified: Secondary | ICD-10-CM

## 2018-09-29 DIAGNOSIS — Z79899 Other long term (current) drug therapy: Secondary | ICD-10-CM

## 2018-09-29 DIAGNOSIS — M48061 Spinal stenosis, lumbar region without neurogenic claudication: Secondary | ICD-10-CM | POA: Diagnosis present

## 2018-09-29 DIAGNOSIS — M4316 Spondylolisthesis, lumbar region: Secondary | ICD-10-CM | POA: Diagnosis present

## 2018-09-29 HISTORY — PX: ANTERIOR LAT LUMBAR FUSION: SHX1168

## 2018-09-29 SURGERY — ANTERIOR LATERAL LUMBAR FUSION 1 LEVEL
Anesthesia: General | Site: Spine Lumbar | Laterality: Right

## 2018-09-29 MED ORDER — NAPHAZOLINE-PHENIRAMINE 0.025-0.3 % OP SOLN: 1.0000 [drp] | Freq: Three times a day (TID) | OPHTHALMIC | Status: DC | PRN

## 2018-09-29 MED ORDER — PROPOFOL 500 MG/50ML IV EMUL
INTRAVENOUS | Status: DC | PRN
  Administered 2018-09-29: 50 ug/kg/min via INTRAVENOUS

## 2018-09-29 MED ORDER — VITAMIN D 25 MCG (1000 UNIT) PO TABS: 2000.0000 [IU] | ORAL_TABLET | Freq: Every day | ORAL | Status: DC

## 2018-09-29 MED ORDER — ONDANSETRON HCL 4 MG/2ML IJ SOLN: 4.0000 mg | Freq: Once | INTRAMUSCULAR | Status: DC | PRN

## 2018-09-29 MED ORDER — BUPIVACAINE HCL (PF) 0.5 % IJ SOLN
INTRAMUSCULAR | Status: DC | PRN
  Administered 2018-09-29: 5 mL

## 2018-09-29 MED ORDER — SODIUM CHLORIDE 0.9% FLUSH
3.0000 mL | Freq: Two times a day (BID) | INTRAVENOUS | Status: DC
  Administered 2018-09-29: 3 mL via INTRAVENOUS

## 2018-09-29 MED ORDER — PHENYLEPHRINE 40 MCG/ML (10ML) SYRINGE FOR IV PUSH (FOR BLOOD PRESSURE SUPPORT)
PREFILLED_SYRINGE | INTRAVENOUS | Status: DC | PRN
  Administered 2018-09-29: 80 ug via INTRAVENOUS
  Administered 2018-09-29: 120 ug via INTRAVENOUS
  Administered 2018-09-29 (×2): 80 ug via INTRAVENOUS

## 2018-09-29 MED ORDER — ONDANSETRON HCL 4 MG/2ML IJ SOLN
INTRAMUSCULAR | Status: AC
  Filled 2018-09-29: qty 2

## 2018-09-29 MED ORDER — PROPOFOL 10 MG/ML IV BOLUS
INTRAVENOUS | Status: AC
  Filled 2018-09-29: qty 40

## 2018-09-29 MED ORDER — CHLORHEXIDINE GLUCONATE CLOTH 2 % EX PADS: 6.0000 | MEDICATED_PAD | Freq: Once | CUTANEOUS | Status: DC

## 2018-09-29 MED ORDER — MENTHOL 3 MG MT LOZG: 1.0000 | LOZENGE | OROMUCOSAL | Status: DC | PRN

## 2018-09-29 MED ORDER — IBUPROFEN 200 MG PO TABS: 600.0000 mg | ORAL_TABLET | Freq: Four times a day (QID) | ORAL | Status: DC | PRN

## 2018-09-29 MED ORDER — METHOCARBAMOL 750 MG PO TABS
750.0000 mg | ORAL_TABLET | Freq: Four times a day (QID) | ORAL | Status: DC | PRN
  Administered 2018-09-29 – 2018-09-30 (×2): 750 mg via ORAL
  Filled 2018-09-29 (×2): qty 1

## 2018-09-29 MED ORDER — OXYCODONE HCL 5 MG/5ML PO SOLN: 5.0000 mg | Freq: Once | ORAL | Status: DC | PRN

## 2018-09-29 MED ORDER — METHOCARBAMOL 500 MG PO TABS
ORAL_TABLET | ORAL | Status: AC
  Filled 2018-09-29: qty 1

## 2018-09-29 MED ORDER — PANTOPRAZOLE SODIUM 40 MG IV SOLR: 40.0000 mg | Freq: Every day | INTRAVENOUS | Status: DC

## 2018-09-29 MED ORDER — GLYCOPYRROLATE PF 0.2 MG/ML IJ SOSY
PREFILLED_SYRINGE | INTRAMUSCULAR | Status: AC
  Filled 2018-09-29: qty 1

## 2018-09-29 MED ORDER — LIDOCAINE-EPINEPHRINE 1 %-1:100000 IJ SOLN
INTRAMUSCULAR | Status: DC | PRN
  Administered 2018-09-29: 5 mL

## 2018-09-29 MED ORDER — DEXAMETHASONE SODIUM PHOSPHATE 10 MG/ML IJ SOLN
INTRAMUSCULAR | Status: DC | PRN
  Administered 2018-09-29: 10 mg via INTRAVENOUS

## 2018-09-29 MED ORDER — MIDAZOLAM HCL 2 MG/2ML IJ SOLN
INTRAMUSCULAR | Status: DC | PRN
  Administered 2018-09-29: 2 mg via INTRAVENOUS

## 2018-09-29 MED ORDER — PROPOFOL 10 MG/ML IV BOLUS
INTRAVENOUS | Status: DC | PRN
  Administered 2018-09-29: 170 mg via INTRAVENOUS

## 2018-09-29 MED ORDER — OXYCODONE HCL 5 MG PO TABS: 5.0000 mg | ORAL_TABLET | Freq: Once | ORAL | Status: DC | PRN

## 2018-09-29 MED ORDER — SUCCINYLCHOLINE CHLORIDE 200 MG/10ML IV SOSY
PREFILLED_SYRINGE | INTRAVENOUS | Status: AC
  Filled 2018-09-29: qty 10

## 2018-09-29 MED ORDER — ONDANSETRON HCL 4 MG/2ML IJ SOLN
INTRAMUSCULAR | Status: DC | PRN
  Administered 2018-09-29: 4 mg via INTRAVENOUS

## 2018-09-29 MED ORDER — THROMBIN 5000 UNITS EX SOLR
OROMUCOSAL | Status: DC | PRN
  Administered 2018-09-29: 5 mL via TOPICAL

## 2018-09-29 MED ORDER — METHOCARBAMOL 750 MG PO TABS: 750.0000 mg | ORAL_TABLET | Freq: Every day | ORAL | Status: DC

## 2018-09-29 MED ORDER — ALUM & MAG HYDROXIDE-SIMETH 200-200-20 MG/5ML PO SUSP
30.0000 mL | Freq: Four times a day (QID) | ORAL | Status: DC | PRN
  Administered 2018-09-30: 30 mL via ORAL
  Filled 2018-09-29: qty 30

## 2018-09-29 MED ORDER — LIDOCAINE 2% (20 MG/ML) 5 ML SYRINGE
INTRAMUSCULAR | Status: AC
  Filled 2018-09-29: qty 5

## 2018-09-29 MED ORDER — ACETAMINOPHEN 325 MG PO TABS: 650.0000 mg | ORAL_TABLET | ORAL | Status: DC | PRN

## 2018-09-29 MED ORDER — ACETAMINOPHEN 650 MG RE SUPP: 650.0000 mg | RECTAL | Status: DC | PRN

## 2018-09-29 MED ORDER — ROSUVASTATIN CALCIUM 20 MG PO TABS: 20.0000 mg | ORAL_TABLET | Freq: Every day | ORAL | Status: DC

## 2018-09-29 MED ORDER — OXYCODONE-ACETAMINOPHEN 5-325 MG PO TABS: 1.0000 | ORAL_TABLET | ORAL | Status: DC | PRN

## 2018-09-29 MED ORDER — KETOROLAC TROMETHAMINE 30 MG/ML IJ SOLN
30.0000 mg | Freq: Four times a day (QID) | INTRAMUSCULAR | Status: DC
  Administered 2018-09-29 – 2018-09-30 (×3): 30 mg via INTRAVENOUS
  Filled 2018-09-29 (×3): qty 1

## 2018-09-29 MED ORDER — SERTRALINE HCL 50 MG PO TABS
75.0000 mg | ORAL_TABLET | Freq: Two times a day (BID) | ORAL | Status: DC
  Administered 2018-09-29: 22:00:00 75 mg via ORAL
  Filled 2018-09-29: qty 2

## 2018-09-29 MED ORDER — OXYCODONE HCL 5 MG PO TABS
10.0000 mg | ORAL_TABLET | ORAL | Status: DC | PRN
  Administered 2018-09-29 – 2018-09-30 (×5): 10 mg via ORAL
  Filled 2018-09-29 (×4): qty 2

## 2018-09-29 MED ORDER — FENTANYL CITRATE (PF) 250 MCG/5ML IJ SOLN
INTRAMUSCULAR | Status: AC
  Filled 2018-09-29: qty 5

## 2018-09-29 MED ORDER — SILDENAFIL CITRATE 100 MG PO TABS: 100.0000 mg | ORAL_TABLET | Freq: Every day | ORAL | Status: DC | PRN

## 2018-09-29 MED ORDER — PANTOPRAZOLE SODIUM 40 MG PO TBEC
40.0000 mg | DELAYED_RELEASE_TABLET | Freq: Every day | ORAL | Status: DC
  Administered 2018-09-29: 22:00:00 40 mg via ORAL
  Filled 2018-09-29: qty 1

## 2018-09-29 MED ORDER — NAPHAZOLINE-PHENIRAMINE 0.027-0.315 % OP SOLN: 1.0000 [drp] | Freq: Three times a day (TID) | OPHTHALMIC | Status: DC | PRN

## 2018-09-29 MED ORDER — LIDOCAINE-EPINEPHRINE 1 %-1:100000 IJ SOLN
INTRAMUSCULAR | Status: AC
  Filled 2018-09-29: qty 1

## 2018-09-29 MED ORDER — SODIUM CHLORIDE 0.9 % IV SOLN
250.0000 mL | INTRAVENOUS | Status: DC
  Administered 2018-09-29: 250 mL via INTRAVENOUS

## 2018-09-29 MED ORDER — POLYETHYLENE GLYCOL 3350 17 G PO PACK: 17.0000 g | PACK | Freq: Every day | ORAL | Status: DC | PRN

## 2018-09-29 MED ORDER — FENTANYL CITRATE (PF) 100 MCG/2ML IJ SOLN
INTRAMUSCULAR | Status: AC
  Filled 2018-09-29: qty 2

## 2018-09-29 MED ORDER — TRAZODONE HCL 150 MG PO TABS
150.0000 mg | ORAL_TABLET | Freq: Every day | ORAL | Status: DC
  Administered 2018-09-29: 22:00:00 150 mg via ORAL
  Filled 2018-09-29: qty 1

## 2018-09-29 MED ORDER — CEFAZOLIN SODIUM-DEXTROSE 2-4 GM/100ML-% IV SOLN
2.0000 g | INTRAVENOUS | Status: AC
  Administered 2018-09-29: 15:00:00 2 g via INTRAVENOUS

## 2018-09-29 MED ORDER — DOCUSATE SODIUM 100 MG PO CAPS
100.0000 mg | ORAL_CAPSULE | Freq: Two times a day (BID) | ORAL | Status: DC
  Administered 2018-09-29: 22:00:00 100 mg via ORAL
  Filled 2018-09-29: qty 1

## 2018-09-29 MED ORDER — ZOLPIDEM TARTRATE 5 MG PO TABS: 5.0000 mg | ORAL_TABLET | Freq: Every evening | ORAL | Status: DC | PRN

## 2018-09-29 MED ORDER — METHOCARBAMOL 1000 MG/10ML IJ SOLN
500.0000 mg | Freq: Four times a day (QID) | INTRAVENOUS | Status: DC | PRN
  Filled 2018-09-29: qty 5

## 2018-09-29 MED ORDER — SUCCINYLCHOLINE CHLORIDE 200 MG/10ML IV SOSY
PREFILLED_SYRINGE | INTRAVENOUS | Status: DC | PRN
  Administered 2018-09-29: 150 mg via INTRAVENOUS

## 2018-09-29 MED ORDER — KCL IN DEXTROSE-NACL 20-5-0.45 MEQ/L-%-% IV SOLN: INTRAVENOUS | Status: DC

## 2018-09-29 MED ORDER — MORPHINE SULFATE (PF) 2 MG/ML IV SOLN
2.0000 mg | INTRAVENOUS | Status: DC | PRN
  Administered 2018-09-29: 2 mg via INTRAVENOUS
  Filled 2018-09-29: qty 1

## 2018-09-29 MED ORDER — LACTATED RINGERS IV SOLN
INTRAVENOUS | Status: DC
  Administered 2018-09-29 (×2): via INTRAVENOUS

## 2018-09-29 MED ORDER — OXYCODONE HCL 5 MG PO TABS
ORAL_TABLET | ORAL | Status: AC
  Filled 2018-09-29: qty 2

## 2018-09-29 MED ORDER — LIDOCAINE 2% (20 MG/ML) 5 ML SYRINGE
INTRAMUSCULAR | Status: DC | PRN
  Administered 2018-09-29: 60 mg via INTRAVENOUS

## 2018-09-29 MED ORDER — FLUTICASONE PROPIONATE 50 MCG/ACT NA SUSP
1.0000 | Freq: Two times a day (BID) | NASAL | Status: DC
  Filled 2018-09-29: qty 16

## 2018-09-29 MED ORDER — VITAMIN B-12 1000 MCG PO TABS
5000.0000 ug | ORAL_TABLET | Freq: Every day | ORAL | Status: DC
  Filled 2018-09-29: qty 5

## 2018-09-29 MED ORDER — DEXAMETHASONE SODIUM PHOSPHATE 10 MG/ML IJ SOLN
INTRAMUSCULAR | Status: AC
  Filled 2018-09-29: qty 1

## 2018-09-29 MED ORDER — ONDANSETRON HCL 4 MG/2ML IJ SOLN: 4.0000 mg | Freq: Four times a day (QID) | INTRAMUSCULAR | Status: DC | PRN

## 2018-09-29 MED ORDER — DIPHENHYDRAMINE HCL 50 MG/ML IJ SOLN
INTRAMUSCULAR | Status: AC
  Filled 2018-09-29: qty 1

## 2018-09-29 MED ORDER — BISACODYL 10 MG RE SUPP: 10.0000 mg | Freq: Every day | RECTAL | Status: DC | PRN

## 2018-09-29 MED ORDER — FLEET ENEMA 7-19 GM/118ML RE ENEM: 1.0000 | ENEMA | Freq: Once | RECTAL | Status: DC | PRN

## 2018-09-29 MED ORDER — SODIUM CHLORIDE 0.9 % IV SOLN
INTRAVENOUS | Status: DC | PRN
  Administered 2018-09-29: 15:00:00 30 ug/min via INTRAVENOUS

## 2018-09-29 MED ORDER — FENTANYL CITRATE (PF) 100 MCG/2ML IJ SOLN
25.0000 ug | INTRAMUSCULAR | Status: DC | PRN
  Administered 2018-09-29: 25 ug via INTRAVENOUS
  Administered 2018-09-29: 50 ug via INTRAVENOUS

## 2018-09-29 MED ORDER — SODIUM CHLORIDE 0.9% FLUSH: 3.0000 mL | INTRAVENOUS | Status: DC | PRN

## 2018-09-29 MED ORDER — BUPIVACAINE HCL (PF) 0.5 % IJ SOLN
INTRAMUSCULAR | Status: AC
  Filled 2018-09-29: qty 30

## 2018-09-29 MED ORDER — CEFAZOLIN SODIUM-DEXTROSE 2-4 GM/100ML-% IV SOLN
2.0000 g | Freq: Three times a day (TID) | INTRAVENOUS | Status: AC
  Administered 2018-09-29 – 2018-09-30 (×2): 2 g via INTRAVENOUS
  Filled 2018-09-29 (×2): qty 100

## 2018-09-29 MED ORDER — PHENOL 1.4 % MT LIQD: 1.0000 | OROMUCOSAL | Status: DC | PRN

## 2018-09-29 MED ORDER — 0.9 % SODIUM CHLORIDE (POUR BTL) OPTIME
TOPICAL | Status: DC | PRN
  Administered 2018-09-29: 1000 mL

## 2018-09-29 MED ORDER — MIDAZOLAM HCL 2 MG/2ML IJ SOLN
INTRAMUSCULAR | Status: AC
  Filled 2018-09-29: qty 2

## 2018-09-29 MED ORDER — PHENYLEPHRINE 40 MCG/ML (10ML) SYRINGE FOR IV PUSH (FOR BLOOD PRESSURE SUPPORT)
PREFILLED_SYRINGE | INTRAVENOUS | Status: AC
  Filled 2018-09-29: qty 10

## 2018-09-29 MED ORDER — DIPHENHYDRAMINE HCL 50 MG/ML IJ SOLN
INTRAMUSCULAR | Status: DC | PRN
  Administered 2018-09-29: 12.5 mg via INTRAVENOUS

## 2018-09-29 MED ORDER — CEFAZOLIN SODIUM-DEXTROSE 2-4 GM/100ML-% IV SOLN
INTRAVENOUS | Status: AC
  Filled 2018-09-29: qty 100

## 2018-09-29 MED ORDER — OXYCODONE HCL 5 MG PO TABS: 5.0000 mg | ORAL_TABLET | ORAL | Status: DC | PRN

## 2018-09-29 MED ORDER — FENTANYL CITRATE (PF) 100 MCG/2ML IJ SOLN: 25.0000 ug | INTRAMUSCULAR | Status: DC | PRN

## 2018-09-29 MED ORDER — VITAMIN B-12 5000 MCG SL SUBL: 5000.0000 ug | SUBLINGUAL_TABLET | Freq: Every day | SUBLINGUAL | Status: DC

## 2018-09-29 MED ORDER — PREGABALIN 75 MG PO CAPS
75.0000 mg | ORAL_CAPSULE | Freq: Two times a day (BID) | ORAL | Status: DC
  Administered 2018-09-29: 22:00:00 75 mg via ORAL
  Filled 2018-09-29: qty 1

## 2018-09-29 MED ORDER — METHOCARBAMOL 500 MG PO TABS
500.0000 mg | ORAL_TABLET | Freq: Four times a day (QID) | ORAL | Status: DC | PRN
  Administered 2018-09-29: 500 mg via ORAL

## 2018-09-29 MED ORDER — THROMBIN 5000 UNITS EX SOLR
CUTANEOUS | Status: AC
  Filled 2018-09-29: qty 5000

## 2018-09-29 MED ORDER — FENTANYL CITRATE (PF) 250 MCG/5ML IJ SOLN
INTRAMUSCULAR | Status: DC | PRN
  Administered 2018-09-29 (×3): 50 ug via INTRAVENOUS

## 2018-09-29 MED ORDER — ONDANSETRON HCL 4 MG PO TABS: 4.0000 mg | ORAL_TABLET | Freq: Four times a day (QID) | ORAL | Status: DC | PRN

## 2018-09-29 MED ORDER — ADULT MULTIVITAMIN W/MINERALS CH
1.0000 | ORAL_TABLET | Freq: Every day | ORAL | Status: DC
  Filled 2018-09-29: qty 1

## 2018-09-29 SURGICAL SUPPLY — 55 items
ADH SKN CLS APL DERMABOND .7 (GAUZE/BANDAGES/DRESSINGS) ×1
BLADE CLIPPER SURG (BLADE) IMPLANT
BOLT PLATE XLIF 5.5X55 LRG (Bolt) ×1 IMPLANT
CARTRIDGE OIL MAESTRO DRILL (MISCELLANEOUS) ×1 IMPLANT
COVER WAND RF STERILE (DRAPES) ×2 IMPLANT
DECANTER SPIKE VIAL GLASS SM (MISCELLANEOUS) ×2 IMPLANT
DERMABOND ADVANCED (GAUZE/BANDAGES/DRESSINGS) ×1
DERMABOND ADVANCED .7 DNX12 (GAUZE/BANDAGES/DRESSINGS) ×2 IMPLANT
DIFFUSER DRILL AIR PNEUMATIC (MISCELLANEOUS) ×2 IMPLANT
DRAPE C-ARM 42X72 X-RAY (DRAPES) ×2 IMPLANT
DRAPE C-ARMOR (DRAPES) ×2 IMPLANT
DRAPE LAPAROTOMY 100X72X124 (DRAPES) ×2 IMPLANT
DRSG OPSITE POSTOP 3X4 (GAUZE/BANDAGES/DRESSINGS) ×2 IMPLANT
DURAPREP 26ML APPLICATOR (WOUND CARE) ×2 IMPLANT
ELECT REM PT RETURN 9FT ADLT (ELECTROSURGICAL) ×2
ELECTRODE REM PT RTRN 9FT ADLT (ELECTROSURGICAL) ×1 IMPLANT
GAUZE 4X4 16PLY RFD (DISPOSABLE) IMPLANT
GLOVE BIO SURGEON STRL SZ8 (GLOVE) ×2 IMPLANT
GLOVE BIOGEL PI IND STRL 8 (GLOVE) ×1 IMPLANT
GLOVE BIOGEL PI IND STRL 8.5 (GLOVE) ×1 IMPLANT
GLOVE BIOGEL PI INDICATOR 8 (GLOVE) ×1
GLOVE BIOGEL PI INDICATOR 8.5 (GLOVE) ×1
GLOVE ECLIPSE 8.0 STRL XLNG CF (GLOVE) ×2 IMPLANT
GLOVE EXAM NITRILE XL STR (GLOVE) IMPLANT
GOWN STRL REUS W/ TWL LRG LVL3 (GOWN DISPOSABLE) IMPLANT
GOWN STRL REUS W/ TWL XL LVL3 (GOWN DISPOSABLE) ×2 IMPLANT
GOWN STRL REUS W/TWL 2XL LVL3 (GOWN DISPOSABLE) IMPLANT
GOWN STRL REUS W/TWL LRG LVL3 (GOWN DISPOSABLE)
GOWN STRL REUS W/TWL XL LVL3 (GOWN DISPOSABLE) ×4
KIT BASIN OR (CUSTOM PROCEDURE TRAY) ×2 IMPLANT
KIT DILATOR XLIF 5 (KITS) ×1 IMPLANT
KIT INFUSE XX SMALL 0.7CC (Orthopedic Implant) ×1 IMPLANT
KIT SURGICAL ACCESS MAXCESS 4 (KITS) ×1 IMPLANT
KIT TURNOVER KIT B (KITS) ×2 IMPLANT
MODULE NVM5 NEXT GEN EMG (NEEDLE) ×1 IMPLANT
MODULUS XLW 12X22X55MM 10 (Spine Construct) ×1 IMPLANT
NDL HYPO 25X1 1.5 SAFETY (NEEDLE) ×1 IMPLANT
NEEDLE HYPO 25X1 1.5 SAFETY (NEEDLE) ×2 IMPLANT
NS IRRIG 1000ML POUR BTL (IV SOLUTION) ×2 IMPLANT
OIL CARTRIDGE MAESTRO DRILL (MISCELLANEOUS) ×2
PACK LAMINECTOMY NEURO (CUSTOM PROCEDURE TRAY) ×2 IMPLANT
PLATE DECADE XLIF 2H SZ14 (Plate) ×1 IMPLANT
PUTTY BONE ATTRAX 5CC STRIP (Putty) ×1 IMPLANT
SCREW DECADE 5.5X50 (Screw) ×1 IMPLANT
SPONGE LAP 4X18 RFD (DISPOSABLE) IMPLANT
SPONGE SURGIFOAM ABS GEL SZ50 (HEMOSTASIS) IMPLANT
STAPLER SKIN PROX WIDE 3.9 (STAPLE) ×2 IMPLANT
SUT VIC AB 1 CT1 18XBRD ANBCTR (SUTURE) ×1 IMPLANT
SUT VIC AB 1 CT1 8-18 (SUTURE) ×2
SUT VIC AB 2-0 CT1 18 (SUTURE) ×2 IMPLANT
SUT VIC AB 3-0 SH 8-18 (SUTURE) ×2 IMPLANT
TOWEL GREEN STERILE (TOWEL DISPOSABLE) IMPLANT
TOWEL GREEN STERILE FF (TOWEL DISPOSABLE) IMPLANT
TRAY FOLEY MTR SLVR 16FR STAT (SET/KITS/TRAYS/PACK) ×2 IMPLANT
WATER STERILE IRR 1000ML POUR (IV SOLUTION) ×2 IMPLANT

## 2018-09-29 NOTE — Interval H&P Note (Signed)
History and Physical Interval Note:  09/29/2018 1:49 PM  Leonard [name]  has presented today for surgery, with the diagnosis of Scoliosis, Kyphoscoliosis, Idiopathic.  The various methods of treatment have been discussed with the patient and family. After consideration of risks, benefits and other options for treatment, the patient has consented to  Procedure(s) with comments: Right Lumbar 3-4 Anterolateral lumbar interbody fusion with lateral plate (Right) - Right Lumbar 3-4 Anterolateral lumbar interbody fusion with lateral plate as a surgical intervention.  The patient's history has been reviewed, patient examined, no change in status, stable for surgery.  I have reviewed the patient's chart and labs.  Questions were answered to the patient's satisfaction.     Peggyann Shoals

## 2018-09-29 NOTE — Progress Notes (Signed)
Patient is awake, alert, conversant.  MAEW with good strength. No numbness.  He is doing well.

## 2018-09-29 NOTE — Brief Op Note (Signed)
09/29/2018  4:31 PM  PATIENT:  Leonard [name]  58 y.o. male  PRE-OPERATIVE DIAGNOSIS:  Scoliosis, Kyphoscoliosis, Idiopathic, spondylolisthesis, stenosis, herniated lumbar disc, lumbago, radiculopathy   POST-OPERATIVE DIAGNOSIS:  Scoliosis, Kyphoscoliosis, Idiopathic, spondylolisthesis, stenosis, herniated lumbar disc, lumbago, radiculopathy    PROCEDURE:  Procedure(s): Right Lumbar three-lumbar four Anterolateral lumbar interbody fusion with lateral plate (Right)  SURGEON:  Surgeon(s) and Role:    Erline Levine, MD - Primary    * Earnie Larsson, MD - Assisting  PHYSICIAN ASSISTANT:   ASSISTANTS: Poteat, RN   ANESTHESIA:   general  EBL:  50 mL   BLOOD ADMINISTERED:none  DRAINS: none   LOCAL MEDICATIONS USED:  MARCAINE    and LIDOCAINE   SPECIMEN:  No Specimen  DISPOSITION OF SPECIMEN:  N/A  COUNTS:  YES  TOURNIQUET:  * No tourniquets in log *  DICTATION: Patient is a 58 year old with severe spondylosis stenosis and scoliosis of the lumbar spine with spodylolisthesis. It was elected to take him to surgery for anterolateral decompression and lateral plate fixation at the L 34 level.  He had previously undergone fusion L 4 - L 5  levels.  Procedure: Patient was brought to the operating room and placed in a left lateral decubitus position on the operative table and using orthogonally projected C-arm fluoroscopy the patient was placed so that the L 34  level was visualized in AP and lateral plane. The patient was then taped into position. The table was flexed so as to expose the L 34 level. Skin was marked along with a posterior finger dissection incision. His flank was then prepped and draped in usual sterile fashion and incisions were made overlying the L 34 interspace. Posterior finger dissection was made to enter the retroperitoneal space and then subsequently the probe was inserted into the psoas muscle from the right side initially at the L34 level. After mapping the neural  elements were able to dock the probe per the midpoint of this vertebral level and without indications electrically of too close proximity to the neural tissues. Subsequently the self-retaining tractor was.after sequential dilators were utilized the shim was employed and the interspace was cleared of psoas muscle and then incised. A thorough discectomy was performed. Instruments were used to clear the interspace of disc material. After thorough discectomy was performed and this was performed using AP and lateral fluoroscopy a 12 lordotic by 55 x 22 mm implant was packed with BMP and Attrax bone graft extender. This was tamped into position and its position was confirmed on AP and lateral fluoroscopy. A Decade lateral plate (size 14) was affixed to the lateral spine with 5.5 x 55 mm screw in L 4 and 5.5 x 50 mm bolt at L 3. The two screws were torque locked and positioning was confirmed with AP and lateral fluoroscopy. . Hemostasis was assured the wounds were irrigated interrupted Vicryl sutures.Sterile occlusive dressing was placed with Dermabond and occlusive dressings. The patient was then extubated in the operating room and taken to recovery in stable and satisfactory condition having tolerated his operation well. Counts were correct at the end of the case.  PLAN OF CARE: Admit to inpatient   PATIENT DISPOSITION:  PACU - hemodynamically stable.   Delay start of Pharmacological VTE agent (>24hrs) due to surgical blood loss or risk of bleeding: yes

## 2018-09-29 NOTE — Anesthesia Procedure Notes (Signed)
Procedure Name: Intubation Performed by: Valda Favia, CRNA Pre-anesthesia Checklist: Patient identified, Emergency Drugs available, Suction available and Patient being monitored Patient Re-evaluated:Patient Re-evaluated prior to induction Oxygen Delivery Method: Circle System Utilized Preoxygenation: Pre-oxygenation with 100% oxygen Induction Type: IV induction Ventilation: Mask ventilation without difficulty Laryngoscope Size: Mac and 4 Grade View: Grade I Tube type: Oral Tube size: 7.5 mm Number of attempts: 1 Airway Equipment and Method: Stylet and Oral airway Placement Confirmation: ETT inserted through vocal cords under direct vision,  positive ETCO2 and breath sounds checked- equal and bilateral Secured at: 21 cm Tube secured with: Tape Dental Injury: Teeth and Oropharynx as per pre-operative assessment

## 2018-09-29 NOTE — Transfer of Care (Signed)
Immediate Anesthesia Transfer of Care Note  Patient: Leonard [name]  Procedure(s) Performed: Right Lumbar three-lumbar four Anterolateral lumbar interbody fusion with lateral plate (Right Spine Lumbar)  Patient Location: PACU  Anesthesia Type:General  Level of Consciousness: drowsy and patient cooperative  Airway & Oxygen Therapy: Patient Spontanous Breathing and Patient connected to nasal cannula oxygen  Post-op Assessment: Report given to RN and Post -op Vital signs reviewed and stable  Post vital signs: Reviewed and stable  Last Vitals:  Vitals Value Taken Time  BP 120/81 09/29/18 1639  Temp    Pulse 84 09/29/18 1644  Resp 19 09/29/18 1644  SpO2 95 % 09/29/18 1644  Vitals shown include unvalidated device data.  Last Pain:  Vitals:   09/29/18 1244  TempSrc: Oral  PainSc: 7       Patients Stated Pain Goal: 3 (22/41/14 6431)  Complications: No apparent anesthesia complications

## 2018-09-29 NOTE — Op Note (Signed)
09/29/2018  4:31 PM  PATIENT:  Leonard [name]  58 y.o. male  PRE-OPERATIVE DIAGNOSIS:  Scoliosis, Kyphoscoliosis, Idiopathic, spondylolisthesis, stenosis, herniated lumbar disc, lumbago, radiculopathy   POST-OPERATIVE DIAGNOSIS:  Scoliosis, Kyphoscoliosis, Idiopathic, spondylolisthesis, stenosis, herniated lumbar disc, lumbago, radiculopathy    PROCEDURE:  Procedure(s): Right Lumbar three-lumbar four Anterolateral lumbar interbody fusion with lateral plate (Right)  SURGEON:  Surgeon(s) and Role:    Erline Levine, MD - Primary    * Earnie Larsson, MD - Assisting  PHYSICIAN ASSISTANT:   ASSISTANTS: Poteat, RN   ANESTHESIA:   general  EBL:  50 mL   BLOOD ADMINISTERED:none  DRAINS: none   LOCAL MEDICATIONS USED:  MARCAINE    and LIDOCAINE   SPECIMEN:  No Specimen  DISPOSITION OF SPECIMEN:  N/A  COUNTS:  YES  TOURNIQUET:  * No tourniquets in log *  DICTATION: Patient is a 58 year old with severe spondylosis stenosis and scoliosis of the lumbar spine with spodylolisthesis. It was elected to take him to surgery for anterolateral decompression and lateral plate fixation at the L 34 level.  He had previously undergone fusion L 4 - L 5  levels.  Procedure: Patient was brought to the operating room and placed in a left lateral decubitus position on the operative table and using orthogonally projected C-arm fluoroscopy the patient was placed so that the L 34  level was visualized in AP and lateral plane. The patient was then taped into position. The table was flexed so as to expose the L 34 level. Skin was marked along with a posterior finger dissection incision. His flank was then prepped and draped in usual sterile fashion and incisions were made overlying the L 34 interspace. Posterior finger dissection was made to enter the retroperitoneal space and then subsequently the probe was inserted into the psoas muscle from the right side initially at the L34 level. After mapping the neural  elements were able to dock the probe per the midpoint of this vertebral level and without indications electrically of too close proximity to the neural tissues. Subsequently the self-retaining tractor was.after sequential dilators were utilized the shim was employed and the interspace was cleared of psoas muscle and then incised. A thorough discectomy was performed. Instruments were used to clear the interspace of disc material. After thorough discectomy was performed and this was performed using AP and lateral fluoroscopy a 12 lordotic by 55 x 22 mm implant was packed with BMP and Attrax bone graft extender. This was tamped into position and its position was confirmed on AP and lateral fluoroscopy. A Decade lateral plate (size 14) was affixed to the lateral spine with 5.5 x 55 mm screw in L 4 and 5.5 x 50 mm bolt at L 3. The two screws were torque locked and positioning was confirmed with AP and lateral fluoroscopy. . Hemostasis was assured the wounds were irrigated interrupted Vicryl sutures.Sterile occlusive dressing was placed with Dermabond and occlusive dressings. The patient was then extubated in the operating room and taken to recovery in stable and satisfactory condition having tolerated his operation well. Counts were correct at the end of the case.  PLAN OF CARE: Admit to inpatient   PATIENT DISPOSITION:  PACU - hemodynamically stable.   Delay start of Pharmacological VTE agent (>24hrs) due to surgical blood loss or risk of bleeding: yes

## 2018-09-30 MED ORDER — OXYCODONE-ACETAMINOPHEN 5-325 MG PO TABS: 1.0000 | ORAL_TABLET | ORAL | 0 refills | Status: DC | PRN

## 2018-09-30 MED ORDER — METHOCARBAMOL 750 MG PO TABS: 750.0000 mg | ORAL_TABLET | Freq: Four times a day (QID) | ORAL | 1 refills | Status: DC | PRN

## 2018-09-30 NOTE — Discharge Instructions (Signed)

## 2018-09-30 NOTE — Plan of Care (Signed)
Patient alert and oriented, Leonard [name]'s well, voiding adequate amount of urine, swallowing without difficulty, c/o moderate pain and meds given prior to discharged for ride and discomfort. Patient discharged home with family. Script and discharged instructions given to patient. Patient and family stated understanding of instructions given. Patient has an appointment with Dr. Vertell Limber in 3 weeks.

## 2018-09-30 NOTE — Progress Notes (Signed)
Subjective: Patient reports doing well  Objective: Vital signs in last 24 hours: Temp:  [97.7 F (36.5 C)-98.9 F (37.2 C)] 98.3 F (36.8 C) (07/25 0359) Pulse Rate:  [74-95] 89 (07/25 0401) Resp:  [13-20] 18 (07/25 0359) BP: (114-137)/(63-90) 114/76 (07/25 0359) SpO2:  [89 %-97 %] 91 % (07/25 0401) Weight:  [88 kg] 88 kg (07/24 1244)  Intake/Output from previous day: 07/24 0701 - 07/25 0700 In: 7035 [P.O.:240; I.V.:1203] Out: 810 [Urine:760; Blood:50] Intake/Output this shift: No intake/output data recorded.  Physical Exam: Dressings CDI.  Strength full.  Lab Results: No results for input(s): WBC, HGB, HCT, PLT in the last 72 hours. BMET No results for input(s): NA, K, CL, CO2, GLUCOSE, BUN, CREATININE, CALCIUM in the last 72 hours.  Studies/Results: Dg Lumbar Spine 2-3 Views  Result Date: 09/29/2018 CLINICAL DATA:  RIGHT L3-L4 anterior lumbar interbody fusion with lateral plate EXAM: LUMBAR SPINE - 2-3 VIEW; DG C-ARM 61-120 MIN COMPARISON:  08/11/2015, 04/17/2018 FLUOROSCOPY TIME:  2 minutes 19 seconds Images submitted: 3 FINDINGS: Prior radiographs demonstrate 5 lumbar vertebra. Old pedicle screws and bars are seen at L4-L5 with intervening disc prosthesis. New RIGHT lateral plate and screws at K0-X3 post lateral lumbar fusion with intervening disc prosthesis at the L3-L4 disc space. Bones appear demineralized. Vertebral body heights maintained. Minimal retrolisthesis at L5-S1 on initial image. IMPRESSION: Lateral fusion L3-L4. Electronically Signed   By: Lavonia Dana M.D.   On: 09/29/2018 16:43   Dg C-arm 1-60 Min  Result Date: 09/29/2018 CLINICAL DATA:  RIGHT L3-L4 anterior lumbar interbody fusion with lateral plate EXAM: LUMBAR SPINE - 2-3 VIEW; DG C-ARM 61-120 MIN COMPARISON:  08/11/2015, 04/17/2018 FLUOROSCOPY TIME:  2 minutes 19 seconds Images submitted: 3 FINDINGS: Prior radiographs demonstrate 5 lumbar vertebra. Old pedicle screws and bars are seen at L4-L5 with  intervening disc prosthesis. New RIGHT lateral plate and screws at G1-W2 post lateral lumbar fusion with intervening disc prosthesis at the L3-L4 disc space. Bones appear demineralized. Vertebral body heights maintained. Minimal retrolisthesis at L5-S1 on initial image. IMPRESSION: Lateral fusion L3-L4. Electronically Signed   By: Lavonia Dana M.D.   On: 09/29/2018 16:43    Assessment/Plan: Doing well.  Discharge home.    LOS: 1 day    Peggyann Shoals, MD 09/30/2018, 7:32 AM

## 2018-09-30 NOTE — Anesthesia Postprocedure Evaluation (Signed)
Anesthesia Post Note  Patient: Leonard [name]  Procedure(s) Performed: Right Lumbar three-lumbar four Anterolateral lumbar interbody fusion with lateral plate (Right Spine Lumbar)     Patient location during evaluation: PACU Anesthesia Type: General Level of consciousness: awake and alert Pain management: pain level controlled Vital Signs Assessment: post-procedure vital signs reviewed and stable Respiratory status: spontaneous breathing, nonlabored ventilation, respiratory function stable and patient connected to nasal cannula oxygen Cardiovascular status: blood pressure returned to baseline and stable Postop Assessment: no apparent nausea or vomiting Anesthetic complications: no    Last Vitals:  Vitals:   09/30/18 0401 09/30/18 0738  BP:  125/81  Pulse: 89 80  Resp:  18  Temp:  36.7 C  SpO2: 91% 93%    Last Pain:  Vitals:   09/30/18 0738  TempSrc: Oral  PainSc:                  [name]

## 2018-09-30 NOTE — Evaluation (Addendum)
Occupational Therapy Evaluation Patient Details Name: Leonard [name] MRN: [id number] DOB: 1960/03/18 Today's Date: 09/30/2018    History of Present Illness Pt is a  58 y/o male s/p R L3-4 anterolateral lumbar fusion with lateral plate. PMH: L4-L5 fusion, neck surgeries, ETOH/opiate dependency, depression, anxiety.    Clinical Impression   Patient independent PTA, admitted for above and limited by problem list below including pain, impaired balance and decreased activity tolerance.  Patient educated on brace mgmt, back precautions, posture, ADL compensatory techniques, safety and recommendations.  Patient currently requires min assist for LB ADLs, min guard for transfers and in room mobility. Good recall of back precautions, but requires cueing throughout session for posture during transfers and mobility.  Patient reports he will have 24/7 support from spouse at dc, who can assist as needed.  Will follow acutely, but anticipate no further needs after dc.     Follow Up Recommendations  No OT follow up;Supervision/Assistance - 24 hour    Equipment Recommendations  Other (comment)(RW)    Recommendations for Other Services       Precautions / Restrictions Precautions Precautions: Back;Fall Precaution Booklet Issued: Yes (comment) Precaution Comments: reviewed with pt, requires cueing to adhere functionally Required Braces or Orthoses: Spinal Brace Spinal Brace: Lumbar corset;Applied in sitting position Restrictions Weight Bearing Restrictions: No      Mobility Bed Mobility Overal bed mobility: Needs Assistance Bed Mobility: Rolling;Sidelying to Sit;Sit to Sidelying Rolling: Supervision Sidelying to sit: Supervision     Sit to sidelying: Supervision General bed mobility comments: supervision for log roll technique  Transfers Overall transfer level: Needs assistance Equipment used: Rolling walker (2 wheeled) Transfers: Sit to/from Stand Sit to Stand: Min guard          General transfer comment: min guard for safety and balance, cueing for hand placement and posture     Balance Overall balance assessment: Needs assistance Sitting-balance support: No upper extremity supported;Feet supported Sitting balance-Leahy Scale: Good Sitting balance - Comments: cueing to avoid foward lean with pain    Standing balance support: Bilateral upper extremity supported;During functional activity;No upper extremity supported Standing balance-Leahy Scale: Poor Standing balance comment: able to stand statically without UE support, but reliant on UE support dynamically                           ADL either performed or assessed with clinical judgement   ADL Overall ADL's : Needs assistance/impaired     Grooming: Min guard;Standing Grooming Details (indicate cue type and reason): pt reliant on B UE support, educated on use of chair for increased safety, precaution adherance  Upper Body Bathing: Set up;Sitting   Lower Body Bathing: Min guard;Cueing for back precautions;Sit to/from stand Lower Body Bathing Details (indicate cue type and reason): figure 4 technique seated to reach feet, min guard sit to stand  Upper Body Dressing : Sitting;Supervision/safety Upper Body Dressing Details (indicate cue type and reason): min cueing for brace mgmt  Lower Body Dressing: Sit to/from stand;Minimal assistance Lower Body Dressing Details (indicate cue type and reason): figure 4 technique in sitting, cueing to dress R LE first, min guard sit to stand; cueing throughout to avoid forward lean Toilet Transfer: Min guard;Ambulation;RW;Comfort height toilet       Tub/ Shower Transfer: Walk-in shower;Min guard;Ambulation;Shower Technical sales engineer Details (indicate cue type and reason): simulated reverse step technique over threshold with min guard, min cueing for technique Functional mobility during ADLs: Min guard;Rolling walker  General ADL Comments:  reviewed brace mgmt and precautions in relation to mobility and ADLs     Vision         Perception     Praxis      Pertinent Vitals/Pain Pain Assessment: 0-10 Pain Score: 7  Pain Location: incisional  Pain Descriptors / Indicators: Discomfort;Grimacing;Operative site guarding Pain Intervention(s): Monitored during session;Repositioned     Hand Dominance     Extremity/Trunk Assessment Upper Extremity Assessment Upper Extremity Assessment: Overall WFL for tasks assessed   Lower Extremity Assessment Lower Extremity Assessment: Defer to PT evaluation   Cervical / Trunk Assessment Cervical / Trunk Assessment: Other exceptions Cervical / Trunk Exceptions: s/p spinal surgery   Communication Communication Communication: No difficulties   Cognition Arousal/Alertness: Awake/alert Behavior During Therapy: WFL for tasks assessed/performed Overall Cognitive Status: Within Functional Limits for tasks assessed                                     General Comments       Exercises     Shoulder Instructions      Home Living Family/patient expects to be discharged to:: Private residence Living Arrangements: Spouse/significant other Available Help at Discharge: Family;Available 24 hours/day Type of Home: House Home Access: Stairs to enter [employer] of Steps: 2 Entrance Stairs-Rails: (+ rail) Home Layout: One level     Bathroom Shower/Tub: Occupational psychologist: Handicapped height     Home Equipment: Hand held shower head;Shower seat - built in          Prior Functioning/Environment Level of Independence: Independent                 OT Problem List: Decreased activity tolerance;Impaired balance (sitting and/or standing);Decreased safety awareness;Decreased knowledge of use of DME or AE;Decreased knowledge of precautions;Pain      OT Treatment/Interventions: Self-care/ADL training;DME and/or AE instruction;Therapeutic  activities;Patient/family education;Balance training    OT Goals(Current goals can be found in the care plan section) Acute Rehab OT Goals Patient Stated Goal: home today  OT Goal Formulation: With patient Time For Goal Achievement: 10/14/18 Potential to Achieve Goals: Good  OT Frequency: Min 2X/week   Barriers to D/C:            Co-evaluation              AM-PAC OT "6 Clicks" Daily Activity     Outcome Measure Help from another person eating meals?: None Help from another person taking care of personal grooming?: A Little Help from another person toileting, which includes using toliet, bedpan, or urinal?: A Little Help from another person bathing (including washing, rinsing, drying)?: A Little Help from another person to put on and taking off regular upper body clothing?: A Little Help from another person to put on and taking off regular lower body clothing?: A Little 6 Click Score: 19   End of Session Equipment Utilized During Treatment: Rolling walker;Back brace Nurse Communication: Mobility status  Activity Tolerance: Patient tolerated treatment well Patient left: in bed;with call bell/phone within reach  OT Visit Diagnosis: Other abnormalities of gait and mobility (R26.89);Pain Pain - Right/Left: Right Pain - part of body: (incisional- R lateral back)                Time: 6962-9528 OT Time Calculation (min): 23 min Charges:  OT General Charges $OT Visit: 1 Visit OT Evaluation $OT Eval Moderate Complexity:  1 Mod OT Treatments $Self Care/Home Management : 8-22 mins  Delight Stare, OT Acute Rehabilitation Services Pager [phone] Office [phone]   Delight Stare 09/30/2018, 8:15 AM

## 2018-09-30 NOTE — Discharge Summary (Signed)
Physician Discharge Summary  Patient ID: Leonard [name] MRN: [id number] DOB/AGE: 59/09/1960 59 y.o.  Admit date: 09/29/2018 Discharge date: 09/30/2018  Admission Diagnoses:Lumbar stenosis, herniated disc, spondylolisthesis, lumbago, radiculopathy  Discharge Diagnoses: Lumbar stenosis, herniated disc, spondylolisthesis, lumbago, radiculopathy Active Problems:   Spondylolisthesis of lumbar region   Discharged Condition: good  Hospital Course: Patient underwent XLIF L 34 with lateral plate.  He did well with surgery, mobilized well and was discharged home on AM of POD 1.  Consults: None  Significant Diagnostic Studies: None  Treatments: surgery:  Patient underwent XLIF L 34 with lateral plate  Discharge Exam: Blood pressure 114/76, pulse 89, temperature 98.3 F (36.8 C), temperature source Oral, resp. rate 18, height 5' 9.5" (1.765 m), weight 88 kg, SpO2 91 %. Neurologic: Alert and oriented X 3, normal strength and tone. Normal symmetric reflexes. Normal coordination and gait Wound:CDI  Disposition: Home  Discharge Instructions    Diet - low sodium heart healthy   Complete by: As directed    Increase activity slowly   Complete by: As directed      Allergies as of 09/30/2018      Reactions   Sulfa Antibiotics Swelling, Rash, Other (See Comments)   Face swells, Welts   Zocor [simvastatin] Other (See Comments)   Elevated his liver enzymes      Medication List    TAKE these medications   aspirin 81 MG chewable tablet Chew 81 mg by mouth daily.   fluticasone 50 MCG/ACT nasal spray Commonly known as: FLONASE Place 2 sprays into both nostrils 2 (two) times daily.   ibuprofen 200 MG tablet Commonly known as: ADVIL Take 600 mg by mouth 4 (four) times daily as needed for headache or moderate pain.   methocarbamol 750 MG tablet Commonly known as: ROBAXIN Take 750 mg by mouth daily. What changed: Another medication with the same name was added. Make sure you understand  how and when to take each.   methocarbamol 750 MG tablet Commonly known as: ROBAXIN Take 1 tablet (750 mg total) by mouth every 6 (six) hours as needed for muscle spasms. What changed: You were already taking a medication with the same name, and this prescription was added. Make sure you understand how and when to take each.   multivitamin with minerals tablet Take 1 tablet by mouth daily. Centrum Silver   Opcon-A 0.027-0.315 % Soln Generic drug: Naphazoline-Pheniramine Place 1 drop into both eyes 3 (three) times daily as needed (for dry eyes).   oxyCODONE-acetaminophen 5-325 MG tablet Commonly known as: PERCOCET/ROXICET Take 1-2 tablets by mouth every 4 (four) hours as needed for moderate pain.   pregabalin 75 MG capsule Commonly known as: LYRICA Take 75 mg by mouth 2 (two) times daily.   rosuvastatin 20 MG tablet Commonly known as: CRESTOR Take 20 mg by mouth daily.   sertraline 50 MG tablet Commonly known as: ZOLOFT Take 75 mg by mouth 2 (two) times daily.   sildenafil 100 MG tablet Commonly known as: VIAGRA Take 100 mg by mouth daily as needed for erectile dysfunction.   traZODone 150 MG tablet Commonly known as: DESYREL Take 150 mg by mouth at bedtime.   Vitamin B-12 5000 MCG Subl Place 5,000 mcg under the tongue daily.   Vitamin D3 50 MCG (2000 UT) Tabs Take 2,000 Units by mouth daily.        Signed: Peggyann Shoals, MD 09/30/2018, 7:33 AM

## 2018-09-30 NOTE — Evaluation (Signed)
Physical Therapy Evaluation Patient Details Name: Leonard [name] MRN: [id number] DOB: 06/30/1960 Today's Date: 09/30/2018   History of Present Illness  Pt is a  58 y/o male s/p R L3-4 anterolateral lumbar fusion with lateral plate. PMH: L4-L5 fusion, neck surgeries, ETOH/opiate dependency, depression, anxiety.   Clinical Impression  Patient is s/p above surgery resulting in the deficits listed below (see PT Problem List). I have encouraged the patient to gradually increase activity daily to tolerance.  I have answered all patient's question regarding PT and mobility.   Pt feels ready for DC home today.  No f/u therapies recommended at this time.      Follow Up Recommendations No PT follow up;Supervision for mobility/OOB    Equipment Recommendations  Rolling walker with 5" wheels    Recommendations for Other Services       Precautions / Restrictions Precautions Precautions: Back;Fall Precaution Booklet Issued: Yes (comment) Precaution Comments: Able to verbalize 3/3 back precautions Required Braces or Orthoses: Spinal Brace Spinal Brace: Lumbar corset;Applied in sitting position(able to donn without assist) Restrictions Weight Bearing Restrictions: No      Mobility  Bed Mobility Overal bed mobility: (NT, pt sitting EOB. Can verbalize log rolling) Bed Mobility: Rolling;Sidelying to Sit;Sit to Sidelying Rolling: Supervision Sidelying to sit: Supervision     Sit to sidelying: Supervision General bed mobility comments: supervision for log roll technique  Transfers Overall transfer level: Needs assistance Equipment used: Rolling walker (2 wheeled) Transfers: Sit to/from Stand Sit to Stand: Supervision         General transfer comment: demonstrated safe technique  Ambulation/Gait Ambulation/Gait assistance: Supervision Gait Distance (Feet): 120 Feet Assistive device: Rolling walker (2 wheeled) Gait Pattern/deviations: Step-through pattern;Decreased stride  length Gait velocity: decreased from normal gait speed   General Gait Details: no balance losses. Cues for upright posture  Stairs Stairs: Yes Stairs assistance: Min guard Stair Management: One rail Right;Step to pattern;Forwards Number of Stairs: 2    Wheelchair Mobility    Modified Rankin (Stroke Patients Only)       Balance Overall balance assessment: Needs assistance Sitting-balance support: No upper extremity supported;Feet supported Sitting balance-Leahy Scale: Good Sitting balance - Comments: cueing to avoid foward lean with pain    Standing balance support: Bilateral upper extremity supported;During functional activity;No upper extremity supported Standing balance-Leahy Scale: Poor Standing balance comment: able to stand statically without UE support, but reliant on UE support dynamically                             Pertinent Vitals/Pain Pain Assessment: 0-10 Pain Score: 8  Pain Location: R buttocks and hip Pain Descriptors / Indicators: Discomfort;Grimacing Pain Intervention(s): Limited activity within patient's tolerance;Monitored during session    Home Living Family/patient expects to be discharged to:: Private residence Living Arrangements: Spouse/significant other Available Help at Discharge: Family;Available 24 hours/day Type of Home: House Home Access: Stairs to enter Entrance Stairs-Rails: Right Entrance Stairs-Number of Steps: 2 Home Layout: One level Home Equipment: Hand held shower head;Shower seat - built in      Prior Function Level of Independence: Independent(activity limited by back pain)               Hand Dominance   Dominant Hand: Right    Extremity/Trunk Assessment   Upper Extremity Assessment Upper Extremity Assessment: Overall WFL for tasks assessed    Lower Extremity Assessment Lower Extremity Assessment: Generalized weakness(Functional weakness with activity)    Cervical /  Trunk Assessment Cervical /  Trunk Assessment: Other exceptions Cervical / Trunk Exceptions: s/p spinal surgery  Communication   Communication: No difficulties  Cognition Arousal/Alertness: Awake/alert Behavior During Therapy: WFL for tasks assessed/performed Overall Cognitive Status: Within Functional Limits for tasks assessed                                        General Comments General comments (skin integrity, edema, etc.): No family present    Exercises     Assessment/Plan    PT Assessment Patent does not need any further PT services  PT Problem List         PT Treatment Interventions      PT Goals (Current goals can be found in the Care Plan section)  Acute Rehab PT Goals Patient Stated Goal: To go home after PT visit    Frequency     Barriers to discharge        Co-evaluation               AM-PAC PT "6 Clicks" Mobility  Outcome Measure Help needed turning from your back to your side while in a flat bed without using bedrails?: A Little Help needed moving from lying on your back to sitting on the side of a flat bed without using bedrails?: A Little Help needed moving to and from a bed to a chair (including a wheelchair)?: A Little Help needed standing up from a chair using your arms (e.g., wheelchair or bedside chair)?: A Little Help needed to walk in hospital room?: A Little Help needed climbing 3-5 steps with a railing? : A Little 6 Click Score: 18    End of Session Equipment Utilized During Treatment: Back brace;Gait belt Activity Tolerance: Patient tolerated treatment well Patient left: in bed;with call bell/phone within reach Nurse Communication: Mobility status PT Visit Diagnosis: Difficulty in walking, not elsewhere classified (R26.2)    Time: 1191-4782 PT Time Calculation (min) (ACUTE ONLY): 17 min   Charges:   PT Evaluation $PT Eval Low Complexity: [place], PT   Acute Rehabilitation Services  Pager [phone] Office  [phone] 09/30/2018   Melvern Banker 09/30/2018, 10:36 AM

## 2018-10-02 ENCOUNTER — Encounter (HOSPITAL_COMMUNITY): Payer: Self-pay | Admitting: Neurosurgery

## 2019-02-27 ENCOUNTER — Other Ambulatory Visit: Payer: Self-pay | Admitting: Gastroenterology

## 2019-02-27 DIAGNOSIS — K861 Other chronic pancreatitis: Secondary | ICD-10-CM

## 2019-02-27 DIAGNOSIS — R748 Abnormal levels of other serum enzymes: Secondary | ICD-10-CM

## 2019-02-27 DIAGNOSIS — R1084 Generalized abdominal pain: Secondary | ICD-10-CM

## 2019-02-27 DIAGNOSIS — K860 Alcohol-induced chronic pancreatitis: Secondary | ICD-10-CM

## 2019-02-28 ENCOUNTER — Ambulatory Visit
Admission: RE | Admit: 2019-02-28 | Discharge: 2019-02-28 | Disposition: A | Discharge status: Home / Self Care | Payer: PRIVATE HEALTH INSURANCE | Source: Ambulatory Visit | Attending: Gastroenterology | Admitting: Gastroenterology

## 2019-02-28 ENCOUNTER — Encounter (HOSPITAL_COMMUNITY): Payer: Self-pay | Admitting: Emergency Medicine

## 2019-02-28 ENCOUNTER — Inpatient Hospital Stay (HOSPITAL_COMMUNITY)
Admission: EM | Admit: 2019-02-28 | Discharge: 2019-03-08 | DRG: 439 | Disposition: A | Discharge status: Home / Self Care | Payer: PRIVATE HEALTH INSURANCE | Attending: Internal Medicine | Admitting: Internal Medicine

## 2019-02-28 ENCOUNTER — Other Ambulatory Visit: Payer: Self-pay

## 2019-02-28 DIAGNOSIS — K852 Alcohol induced acute pancreatitis without necrosis or infection: Secondary | ICD-10-CM | POA: Diagnosis present

## 2019-02-28 DIAGNOSIS — Z8261 Family history of arthritis: Secondary | ICD-10-CM

## 2019-02-28 DIAGNOSIS — Z888 Allergy status to other drugs, medicaments and biological substances status: Secondary | ICD-10-CM

## 2019-02-28 DIAGNOSIS — R1084 Generalized abdominal pain: Secondary | ICD-10-CM

## 2019-02-28 DIAGNOSIS — K863 Pseudocyst of pancreas: Secondary | ICD-10-CM | POA: Diagnosis present

## 2019-02-28 DIAGNOSIS — G8929 Other chronic pain: Secondary | ICD-10-CM | POA: Diagnosis present

## 2019-02-28 DIAGNOSIS — R17 Unspecified jaundice: Secondary | ICD-10-CM

## 2019-02-28 DIAGNOSIS — Z87891 Personal history of nicotine dependence: Secondary | ICD-10-CM

## 2019-02-28 DIAGNOSIS — Z79891 Long term (current) use of opiate analgesic: Secondary | ICD-10-CM

## 2019-02-28 DIAGNOSIS — F1020 Alcohol dependence, uncomplicated: Secondary | ICD-10-CM | POA: Diagnosis present

## 2019-02-28 DIAGNOSIS — Z981 Arthrodesis status: Secondary | ICD-10-CM

## 2019-02-28 DIAGNOSIS — K860 Alcohol-induced chronic pancreatitis: Principal | ICD-10-CM | POA: Diagnosis present

## 2019-02-28 DIAGNOSIS — Z20828 Contact with and (suspected) exposure to other viral communicable diseases: Secondary | ICD-10-CM | POA: Diagnosis present

## 2019-02-28 DIAGNOSIS — Z85828 Personal history of other malignant neoplasm of skin: Secondary | ICD-10-CM

## 2019-02-28 DIAGNOSIS — F1021 Alcohol dependence, in remission: Secondary | ICD-10-CM | POA: Diagnosis present

## 2019-02-28 DIAGNOSIS — R748 Abnormal levels of other serum enzymes: Secondary | ICD-10-CM

## 2019-02-28 DIAGNOSIS — I959 Hypotension, unspecified: Secondary | ICD-10-CM | POA: Diagnosis present

## 2019-02-28 DIAGNOSIS — R109 Unspecified abdominal pain: Secondary | ICD-10-CM

## 2019-02-28 DIAGNOSIS — M545 Low back pain: Secondary | ICD-10-CM | POA: Diagnosis present

## 2019-02-28 DIAGNOSIS — K861 Other chronic pancreatitis: Secondary | ICD-10-CM

## 2019-02-28 DIAGNOSIS — Z9049 Acquired absence of other specified parts of digestive tract: Secondary | ICD-10-CM

## 2019-02-28 DIAGNOSIS — Z882 Allergy status to sulfonamides status: Secondary | ICD-10-CM

## 2019-02-28 DIAGNOSIS — E785 Hyperlipidemia, unspecified: Secondary | ICD-10-CM | POA: Diagnosis present

## 2019-02-28 DIAGNOSIS — Z79899 Other long term (current) drug therapy: Secondary | ICD-10-CM

## 2019-02-28 DIAGNOSIS — D649 Anemia, unspecified: Secondary | ICD-10-CM | POA: Diagnosis present

## 2019-02-28 DIAGNOSIS — Z7982 Long term (current) use of aspirin: Secondary | ICD-10-CM

## 2019-02-28 DIAGNOSIS — R7401 Elevation of levels of liver transaminase levels: Secondary | ICD-10-CM

## 2019-02-28 DIAGNOSIS — R7989 Other specified abnormal findings of blood chemistry: Secondary | ICD-10-CM | POA: Diagnosis present

## 2019-02-28 DIAGNOSIS — K859 Acute pancreatitis without necrosis or infection, unspecified: Secondary | ICD-10-CM

## 2019-02-28 LAB — URINALYSIS, ROUTINE W REFLEX MICROSCOPIC
Bilirubin Urine: NEGATIVE
Glucose, UA: NEGATIVE mg/dL
Hgb urine dipstick: NEGATIVE
Ketones, ur: NEGATIVE mg/dL
Leukocytes,Ua: NEGATIVE
Nitrite: NEGATIVE
Protein, ur: NEGATIVE mg/dL
Specific Gravity, Urine: 1.032 — ABNORMAL HIGH (ref 1.005–1.030)
pH: 6 (ref 5.0–8.0)

## 2019-02-28 LAB — CBC
HCT: 39.9 % (ref 39.0–52.0)
Hemoglobin: 13 g/dL (ref 13.0–17.0)
MCH: 30.9 pg (ref 26.0–34.0)
MCHC: 32.6 g/dL (ref 30.0–36.0)
MCV: 94.8 fL (ref 80.0–100.0)
Platelets: 279 10*3/uL (ref 150–400)
RBC: 4.21 MIL/uL — ABNORMAL LOW (ref 4.22–5.81)
RDW: 13.8 % (ref 11.5–15.5)
WBC: 6.7 10*3/uL (ref 4.0–10.5)
nRBC: 0 % (ref 0.0–0.2)

## 2019-02-28 LAB — COMPREHENSIVE METABOLIC PANEL
ALT: 106 U/L — ABNORMAL HIGH (ref 0–44)
AST: 79 U/L — ABNORMAL HIGH (ref 15–41)
Albumin: 3.4 g/dL — ABNORMAL LOW (ref 3.5–5.0)
Alkaline Phosphatase: 640 U/L — ABNORMAL HIGH (ref 38–126)
Anion gap: 10 (ref 5–15)
BUN: 6 mg/dL (ref 6–20)
CO2: 24 mmol/L (ref 22–32)
Calcium: 9.3 mg/dL (ref 8.9–10.3)
Chloride: 102 mmol/L (ref 98–111)
Creatinine, Ser: 1.13 mg/dL (ref 0.61–1.24)
GFR calc Af Amer: >60 mL/min (ref >60)
GFR calc non Af Amer: >60 mL/min (ref >60)
Glucose, Bld: 134 mg/dL — ABNORMAL HIGH (ref 70–99)
Potassium: 3.5 mmol/L (ref 3.5–5.1)
Sodium: 136 mmol/L (ref 135–145)
Total Bilirubin: 5.8 mg/dL — ABNORMAL HIGH (ref 0.3–1.2)
Total Protein: 6.8 g/dL (ref 6.5–8.1)

## 2019-02-28 LAB — LIPASE, BLOOD: Lipase: 315 U/L — ABNORMAL HIGH (ref 11–51)

## 2019-02-28 MED ORDER — IOPAMIDOL (ISOVUE-300) INJECTION 61%
100.0000 mL | Freq: Once | INTRAVENOUS | Status: AC | PRN
  Administered 2019-02-28: 100 mL via INTRAVENOUS

## 2019-02-28 MED ORDER — SODIUM CHLORIDE 0.9% FLUSH: 3.0000 mL | Freq: Once | INTRAVENOUS | Status: DC

## 2019-02-28 NOTE — ED Triage Notes (Signed)
Pt states he has been having abd pain for 2 months and nausea. Pt had CT scan today confirming pancreatitis. Pt has hx of the same.

## 2019-03-01 DIAGNOSIS — K860 Alcohol-induced chronic pancreatitis: Secondary | ICD-10-CM | POA: Diagnosis present

## 2019-03-01 DIAGNOSIS — K861 Other chronic pancreatitis: Secondary | ICD-10-CM | POA: Diagnosis not present

## 2019-03-01 DIAGNOSIS — K863 Pseudocyst of pancreas: Secondary | ICD-10-CM | POA: Diagnosis present

## 2019-03-01 DIAGNOSIS — Z888 Allergy status to other drugs, medicaments and biological substances status: Secondary | ICD-10-CM | POA: Diagnosis not present

## 2019-03-01 DIAGNOSIS — E785 Hyperlipidemia, unspecified: Secondary | ICD-10-CM | POA: Diagnosis present

## 2019-03-01 DIAGNOSIS — K859 Acute pancreatitis without necrosis or infection, unspecified: Secondary | ICD-10-CM | POA: Diagnosis not present

## 2019-03-01 DIAGNOSIS — Z882 Allergy status to sulfonamides status: Secondary | ICD-10-CM | POA: Diagnosis not present

## 2019-03-01 DIAGNOSIS — Z79899 Other long term (current) drug therapy: Secondary | ICD-10-CM | POA: Diagnosis not present

## 2019-03-01 DIAGNOSIS — D649 Anemia, unspecified: Secondary | ICD-10-CM | POA: Diagnosis present

## 2019-03-01 DIAGNOSIS — Z20828 Contact with and (suspected) exposure to other viral communicable diseases: Secondary | ICD-10-CM | POA: Diagnosis present

## 2019-03-01 DIAGNOSIS — I959 Hypotension, unspecified: Secondary | ICD-10-CM | POA: Diagnosis present

## 2019-03-01 DIAGNOSIS — Z87891 Personal history of nicotine dependence: Secondary | ICD-10-CM | POA: Diagnosis not present

## 2019-03-01 DIAGNOSIS — Z85828 Personal history of other malignant neoplasm of skin: Secondary | ICD-10-CM | POA: Diagnosis not present

## 2019-03-01 DIAGNOSIS — Z79891 Long term (current) use of opiate analgesic: Secondary | ICD-10-CM | POA: Diagnosis not present

## 2019-03-01 DIAGNOSIS — Z7982 Long term (current) use of aspirin: Secondary | ICD-10-CM | POA: Diagnosis not present

## 2019-03-01 DIAGNOSIS — R7989 Other specified abnormal findings of blood chemistry: Secondary | ICD-10-CM

## 2019-03-01 DIAGNOSIS — Z8261 Family history of arthritis: Secondary | ICD-10-CM | POA: Diagnosis not present

## 2019-03-01 DIAGNOSIS — M545 Low back pain: Secondary | ICD-10-CM | POA: Diagnosis present

## 2019-03-01 DIAGNOSIS — F1021 Alcohol dependence, in remission: Secondary | ICD-10-CM | POA: Diagnosis present

## 2019-03-01 DIAGNOSIS — Z9049 Acquired absence of other specified parts of digestive tract: Secondary | ICD-10-CM | POA: Diagnosis not present

## 2019-03-01 DIAGNOSIS — Z981 Arthrodesis status: Secondary | ICD-10-CM | POA: Diagnosis not present

## 2019-03-01 DIAGNOSIS — R17 Unspecified jaundice: Secondary | ICD-10-CM | POA: Diagnosis present

## 2019-03-01 DIAGNOSIS — K852 Alcohol induced acute pancreatitis without necrosis or infection: Secondary | ICD-10-CM | POA: Diagnosis present

## 2019-03-01 DIAGNOSIS — G8929 Other chronic pain: Secondary | ICD-10-CM | POA: Diagnosis present

## 2019-03-01 LAB — COMPREHENSIVE METABOLIC PANEL
ALT: 97 U/L — ABNORMAL HIGH (ref 0–44)
AST: 81 U/L — ABNORMAL HIGH (ref 15–41)
Albumin: 3.2 g/dL — ABNORMAL LOW (ref 3.5–5.0)
Alkaline Phosphatase: 593 U/L — ABNORMAL HIGH (ref 38–126)
Anion gap: 12 (ref 5–15)
BUN: 6 mg/dL (ref 6–20)
CO2: 25 mmol/L (ref 22–32)
Calcium: 9.2 mg/dL (ref 8.9–10.3)
Chloride: 101 mmol/L (ref 98–111)
Creatinine, Ser: 1.08 mg/dL (ref 0.61–1.24)
GFR calc Af Amer: >60 mL/min (ref >60)
GFR calc non Af Amer: >60 mL/min (ref >60)
Glucose, Bld: 145 mg/dL — ABNORMAL HIGH (ref 70–99)
Potassium: 3.6 mmol/L (ref 3.5–5.1)
Sodium: 138 mmol/L (ref 135–145)
Total Bilirubin: 6.7 mg/dL — ABNORMAL HIGH (ref 0.3–1.2)
Total Protein: 6.7 g/dL (ref 6.5–8.1)

## 2019-03-01 LAB — HEPATIC FUNCTION PANEL
ALT: 97 U/L — ABNORMAL HIGH (ref 0–44)
AST: 83 U/L — ABNORMAL HIGH (ref 15–41)
Albumin: 3.1 g/dL — ABNORMAL LOW (ref 3.5–5.0)
Alkaline Phosphatase: 577 U/L — ABNORMAL HIGH (ref 38–126)
Bilirubin, Direct: 4.4 mg/dL — ABNORMAL HIGH (ref 0.0–0.2)
Indirect Bilirubin: 2.4 mg/dL — ABNORMAL HIGH (ref 0.3–0.9)
Total Bilirubin: 6.8 mg/dL — ABNORMAL HIGH (ref 0.3–1.2)
Total Protein: 6.6 g/dL (ref 6.5–8.1)

## 2019-03-01 LAB — MAGNESIUM: Magnesium: 2.2 mg/dL (ref 1.7–2.4)

## 2019-03-01 LAB — LACTATE DEHYDROGENASE: LDH: 156 U/L (ref 98–192)

## 2019-03-01 LAB — HIV ANTIBODY (ROUTINE TESTING W REFLEX): HIV Screen 4th Generation wRfx: NONREACTIVE

## 2019-03-01 LAB — PHOSPHORUS: Phosphorus: 3.7 mg/dL (ref 2.5–4.6)

## 2019-03-01 LAB — APTT: aPTT: 30 seconds (ref 24–36)

## 2019-03-01 LAB — PROTIME-INR
INR: 1.2 (ref 0.8–1.2)
Prothrombin Time: 14.8 seconds (ref 11.4–15.2)

## 2019-03-01 LAB — SARS CORONAVIRUS 2 (TAT 6-24 HRS): SARS Coronavirus 2: NEGATIVE

## 2019-03-01 MED ORDER — ROSUVASTATIN CALCIUM 20 MG PO TABS
20.0000 mg | ORAL_TABLET | Freq: Every day | ORAL | Status: DC
  Administered 2019-03-01 – 2019-03-07 (×7): 20 mg via ORAL
  Filled 2019-03-01 (×7): qty 1

## 2019-03-01 MED ORDER — TRAZODONE HCL 50 MG PO TABS
150.0000 mg | ORAL_TABLET | Freq: Every day | ORAL | Status: DC
  Administered 2019-03-01 – 2019-03-07 (×7): 150 mg via ORAL
  Filled 2019-03-01 (×7): qty 3

## 2019-03-01 MED ORDER — SODIUM CHLORIDE 0.9 % IV SOLN: INTRAVENOUS | Status: DC

## 2019-03-01 MED ORDER — VITAMIN D 25 MCG (1000 UNIT) PO TABS
2000.0000 [IU] | ORAL_TABLET | Freq: Every day | ORAL | Status: DC
  Administered 2019-03-01 – 2019-03-08 (×8): 2000 [IU] via ORAL
  Filled 2019-03-01 (×8): qty 2

## 2019-03-01 MED ORDER — ACETAMINOPHEN 650 MG RE SUPP: 650.0000 mg | Freq: Four times a day (QID) | RECTAL | Status: DC | PRN

## 2019-03-01 MED ORDER — ADULT MULTIVITAMIN W/MINERALS CH: 1.0000 | ORAL_TABLET | Freq: Every day | ORAL | Status: DC

## 2019-03-01 MED ORDER — PREGABALIN 75 MG PO CAPS
75.0000 mg | ORAL_CAPSULE | Freq: Every day | ORAL | Status: DC
  Administered 2019-03-01 – 2019-03-07 (×7): 75 mg via ORAL
  Filled 2019-03-01 (×7): qty 1

## 2019-03-01 MED ORDER — LORAZEPAM 1 MG PO TABS: 1.0000 mg | ORAL_TABLET | ORAL | Status: DC | PRN

## 2019-03-01 MED ORDER — ASPIRIN 81 MG PO CHEW
81.0000 mg | CHEWABLE_TABLET | Freq: Every day | ORAL | Status: DC
  Administered 2019-03-01 – 2019-03-08 (×8): 81 mg via ORAL
  Filled 2019-03-01 (×8): qty 1

## 2019-03-01 MED ORDER — LORAZEPAM 2 MG/ML IJ SOLN: 1.0000 mg | INTRAMUSCULAR | Status: DC | PRN

## 2019-03-01 MED ORDER — ONDANSETRON HCL 4 MG/2ML IJ SOLN
4.0000 mg | Freq: Four times a day (QID) | INTRAMUSCULAR | Status: DC | PRN
  Administered 2019-03-03 – 2019-03-07 (×3): 4 mg via INTRAVENOUS
  Filled 2019-03-01 (×4): qty 2

## 2019-03-01 MED ORDER — FOLIC ACID 1 MG PO TABS: 1.0000 mg | ORAL_TABLET | Freq: Every day | ORAL | Status: DC

## 2019-03-01 MED ORDER — THIAMINE HCL 100 MG PO TABS: 100.0000 mg | ORAL_TABLET | Freq: Every day | ORAL | Status: DC

## 2019-03-01 MED ORDER — MORPHINE SULFATE (PF) 2 MG/ML IV SOLN
2.0000 mg | INTRAVENOUS | Status: DC | PRN
  Administered 2019-03-01 (×4): 2 mg via INTRAVENOUS
  Filled 2019-03-01 (×4): qty 1

## 2019-03-01 MED ORDER — ONDANSETRON HCL 4 MG PO TABS
4.0000 mg | ORAL_TABLET | Freq: Four times a day (QID) | ORAL | Status: DC | PRN
  Administered 2019-03-05 – 2019-03-06 (×2): 4 mg via ORAL
  Filled 2019-03-01 (×2): qty 1

## 2019-03-01 MED ORDER — ACETAMINOPHEN 325 MG PO TABS
650.0000 mg | ORAL_TABLET | Freq: Four times a day (QID) | ORAL | Status: DC | PRN
  Administered 2019-03-03 – 2019-03-04 (×2): 650 mg via ORAL
  Filled 2019-03-01 (×2): qty 2

## 2019-03-01 MED ORDER — THIAMINE HCL 100 MG/ML IJ SOLN: 100.0000 mg | Freq: Every day | INTRAMUSCULAR | Status: DC

## 2019-03-01 MED ORDER — SERTRALINE HCL 50 MG PO TABS
75.0000 mg | ORAL_TABLET | Freq: Two times a day (BID) | ORAL | Status: DC
  Administered 2019-03-01 – 2019-03-08 (×15): 75 mg via ORAL
  Filled 2019-03-01 (×15): qty 1

## 2019-03-01 NOTE — Plan of Care (Signed)

## 2019-03-01 NOTE — Progress Notes (Signed)
[place] OF CARE NOTE Patient: Leonard [name] [id number]   PCP: Gaynelle Arabian, MD DOB: 09-24-1960   DOA: 02/28/2019   DOS: 03/01/2019    Patient was admitted by my colleague Dr. Alcario Drought earlier on 03/01/2019. I have reviewed the H&P as well as assessment and plan and agree with the same. Important changes in the plan are listed below.  Plan of care: Principal Problem:   Acute on chronic pancreatitis ([place]) Active Problems:   Elevated LFTs   Acute alcoholic pancreatitis   Chronic pancreatitis (HCC)   Alcohol dependence in remission ([place]) Acute pancreatitis on chronic pancreatitis with likely pseudocyst formation. Elevated LFT and bilirubin elevation due to pancreatitis and not due to choledocholithiasis. [facility] gastroenterology consulted. Appreciate assistance. Currently plan is to treat conservatively without any intervention. GI recommends p.o. trial Saturday if stable. GI will follow the patient peripherally. Patient mentions pain is well controlled on current regimen.  Author: Berle Mull, MD [facility] 03/01/2019 4:09 PM   If 7PM-7AM, please contact night-coverage at [email or web address]

## 2019-03-01 NOTE — H&P (Signed)
History and Physical    Leonard [name] XMI:[id number] DOB: 1960/10/06 DOA: 02/28/2019  PCP: Gaynelle Arabian, MD  Patient coming from: Home  I have personally briefly reviewed patient's old medical records in [place]  Chief Complaint: Acute pancreatitis  HPI: Leonard [name] is a 58 y.o. male with medical history significant of prior EtOH abuse, acute and chronic pancreatitis, follows with Dr. Michail Sermon GI.  Patient has been having abd pain and nausea for past 2 months.  Had CT scan as outpt today confirming pancreatitis.  Apparently Dr. Michail Sermon called patient and told him he needed to go in to ED for admission to hospital.   ED Course: Lipase 315, AST 79, alt 106, ALK 640.  CT scan findings documented in note below, but confirms acute on chronic pancreatitis with fluid collections.  Patient with nl WBC and no SIRS.   Review of Systems: As per HPI, otherwise all review of systems negative.  Past Medical History:  Diagnosis Date  . Abnormal EKG    anterolateral T wave inversion  . Anxiety   . Arthritis    "lower back, maybe some in my neck" (08/05/2017)  . 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 Chronic lower back pain    spondylolisthesis,spondylosis,DDD,stenosis  . Depression   . Dizziness   . Hyperlipidemia    was on meds but has been off x 5 months d/t elevated liver enzymes  . Hyperlipidemia    statin intolerant because of elevated liver function tests  . Iliac artery aneurysm, bilateral (HCC)    atable at 2.2 cm 07/28/17  . Insomnia    takes Trazodone nightly  . Numbness and tingling of both legs   . Opiate addiction ([place])   . Pancreatitis 08/03/2017  . PONV (postoperative nausea and vomiting)   . Skin cancer    "forehead; right posterior neck"  . Weakness    pain and occasionally tingling in right arm and leg    Past Surgical History:  Procedure Laterality Date  . ANTERIOR CERVICAL DECOMP/DISCECTOMY FUSION  2003, 2007   x 2  .  ANTERIOR CERVICAL DECOMP/DISCECTOMY FUSION N/A 10/13/2017   Procedure: Cervical Four-Five Cervical Five-Six Anterior cervical decompression/discectomy/fusion with exploration of Cervical Three-Four, Cervical Six-Seven fusions/possible removal of hardware;  Surgeon: Erline Levine, MD;  Location: [facility];  Service: Neurosurgery;  Laterality: N/A;  Cervical Four-Five Cervical Five-Six Anterior cervical decompression/discectomy/fusion with exploration of Cervical  . ANTERIOR LAT LUMBAR FUSION Right 09/29/2018   Procedure: Right Lumbar three-lumbar four Anterolateral lumbar interbody fusion with lateral plate;  Surgeon: Erline Levine, MD;  Location: [facility];  Service: Neurosurgery;  Laterality: Right;  . APPENDECTOMY  as a child  . BACK SURGERY    . COLONOSCOPY    . KNEE ARTHROSCOPY Right 2011  . LUMBAR LAMINECTOMY  2014   L4-5  . MOHS SURGERY     "forehead; right posterior neck"  . POSTERIOR LUMBAR FUSION  2015  . radial nerve impingement Right 2009   "near elbor"  . TIBIA FRACTURE SURGERY Right as a child   compound fracture     reports that he has quit smoking. His smoking use included cigarettes. He has a 1.00 pack-year smoking history. He quit smokeless tobacco use about 17 months ago.  His smokeless tobacco use included snuff. He reports previous alcohol use of about 84.0 standard drinks of alcohol per week. He reports previous drug use.  Allergies  Allergen Reactions  . Sulfa Antibiotics Swelling, Rash and  Other (See Comments)    Face swells, Welts  . Zocor [Simvastatin] Other (See Comments)    Elevated his liver enzymes    Family History  Problem Relation Age of Onset  . Arthritis Mother   . Cirrhosis Father   . Stroke Neg Hx      Prior to Admission medications   Medication Sig Start Date End Date Taking? Authorizing Provider  aspirin 81 MG chewable tablet Chew 81 mg by mouth daily.    Yes [provider]  Cholecalciferol (VITAMIN D3) 50 MCG (2000 UT) TABS Take 2,000  Units by mouth daily.   Yes [provider]  pregabalin (LYRICA) 75 MG capsule Take 75 mg by mouth at bedtime.    Yes [provider]  rosuvastatin (CRESTOR) 20 MG tablet Take 20 mg by mouth at bedtime.  04/26/18  Yes [provider]  sertraline (ZOLOFT) 50 MG tablet Take 75 mg by mouth 2 (two) times daily.    Yes [provider]  sildenafil (VIAGRA) 100 MG tablet Take 100 mg by mouth daily as needed for erectile dysfunction. 04/07/18  Yes [provider]  traZODone (DESYREL) 150 MG tablet Take 150 mg by mouth at bedtime.   Yes [provider]  methocarbamol (ROBAXIN) 750 MG tablet Take 1 tablet (750 mg total) by mouth every 6 (six) hours as needed for muscle spasms. Patient not taking: Reported on 03/01/2019 09/30/18   Erline Levine, MD  oxyCODONE-acetaminophen (PERCOCET/ROXICET) 5-325 MG tablet Take 1-2 tablets by mouth every 4 (four) hours as needed for moderate pain. Patient not taking: Reported on 03/01/2019 09/30/18   Erline Levine, MD    Physical Exam: Vitals:   02/28/19 2224 03/01/19 0200 03/01/19 0215 03/01/19 0220  BP: 129/81 131/85 136/89 136/89  Pulse: 85 72 74 73  Resp: '18 18 18   ' Temp: 98 F (36.7 C)     TempSrc: Oral     SpO2: 97% 97% 98%     Constitutional: NAD, calm, comfortable Eyes: PERRL, lids and conjunctivae normal ENMT: Mucous membranes are moist. Posterior pharynx clear of any exudate or lesions.Normal dentition.  Neck: normal, supple, no masses, no thyromegaly Respiratory: clear to auscultation bilaterally, no wheezing, no crackles. Normal respiratory effort. No accessory muscle use.  Cardiovascular: Regular rate and rhythm, no murmurs / rubs / gallops. No extremity edema. 2+ pedal pulses. No carotid bruits.  Abdomen: Epigastric TTP, no masses palpated. No hepatosplenomegaly. Bowel sounds positive.  Musculoskeletal: no clubbing / cyanosis. No joint deformity upper and lower extremities. Good ROM, no  contractures. Normal muscle tone.  Skin: no rashes, lesions, ulcers. No induration Neurologic: CN 2-12 grossly intact. Sensation intact, DTR normal. Strength 5/5 in all 4.  Psychiatric: Normal judgment and insight. Alert and oriented x 3. Normal mood.    Labs on Admission: I have personally reviewed following labs and imaging studies  CBC: Recent Labs  Lab 02/28/19 1614  WBC 6.7  HGB 13.0  HCT 39.9  MCV 94.8  PLT 161   Basic Metabolic Panel: Recent Labs  Lab 02/28/19 1614  NA 136  K 3.5  CL 102  CO2 24  GLUCOSE 134*  BUN 6  CREATININE 1.13  CALCIUM 9.3   GFR: CrCl cannot be calculated (Unknown ideal weight.). Liver Function Tests: Recent Labs  Lab 02/28/19 1614  AST 79*  ALT 106*  ALKPHOS 640*  BILITOT 5.8*  PROT 6.8  ALBUMIN 3.4*   Recent Labs  Lab 02/28/19 1614  LIPASE 315*   No  results for input(s): AMMONIA in the last 168 hours. Coagulation Profile: No results for input(s): INR, PROTIME in the last 168 hours. Cardiac Enzymes: No results for input(s): CKTOTAL, CKMB, CKMBINDEX, TROPONINI in the last 168 hours. BNP (last 3 results) No results for input(s): PROBNP in the last 8760 hours. HbA1C: No results for input(s): HGBA1C in the last 72 hours. CBG: No results for input(s): GLUCAP in the last 168 hours. Lipid Profile: No results for input(s): CHOL, HDL, LDLCALC, TRIG, CHOLHDL, LDLDIRECT in the last 72 hours. Thyroid Function Tests: No results for input(s): TSH, T4TOTAL, FREET4, T3FREE, THYROIDAB in the last 72 hours. Anemia Panel: No results for input(s): VITAMINB12, FOLATE, FERRITIN, TIBC, IRON, RETICCTPCT in the last 72 hours. Urine analysis:    Component Value Date/Time   COLORURINE AMBER (A) 02/28/2019 1603   APPEARANCEUR CLEAR 02/28/2019 1603   LABSPEC 1.032 (H) 02/28/2019 1603   PHURINE 6.0 02/28/2019 1603   GLUCOSEU NEGATIVE 02/28/2019 1603   HGBUR NEGATIVE 02/28/2019 1603   BILIRUBINUR NEGATIVE 02/28/2019 1603   KETONESUR NEGATIVE  02/28/2019 1603   PROTEINUR NEGATIVE 02/28/2019 1603   NITRITE NEGATIVE 02/28/2019 1603   LEUKOCYTESUR NEGATIVE 02/28/2019 1603    Radiological Exams on Admission: CT ABDOMEN PELVIS W CONTRAST  Result Date: 02/28/2019 CLINICAL DATA:  Acute on chronic pancreatitis.  Elevated lipase. EXAM: CT ABDOMEN AND PELVIS WITH CONTRAST TECHNIQUE: Multidetector CT imaging of the abdomen and pelvis was performed using the standard protocol following bolus administration of intravenous contrast. CONTRAST:  122m ISOVUE-300 IOPAMIDOL (ISOVUE-300) INJECTION 61% COMPARISON:  MRI abdomen 10/31/2017 FINDINGS: Lower chest: The lung bases are grossly clear. No pleural effusions or pulmonary lesions. The heart is normal in size. No pericardial effusion. Hepatobiliary: Moderate progressive intra and extrahepatic biliary dilatation when compared to the most recent MRI from 2019. No focal hepatic lesions. The portal and hepatic veins are patent. The gallbladder appears normal. Pancreas: Changes of chronic calcific pancreatitis are again demonstrated with small cystic lesions in the head and tail regions. Progressive dilatation of the main pancreatic duct in the body region with fairly abrupt cut off in the head. Similar findings involving the common bile duct which is dilated up to 14 mm and has a fairly abrupt cut off in the pancreatic head. I do not see an obvious pancreatic mass and this could be scarring changes related to chronic pancreatitis. There are also changes of acute pancreatitis mainly in the body and tail regions with surrounding phlegmonous inflammatory changes and adjacent fluid collections in the small bowel mesentery which could be developing pseudocysts or abscesses depending on the patient's clinical situation. There is also adjacent inflammation of the posterior wall of the stomach with inflammatory changes in the lesser sac. Small amount of fluid in the left anterior pararenal space. Numerous small but  slightly enlarged peripancreatic and mesenteric lymph nodes, likely inflammatory. Spleen: Normal size. No focal lesions. 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 Adrenals/Urinary Tract: The adrenal glands and kidneys are unremarkable. The bladder appears normal. Stomach/Bowel: Moderate inflammatory changes involving the posterior wall of the stomach. The duodenum and small bowel are unremarkable. No significant or acute colonic findings. Diffuse diverticulosis. Vascular/Lymphatic: Moderate atherosclerotic calcifications involving the aorta and iliac arteries. Mild tortuosity and ectasia of both iliac arteries. The branch vessels are patent. The major venous structures are patent. Numerous small mesenteric and retroperitoneal lymph nodes likely inflammatory. Reproductive: The prostate gland and seminal vesicles are unremarkable. Other: No pelvic mass or free pelvic fluid collections. No  pelvic or inguinal adenopathy. Musculoskeletal: No significant bony findings. Lumbar fusion hardware noted. IMPRESSION: 1. Acute on chronic pancreatitis as detailed above. Fluid collections in the small bowel mesentery could be developing pseudocysts or abscesses in the right clinical situation. 2. Significant and progressive biliary and pancreatic duct dilatation with fairly abrupt cutoffs in the pancreatic head. No obvious pancreatic head mass. Findings could be due to stricturing due to chronic pancreatitis, compounded by acute inflammation. 3. Stable appearing intrapancreatic chronic pseudocysts. 4. Numerous small mesenteric and retroperitoneal lymph nodes, likely inflammatory. 5. 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 Electronically Signed   By: Marijo Sanes M.D.   On: 02/28/2019 10:31    EKG: Independently reviewed.  Assessment/Plan Principal Problem:   Acute on chronic pancreatitis (HCC) Active Problems:   Elevated LFTs    Acute alcoholic pancreatitis   Chronic pancreatitis (HCC)   Alcohol dependence in remission ([place])    1. Acute on chronic pancreatitis - 1. CT scan showing what I suspect is pseudocyst formations 2. Abscesses seem less likely (patient with no SIRS). 3. Also ? Duct stricture. 4. IVF: NS at 125 cc/hr 5. Morphine PRN pain 6. Repeat CMP in AM 7. Call GI in AM for consult, 1. im assuming this patient may need ERCP to look at the apparent pancreatic duct obstruction indicated on CT?  Or maybe IR drainage? 8. NPO 2. EtOH dependence in remission - 1. Sober for over a year now per patient.  DVT prophylaxis: SCDs - in case ERCP needed, if not then start chemo PPx in AM Code Status: Full Family Communication: No family in room Disposition Plan: Home after admit Consults called: None, Call Dr. Michail Sermon, GI, in AM Admission status: Admit to inpatient  Severity of Illness: The appropriate patient status for this patient is INPATIENT. Inpatient status is judged to be reasonable and necessary in order to provide the required intensity of service to ensure the patient's safety. The patient's presenting symptoms, physical exam findings, and initial radiographic and laboratory data in the context of their chronic comorbidities is felt to place them at high risk for further clinical deterioration. Furthermore, it is not anticipated that the patient will be medically stable for discharge from the hospital within 2 midnights of admission. The following factors support the patient status of inpatient.   IP status with acute on chronic pancreatitis with pseudocyst formations.  Keeping NPO, I suspect he may need intervention by GI.  * I certify that at the point of admission it is my clinical judgment that the patient will require inpatient hospital care spanning beyond 2 midnights from the point of admission due to high intensity of service, high risk for further deterioration and high frequency of  surveillance required.*    Erykah Lippert M. DO [facility]  How to contact the [facility] Attending or Consulting provider [place] or covering provider during after hours [facility], for this patient?  1. Check the care team in [facility] and look for a) attending/consulting [facility] provider listed and b) the [facility] team listed 2. Log into [email or web address]  Amion Physician Scheduling and messaging for groups and whole hospitals  On call and physician scheduling software for group practices, residents, hospitalists and other medical providers for call, clinic, rotation and shift schedules. On[employer] is a hospital-wide system for scheduling doctors and paging doctors on call. EasyPlot is for scientific plotting and data analysis.  [email or web address]  and use [facility]'s universal password to access. If  you do not have the password, please contact the hospital operator.  3. Locate the [facility] provider you are looking for under [facility] and page to a number that you can be directly reached. 4. If you still have difficulty reaching the provider, please page the [facility] (Director on Call) for the Hospitalists listed on amion for assistance.  03/01/2019, 2:53 AM

## 2019-03-01 NOTE — Consult Note (Signed)
Referring Provider: Dr. Posey Pronto Primary Care Physician:  Gaynelle Arabian, MD Primary Gastroenterologist:  Dr. Michail Sermon  Reason for Consultation:  Acute pancreatitis  HPI: Leonard [name] is a 58 y.o. male with known alcohol-induced chronic pancreatitis (seen on EUS 2019) who reports abstinence from alcohol for the past 19 months. He has been having worsening abdominal pain this month that got very severe and sharp this past Sunday. Denies N/V. His Lipase rose to 678 last week and was 270 yesterday as an outpt and 315 on admit. TB 2.8, ALP 634, AST 221, ALT 188 on 02/21/19. LFTs on admit: TB 5.8, ALP 640, AST 79, ALT 106. Denies rectal bleeding, fevers, or chills. Outpt CT showed acute on chronic pancreatitis with question of pseudocysts developing and dilation of pancreatic and biliary ducts with abrupt cutoff in the pancreatic head. He has been on Creon as an outpt.  Past Medical History:  Diagnosis Date  . Abnormal EKG    anterolateral T wave inversion  . Anxiety   . Arthritis    "lower back, maybe some in my neck" (08/05/2017)  . 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 Chronic lower back pain    spondylolisthesis,spondylosis,DDD,stenosis  . Depression   . Dizziness   . Hyperlipidemia    was on meds but has been off x 5 months d/t elevated liver enzymes  . Hyperlipidemia    statin intolerant because of elevated liver function tests  . Iliac artery aneurysm, bilateral ([facility])    atable at 2.2 cm 07/28/17  . Insomnia    takes Trazodone nightly  . Numbness and tingling of both legs   . Opiate addiction ([place])   . Pancreatitis 08/03/2017  . PONV (postoperative nausea and vomiting)   . Skin cancer    "forehead; right posterior neck"  . Weakness    pain and occasionally tingling in right arm and leg    Past Surgical History:  Procedure Laterality Date  . ANTERIOR CERVICAL DECOMP/DISCECTOMY FUSION  2003, 2007   x 2  . ANTERIOR CERVICAL DECOMP/DISCECTOMY FUSION N/A 10/13/2017    Procedure: Cervical Four-Five Cervical Five-Six Anterior cervical decompression/discectomy/fusion with exploration of Cervical Three-Four, Cervical Six-Seven fusions/possible removal of hardware;  Surgeon: Erline Levine, MD;  Location: [facility];  Service: Neurosurgery;  Laterality: N/A;  Cervical Four-Five Cervical Five-Six Anterior cervical decompression/discectomy/fusion with exploration of Cervical  . ANTERIOR LAT LUMBAR FUSION Right 09/29/2018   Procedure: Right Lumbar three-lumbar four Anterolateral lumbar interbody fusion with lateral plate;  Surgeon: Erline Levine, MD;  Location: [facility];  Service: Neurosurgery;  Laterality: Right;  . APPENDECTOMY  as a child  . BACK SURGERY    . COLONOSCOPY    . KNEE ARTHROSCOPY Right 2011  . LUMBAR LAMINECTOMY  2014   L4-5  . MOHS SURGERY     "forehead; right posterior neck"  . POSTERIOR LUMBAR FUSION  2015  . radial nerve impingement Right 2009   "near elbor"  . TIBIA FRACTURE SURGERY Right as a child   compound fracture    Prior to Admission medications   Medication Sig Start Date End Date Taking? Authorizing Provider  aspirin 81 MG chewable tablet Chew 81 mg by mouth daily.    Yes [provider]  Cholecalciferol (VITAMIN D3) 50 MCG (2000 UT) TABS Take 2,000 Units by mouth daily.   Yes [provider]  pregabalin (LYRICA) 75 MG capsule Take 75 mg by mouth at bedtime.    Yes [provider]  rosuvastatin (CRESTOR)  20 MG tablet Take 20 mg by mouth at bedtime.  04/26/18  Yes [provider]  sertraline (ZOLOFT) 50 MG tablet Take 75 mg by mouth 2 (two) times daily.    Yes [provider]  sildenafil (VIAGRA) 100 MG tablet Take 100 mg by mouth daily as needed for erectile dysfunction. 04/07/18  Yes [provider]  traZODone (DESYREL) 150 MG tablet Take 150 mg by mouth at bedtime.   Yes [provider]    Scheduled Meds: . aspirin  81 mg Oral Daily  . cholecalciferol  2,000 Units Oral Daily   . pregabalin  75 mg Oral QHS  . rosuvastatin  20 mg Oral QHS  . sertraline  75 mg Oral BID  . sodium chloride flush  3 mL Intravenous Once  . traZODone  150 mg Oral QHS   Continuous Infusions: . sodium chloride 125 mL/hr at 03/01/19 0209   PRN Meds:.acetaminophen **OR** acetaminophen, morphine injection, ondansetron **OR** ondansetron (ZOFRAN) IV  Allergies as of 02/28/2019 - Review Complete 02/28/2019  Allergen Reaction Noted  . Sulfa antibiotics Swelling, Rash, and Other (See Comments) 08/10/2011  . Zocor [simvastatin] Other (See Comments) 08/04/2017    Family History  Problem Relation Age of Onset  . Arthritis Mother   . Cirrhosis Father   . Stroke Neg Hx     Social History   Socioeconomic History  . Marital status: Married    Spouse name: Not on file  . Number of children: 0  . Years of education: 12  . Highest education level: Not on file  Occupational History  . Occupation: [employer]  Tobacco Use  . Smoking status: Former Smoker    Packs/day: 0.50    Years: 2.00    Pack years: 1.00    Types: Cigarettes  . Smokeless tobacco: Former Systems developer    Types: Snuff    Quit date: 09/14/2017  . Tobacco comment: smoked while he was in high school;   Substance and Sexual Activity  . Alcohol use: Not Currently    Alcohol/week: 84.0 standard drinks    Types: 84 Glasses of wine per week    Comment: 08/05/2017 "2 bottles of red wine/night", 09/26/2018 not currently drinking alcohol  . Drug use: Not Currently  . Sexual activity: Yes  Other Topics Concern  . Not on file  Social History Narrative   ** Merged History Encounter **   Lives at home w/ his wife   Right-handed   Caffeine: 3 cups coffee per day   Social Determinants of Health   Financial Resource Strain:   . Difficulty of Paying Living Expenses: Not on file  Food Insecurity:   . Worried About Charity fundraiser in the Last Year: Not on file  . Ran Out of Food in the Last Year: Not on file   Transportation Needs:   . Lack of Transportation (Medical): Not on file  . Lack of Transportation (Non-Medical): Not on file  Physical Activity:   . Days of Exercise per Week: Not on file  . Minutes of Exercise per Session: Not on file  Stress:   . Feeling of Stress : Not on file  Social Connections:   . Frequency of Communication with Friends and Family: Not on file  . Frequency of Social Gatherings with Friends and Family: Not on file  . Attends Religious Services: Not on file  . Active Member of Clubs or Organizations: Not on file  . Attends Archivist Meetings:  Not on file  . Marital Status: Not on file  Intimate Partner Violence:   . Fear of Current or Ex-Partner: Not on file  . Emotionally Abused: Not on file  . Physically Abused: Not on file  . Sexually Abused: Not on file    Review of Systems: All negative except as stated above in HPI.  Physical Exam: Vital signs: Vitals:   03/01/19 0807 03/01/19 1027  BP: 120/72 120/72  Pulse: (!) 59 (!) 59  Resp: 17 17  Temp: 97.7 F (36.5 C) (!) 97.5 F (36.4 C)  SpO2: 95%    Last BM Date: 02/27/19 General:   Lethargic, Well-developed, well-nourished, pleasant and cooperative in NAD Head: normocephalic, atraumatic Eyes: +icteric sclera ENT: oropharynx clear Neck: supple, nontender Lungs:  Clear throughout to auscultation.   No wheezes, crackles, or rhonchi. No acute distress. Heart:  Regular rate and rhythm; no murmurs, clicks, rubs,  or gallops. Abdomen: Epigastric and RUQ tenderness with guarding, soft, nondistended, +BS Rectal:  Deferred Ext: no edema  GI:  Lab Results: Recent Labs    02/28/19 1614  WBC 6.7  HGB 13.0  HCT 39.9  PLT 279   BMET Recent Labs    02/28/19 1614 03/01/19 0200  NA 136 138  K 3.5 3.6  CL 102 101  CO2 24 25  GLUCOSE 134* 145*  BUN 6 6  CREATININE 1.13 1.08  CALCIUM 9.3 9.2   LFT Recent Labs    03/01/19 0200  PROT 6.6  6.7  ALBUMIN 3.1*  3.2*  AST 83*   81*  ALT 97*  97*  ALKPHOS 577*  593*  BILITOT 6.8*  6.7*  BILIDIR 4.4*  IBILI 2.4*   PT/INR Recent Labs    03/01/19 0200  LABPROT 14.8  INR 1.2     Studies/Results: CT ABDOMEN PELVIS W CONTRAST  Result Date: 02/28/2019 CLINICAL DATA:  Acute on chronic pancreatitis.  Elevated lipase. EXAM: CT ABDOMEN AND PELVIS WITH CONTRAST TECHNIQUE: Multidetector CT imaging of the abdomen and pelvis was performed using the standard protocol following bolus administration of intravenous contrast. CONTRAST:  180mL ISOVUE-300 IOPAMIDOL (ISOVUE-300) INJECTION 61% COMPARISON:  MRI abdomen 10/31/2017 FINDINGS: Lower chest: The lung bases are grossly clear. No pleural effusions or pulmonary lesions. The heart is normal in size. No pericardial effusion. Hepatobiliary: Moderate progressive intra and extrahepatic biliary dilatation when compared to the most recent MRI from 2019. No focal hepatic lesions. The portal and hepatic veins are patent. The gallbladder appears normal. Pancreas: Changes of chronic calcific pancreatitis are again demonstrated with small cystic lesions in the head and tail regions. Progressive dilatation of the main pancreatic duct in the body region with fairly abrupt cut off in the head. Similar findings involving the common bile duct which is dilated up to 14 mm and has a fairly abrupt cut off in the pancreatic head. I do not see an obvious pancreatic mass and this could be scarring changes related to chronic pancreatitis. There are also changes of acute pancreatitis mainly in the body and tail regions with surrounding phlegmonous inflammatory changes and adjacent fluid collections in the small bowel mesentery which could be developing pseudocysts or abscesses depending on the patient's clinical situation. There is also adjacent inflammation of the posterior wall of the stomach with inflammatory changes in the lesser sac. Small amount of fluid in the left anterior pararenal space. Numerous  small but slightly enlarged peripancreatic and mesenteric lymph nodes, likely inflammatory. Spleen: Normal size. No focal lesions. 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 Adrenals/Urinary Tract: The adrenal glands and kidneys are unremarkable. The bladder appears normal. Stomach/Bowel: Moderate inflammatory changes involving the posterior wall of the stomach. The duodenum and small bowel are unremarkable. No significant or acute colonic findings. Diffuse diverticulosis. Vascular/Lymphatic: Moderate atherosclerotic calcifications involving the aorta and iliac arteries. Mild tortuosity and ectasia of both iliac arteries. The branch vessels are patent. The major venous structures are patent. Numerous small mesenteric and retroperitoneal lymph nodes likely inflammatory. Reproductive: The prostate gland and seminal vesicles are unremarkable. Other: No pelvic mass or free pelvic fluid collections. No pelvic or inguinal adenopathy. Musculoskeletal: No significant bony findings. Lumbar fusion hardware noted. IMPRESSION: 1. Acute on chronic pancreatitis as detailed above. Fluid collections in the small bowel mesentery could be developing pseudocysts or abscesses in the right clinical situation. 2. Significant and progressive biliary and pancreatic duct dilatation with fairly abrupt cutoffs in the pancreatic head. No obvious pancreatic head mass. Findings could be due to stricturing due to chronic pancreatitis, compounded by acute inflammation. 3. Stable appearing intrapancreatic chronic pseudocysts. 4. Numerous small mesenteric and retroperitoneal lymph nodes, likely inflammatory. 5. 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 Electronically Signed   By: Marijo Sanes M.D.   On: 02/28/2019 10:31    Impression/Plan: Acute on chronic pancreatitis with likely pseudocysts formations. Suspect elevated LFTs and biliary dilation due to  pancreatitis and not due to choledocholithiasis. Continue IVFs and bowel rest. Supportive care. Consider PO trial Saturday if stable. Resume pancreatic enzymes when back on PO nutrition. Dr. Watt Climes on call tomorrow and this weekend and he will f/u 03/03/19 unless urgent need arises tomorrow please let him know.    LOS: 0 days   Lear Ng  03/01/2019, 11:03 AM  Questions please call [phone]

## 2019-03-01 NOTE — Plan of Care (Signed)

## 2019-03-01 NOTE — ED Provider Notes (Signed)
[facility] EMERGENCY DEPARTMENT Provider Note   CSN: [id number] Arrival date & time: 02/28/19  1555     History Chief Complaint  Patient presents with  . Pancreatitis    Leonard [name] is a 58 y.o. male.  HPI     58 year old male comes in a chief complaint of pancreatitis. Patient has known history of chronic pancreatitis.  He used to drink heavily but has stopped drinking for the last 2 years.  He reports that for the last couple of months he has been having intermittent episodes of abdominal pain.  At its peak the pain is severe and there is radiation to the back.  He has no known history of peptic ulcer disease.  He saw his GI doctors and they have been managing him as an outpatient.  Today he had a CT scan completed which showed concerning findings, and he was advised to come to the ER for admission by his GI.  Patient reports that his urine has turned dark over the last few days and his wife is noted skin change over the same duration.  Past Medical History:  Diagnosis Date  . Abnormal EKG    anterolateral T wave inversion  . Anxiety   . Arthritis    "lower back, maybe some in my neck" (08/05/2017)  . 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 Chronic lower back pain    spondylolisthesis,spondylosis,DDD,stenosis  . Depression   . Dizziness   . Hyperlipidemia    was on meds but has been off x 5 months d/t elevated liver enzymes  . Hyperlipidemia    statin intolerant because of elevated liver function tests  . Iliac artery aneurysm, bilateral (HCC)    atable at 2.2 cm 07/28/17  . Insomnia    takes Trazodone nightly  . Numbness and tingling of both legs   . Opiate addiction ([place])   . Pancreatitis 08/03/2017  . PONV (postoperative nausea and vomiting)   . Skin cancer    "forehead; right posterior neck"  . Weakness    pain and occasionally tingling in right arm and leg    Patient Active Problem List   Diagnosis Date Noted  . Chronic  pancreatitis ([place]) 03/01/2019  . Alcohol dependence in remission ([place]) 03/01/2019  . Acute on chronic pancreatitis ([place]) 03/01/2019  . Cervical stenosis of spinal canal 10/13/2017  . Acute pancreatitis 08/04/2017  . Elevated LFTs 08/04/2017  . Carotid artery dissection ([place]) 08/04/2017  . Chronic neck pain 08/04/2017  . Acute alcoholic pancreatitis [id number]/8299  . Hay fever 07/18/2015  . Anxiety state 07/18/2015  . Fatty infiltration of liver 07/18/2015  . Insomnia 07/18/2015  . Anxiety and depression 04/02/2015  . Spondylolisthesis of lumbar region 09/10/2013  . Hyperlipidemia 09/05/2013  . Opiate dependence ([place]) 08/20/2013    Past Surgical History:  Procedure Laterality Date  . ANTERIOR CERVICAL DECOMP/DISCECTOMY FUSION  2003, 2007   x 2  . ANTERIOR CERVICAL DECOMP/DISCECTOMY FUSION N/A 10/13/2017   Procedure: Cervical Four-Five Cervical Five-Six Anterior cervical decompression/discectomy/fusion with exploration of Cervical Three-Four, Cervical Six-Seven fusions/possible removal of hardware;  Surgeon: Erline Levine, MD;  Location: [facility];  Service: Neurosurgery;  Laterality: N/A;  Cervical Four-Five Cervical Five-Six Anterior cervical decompression/discectomy/fusion with exploration of Cervical  . ANTERIOR LAT LUMBAR FUSION Right 09/29/2018   Procedure: Right Lumbar three-lumbar four Anterolateral lumbar interbody fusion with lateral plate;  Surgeon: Erline Levine, MD;  Location: [facility];  Service: Neurosurgery;  Laterality: Right;  .  APPENDECTOMY  as a child  . BACK SURGERY    . COLONOSCOPY    . KNEE ARTHROSCOPY Right 2011  . LUMBAR LAMINECTOMY  2014   L4-5  . MOHS SURGERY     "forehead; right posterior neck"  . POSTERIOR LUMBAR FUSION  2015  . radial nerve impingement Right 2009   "near elbor"  . TIBIA FRACTURE SURGERY Right as a child   compound fracture       Family History  Problem Relation Age of Onset  . Arthritis Mother   . Cirrhosis Father   . Stroke Neg Hx      Social History   Tobacco Use  . Smoking status: Former Smoker    Packs/day: 0.50    Years: 2.00    Pack years: 1.00    Types: Cigarettes  . Smokeless tobacco: Former Systems developer    Types: Snuff    Quit date: 09/14/2017  . Tobacco comment: smoked while he was in high school;   Substance Use Topics  . Alcohol use: Not Currently    Alcohol/week: 84.0 standard drinks    Types: 84 Glasses of wine per week    Comment: 08/05/2017 "2 bottles of red wine/night", 09/26/2018 not currently drinking alcohol  . Drug use: Not Currently    Home Medications Prior to Admission medications   Medication Sig Start Date End Date Taking? Authorizing Provider  aspirin 81 MG chewable tablet Chew 81 mg by mouth daily.    Yes [provider]  Cholecalciferol (VITAMIN D3) 50 MCG (2000 UT) TABS Take 2,000 Units by mouth daily.   Yes [provider]  pregabalin (LYRICA) 75 MG capsule Take 75 mg by mouth at bedtime.    Yes [provider]  rosuvastatin (CRESTOR) 20 MG tablet Take 20 mg by mouth at bedtime.  04/26/18  Yes [provider]  sertraline (ZOLOFT) 50 MG tablet Take 75 mg by mouth 2 (two) times daily.    Yes [provider]  sildenafil (VIAGRA) 100 MG tablet Take 100 mg by mouth daily as needed for erectile dysfunction. 04/07/18  Yes [provider]  traZODone (DESYREL) 150 MG tablet Take 150 mg by mouth at bedtime.   Yes [provider]    Allergies    Sulfa antibiotics and Zocor [simvastatin]  Review of Systems   Review of Systems  Constitutional: Positive for activity change.  Gastrointestinal: Positive for abdominal pain.  All other systems reviewed and are negative.   Physical Exam Updated Vital Signs BP 136/89   Pulse 73   Temp 98 F (36.7 C) (Oral)   Resp 18   SpO2 98%   Physical Exam Vitals and nursing note reviewed.  Constitutional:      Appearance: He is well-developed.  HENT:     Head: Atraumatic.  Eyes:      General: Scleral icterus present.  Cardiovascular:     Rate and Rhythm: Normal rate.  Pulmonary:     Effort: Pulmonary effort is normal.  Abdominal:     Tenderness: There is abdominal tenderness. There is guarding. There is no rebound.  Musculoskeletal:     Cervical back: Neck supple.  Skin:    General: Skin is warm.     Coloration: Skin is jaundiced.  Neurological:     Mental Status: He is alert and oriented to person, place, and time.     ED Results / Procedures / Treatments   Labs (all labs ordered are listed, but only abnormal results are displayed)  Labs Reviewed  LIPASE, BLOOD - Abnormal; Notable for the following components:      Result Value   Lipase 315 (*)    All other components within normal limits  COMPREHENSIVE METABOLIC PANEL - Abnormal; Notable for the following components:   Glucose, Bld 134 (*)    Albumin 3.4 (*)    AST 79 (*)    ALT 106 (*)    Alkaline Phosphatase 640 (*)    Total Bilirubin 5.8 (*)    All other components within normal limits  CBC - Abnormal; Notable for the following components:   RBC 4.21 (*)    All other components within normal limits  URINALYSIS, ROUTINE W REFLEX MICROSCOPIC - Abnormal; Notable for the following components:   Color, Urine AMBER (*)    Specific Gravity, Urine 1.032 (*)    All other components within normal limits  HEPATIC FUNCTION PANEL - Abnormal; Notable for the following components:   Albumin 3.1 (*)    AST 83 (*)    ALT 97 (*)    Alkaline Phosphatase 577 (*)    Total Bilirubin 6.8 (*)    Bilirubin, Direct 4.4 (*)    Indirect Bilirubin 2.4 (*)    All other components within normal limits  COMPREHENSIVE METABOLIC PANEL - Abnormal; Notable for the following components:   Glucose, Bld 145 (*)    Albumin 3.2 (*)    AST 81 (*)    ALT 97 (*)    Alkaline Phosphatase 593 (*)    Total Bilirubin 6.7 (*)    All other components within normal limits  SARS CORONAVIRUS 2 (TAT 6-24 HRS)  PROTIME-INR  APTT  LACTATE  DEHYDROGENASE  MAGNESIUM  PHOSPHORUS  HIV ANTIBODY (ROUTINE TESTING W REFLEX)    EKG None  Radiology CT ABDOMEN PELVIS W CONTRAST  Result Date: 02/28/2019 CLINICAL DATA:  Acute on chronic pancreatitis.  Elevated lipase. EXAM: CT ABDOMEN AND PELVIS WITH CONTRAST TECHNIQUE: Multidetector CT imaging of the abdomen and pelvis was performed using the standard protocol following bolus administration of intravenous contrast. CONTRAST:  100mL ISOVUE-300 IOPAMIDOL (ISOVUE-300) INJECTION 61% COMPARISON:  MRI abdomen 10/31/2017 FINDINGS: Lower chest: The lung bases are grossly clear. No pleural effusions or pulmonary lesions. The heart is normal in size. No pericardial effusion. Hepatobiliary: Moderate progressive intra and extrahepatic biliary dilatation when compared to the most recent MRI from 2019. No focal hepatic lesions. The portal and hepatic veins are patent. The gallbladder appears normal. Pancreas: Changes of chronic calcific pancreatitis are again demonstrated with small cystic lesions in the head and tail regions. Progressive dilatation of the main pancreatic duct in the body region with fairly abrupt cut off in the head. Similar findings involving the common bile duct which is dilated up to 14 mm and has a fairly abrupt cut off in the pancreatic head. I do not see an obvious pancreatic mass and this could be scarring changes related to chronic pancreatitis. There are also changes of acute pancreatitis mainly in the body and tail regions with surrounding phlegmonous inflammatory changes and adjacent fluid collections in the small bowel mesentery which could be developing pseudocysts or abscesses depending on the patient's clinical situation. There is also adjacent inflammation of the posterior wall of the stomach with inflammatory changes in the lesser sac. Small amount of fluid in the left anterior pararenal space. Numerous small but slightly enlarged peripancreatic and mesenteric lymph nodes, likely  inflammatory. Spleen: Normal size. No focal lesions. 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 Adrenals/Urinary Tract: The adrenal glands and kidneys are unremarkable. The bladder appears normal. Stomach/Bowel: Moderate inflammatory changes involving the posterior wall of the stomach. The duodenum and small bowel are unremarkable. No significant or acute colonic findings. Diffuse diverticulosis. Vascular/Lymphatic: Moderate atherosclerotic calcifications involving the aorta and iliac arteries. Mild tortuosity and ectasia of both iliac arteries. The branch vessels are patent. The major venous structures are patent. Numerous small mesenteric and retroperitoneal lymph nodes likely inflammatory. Reproductive: The prostate gland and seminal vesicles are unremarkable. Other: No pelvic mass or free pelvic fluid collections. No pelvic or inguinal adenopathy. Musculoskeletal: No significant bony findings. Lumbar fusion hardware noted. IMPRESSION: 1. Acute on chronic pancreatitis as detailed above. Fluid collections in the small bowel mesentery could be developing pseudocysts or abscesses in the right clinical situation. 2. Significant and progressive biliary and pancreatic duct dilatation with fairly abrupt cutoffs in the pancreatic head. No obvious pancreatic head mass. Findings could be due to stricturing due to chronic pancreatitis, compounded by acute inflammation. 3. Stable appearing intrapancreatic chronic pseudocysts. 4. Numerous small mesenteric and retroperitoneal lymph nodes, likely inflammatory. 5. 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 Electronically Signed   By: Marijo Sanes M.D.   On: 02/28/2019 10:31    Procedures .Critical Care Performed by: Varney Biles, MD Authorized by: Varney Biles, MD   Critical care provider statement:    Critical care time (minutes):  32   Critical care was necessary to  treat or prevent imminent or life-threatening deterioration of the following conditions:  Hepatic failure   Critical care was time spent personally by me on the following activities:  Discussions with consultants, evaluation of patient's response to treatment, examination of patient, ordering and performing treatments and interventions, ordering and review of laboratory studies, ordering and review of radiographic studies, pulse oximetry, re-evaluation of patient's condition, obtaining history from patient or surrogate and review of old charts   (including critical care time)  Medications Ordered in ED Medications  sodium chloride flush (NS) 0.9 % injection 3 mL (has no administration in time range)  morphine 2 MG/ML injection 2-4 mg (2 mg Intravenous Given 03/01/19 0214)  0.9 %  sodium chloride infusion ( Intravenous New Bag/Given 03/01/19 0209)  acetaminophen (TYLENOL) tablet 650 mg (has no administration in time range)    Or  acetaminophen (TYLENOL) suppository 650 mg (has no administration in time range)  ondansetron (ZOFRAN) tablet 4 mg (has no administration in time range)    Or  ondansetron (ZOFRAN) injection 4 mg (has no administration in time range)  traZODone (DESYREL) tablet 150 mg (has no administration in time range)  sertraline (ZOLOFT) tablet 75 mg (has no administration in time range)  rosuvastatin (CRESTOR) tablet 20 mg (has no administration in time range)  pregabalin (LYRICA) capsule 75 mg (has no administration in time range)  aspirin chewable tablet 81 mg (has no administration in time range)  cholecalciferol (VITAMIN D3) tablet 2,000 Units (has no administration in time range)    ED Course  I have reviewed the triage vital signs and the nursing notes.  Pertinent labs & imaging results that were available during my care of the patient were reviewed by me and considered in my medical decision making (see chart for details).    MDM Rules/Calculators/A&P                        58 year old male with history of chronic pancreatitis comes in a  chief complaint of abnormal CT scan.  He is noted to have epigastric and right upper quadrant tenderness.  CT scan reviewed and it does not reveal any evidence of gallstones.  Labs reviewed and he has elevated transaminase and alk phos.  Bilirubin is over 5.  Patient has quit drinking for the last 2 years.  Unclear what the etiology is.  We will send hepatitis panel.  He likely will need MRCP or ERCP.  Patient will be admitted to the hospital.  INR, LDH sent.  I have requested the hospitalist team to call GI for urgent evaluation, instead of a call from the ER in the middle the night. They agreed.  Final Clinical Impression(s) / ED Diagnoses Final diagnoses:  Jaundice  Acute pancreatitis, unspecified complication status, unspecified pancreatitis type  Elevated transaminase level    Rx / DC Orders ED Discharge Orders    None       , , MD 03/01/19 0403  

## 2019-03-02 LAB — LIPASE, BLOOD: Lipase: 180 U/L — ABNORMAL HIGH (ref 11–51)

## 2019-03-02 LAB — BASIC METABOLIC PANEL
Anion gap: 11 (ref 5–15)
BUN: 8 mg/dL (ref 6–20)
CO2: 24 mmol/L (ref 22–32)
Calcium: 8.8 mg/dL — ABNORMAL LOW (ref 8.9–10.3)
Chloride: 106 mmol/L (ref 98–111)
Creatinine, Ser: 1.02 mg/dL (ref 0.61–1.24)
GFR calc Af Amer: >60 mL/min (ref >60)
GFR calc non Af Amer: >60 mL/min (ref >60)
Glucose, Bld: 72 mg/dL (ref 70–99)
Potassium: 3.9 mmol/L (ref 3.5–5.1)
Sodium: 141 mmol/L (ref 135–145)

## 2019-03-02 LAB — CBC
HCT: 34.4 % — ABNORMAL LOW (ref 39.0–52.0)
Hemoglobin: 11.6 g/dL — ABNORMAL LOW (ref 13.0–17.0)
MCH: 31.2 pg (ref 26.0–34.0)
MCHC: 33.7 g/dL (ref 30.0–36.0)
MCV: 92.5 fL (ref 80.0–100.0)
Platelets: 222 10*3/uL (ref 150–400)
RBC: 3.72 MIL/uL — ABNORMAL LOW (ref 4.22–5.81)
RDW: 13.8 % (ref 11.5–15.5)
WBC: 6.4 10*3/uL (ref 4.0–10.5)
nRBC: 0 % (ref 0.0–0.2)

## 2019-03-02 LAB — MAGNESIUM: Magnesium: 2.1 mg/dL (ref 1.7–2.4)

## 2019-03-02 MED ORDER — OXYCODONE HCL 5 MG PO TABS
5.0000 mg | ORAL_TABLET | ORAL | Status: DC | PRN
  Administered 2019-03-02 – 2019-03-07 (×14): 5 mg via ORAL
  Filled 2019-03-02 (×15): qty 1

## 2019-03-02 MED ORDER — ENOXAPARIN SODIUM 40 MG/0.4ML ~~LOC~~ SOLN
40.0000 mg | SUBCUTANEOUS | Status: DC
  Administered 2019-03-02 – 2019-03-07 (×6): 40 mg via SUBCUTANEOUS
  Filled 2019-03-02 (×6): qty 0.4

## 2019-03-02 MED ORDER — MORPHINE SULFATE (PF) 2 MG/ML IV SOLN
2.0000 mg | INTRAVENOUS | Status: DC | PRN
  Administered 2019-03-02 – 2019-03-07 (×15): 2 mg via INTRAVENOUS
  Filled 2019-03-02 (×15): qty 1

## 2019-03-02 MED ORDER — POLYETHYLENE GLYCOL 3350 17 G PO PACK
17.0000 g | PACK | Freq: Every day | ORAL | Status: DC
  Administered 2019-03-03 – 2019-03-08 (×6): 17 g via ORAL
  Filled 2019-03-02 (×7): qty 1

## 2019-03-02 NOTE — Progress Notes (Signed)
[facility] Progress Note  Patient: Leonard [name] [id number]   PCP: Gaynelle Arabian, MD DOB: 11/29/1960   DOA: 02/28/2019   DOS: 03/02/2019   Date of Service: the patient was seen and examined on 03/02/2019  Chief Complaint  Patient presents with  . Pancreatitis   Brief hospital course: JAHSAI PENNACHIO is a 58 y.o. male with medical history significant of prior EtOH abuse, acute and chronic pancreatitis, follows with Dr. Michail Sermon GI.  Patient has been having abd pain and nausea for past 2 months.  Had CT scan as outpt today confirming pancreatitis.  Apparently Dr. Michail Sermon called patient and told him he needed to go in to ED for admission to hospital.  Currently further plan is continue pain control and monitor response.  Subjective: Reports 5 out of 10 pain in the abdominal area.  No nausea no vomiting.  Passing gas but no BM.  No fever no chills.  No shortness of breath.  Assessment and Plan: Scheduled Meds: . aspirin  81 mg Oral Daily  . cholecalciferol  2,000 Units Oral Daily  . polyethylene glycol  17 g Oral Daily  . pregabalin  75 mg Oral QHS  . rosuvastatin  20 mg Oral QHS  . sertraline  75 mg Oral BID  . sodium chloride flush  3 mL Intravenous Once  . traZODone  150 mg Oral QHS   Continuous Infusions: . sodium chloride 125 mL/hr at 03/02/19 1227   PRN Meds: acetaminophen **OR** acetaminophen, morphine injection, ondansetron **OR** ondansetron (ZOFRAN) IV, oxyCODONE  1. Acute on chronic pancreatitis with likely pseudocyst formation. Elevated LFT and bilirubin elevation due to pancreatitis and not due to choledocholithiasis. [facility] gastroenterology consulted. Appreciate assistance. Currently plan is to treat conservatively without any intervention. GI will follow the patient peripherally. Currently on IV morphine.  I will add oxycodone. I will also add bowel regimen. Patient is encouraged to ambulate with assistance. Continue IV hydration for now.  Continue IV Zofran.  2.  Transaminitis Monitor for now. GI recommends conservative measures.  3.  Numerous small mesenteric and retroperitoneal lymphadenopathy likely inflammatory. Monitor.  4.  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  Monitor for now.  5.  Carotid artery dissection. History. Monitor.  6.  History of alcohol abuse. Used to drink heavily in the past. He has not drank any alcohol since July 2020.  Diet: NPO except ice chips and sips of water DVT Prophylaxis: Subcutaneous Lovenox   Advance goals of care discussion: Full code  Family Communication: family was present at bedside, at the time of interview. The pt provided permission to discuss medical plan with the family. Opportunity was given to ask question and all questions were answered satisfactorily.   Disposition:  Discharge to home likely Sunday.  Consultants: Gastroenterology/[facility] Procedures: None  Antibiotics: Anti-infectives (From admission, onward)   None       Objective: Physical Exam: Vitals:   03/01/19 2117 03/02/19 0409 03/02/19 0753 03/02/19 1424  BP: 125/83 110/68 117/67 110/70  Pulse: 64 63 67 68  Resp: 18 18 18 18   Temp: 98.5 F (36.9 C) 98.7 F (37.1 C) 97.8 F (36.6 C) 98.4 F (36.9 C)  TempSrc: Oral Oral Oral Oral  SpO2: 95% 94% 97% 98%  Weight:      Height:        Intake/Output Summary (Last 24 hours) at 03/02/2019 1503 Last data filed at 03/02/2019 1227 Gross per 24 hour  Intake 4083.07 ml  Output -  Net  4083.07 ml   Filed Weights   03/01/19 1027  Weight: 84 kg   General: alert and oriented to time, place, and person. Appear in mild distress, affect appropriate Eyes: PERRL, Conjunctiva normal ENT: Oral Mucosa Clear, dry  Neck: no JVD, no Abnormal Mass Or lumps Cardiovascular: S1 and S2 Present, no Murmur,  Respiratory: good respiratory effort, Bilateral Air entry equal and Decreased, no signs of accessory muscle use, Clear to  Auscultation, no Crackles, no wheezes Abdomen: Bowel Sound present, Soft and no tenderness, no hernia Skin: no rashes  Extremities: no Pedal edema, no calf tenderness Neurologic: without any new focal findings Gait not checked due to patient safety concerns  Data Reviewed: I have personally reviewed and interpreted daily labs, tele strips, imagings as discussed above. I reviewed all nursing notes, pharmacy notes, vitals, pertinent old records I have discussed plan of care as described above with RN and patient/family.  CBC: Recent Labs  Lab 02/28/19 1614 03/02/19 0338  WBC 6.7 6.4  HGB 13.0 11.6*  HCT 39.9 34.4*  MCV 94.8 92.5  PLT 279 [id number]   Basic Metabolic Panel: Recent Labs  Lab 02/28/19 1614 03/01/19 0200 03/01/19 0213 03/02/19 0338  NA 136 138  --  141  K 3.5 3.6  --  3.9  CL 102 101  --  106  CO2 24 25  --  24  GLUCOSE 134* 145*  --  72  BUN 6 6  --  8  CREATININE 1.13 1.08  --  1.02  CALCIUM 9.3 9.2  --  8.8*  MG  --   --  2.2 2.1  PHOS  --   --  3.7  --     Liver Function Tests: Recent Labs  Lab 02/28/19 1614 03/01/19 0200  AST 79* 83*  81*  ALT 106* 97*  97*  ALKPHOS 640* 577*  593*  BILITOT 5.8* 6.8*  6.7*  PROT 6.8 6.6  6.7  ALBUMIN 3.4* 3.1*  3.2*   Recent Labs  Lab 02/28/19 1614 03/02/19 0338  LIPASE 315* 180*   No results for input(s): AMMONIA in the last 168 hours. Coagulation Profile: Recent Labs  Lab 03/01/19 0200  INR 1.2   Cardiac Enzymes: No results for input(s): CKTOTAL, CKMB, CKMBINDEX, TROPONINI in the last 168 hours. BNP (last 3 results) No results for input(s): PROBNP in the last 8760 hours. CBG: No results for input(s): GLUCAP in the last 168 hours. Studies: No results found.   Time spent: 35 minutes  Author: Berle Mull, MD [facility] 03/02/2019 3:03 PM  To reach On-call, see care teams to locate the attending and reach out to them via [email or web address]. If 7PM-7AM, please contact night-coverage If  you still have difficulty reaching the attending provider, please page the [facility] (Director on Call) for [facility] on amion for assistance.

## 2019-03-02 NOTE — Plan of Care (Signed)

## 2019-03-02 NOTE — Plan of Care (Signed)
  Problem: Education: Goal: Knowledge of General Education information will improve Description: Including pain rating scale, medication(s)/side effects and non-pharmacologic comfort measures Outcome: Adequate for Discharge   

## 2019-03-03 ENCOUNTER — Inpatient Hospital Stay (HOSPITAL_COMMUNITY): Payer: PRIVATE HEALTH INSURANCE

## 2019-03-03 LAB — BASIC METABOLIC PANEL
Anion gap: 14 (ref 5–15)
BUN: 7 mg/dL (ref 6–20)
CO2: 22 mmol/L (ref 22–32)
Calcium: 8.7 mg/dL — ABNORMAL LOW (ref 8.9–10.3)
Chloride: 102 mmol/L (ref 98–111)
Creatinine, Ser: 1.13 mg/dL (ref 0.61–1.24)
GFR calc Af Amer: >60 mL/min (ref >60)
GFR calc non Af Amer: >60 mL/min (ref >60)
Glucose, Bld: 101 mg/dL — ABNORMAL HIGH (ref 70–99)
Potassium: 4.3 mmol/L (ref 3.5–5.1)
Sodium: 138 mmol/L (ref 135–145)

## 2019-03-03 LAB — HEPATIC FUNCTION PANEL
ALT: 63 U/L — ABNORMAL HIGH (ref 0–44)
AST: 49 U/L — ABNORMAL HIGH (ref 15–41)
Albumin: 2.8 g/dL — ABNORMAL LOW (ref 3.5–5.0)
Alkaline Phosphatase: 496 U/L — ABNORMAL HIGH (ref 38–126)
Bilirubin, Direct: 3 mg/dL — ABNORMAL HIGH (ref 0.0–0.2)
Indirect Bilirubin: 2.5 mg/dL — ABNORMAL HIGH (ref 0.3–0.9)
Total Bilirubin: 5.5 mg/dL — ABNORMAL HIGH (ref 0.3–1.2)
Total Protein: 6 g/dL — ABNORMAL LOW (ref 6.5–8.1)

## 2019-03-03 LAB — CBC
HCT: 35.6 % — ABNORMAL LOW (ref 39.0–52.0)
Hemoglobin: 11.6 g/dL — ABNORMAL LOW (ref 13.0–17.0)
MCH: 30.4 pg (ref 26.0–34.0)
MCHC: 32.6 g/dL (ref 30.0–36.0)
MCV: 93.4 fL (ref 80.0–100.0)
Platelets: 224 10*3/uL (ref 150–400)
RBC: 3.81 MIL/uL — ABNORMAL LOW (ref 4.22–5.81)
RDW: 13.9 % (ref 11.5–15.5)
WBC: 8.9 10*3/uL (ref 4.0–10.5)
nRBC: 0 % (ref 0.0–0.2)

## 2019-03-03 MED ORDER — PANCRELIPASE (LIP-PROT-AMYL) 36000-114000 UNITS PO CPEP
36000.0000 [IU] | ORAL_CAPSULE | Freq: Four times a day (QID) | ORAL | Status: DC
  Administered 2019-03-03 – 2019-03-06 (×11): 36000 [IU] via ORAL
  Filled 2019-03-03 (×15): qty 1

## 2019-03-03 MED ORDER — GADOBUTROL 1 MMOL/ML IV SOLN
8.0000 mL | Freq: Once | INTRAVENOUS | Status: AC | PRN
  Administered 2019-03-03: 8 mL via INTRAVENOUS

## 2019-03-03 NOTE — Progress Notes (Signed)
Patient to MRI.

## 2019-03-03 NOTE — Progress Notes (Signed)
[facility] Progress Note  Patient: Leonard [name] [id number]   PCP: Gaynelle Arabian, MD DOB: Jan 19, 1961   DOA: 02/28/2019   DOS: 03/03/2019   Date of Service: the patient was seen and examined on 03/03/2019  Chief Complaint  Patient presents with  . Pancreatitis   Brief hospital course: NEWELL OLANDER is a 58 y.o. male with medical history significant of prior EtOH abuse, acute and chronic pancreatitis, follows with Dr. Michail Sermon GI.  Patient has been having abd pain and nausea for past 2 months.  Had CT scan as outpt today confirming pancreatitis.  Apparently Dr. Michail Sermon called patient and told him he needed to go in to ED for admission to hospital.  Currently further plan is continue pain control and monitor response.  Subjective: Pain actually is reported as worse than yesterday.  No fever no chills no chest pain.  No nausea no vomiting.  Passing gas.  Assessment and Plan: Scheduled Meds: . aspirin  81 mg Oral Daily  . cholecalciferol  2,000 Units Oral Daily  . enoxaparin (LOVENOX) injection  40 mg Subcutaneous Q24H  . lipase/protease/amylase  36,000 Units Oral QID  . polyethylene glycol  17 g Oral Daily  . pregabalin  75 mg Oral QHS  . rosuvastatin  20 mg Oral QHS  . sertraline  75 mg Oral BID  . sodium chloride flush  3 mL Intravenous Once  . traZODone  150 mg Oral QHS   Continuous Infusions: . sodium chloride 75 mL/hr at 03/03/19 0804   PRN Meds: acetaminophen **OR** acetaminophen, morphine injection, ondansetron **OR** ondansetron (ZOFRAN) IV, oxyCODONE  1. Acute on chronic pancreatitis with likely pseudocyst formation. Elevated LFT and bilirubin elevation due to pancreatitis and not due to choledocholithiasis. [facility] gastroenterology consulted. Appreciate assistance. Currently plan is to treat conservatively without any intervention. GI will follow the patient peripherally. Currently on IV morphine.  Continue oxycodone. Continue IV fluids. [facility]  recommends MRCP. We will monitor the results. Patient was transitioned to clear liquid diet but unable to tolerate and therefore backup NPO.  2.  Transaminitis Monitor for now. GI recommends conservative measures.  3.  Numerous small mesenteric and retroperitoneal lymphadenopathy likely inflammatory. Monitor.  4.  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  Monitor for now.  5.  Carotid artery dissection. History. Monitor.  6.  History of alcohol abuse. Used to drink heavily in the past. He has not drank any alcohol since July 2020.  Diet: NPO except ice chips and sips of water DVT Prophylaxis: Subcutaneous Lovenox   Advance goals of care discussion: Full code  Family Communication: family was present at bedside, at the time of interview. The pt provided permission to discuss medical plan with the family. Opportunity was given to ask question and all questions were answered satisfactorily.   Disposition:  Discharge to home likely Sunday.  Consultants: Gastroenterology/[facility] Procedures: None  Antibiotics: Anti-infectives (From admission, onward)   None       Objective: Physical Exam: Vitals:   03/03/19 0310 03/03/19 0813 03/03/19 1312 03/03/19 2005  BP: (!) 124/58 128/81 117/77 110/65  Pulse: 77 84 72 84  Resp: 16 16 16    Temp: 98.7 F (37.1 C) 98.8 F (37.1 C) 98.8 F (37.1 C) 99.2 F (37.3 C)  TempSrc: Oral Oral Oral Oral  SpO2: 93% 94% 95% 92%  Weight:      Height:        Intake/Output Summary (Last 24 hours) at 03/03/2019 2114 Last data filed  at 03/03/2019 [id number] Gross per 24 hour  Intake 0 ml  Output 1 ml  Net -1 ml   Filed Weights   03/01/19 1027  Weight: 84 kg   General: alert and oriented to time, place, and person. Appear in mild distress, affect appropriate Eyes: PERRL, Conjunctiva normal ENT: Oral Mucosa Clear, dry  Neck: no JVD, no Abnormal Mass Or lumps Cardiovascular: S1 and S2 Present, no Murmur,  Respiratory:  good respiratory effort, Bilateral Air entry equal and Decreased, no signs of accessory muscle use, Clear to Auscultation, no Crackles, no wheezes Abdomen: Bowel Sound present, Soft and no tenderness, no hernia Skin: no rashes  Extremities: no Pedal edema, no calf tenderness Neurologic: without any new focal findings Gait not checked due to patient safety concerns  Data Reviewed: I have personally reviewed and interpreted daily labs, tele strips, imagings as discussed above. I reviewed all nursing notes, pharmacy notes, vitals, pertinent old records I have discussed plan of care as described above with RN and patient/family.  CBC: Recent Labs  Lab 02/28/19 1614 03/02/19 0338 03/03/19 0704  WBC 6.7 6.4 8.9  HGB 13.0 11.6* 11.6*  HCT 39.9 34.4* 35.6*  MCV 94.8 92.5 93.4  PLT 279 222 [id number]   Basic Metabolic Panel: Recent Labs  Lab 02/28/19 1614 03/01/19 0200 03/01/19 0213 03/02/19 0338 03/03/19 0704  NA 136 138  --  141 138  K 3.5 3.6  --  3.9 4.3  CL 102 101  --  106 102  CO2 24 25  --  24 22  GLUCOSE 134* 145*  --  72 101*  BUN 6 6  --  8 7  CREATININE 1.13 1.08  --  1.02 1.13  CALCIUM 9.3 9.2  --  8.8* 8.7*  MG  --   --  2.2 2.1  --   PHOS  --   --  3.7  --   --     Liver Function Tests: Recent Labs  Lab 02/28/19 1614 03/01/19 0200 03/03/19 0704  AST 79* 83*  81* 49*  ALT 106* 97*  97* 63*  ALKPHOS 640* 577*  593* 496*  BILITOT 5.8* 6.8*  6.7* 5.5*  PROT 6.8 6.6  6.7 6.0*  ALBUMIN 3.4* 3.1*  3.2* 2.8*   Recent Labs  Lab 02/28/19 1614 03/02/19 0338  LIPASE 315* 180*   No results for input(s): AMMONIA in the last 168 hours. Coagulation Profile: Recent Labs  Lab 03/01/19 0200  INR 1.2   Cardiac Enzymes: No results for input(s): CKTOTAL, CKMB, CKMBINDEX, TROPONINI in the last 168 hours. BNP (last 3 results) No results for input(s): PROBNP in the last 8760 hours. CBG: No results for input(s): GLUCAP in the last 168 hours. Studies: MR 3D Recon  At Scanner  Result Date: 03/03/2019 CLINICAL DATA:  Abdominal pain with nausea for 2 months. History of ethanol abuse with acute and chronic pancreatitis. Elevated transaminase levels, bilirubin and lipase. Biliary and pancreatic ductal dilatation on CT. EXAM: MRI ABDOMEN WITHOUT AND WITH CONTRAST (INCLUDING MRCP) TECHNIQUE: Multiplanar multisequence MR imaging of the abdomen was performed both before and after the administration of intravenous contrast. Heavily T2-weighted images of the biliary and pancreatic ducts were obtained, and three-dimensional MRCP images were rendered by post processing. CONTRAST:  39mL GADAVIST GADOBUTROL 1 MMOL/ML IV SOLN COMPARISON:  Abdominal CT 02/28/2019. Abdominal MRI 10/31/2017. FINDINGS: Lower chest: Mild atelectasis at both lung bases. No significant pleural effusion. Hepatobiliary: No significant hepatic steatosis. No focal hepatic lesion or abnormal enhancement  following contrast. There is no evidence of gallstones or gallbladder wall thickening. Moderate intrahepatic and extrahepatic biliary dilatation is present as seen on recent CT. This has progressed compared with previous MRI. The common hepatic duct measures up to 14 mm in diameter. No evidence of choledocholithiasis. There is a low insertion of the cystic duct. Pancreas: In correlation with previous studies, including the recent CT, there is evidence of acute on chronic pancreatitis. There is progressive dilatation of the main pancreatic duct within the pancreatic body and tail to 8 mm. There are multiple parenchymal calcifications throughout the pancreas on recent CT, especially in the uncinate process. On this study, there are 2 rounded areas of decreased enhancement inferiorly in the pancreatic tail; these are surrounded by calcifications on the recent CT. No pancreatic head mass identified. Extensive peripancreatic inflammatory changes are present as seen on recent CT. There are small ill-defined fluid collections  within the pancreatic tail. There is a complex peripherally enhancing fluid collection within the gastrocolic ligament measuring up to 7.9 x 2.4 cm on image 33/5. This is similar to the recent CT. The pancreas enhances homogeneously following contrast, without evidence of necrosis. Spleen: Normal in size without focal abnormality. Adrenals/Urinary Tract: Both adrenal glands appear normal. Both kidneys appear normal without hydronephrosis or mass lesion. Stomach/Bowel: Circumferential wall thickening of the distal stomach attributed to the adjacent pancreatic inflammation. No bowel wall thickening, distention or surrounding inflammatory changes. Vascular/Lymphatic: Multiple small lymph nodes within the base of the mesentery are stable. There is no retroperitoneal lymphadenopathy. No acute vascular findings are seen. There is aortic and branch vessel atherosclerosis. The portal and superior mesenteric veins are patent. There is chronic occlusion of the splenic vein with associated chronic collaterals. Other: Inflammatory changes around the pancreas, extending into the mesenteric fat and left anterior pararenal space. No ascites. Musculoskeletal: No acute or significant osseous findings. Previous lower lumbar fusion. IMPRESSION: 1. Findings are most consistent with acute on chronic pancreatitis without significant change from CT of 3 days ago. There are inflammatory changes and ill-defined fluid collections within and surrounding the pancreas. No evidence of pancreatic necrosis. 2. Progressive biliary and pancreatic ductal dilatation compared with 2019 MRI. No evidence of choledocholithiasis or distinct pancreatic mass. These findings are likely due to pancreatic stricture with superimposed inflammation. Follow-up imaging with MRI or EUS after resolution of the acute episode recommended. 3. Stable wall thickening of the distal stomach attributed to pancreatic inflammation. 4. Chronic splenic vein occlusion with chronic  collaterals. Electronically Signed   By: Richardean Sale M.D.   On: 03/03/2019 19:28   DG Abd Portable 1V  Result Date: 03/03/2019 CLINICAL DATA:  58 year old male with abdominal pain. Acute on chronic pancreatitis. EXAM: PORTABLE ABDOMEN - 1 VIEW COMPARISON:  CT Abdomen and Pelvis 02/28/2019. FINDINGS: Portable AP views of the abdomen and pelvis at 1305 hours. Non obstructed bowel gas pattern. Epigastric pancreatic parenchymal calcifications were better demonstrated by CT. Otherwise abdominal and pelvic visceral contours are within normal limits by x-ray; right hemipelvis phlebolith again noted. Stable visualized osseous structures. Previous lumbar fusion. Lung bases not included. IMPRESSION: 1. Normal bowel gas pattern. 2. Dystrophic pancreatic calcifications.  As seen recently by CT. Electronically Signed   By: Genevie Ann M.D.   On: 03/03/2019 13:58   MR ABDOMEN MRCP W WO CONTAST  Result Date: 03/03/2019 CLINICAL DATA:  Abdominal pain with nausea for 2 months. History of ethanol abuse with acute and chronic pancreatitis. Elevated transaminase levels, bilirubin and lipase. Biliary and pancreatic  ductal dilatation on CT. EXAM: MRI ABDOMEN WITHOUT AND WITH CONTRAST (INCLUDING MRCP) TECHNIQUE: Multiplanar multisequence MR imaging of the abdomen was performed both before and after the administration of intravenous contrast. Heavily T2-weighted images of the biliary and pancreatic ducts were obtained, and three-dimensional MRCP images were rendered by post processing. CONTRAST:  85mL GADAVIST GADOBUTROL 1 MMOL/ML IV SOLN COMPARISON:  Abdominal CT 02/28/2019. Abdominal MRI 10/31/2017. FINDINGS: Lower chest: Mild atelectasis at both lung bases. No significant pleural effusion. Hepatobiliary: No significant hepatic steatosis. No focal hepatic lesion or abnormal enhancement following contrast. There is no evidence of gallstones or gallbladder wall thickening. Moderate intrahepatic and extrahepatic biliary dilatation  is present as seen on recent CT. This has progressed compared with previous MRI. The common hepatic duct measures up to 14 mm in diameter. No evidence of choledocholithiasis. There is a low insertion of the cystic duct. Pancreas: In correlation with previous studies, including the recent CT, there is evidence of acute on chronic pancreatitis. There is progressive dilatation of the main pancreatic duct within the pancreatic body and tail to 8 mm. There are multiple parenchymal calcifications throughout the pancreas on recent CT, especially in the uncinate process. On this study, there are 2 rounded areas of decreased enhancement inferiorly in the pancreatic tail; these are surrounded by calcifications on the recent CT. No pancreatic head mass identified. Extensive peripancreatic inflammatory changes are present as seen on recent CT. There are small ill-defined fluid collections within the pancreatic tail. There is a complex peripherally enhancing fluid collection within the gastrocolic ligament measuring up to 7.9 x 2.4 cm on image 33/5. This is similar to the recent CT. The pancreas enhances homogeneously following contrast, without evidence of necrosis. Spleen: Normal in size without focal abnormality. Adrenals/Urinary Tract: Both adrenal glands appear normal. Both kidneys appear normal without hydronephrosis or mass lesion. Stomach/Bowel: Circumferential wall thickening of the distal stomach attributed to the adjacent pancreatic inflammation. No bowel wall thickening, distention or surrounding inflammatory changes. Vascular/Lymphatic: Multiple small lymph nodes within the base of the mesentery are stable. There is no retroperitoneal lymphadenopathy. No acute vascular findings are seen. There is aortic and branch vessel atherosclerosis. The portal and superior mesenteric veins are patent. There is chronic occlusion of the splenic vein with associated chronic collaterals. Other: Inflammatory changes around the  pancreas, extending into the mesenteric fat and left anterior pararenal space. No ascites. Musculoskeletal: No acute or significant osseous findings. Previous lower lumbar fusion. IMPRESSION: 1. Findings are most consistent with acute on chronic pancreatitis without significant change from CT of 3 days ago. There are inflammatory changes and ill-defined fluid collections within and surrounding the pancreas. No evidence of pancreatic necrosis. 2. Progressive biliary and pancreatic ductal dilatation compared with 2019 MRI. No evidence of choledocholithiasis or distinct pancreatic mass. These findings are likely due to pancreatic stricture with superimposed inflammation. Follow-up imaging with MRI or EUS after resolution of the acute episode recommended. 3. Stable wall thickening of the distal stomach attributed to pancreatic inflammation. 4. 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 Electronically Signed   By: Richardean Sale M.D.   On: 03/03/2019 19:28     Time spent: 35 minutes  Author: Berle Mull, MD [facility] 03/03/2019 9:14 PM  To reach On-call, see care teams to locate the attending and reach out to them via [email or web address]. If 7PM-7AM, please contact night-coverage If you still have difficulty reaching the attending provider, please page the [facility] (Director on Call) for [facility] on amion for assistance.

## 2019-03-03 NOTE — Plan of Care (Signed)

## 2019-03-03 NOTE — Progress Notes (Signed)
[name] 1:39 PM  Subjective: Patient seen and examined and case discussed with Dr. Michail Sermon and his wife in hospital computer chart reviewed and we answered all of their questions and he is on pancreatic enzymes at home and had gone a year and a half until November without any GI issues and he did have back surgery this summer but does not know what caused his obvious recurrence  Objective: Vital signs stable afebrile no acute distress patient lying comfortably in the bed abdomen is a little tender left upper quadrant seems to be the worst minimal guarding no rebound rare bowel sounds decreased tenderness lower abdomen bili and alk phos slight decreased white count normal  Assessment: Acute on chronic pancreatitis  Plan: We will resume pancreatic enzymes and proceed with an MRCP to better evaluate his CBD obstruction and his pancreatitis and please call my partner Dr. Paulita Fujita tomorrow if question or problem otherwise we will ask our rounding team to check on Monday and decide any other work-up and plans and I did discuss ERCP and CBD stenting including the risks with the patient and his wife just in case it is needed [facility]  office [phone] After 5PM or if no answer call [phone]

## 2019-03-04 LAB — CBC
HCT: 37.1 % — ABNORMAL LOW (ref 39.0–52.0)
Hemoglobin: 12.1 g/dL — ABNORMAL LOW (ref 13.0–17.0)
MCH: 30.6 pg (ref 26.0–34.0)
MCHC: 32.6 g/dL (ref 30.0–36.0)
MCV: 93.7 fL (ref 80.0–100.0)
Platelets: 226 10*3/uL (ref 150–400)
RBC: 3.96 MIL/uL — ABNORMAL LOW (ref 4.22–5.81)
RDW: 13.8 % (ref 11.5–15.5)
WBC: 8 10*3/uL (ref 4.0–10.5)
nRBC: 0 % (ref 0.0–0.2)

## 2019-03-04 LAB — HEPATIC FUNCTION PANEL
ALT: 51 U/L — ABNORMAL HIGH (ref 0–44)
AST: 42 U/L — ABNORMAL HIGH (ref 15–41)
Albumin: 2.7 g/dL — ABNORMAL LOW (ref 3.5–5.0)
Alkaline Phosphatase: 427 U/L — ABNORMAL HIGH (ref 38–126)
Bilirubin, Direct: 3.8 mg/dL — ABNORMAL HIGH (ref 0.0–0.2)
Indirect Bilirubin: 2.7 mg/dL — ABNORMAL HIGH (ref 0.3–0.9)
Total Bilirubin: 6.5 mg/dL — ABNORMAL HIGH (ref 0.3–1.2)
Total Protein: 6 g/dL — ABNORMAL LOW (ref 6.5–8.1)

## 2019-03-04 LAB — BASIC METABOLIC PANEL
Anion gap: 10 (ref 5–15)
BUN: 6 mg/dL (ref 6–20)
CO2: 27 mmol/L (ref 22–32)
Calcium: 8.8 mg/dL — ABNORMAL LOW (ref 8.9–10.3)
Chloride: 101 mmol/L (ref 98–111)
Creatinine, Ser: 1.11 mg/dL (ref 0.61–1.24)
GFR calc Af Amer: >60 mL/min (ref >60)
GFR calc non Af Amer: >60 mL/min (ref >60)
Glucose, Bld: 106 mg/dL — ABNORMAL HIGH (ref 70–99)
Potassium: 4.2 mmol/L (ref 3.5–5.1)
Sodium: 138 mmol/L (ref 135–145)

## 2019-03-04 NOTE — Progress Notes (Signed)
On-call [facility] paged in regards to patient's oxygen saturation at 86% on RA. This nurse placed the patient on 2LO2 via [place] and oxygen saturation is now 94%. This nurse asked for order to maintain oxygen saturation of 92% or above. Will continue to monitor patient.

## 2019-03-04 NOTE — Progress Notes (Signed)
[facility] Progress Note  Patient: Leonard [name]   PCP: Gaynelle Arabian, MD DOB: [birth year]   DOA: 02/28/2019   DOS: 03/04/2019   Date of Service: the patient was seen and examined on 03/04/2019  Chief Complaint  Patient presents with  . Pancreatitis   Brief hospital course: Patient is a 58 year old male past medical history significant for alcohol abuse and acute and chronic pancreatitis.  Patient follows up with Dr. Michail Sermon GI.  Patient was admitted with worsening abdominal pain.  Apparently, patient had been having abdominal pain with nausea for about 2 months.  CT scan done on outpatient basis confirmed pancreatitis.  Patient was advised to come to the hospital for further assessment and management.  Patient is currently being managed supportively.  Patient underwent MRI of the abdomen.  GI team is directing care.  Patient was seen alongside patient's wife.  Patient's wife was updated extensively.    Subjective:  -Patient's pain is controlled.   -No nausea or vomiting.   -No fever or chills.    Assessment and Plan: Scheduled Meds: . aspirin  81 mg Oral Daily  . cholecalciferol  2,000 Units Oral Daily  . enoxaparin (LOVENOX) injection  40 mg Subcutaneous Q24H  . lipase/protease/amylase  36,000 Units Oral QID  . polyethylene glycol  17 g Oral Daily  . pregabalin  75 mg Oral QHS  . rosuvastatin  20 mg Oral QHS  . sertraline  75 mg Oral BID  . sodium chloride flush  3 mL Intravenous Once  . traZODone  150 mg Oral QHS   Continuous Infusions: . sodium chloride 75 mL/hr at 03/04/19 1333   PRN Meds: acetaminophen **OR** acetaminophen, morphine injection, ondansetron **OR** ondansetron (ZOFRAN) IV, oxyCODONE  1. Acute on chronic pancreatitis with likely pseudocyst formation. Elevated LFT and bilirubin elevation due to pancreatitis and not due to choledocholithiasis. [facility] gastroenterology consulted. Appreciate assistance. Currently plan is to treat  conservatively without any intervention. GI will follow the patient peripherally. Currently on IV morphine.  Continue oxycodone. Continue IV fluids. [facility] recommends MRCP. We will monitor the results. Patient was transitioned to clear liquid diet but unable to tolerate and therefore backup NPO. 03/04/2019: Patient did not tolerate clear liquids.  Patient is currently n.p.o.  Continue IV fluids.  Continue to manage expectantly.  GI input is highly appreciated.   2.  Transaminitis Monitor for now. GI recommends conservative measures.  3.  Numerous small mesenteric and retroperitoneal lymphadenopathy likely inflammatory. Monitor.  4.  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  Monitor for now.  5.  Carotid artery dissection. History. Monitor.  6.  History of alcohol abuse. Used to drink heavily in the past. He has not drank any alcohol since July 2020. 03/04/2019: Patient's wife reports that patient has not had alcohol since 2018.  Diet: NPO except ice chips and sips of water DVT Prophylaxis: Subcutaneous Lovenox   Advance goals of care discussion: Full code  Family Communication: Wife  Disposition:  Discharge to home eventually.  Consultants: Gastroenterology/[facility] Procedures: None  Antibiotics: Anti-infectives (From admission, onward)   None       Objective: Physical Exam: Vitals:   03/03/19 1312 03/03/19 2005 03/04/19 0317 03/04/19 0845  BP: 117/77 110/65 101/67 126/65  Pulse: 72 84 86 87  Resp: 16   17  Temp: 98.8 F (37.1 C) 99.2 F (37.3 C) 99.2 F (37.3 C) 99.3 F (37.4 C)  TempSrc: Oral Oral Oral Oral  SpO2: 95% 92% 90%  93%  Weight:      Height:        Intake/Output Summary (Last 24 hours) at 03/04/2019 1622 Last data filed at 03/04/2019 1330 Gross per 24 hour  Intake 580 ml  Output --  Net 580 ml   Filed Weights   03/01/19 1027  Weight: 84 kg   General: alert and oriented to time, place, and person.  Patient is  not in any distress.   Eyes: Mild pallor. Neck: no JVD Abdomen: Tender on palpation. Cardiovascular: S1 and S2  Respiratory: Clear to auscultation Extremities: no Pedal edema, no calf tenderness Neurologic: Awake and alert.  Patient moves all extremities.  CBC: Recent Labs  Lab 02/28/19 1614 03/02/19 0338 03/03/19 0704 03/04/19 0550  WBC 6.7 6.4 8.9 8.0  HGB 13.0 11.6* 11.6* 12.1*  HCT 39.9 34.4* 35.6* 37.1*  MCV 94.8 92.5 93.4 93.7  PLT 279 222 224 [id number]   Basic Metabolic Panel: Recent Labs  Lab 02/28/19 1614 03/01/19 0200 03/01/19 0213 03/02/19 0338 03/03/19 0704 03/04/19 0550  NA 136 138  --  141 138 138  K 3.5 3.6  --  3.9 4.3 4.2  CL 102 101  --  106 102 101  CO2 24 25  --  24 22 27   GLUCOSE 134* 145*  --  72 101* 106*  BUN 6 6  --  8 7 6   CREATININE 1.13 1.08  --  1.02 1.13 1.11  CALCIUM 9.3 9.2  --  8.8* 8.7* 8.8*  MG  --   --  2.2 2.1  --   --   PHOS  --   --  3.7  --   --   --     Liver Function Tests: Recent Labs  Lab 02/28/19 1614 03/01/19 0200 03/03/19 0704 03/04/19 0550  AST 79* 83*  81* 49* 42*  ALT 106* 97*  97* 63* 51*  ALKPHOS 640* 577*  593* 496* 427*  BILITOT 5.8* 6.8*  6.7* 5.5* 6.5*  PROT 6.8 6.6  6.7 6.0* 6.0*  ALBUMIN 3.4* 3.1*  3.2* 2.8* 2.7*   Recent Labs  Lab 02/28/19 1614 03/02/19 0338  LIPASE 315* 180*   No results for input(s): AMMONIA in the last 168 hours. Coagulation Profile: Recent Labs  Lab 03/01/19 0200  INR 1.2   Cardiac Enzymes: No results for input(s): CKTOTAL, CKMB, CKMBINDEX, TROPONINI in the last 168 hours. BNP (last 3 results) No results for input(s): PROBNP in the last 8760 hours. CBG: No results for input(s): GLUCAP in the last 168 hours. Studies: MR 3D Recon At Scanner  Result Date: 03/03/2019 CLINICAL DATA:  Abdominal pain with nausea for 2 months. History of ethanol abuse with acute and chronic pancreatitis. Elevated transaminase levels, bilirubin and lipase. Biliary and pancreatic  ductal dilatation on CT. EXAM: MRI ABDOMEN WITHOUT AND WITH CONTRAST (INCLUDING MRCP) TECHNIQUE: Multiplanar multisequence MR imaging of the abdomen was performed both before and after the administration of intravenous contrast. Heavily T2-weighted images of the biliary and pancreatic ducts were obtained, and three-dimensional MRCP images were rendered by post processing. CONTRAST:  14mL GADAVIST GADOBUTROL 1 MMOL/ML IV SOLN COMPARISON:  Abdominal CT 02/28/2019. Abdominal MRI 10/31/2017. FINDINGS: Lower chest: Mild atelectasis at both lung bases. No significant pleural effusion. Hepatobiliary: No significant hepatic steatosis. No focal hepatic lesion or abnormal enhancement following contrast. There is no evidence of gallstones or gallbladder wall thickening. Moderate intrahepatic and extrahepatic biliary dilatation is present as seen on recent CT. This has progressed compared with  previous MRI. The common hepatic duct measures up to 14 mm in diameter. No evidence of choledocholithiasis. There is a low insertion of the cystic duct. Pancreas: In correlation with previous studies, including the recent CT, there is evidence of acute on chronic pancreatitis. There is progressive dilatation of the main pancreatic duct within the pancreatic body and tail to 8 mm. There are multiple parenchymal calcifications throughout the pancreas on recent CT, especially in the uncinate process. On this study, there are 2 rounded areas of decreased enhancement inferiorly in the pancreatic tail; these are surrounded by calcifications on the recent CT. No pancreatic head mass identified. Extensive peripancreatic inflammatory changes are present as seen on recent CT. There are small ill-defined fluid collections within the pancreatic tail. There is a complex peripherally enhancing fluid collection within the gastrocolic ligament measuring up to 7.9 x 2.4 cm on image 33/5. This is similar to the recent CT. The pancreas enhances homogeneously  following contrast, without evidence of necrosis. Spleen: Normal in size without focal abnormality. Adrenals/Urinary Tract: Both adrenal glands appear normal. Both kidneys appear normal without hydronephrosis or mass lesion. Stomach/Bowel: Circumferential wall thickening of the distal stomach attributed to the adjacent pancreatic inflammation. No bowel wall thickening, distention or surrounding inflammatory changes. Vascular/Lymphatic: Multiple small lymph nodes within the base of the mesentery are stable. There is no retroperitoneal lymphadenopathy. No acute vascular findings are seen. There is aortic and branch vessel atherosclerosis. The portal and superior mesenteric veins are patent. There is chronic occlusion of the splenic vein with associated chronic collaterals. Other: Inflammatory changes around the pancreas, extending into the mesenteric fat and left anterior pararenal space. No ascites. Musculoskeletal: No acute or significant osseous findings. Previous lower lumbar fusion. IMPRESSION: 1. Findings are most consistent with acute on chronic pancreatitis without significant change from CT of 3 days ago. There are inflammatory changes and ill-defined fluid collections within and surrounding the pancreas. No evidence of pancreatic necrosis. 2. Progressive biliary and pancreatic ductal dilatation compared with 2019 MRI. No evidence of choledocholithiasis or distinct pancreatic mass. These findings are likely due to pancreatic stricture with superimposed inflammation. Follow-up imaging with MRI or EUS after resolution of the acute episode recommended. 3. Stable wall thickening of the distal stomach attributed to pancreatic inflammation. 4. 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 Electronically Signed   By: Richardean Sale M.D.   On: 03/03/2019 19:28   MR ABDOMEN MRCP W WO CONTAST  Result Date: 03/03/2019 CLINICAL DATA:  Abdominal pain with nausea for 2 months. History of ethanol abuse with  acute and chronic pancreatitis. Elevated transaminase levels, bilirubin and lipase. Biliary and pancreatic ductal dilatation on CT. EXAM: MRI ABDOMEN WITHOUT AND WITH CONTRAST (INCLUDING MRCP) TECHNIQUE: Multiplanar multisequence MR imaging of the abdomen was performed both before and after the administration of intravenous contrast. Heavily T2-weighted images of the biliary and pancreatic ducts were obtained, and three-dimensional MRCP images were rendered by post processing. CONTRAST:  91mL GADAVIST GADOBUTROL 1 MMOL/ML IV SOLN COMPARISON:  Abdominal CT 02/28/2019. Abdominal MRI 10/31/2017. FINDINGS: Lower chest: Mild atelectasis at both lung bases. No significant pleural effusion. Hepatobiliary: No significant hepatic steatosis. No focal hepatic lesion or abnormal enhancement following contrast. There is no evidence of gallstones or gallbladder wall thickening. Moderate intrahepatic and extrahepatic biliary dilatation is present as seen on recent CT. This has progressed compared with previous MRI. The common hepatic duct measures up to 14 mm in diameter. No evidence of choledocholithiasis. There is a low insertion of the cystic  duct. Pancreas: In correlation with previous studies, including the recent CT, there is evidence of acute on chronic pancreatitis. There is progressive dilatation of the main pancreatic duct within the pancreatic body and tail to 8 mm. There are multiple parenchymal calcifications throughout the pancreas on recent CT, especially in the uncinate process. On this study, there are 2 rounded areas of decreased enhancement inferiorly in the pancreatic tail; these are surrounded by calcifications on the recent CT. No pancreatic head mass identified. Extensive peripancreatic inflammatory changes are present as seen on recent CT. There are small ill-defined fluid collections within the pancreatic tail. There is a complex peripherally enhancing fluid collection within the gastrocolic ligament  measuring up to 7.9 x 2.4 cm on image 33/5. This is similar to the recent CT. The pancreas enhances homogeneously following contrast, without evidence of necrosis. Spleen: Normal in size without focal abnormality. Adrenals/Urinary Tract: Both adrenal glands appear normal. Both kidneys appear normal without hydronephrosis or mass lesion. Stomach/Bowel: Circumferential wall thickening of the distal stomach attributed to the adjacent pancreatic inflammation. No bowel wall thickening, distention or surrounding inflammatory changes. Vascular/Lymphatic: Multiple small lymph nodes within the base of the mesentery are stable. There is no retroperitoneal lymphadenopathy. No acute vascular findings are seen. There is aortic and branch vessel atherosclerosis. The portal and superior mesenteric veins are patent. There is chronic occlusion of the splenic vein with associated chronic collaterals. Other: Inflammatory changes around the pancreas, extending into the mesenteric fat and left anterior pararenal space. No ascites. Musculoskeletal: No acute or significant osseous findings. Previous lower lumbar fusion. IMPRESSION: 1. Findings are most consistent with acute on chronic pancreatitis without significant change from CT of 3 days ago. There are inflammatory changes and ill-defined fluid collections within and surrounding the pancreas. No evidence of pancreatic necrosis. 2. Progressive biliary and pancreatic ductal dilatation compared with 2019 MRI. No evidence of choledocholithiasis or distinct pancreatic mass. These findings are likely due to pancreatic stricture with superimposed inflammation. Follow-up imaging with MRI or EUS after resolution of the acute episode recommended. 3. Stable wall thickening of the distal stomach attributed to pancreatic inflammation. 4. 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 Electronically Signed   By: Richardean Sale M.D.   On: 03/03/2019 19:28     Time spent: 35  minutes  Author: Dana Allan, MD [facility] 03/04/2019 4:22 PM  To reach On-call, see care teams to locate the attending and reach out to them via [email or web address]. If 7PM-7AM, please contact night-coverage If you still have difficulty reaching the attending provider, please page the [facility] (Director on Call) for [facility] on amion for assistance.

## 2019-03-04 NOTE — Plan of Care (Signed)

## 2019-03-05 DIAGNOSIS — D649 Anemia, unspecified: Secondary | ICD-10-CM

## 2019-03-05 LAB — COMPREHENSIVE METABOLIC PANEL
ALT: 51 U/L — ABNORMAL HIGH (ref 0–44)
AST: 48 U/L — ABNORMAL HIGH (ref 15–41)
Albumin: 2.5 g/dL — ABNORMAL LOW (ref 3.5–5.0)
Alkaline Phosphatase: 365 U/L — ABNORMAL HIGH (ref 38–126)
Anion gap: 10 (ref 5–15)
BUN: 7 mg/dL (ref 6–20)
CO2: 30 mmol/L (ref 22–32)
Calcium: 8.5 mg/dL — ABNORMAL LOW (ref 8.9–10.3)
Chloride: 98 mmol/L (ref 98–111)
Creatinine, Ser: 1 mg/dL (ref 0.61–1.24)
GFR calc Af Amer: >60 mL/min (ref >60)
GFR calc non Af Amer: >60 mL/min (ref >60)
Glucose, Bld: 128 mg/dL — ABNORMAL HIGH (ref 70–99)
Potassium: 3.6 mmol/L (ref 3.5–5.1)
Sodium: 138 mmol/L (ref 135–145)
Total Bilirubin: 7.5 mg/dL — ABNORMAL HIGH (ref 0.3–1.2)
Total Protein: 5.8 g/dL — ABNORMAL LOW (ref 6.5–8.1)

## 2019-03-05 LAB — CBC
HCT: 35.7 % — ABNORMAL LOW (ref 39.0–52.0)
Hemoglobin: 11.8 g/dL — ABNORMAL LOW (ref 13.0–17.0)
MCH: 31.1 pg (ref 26.0–34.0)
MCHC: 33.1 g/dL (ref 30.0–36.0)
MCV: 94.2 fL (ref 80.0–100.0)
Platelets: 196 10*3/uL (ref 150–400)
RBC: 3.79 MIL/uL — ABNORMAL LOW (ref 4.22–5.81)
RDW: 13.9 % (ref 11.5–15.5)
WBC: 8.9 10*3/uL (ref 4.0–10.5)
nRBC: 0 % (ref 0.0–0.2)

## 2019-03-05 LAB — LIPASE, BLOOD: Lipase: 61 U/L — ABNORMAL HIGH (ref 11–51)

## 2019-03-05 NOTE — Progress Notes (Signed)
[facility] Progress Note  Subjective: The patient was seen today in regards to follow-up of his acute on chronic pancreatitis.  States he is still having some abdominal pain.  A abdominal MRI done yesterday showed findings most consistent with acute on chronic pancreatitis without significant change from the exam 3 days previous.  There were inflammatory changes and ill-defined fluid collections within and surrounding the pancreas but no evidence of pancreatic necrosis.  There was progressive biliary and pancreatic ductal dilatation compared to a MRI from 2019.  No evidence of choledocholithiasis or pancreatic mass.  It was felt that the findings were most likely due to a pancreatic stricture with superimposed inflammation and follow-up with MRI or EUS after resolution of the acute episode was recommended.  There was stable wall thickening in the distal stomach attributed to pancreatic inflammation and there was chronic splenic vein occlusion with chronic collaterals.  Objective: Vital signs in last 24 hours: Temp:  [98.2 F (36.8 C)-100.3 F (37.9 C)] 98.2 F (36.8 C) (12/28 0951) Pulse Rate:  [79-96] 91 (12/28 0951) Resp:  [16-17] 16 (12/28 0951) BP: (100-116)/(62-69) 106/63 (12/28 0951) SpO2:  [86 %-98 %] 92 % (12/28 0951) Weight change:    PE: No distress  Heart regular rhythm  Abdomen: Bowel sounds diminished, soft, some tenderness in the upper abdomen  Lab Results: Results for orders placed or performed during the hospital encounter of 02/28/19 (from the past 24 hour(s))  CBC     Status: Abnormal   Collection Time: 03/05/19  5:32 AM  Result Value Ref Range   WBC 8.9 4.0 - 10.5 K/uL   RBC 3.79 (L) 4.22 - 5.81 MIL/uL   Hemoglobin 11.8 (L) 13.0 - 17.0 g/dL   HCT 35.7 (L) 39.0 - 52.0 %   MCV 94.2 80.0 - 100.0 fL   MCH 31.1 26.0 - 34.0 pg   MCHC 33.1 30.0 - 36.0 g/dL   RDW 13.9 11.5 - 15.5 %   Platelets 196 150 - 400 K/uL   nRBC 0.0 0.0 - 0.2 %  Lipase, blood      Status: Abnormal   Collection Time: 03/05/19  5:32 AM  Result Value Ref Range   Lipase 61 (H) 11 - 51 U/L  Comprehensive metabolic panel     Status: Abnormal   Collection Time: 03/05/19  5:32 AM  Result Value Ref Range   Sodium 138 135 - 145 mmol/L   Potassium 3.6 3.5 - 5.1 mmol/L   Chloride 98 98 - 111 mmol/L   CO2 30 22 - 32 mmol/L   Glucose, Bld 128 (H) 70 - 99 mg/dL   BUN 7 6 - 20 mg/dL   Creatinine, Ser 1.00 0.61 - 1.24 mg/dL   Calcium 8.5 (L) 8.9 - 10.3 mg/dL   Total Protein 5.8 (L) 6.5 - 8.1 g/dL   Albumin 2.5 (L) 3.5 - 5.0 g/dL   AST 48 (H) 15 - 41 U/L   ALT 51 (H) 0 - 44 U/L   Alkaline Phosphatase 365 (H) 38 - 126 U/L   Total Bilirubin 7.5 (H) 0.3 - 1.2 mg/dL   GFR calc non Af Amer >60 >60 mL/min   GFR calc Af Amer >60 >60 mL/min   Anion gap 10 5 - 15    Studies/Results: No results found.    Assessment: Acute on chronic pancreatitis  Plan:   Continue supportive care.  I told the patient that if nutritional issues became a problem that we could place a nasojejunal feeding tube  but he stated he did not wish to do that at this time.  We will follow clinically.    SAM F Laritza Vokes 03/05/2019, 11:14 AM  Pager: [phone] If no answer or after 5 PM call [phone]

## 2019-03-05 NOTE — Progress Notes (Signed)
Leonard [name]  PROGRESS NOTE    Leonard [name]  [id number] DOB: May 03, 1960 DOA: 02/28/2019 PCP: Gaynelle Arabian, MD   Brief Narrative:   Patient is a 58 year old male past medical history significant for alcohol abuse and acute and chronic pancreatitis.  Patient follows up with Dr. Michail Sermon GI.  Patient was admitted with worsening abdominal pain.  Apparently, patient had been having abdominal pain with nausea for about 2 months.  CT scan done on outpatient basis confirmed pancreatitis.  Patient was advised to come to the hospital for further assessment and management.  Patient is currently being managed supportively.  Patient underwent MRI of the abdomen.  GI team is directing care.  Patient was seen alongside patient's wife.  Patient's wife was updated extensively.    03/05/19: No acute events ON. Lab values trending well. Family updated at bedside.   Assessment & Plan:   Principal Problem:   Acute on chronic pancreatitis (HCC) Active Problems:   Elevated LFTs   Acute alcoholic pancreatitis   Chronic pancreatitis (HCC)   Alcohol dependence in remission ([place])  Acute on chronic pancreatitis w/ possible pseudocyst formation.     - Elevated LFT and bilirubin elevation due to pancreatitis and not due to choledocholithiasis.     - GI onboard, conservative treatment for now     - IV morphine, oxycodone.     - MRCP: IMPRESSION:         1. Findings are most consistent with acute on chronic pancreatitis without significant change from CT of 3 days ago. There are inflammatory changes and ill-defined fluid collections within and surrounding the pancreas. No evidence of pancreatic necrosis.         2. Progressive biliary and pancreatic ductal dilatation compared with 2019 MRI. No evidence of choledocholithiasis or distinct pancreatic mass. These findings are likely due to pancreatic stricture with superimposed inflammation. Follow-up imaging with MRI or EUS after resolution of the acute episode  recommended.         3. Stable wall thickening of the distal stomach attributed to pancreatic inflammation.         4. 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     - now on CLD, monitor  Transaminitis     - labs values trending down, continue conservative measures  Numerous small mesenteric and retroperitoneal lymphadenopathy likely inflammatory.     - Monitor.  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     - Monitor for now.  Carotid artery dissection by history     - Monitor.  History of alcohol abuse.     - previous Hx of heavy drinking, however, patient's wife reports that patient has not had alcohol since 2018.  Normocytic anemia     - no evidence of bleed     - monitor  DVT prophylaxis: lovenox Code Status: FULL Family Communication: With family at bedside   Disposition Plan: TBD  Consultants:   GI  ROS:  Denies CP, N, dyspnea. Reports ab pain. Remainder 10-pt ROS is negative for all not previously mentioned.  Subjective: "It's a little better. I can drink something."  Objective: Vitals:   03/05/19 0034 03/05/19 0338 03/05/19 0951 03/05/19 1304  BP: 100/62 106/69 106/63 106/61  Pulse: 81 79 91 79  Resp: 17  16 18   Temp: 98.2 F (36.8 C)  98.2 F (36.8 C) 98.6 F (37 C)  TempSrc: Oral  Oral Oral  SpO2: 97% 98% 92% 98%  Weight:  Height:        Intake/Output Summary (Last 24 hours) at 03/05/2019 1319 Last data filed at 03/04/2019 1330 Gross per 24 hour  Intake 240 ml  Output --  Net 240 ml   Filed Weights   03/01/19 1027  Weight: 84 kg    Examination:  General: 58 y.o. male resting in bed in NAD Cardiovascular: RRR, +S1, S2, no m/g/r, equal pulses throughout Respiratory: CTABL, no w/r/r, normal WOB GI: BS hypoactive, slight distention, global abdominal tenderness, no masses noted, no organomegaly noted MSK: No e/c/c Neuro: A&O x 3, no focal deficits Psyc: Appropriate interaction and affect,  calm/cooperative   Data Reviewed: I have personally reviewed following labs and imaging studies.  CBC: Recent Labs  Lab 02/28/19 1614 03/02/19 0338 03/03/19 0704 03/04/19 0550 03/05/19 0532  WBC 6.7 6.4 8.9 8.0 8.9  HGB 13.0 11.6* 11.6* 12.1* 11.8*  HCT 39.9 34.4* 35.6* 37.1* 35.7*  MCV 94.8 92.5 93.4 93.7 94.2  PLT 279 222 224 226 [id number]   Basic Metabolic Panel: Recent Labs  Lab 03/01/19 0200 03/01/19 0213 03/02/19 0338 03/03/19 0704 03/04/19 0550 03/05/19 0532  NA 138  --  141 138 138 138  K 3.6  --  3.9 4.3 4.2 3.6  CL 101  --  106 102 101 98  CO2 25  --  24 22 27 30   GLUCOSE 145*  --  72 101* 106* 128*  BUN 6  --  8 7 6 7   CREATININE 1.08  --  1.02 1.13 1.11 1.00  CALCIUM 9.2  --  8.8* 8.7* 8.8* 8.5*  MG  --  2.2 2.1  --   --   --   PHOS  --  3.7  --   --   --   --    GFR: Estimated Creatinine Clearance: 82.1 mL/min (by C-G formula based on SCr of 1 mg/dL). Liver Function Tests: Recent Labs  Lab 02/28/19 1614 03/01/19 0200 03/03/19 0704 03/04/19 0550 03/05/19 0532  AST 79* 83*  81* 49* 42* 48*  ALT 106* 97*  97* 63* 51* 51*  ALKPHOS 640* 577*  593* 496* 427* 365*  BILITOT 5.8* 6.8*  6.7* 5.5* 6.5* 7.5*  PROT 6.8 6.6  6.7 6.0* 6.0* 5.8*  ALBUMIN 3.4* 3.1*  3.2* 2.8* 2.7* 2.5*   Recent Labs  Lab 02/28/19 1614 03/02/19 0338 03/05/19 0532  LIPASE 315* 180* 61*   No results for input(s): AMMONIA in the last 168 hours. Coagulation Profile: Recent Labs  Lab 03/01/19 0200  INR 1.2   Cardiac Enzymes: No results for input(s): CKTOTAL, CKMB, CKMBINDEX, TROPONINI in the last 168 hours. BNP (last 3 results) No results for input(s): PROBNP in the last 8760 hours. HbA1C: No results for input(s): HGBA1C in the last 72 hours. CBG: No results for input(s): GLUCAP in the last 168 hours. Lipid Profile: No results for input(s): CHOL, HDL, LDLCALC, TRIG, CHOLHDL, LDLDIRECT in the last 72 hours. Thyroid Function Tests: No results for input(s): TSH,  T4TOTAL, FREET4, T3FREE, THYROIDAB in the last 72 hours. Anemia Panel: No results for input(s): VITAMINB12, FOLATE, FERRITIN, TIBC, IRON, RETICCTPCT in the last 72 hours. Sepsis Labs: No results for input(s): PROCALCITON, LATICACIDVEN in the last 168 hours.  Recent Results (from the past 240 hour(s))  SARS CORONAVIRUS 2 (TAT 6-24 HRS) Nasopharyngeal Nasopharyngeal Swab     Status: None   Collection Time: 03/01/19  1:50 AM   Specimen: Nasopharyngeal Swab  Result Value Ref Range Status   SARS  Coronavirus 2 NEGATIVE NEGATIVE Final    Comment: (NOTE) SARS-CoV-2 target nucleic acids are NOT DETECTED. The SARS-CoV-2 RNA is generally detectable in upper and lower respiratory specimens during the acute phase of infection. Negative results do not preclude SARS-CoV-2 infection, do not rule out co-infections with other pathogens, and should not be used as the sole basis for treatment or other patient management decisions. Negative results must be combined with clinical observations, patient history, and epidemiological information. The expected result is Negative. Fact Sheet for Patients: SugarRoll.be Fact Sheet for Healthcare Providers: https://www.woods-mathews.com/ This test is not yet approved or cleared by the [place] FDA and  has been authorized for detection and/or diagnosis of SARS-CoV-2 by FDA under an Emergency Use Authorization (EUA). This EUA will remain  in effect (meaning this test can be used) for the duration of the COVID-19 declaration under Section 56 4(b)(1) of the Act, 21 U.S.C. section 360bbb-3(b)(1), unless the authorization is terminated or revoked sooner. Performed at [facility], [place] [address] [place], [place] [address]       Radiology Studies: MR 3D Recon At Scanner  Result Date: 03/03/2019 CLINICAL DATA:  Abdominal pain with nausea for 2 months. History of ethanol abuse with acute and chronic  pancreatitis. Elevated transaminase levels, bilirubin and lipase. Biliary and pancreatic ductal dilatation on CT. EXAM: MRI ABDOMEN WITHOUT AND WITH CONTRAST (INCLUDING MRCP) TECHNIQUE: Multiplanar multisequence MR imaging of the abdomen was performed both before and after the administration of intravenous contrast. Heavily T2-weighted images of the biliary and pancreatic ducts were obtained, and three-dimensional MRCP images were rendered by post processing. CONTRAST:  25mL GADAVIST GADOBUTROL 1 MMOL/ML IV SOLN COMPARISON:  Abdominal CT 02/28/2019. Abdominal MRI 10/31/2017. FINDINGS: Lower chest: Mild atelectasis at both lung bases. No significant pleural effusion. Hepatobiliary: No significant hepatic steatosis. No focal hepatic lesion or abnormal enhancement following contrast. There is no evidence of gallstones or gallbladder wall thickening. Moderate intrahepatic and extrahepatic biliary dilatation is present as seen on recent CT. This has progressed compared with previous MRI. The common hepatic duct measures up to 14 mm in diameter. No evidence of choledocholithiasis. There is a low insertion of the cystic duct. Pancreas: In correlation with previous studies, including the recent CT, there is evidence of acute on chronic pancreatitis. There is progressive dilatation of the main pancreatic duct within the pancreatic body and tail to 8 mm. There are multiple parenchymal calcifications throughout the pancreas on recent CT, especially in the uncinate process. On this study, there are 2 rounded areas of decreased enhancement inferiorly in the pancreatic tail; these are surrounded by calcifications on the recent CT. No pancreatic head mass identified. Extensive peripancreatic inflammatory changes are present as seen on recent CT. There are small ill-defined fluid collections within the pancreatic tail. There is a complex peripherally enhancing fluid collection within the gastrocolic ligament measuring up to 7.9 x  2.4 cm on image 33/5. This is similar to the recent CT. The pancreas enhances homogeneously following contrast, without evidence of necrosis. Spleen: Normal in size without focal abnormality. Adrenals/Urinary Tract: Both adrenal glands appear normal. Both kidneys appear normal without hydronephrosis or mass lesion. Stomach/Bowel: Circumferential wall thickening of the distal stomach attributed to the adjacent pancreatic inflammation. No bowel wall thickening, distention or surrounding inflammatory changes. Vascular/Lymphatic: Multiple small lymph nodes within the base of the mesentery are stable. There is no retroperitoneal lymphadenopathy. No acute vascular findings are seen. There is aortic and branch vessel atherosclerosis. The portal and superior mesenteric  veins are patent. There is chronic occlusion of the splenic vein with associated chronic collaterals. Other: Inflammatory changes around the pancreas, extending into the mesenteric fat and left anterior pararenal space. No ascites. Musculoskeletal: No acute or significant osseous findings. Previous lower lumbar fusion. IMPRESSION: 1. Findings are most consistent with acute on chronic pancreatitis without significant change from CT of 3 days ago. There are inflammatory changes and ill-defined fluid collections within and surrounding the pancreas. No evidence of pancreatic necrosis. 2. Progressive biliary and pancreatic ductal dilatation compared with 2019 MRI. No evidence of choledocholithiasis or distinct pancreatic mass. These findings are likely due to pancreatic stricture with superimposed inflammation. Follow-up imaging with MRI or EUS after resolution of the acute episode recommended. 3. Stable wall thickening of the distal stomach attributed to pancreatic inflammation. 4. 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 Electronically Signed   By: Richardean Sale M.D.   On: 03/03/2019 19:28   MR ABDOMEN MRCP W WO CONTAST  Result Date:  03/03/2019 CLINICAL DATA:  Abdominal pain with nausea for 2 months. History of ethanol abuse with acute and chronic pancreatitis. Elevated transaminase levels, bilirubin and lipase. Biliary and pancreatic ductal dilatation on CT. EXAM: MRI ABDOMEN WITHOUT AND WITH CONTRAST (INCLUDING MRCP) TECHNIQUE: Multiplanar multisequence MR imaging of the abdomen was performed both before and after the administration of intravenous contrast. Heavily T2-weighted images of the biliary and pancreatic ducts were obtained, and three-dimensional MRCP images were rendered by post processing. CONTRAST:  37mL GADAVIST GADOBUTROL 1 MMOL/ML IV SOLN COMPARISON:  Abdominal CT 02/28/2019. Abdominal MRI 10/31/2017. FINDINGS: Lower chest: Mild atelectasis at both lung bases. No significant pleural effusion. Hepatobiliary: No significant hepatic steatosis. No focal hepatic lesion or abnormal enhancement following contrast. There is no evidence of gallstones or gallbladder wall thickening. Moderate intrahepatic and extrahepatic biliary dilatation is present as seen on recent CT. This has progressed compared with previous MRI. The common hepatic duct measures up to 14 mm in diameter. No evidence of choledocholithiasis. There is a low insertion of the cystic duct. Pancreas: In correlation with previous studies, including the recent CT, there is evidence of acute on chronic pancreatitis. There is progressive dilatation of the main pancreatic duct within the pancreatic body and tail to 8 mm. There are multiple parenchymal calcifications throughout the pancreas on recent CT, especially in the uncinate process. On this study, there are 2 rounded areas of decreased enhancement inferiorly in the pancreatic tail; these are surrounded by calcifications on the recent CT. No pancreatic head mass identified. Extensive peripancreatic inflammatory changes are present as seen on recent CT. There are small ill-defined fluid collections within the pancreatic  tail. There is a complex peripherally enhancing fluid collection within the gastrocolic ligament measuring up to 7.9 x 2.4 cm on image 33/5. This is similar to the recent CT. The pancreas enhances homogeneously following contrast, without evidence of necrosis. Spleen: Normal in size without focal abnormality. Adrenals/Urinary Tract: Both adrenal glands appear normal. Both kidneys appear normal without hydronephrosis or mass lesion. Stomach/Bowel: Circumferential wall thickening of the distal stomach attributed to the adjacent pancreatic inflammation. No bowel wall thickening, distention or surrounding inflammatory changes. Vascular/Lymphatic: Multiple small lymph nodes within the base of the mesentery are stable. There is no retroperitoneal lymphadenopathy. No acute vascular findings are seen. There is aortic and branch vessel atherosclerosis. The portal and superior mesenteric veins are patent. There is chronic occlusion of the splenic vein with associated chronic collaterals. Other: Inflammatory changes around the pancreas, extending into the mesenteric  fat and left anterior pararenal space. No ascites. Musculoskeletal: No acute or significant osseous findings. Previous lower lumbar fusion. IMPRESSION: 1. Findings are most consistent with acute on chronic pancreatitis without significant change from CT of 3 days ago. There are inflammatory changes and ill-defined fluid collections within and surrounding the pancreas. No evidence of pancreatic necrosis. 2. Progressive biliary and pancreatic ductal dilatation compared with 2019 MRI. No evidence of choledocholithiasis or distinct pancreatic mass. These findings are likely due to pancreatic stricture with superimposed inflammation. Follow-up imaging with MRI or EUS after resolution of the acute episode recommended. 3. Stable wall thickening of the distal stomach attributed to pancreatic inflammation. 4. 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  Electronically Signed   By: Richardean Sale M.D.   On: 03/03/2019 19:28     Scheduled Meds: . aspirin  81 mg Oral Daily  . cholecalciferol  2,000 Units Oral Daily  . enoxaparin (LOVENOX) injection  40 mg Subcutaneous Q24H  . lipase/protease/amylase  36,000 Units Oral QID  . polyethylene glycol  17 g Oral Daily  . pregabalin  75 mg Oral QHS  . rosuvastatin  20 mg Oral QHS  . sertraline  75 mg Oral BID  . sodium chloride flush  3 mL Intravenous Once  . traZODone  150 mg Oral QHS   Continuous Infusions: . sodium chloride 75 mL/hr at 03/05/19 0425     LOS: 4 days    Time spent: 25 minutes spent in the coordination of care today.    Jonnie Finner, DO [facility]  If 7PM-7AM, please contact night-coverage [email or web address] 03/05/2019, 1:19 PM

## 2019-03-06 LAB — HEPATIC FUNCTION PANEL
ALT: 53 U/L — ABNORMAL HIGH (ref 0–44)
AST: 57 U/L — ABNORMAL HIGH (ref 15–41)
Albumin: 2.4 g/dL — ABNORMAL LOW (ref 3.5–5.0)
Alkaline Phosphatase: 340 U/L — ABNORMAL HIGH (ref 38–126)
Bilirubin, Direct: 4 mg/dL — ABNORMAL HIGH (ref 0.0–0.2)
Indirect Bilirubin: 2.8 mg/dL — ABNORMAL HIGH (ref 0.3–0.9)
Total Bilirubin: 6.8 mg/dL — ABNORMAL HIGH (ref 0.3–1.2)
Total Protein: 5.9 g/dL — ABNORMAL LOW (ref 6.5–8.1)

## 2019-03-06 LAB — BASIC METABOLIC PANEL
Anion gap: 10 (ref 5–15)
BUN: 5 mg/dL — ABNORMAL LOW (ref 6–20)
CO2: 29 mmol/L (ref 22–32)
Calcium: 8.9 mg/dL (ref 8.9–10.3)
Chloride: 101 mmol/L (ref 98–111)
Creatinine, Ser: 1.33 mg/dL — ABNORMAL HIGH (ref 0.61–1.24)
GFR calc Af Amer: >60 mL/min (ref >60)
GFR calc non Af Amer: 58 mL/min — ABNORMAL LOW (ref >60)
Glucose, Bld: 144 mg/dL — ABNORMAL HIGH (ref 70–99)
Potassium: 4 mmol/L (ref 3.5–5.1)
Sodium: 140 mmol/L (ref 135–145)

## 2019-03-06 LAB — CBC
HCT: 37.6 % — ABNORMAL LOW (ref 39.0–52.0)
Hemoglobin: 12.4 g/dL — ABNORMAL LOW (ref 13.0–17.0)
MCH: 30.9 pg (ref 26.0–34.0)
MCHC: 33 g/dL (ref 30.0–36.0)
MCV: 93.8 fL (ref 80.0–100.0)
Platelets: 230 10*3/uL (ref 150–400)
RBC: 4.01 MIL/uL — ABNORMAL LOW (ref 4.22–5.81)
RDW: 13.9 % (ref 11.5–15.5)
WBC: 10.5 10*3/uL (ref 4.0–10.5)
nRBC: 0 % (ref 0.0–0.2)

## 2019-03-06 LAB — MAGNESIUM: Magnesium: 2.1 mg/dL (ref 1.7–2.4)

## 2019-03-06 MED ORDER — PANCRELIPASE (LIP-PROT-AMYL) 36000-114000 UNITS PO CPEP
36000.0000 [IU] | ORAL_CAPSULE | Freq: Four times a day (QID) | ORAL | Status: DC
  Administered 2019-03-06 – 2019-03-07 (×3): 36000 [IU] via ORAL
  Administered 2019-03-07: 21:00:00 72000 [IU] via ORAL
  Administered 2019-03-07 (×2): 36000 [IU] via ORAL
  Administered 2019-03-08: 72000 [IU] via ORAL
  Filled 2019-03-06 (×10): qty 2

## 2019-03-06 NOTE — Plan of Care (Signed)

## 2019-03-06 NOTE — Progress Notes (Addendum)
PROGRESS NOTE    Leonard [name]  [id number] DOB: 28-Jun-1960 DOA: 02/28/2019 PCP: Gaynelle Arabian, MD Brief Narrative:58 year old male past medical history significant for alcohol abuse andacute and chronic pancreatitis. Patient follows up withDr. Michail Sermon GI.Patient was admitted with worsening abdominal pain. Apparently, patient had been having abdominal pain with nausea for about 2 months. CT scan done on outpatient basis confirmed pancreatitis. Patient was advised to come to the hospital for further assessment and management. Patient is currently being managed supportively. Patient underwent MRI of the abdomen. GI team is directing care. Patient was seen alongside patient's wife. Patient's wife was updated extensively.   Assessment & Plan:   Principal Problem:   Acute on chronic pancreatitis (HCC) Active Problems:   Elevated LFTs   Acute alcoholic pancreatitis   Chronic pancreatitis (HCC)   Alcohol dependence in remission ([place])    Acute on chronic pancreatitis w/ possible pseudocyst formation.     - Elevated LFT and bilirubin elevation due to pancreatitis and not due to choledocholithiasis.     - GI onboard, conservative treatment for now     - IV morphine, oxycodone.     - MRCP: IMPRESSION:         1. Findings are most consistent with acute on chronic pancreatitis without significant change from CT of 3 days ago. There are inflammatory changes and ill-defined fluid collections within and surrounding the pancreas. No evidence of pancreatic necrosis.         2. Progressive biliary and pancreatic ductal dilatation compared with 2019 MRI. No evidence of choledocholithiasis or distinct pancreatic mass. These findings are likely due to pancreatic stricture with superimposed inflammation. Follow-up imaging with MRI or EUS after resolution of the acute episode recommended.         3. Stable wall thickening of the distal stomach attributed to pancreatic inflammation.  4. 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     - now on CLD, monitor  Transaminitis AST 57 and ALT 53 up from 48 and 51 03/05/2019.    Numerous small mesenteric and retroperitoneal lymphadenopathy likely inflammatory.     - Monitor.  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     - Monitor for now.  Carotid artery dissection by history     - Monitor. History of alcohol abuse.     - previous Hx of heavy drinking, however, patient's wife reports that patient has not had alcohol since 2018.  Normocytic anemia     - no evidence of bleed     - monitor Hypotension he is not on any antihypertensives he still appears dry increase normal saline to 125 cc an hour.  Estimated body mass index is 26.57 kg/m as calculated from the following:   Height as of this encounter: 5\' 10"  (1.778 m).   Weight as of this encounter: 84 kg.  DVT prophylaxis: lovenox Code Status: FULL Family Communication: With family at bedside   Disposition Plan: TBD  Consultants:   GI  Subjective:  Resting in bed denies any nausea vomiting or bowel movements.  However he reports abdominal pain with drinking fluids. He reports his urine is still very dark. Objective: Vitals:   03/05/19 1304 03/05/19 2117 03/06/19 0537 03/06/19 0854  BP: 106/61 118/72 128/81 105/62  Pulse: 79 90 96 (!) 108  Resp: 18 17 18 18   Temp: 98.6 F (37 C) 99.5 F (37.5 C) 99.5 F (37.5 C) 97.7 F (36.5 C)  TempSrc: Oral Oral Oral Oral  SpO2:  98% 95% 90% 92%  Weight:      Height:       No intake or output data in the 24 hours ending 03/06/19 1051 Filed Weights   03/01/19 1027  Weight: 84 kg    Examination:  General exam: Appears calm and comfortable  Respiratory system: Clear to auscultation. Respiratory effort normal. Cardiovascular system: S1 & S2 heard, RRR. No JVD, murmurs, rubs, gallops or clicks. No pedal edema. Gastrointestinal system: Abdomen is distended, soft and  tender. No organomegaly or masses felt. Normal bowel sounds heard. Central nervous system: Alert and oriented. No focal neurological deficits. Extremities: Symmetric 5 x 5 power. Skin: No rashes, lesions or ulcers Psychiatry: Judgement and insight appear normal. Mood & affect appropriate.     Data Reviewed: I have personally reviewed following labs and imaging studies  CBC: Recent Labs  Lab 03/02/19 0338 03/03/19 0704 03/04/19 0550 03/05/19 0532 03/06/19 0320  WBC 6.4 8.9 8.0 8.9 10.5  HGB 11.6* 11.6* 12.1* 11.8* 12.4*  HCT 34.4* 35.6* 37.1* 35.7* 37.6*  MCV 92.5 93.4 93.7 94.2 93.8  PLT 222 224 226 196 [id number]   Basic Metabolic Panel: Recent Labs  Lab 03/01/19 0213 03/02/19 0338 03/03/19 0704 03/04/19 0550 03/05/19 0532 03/06/19 0320  NA  --  141 138 138 138 140  K  --  3.9 4.3 4.2 3.6 4.0  CL  --  106 102 101 98 101  CO2  --  24 22 27 30 29   GLUCOSE  --  72 101* 106* 128* 144*  BUN  --  8 7 6 7  5*  CREATININE  --  1.02 1.13 1.11 1.00 1.33*  CALCIUM  --  8.8* 8.7* 8.8* 8.5* 8.9  MG 2.2 2.1  --   --   --  2.1  PHOS 3.7  --   --   --   --   --    GFR: Estimated Creatinine Clearance: 61.7 mL/min (A) (by C-G formula based on SCr of 1.33 mg/dL (H)). Liver Function Tests: Recent Labs  Lab 03/01/19 0200 03/03/19 0704 03/04/19 0550 03/05/19 0532 03/06/19 0320  AST 83*  81* 49* 42* 48* 57*  ALT 97*  97* 63* 51* 51* 53*  ALKPHOS 577*  593* 496* 427* 365* 340*  BILITOT 6.8*  6.7* 5.5* 6.5* 7.5* 6.8*  PROT 6.6  6.7 6.0* 6.0* 5.8* 5.9*  ALBUMIN 3.1*  3.2* 2.8* 2.7* 2.5* 2.4*   Recent Labs  Lab 02/28/19 1614 03/02/19 0338 03/05/19 0532  LIPASE 315* 180* 61*   No results for input(s): AMMONIA in the last 168 hours. Coagulation Profile: Recent Labs  Lab 03/01/19 0200  INR 1.2   Cardiac Enzymes: No results for input(s): CKTOTAL, CKMB, CKMBINDEX, TROPONINI in the last 168 hours. BNP (last 3 results) No results for input(s): PROBNP in the last 8760  hours. HbA1C: No results for input(s): HGBA1C in the last 72 hours. CBG: No results for input(s): GLUCAP in the last 168 hours. Lipid Profile: No results for input(s): CHOL, HDL, LDLCALC, TRIG, CHOLHDL, LDLDIRECT in the last 72 hours. Thyroid Function Tests: No results for input(s): TSH, T4TOTAL, FREET4, T3FREE, THYROIDAB in the last 72 hours. Anemia Panel: No results for input(s): VITAMINB12, FOLATE, FERRITIN, TIBC, IRON, RETICCTPCT in the last 72 hours. Sepsis Labs: No results for input(s): PROCALCITON, LATICACIDVEN in the last 168 hours.  Recent Results (from the past 240 hour(s))  SARS CORONAVIRUS 2 (TAT 6-24 HRS) Nasopharyngeal Nasopharyngeal Swab     Status: None  Collection Time: 03/01/19  1:50 AM   Specimen: Nasopharyngeal Swab  Result Value Ref Range Status   SARS Coronavirus 2 NEGATIVE NEGATIVE Final    Comment: (NOTE) SARS-CoV-2 target nucleic acids are NOT DETECTED. The SARS-CoV-2 RNA is generally detectable in upper and lower respiratory specimens during the acute phase of infection. Negative results do not preclude SARS-CoV-2 infection, do not rule out co-infections with other pathogens, and should not be used as the sole basis for treatment or other patient management decisions. Negative results must be combined with clinical observations, patient history, and epidemiological information. The expected result is Negative. Fact Sheet for Patients: SugarRoll.be Fact Sheet for Healthcare Providers: https://www.woods-[employer] [employer].com/ This test is not yet approved or cleared by the [place] FDA and  has been authorized for detection and/or diagnosis of SARS-CoV-2 by FDA under an Emergency Use Authorization (EUA). This EUA will remain  in effect (meaning this test can be used) for the duration of the COVID-19 declaration under Section 56 4(b)(1) of the Act, 21 U.S.C. section 360bbb-3(b)(1), unless the authorization is terminated  or revoked sooner. Performed at [facility], [place] [address], [place], [place] [address]          Radiology Studies: No results found.      Scheduled Meds: . aspirin  81 mg Oral Daily  . cholecalciferol  2,000 Units Oral Daily  . enoxaparin (LOVENOX) injection  40 mg Subcutaneous Q24H  . lipase/protease/amylase  36,000 Units Oral QID  . polyethylene glycol  17 g Oral Daily  . pregabalin  75 mg Oral QHS  . rosuvastatin  20 mg Oral QHS  . sertraline  75 mg Oral BID  . sodium chloride flush  3 mL Intravenous Once  . traZODone  150 mg Oral QHS   Continuous Infusions: . sodium chloride 75 mL/hr at 03/06/19 0834     LOS: 5 days     Georgette Shell, MD [facility] If 7PM-7AM, please contact night-coverage [email or web address] Password [facility] 03/06/2019, 10:51 AM

## 2019-03-06 NOTE — Progress Notes (Signed)
[facility] Progress Note  Subjective: Patient seen in follow-up today in regards to pancreatitis.  He feels about the same.  Still has some upper abdominal discomfort.  Objective: Vital signs in last 24 hours: Temp:  [97.7 F (36.5 C)-99.5 F (37.5 C)] 97.7 F (36.5 C) (12/29 0854) Pulse Rate:  [79-108] 108 (12/29 0854) Resp:  [17-18] 18 (12/29 0854) BP: (105-128)/(61-81) 105/62 (12/29 0854) SpO2:  [90 %-98 %] 92 % (12/29 0854) Weight change:    PE:  Alert and oriented  No distress  Labs reviewed  Abdomen soft with tenderness in the upper abdomen  Lab Results: Results for orders placed or performed during the hospital encounter of 02/28/19 (from the past 24 hour(s))  Basic metabolic panel     Status: Abnormal   Collection Time: 03/06/19  3:20 AM  Result Value Ref Range   Sodium 140 135 - 145 mmol/L   Potassium 4.0 3.5 - 5.1 mmol/L   Chloride 101 98 - 111 mmol/L   CO2 29 22 - 32 mmol/L   Glucose, Bld 144 (H) 70 - 99 mg/dL   BUN 5 (L) 6 - 20 mg/dL   Creatinine, Ser 1.33 (H) 0.61 - 1.24 mg/dL   Calcium 8.9 8.9 - 10.3 mg/dL   GFR calc non Af Amer 58 (L) >60 mL/min   GFR calc Af Amer >60 >60 mL/min   Anion gap 10 5 - 15  CBC     Status: Abnormal   Collection Time: 03/06/19  3:20 AM  Result Value Ref Range   WBC 10.5 4.0 - 10.5 K/uL   RBC 4.01 (L) 4.22 - 5.81 MIL/uL   Hemoglobin 12.4 (L) 13.0 - 17.0 g/dL   HCT 37.6 (L) 39.0 - 52.0 %   MCV 93.8 80.0 - 100.0 fL   MCH 30.9 26.0 - 34.0 pg   MCHC 33.0 30.0 - 36.0 g/dL   RDW 13.9 11.5 - 15.5 %   Platelets 230 150 - 400 K/uL   nRBC 0.0 0.0 - 0.2 %  Hepatic function panel     Status: Abnormal   Collection Time: 03/06/19  3:20 AM  Result Value Ref Range   Total Protein 5.9 (L) 6.5 - 8.1 g/dL   Albumin 2.4 (L) 3.5 - 5.0 g/dL   AST 57 (H) 15 - 41 U/L   ALT 53 (H) 0 - 44 U/L   Alkaline Phosphatase 340 (H) 38 - 126 U/L   Total Bilirubin 6.8 (H) 0.3 - 1.2 mg/dL   Bilirubin, Direct 4.0 (H) 0.0 - 0.2 mg/dL   Indirect Bilirubin 2.8 (H) 0.3 - 0.9 mg/dL  Magnesium     Status: None   Collection Time: 03/06/19  3:20 AM  Result Value Ref Range   Magnesium 2.1 1.7 - 2.4 mg/dL    Studies/Results: No results found.    Assessment: Acute on chronic pancreatitis  Plan:   Continue supportive care    Cassell Clement 03/06/2019, 10:42 AM  Pager: [phone] If no answer or after 5 PM call [phone]

## 2019-03-07 LAB — HEPATIC FUNCTION PANEL
ALT: 47 U/L — ABNORMAL HIGH (ref 0–44)
AST: 58 U/L — ABNORMAL HIGH (ref 15–41)
Albumin: 2.1 g/dL — ABNORMAL LOW (ref 3.5–5.0)
Alkaline Phosphatase: 297 U/L — ABNORMAL HIGH (ref 38–126)
Bilirubin, Direct: 3.3 mg/dL — ABNORMAL HIGH (ref 0.0–0.2)
Indirect Bilirubin: 2.3 mg/dL — ABNORMAL HIGH (ref 0.3–0.9)
Total Bilirubin: 5.6 mg/dL — ABNORMAL HIGH (ref 0.3–1.2)
Total Protein: 5.5 g/dL — ABNORMAL LOW (ref 6.5–8.1)

## 2019-03-07 MED ORDER — ADULT MULTIVITAMIN W/MINERALS CH
1.0000 | ORAL_TABLET | Freq: Every day | ORAL | Status: DC
  Administered 2019-03-07 – 2019-03-08 (×2): 1 via ORAL
  Filled 2019-03-07 (×2): qty 1

## 2019-03-07 MED ORDER — BOOST / RESOURCE BREEZE PO LIQD CUSTOM
1.0000 | Freq: Three times a day (TID) | ORAL | Status: DC
  Administered 2019-03-07 – 2019-03-08 (×3): 1 via ORAL

## 2019-03-07 NOTE — Progress Notes (Signed)
[facility] Progress Note  Subjective: Patient was seen today and he states he is feeling much better today overall.  His abdominal pain has improved.  He has a smile on his face.  Objective: Vital signs in last 24 hours: Temp:  [98.1 F (36.7 C)-99.4 F (37.4 C)] 98.1 F (36.7 C) (12/30 0900) Pulse Rate:  [88-103] 92 (12/30 0900) Resp:  [14-18] 18 (12/30 0900) BP: (114-123)/(71-78) 123/78 (12/30 0900) SpO2:  [87 %-92 %] 92 % (12/30 0900) Weight change:    PE:  No distress  Heart regular rhythm  Abdomen soft with minimal tenderness in the upper abdomen  Lab Results: Results for orders placed or performed during the hospital encounter of 02/28/19 (from the past 24 hour(s))  Hepatic function panel     Status: Abnormal   Collection Time: 03/07/19  4:54 AM  Result Value Ref Range   Total Protein 5.5 (L) 6.5 - 8.1 g/dL   Albumin 2.1 (L) 3.5 - 5.0 g/dL   AST 58 (H) 15 - 41 U/L   ALT 47 (H) 0 - 44 U/L   Alkaline Phosphatase 297 (H) 38 - 126 U/L   Total Bilirubin 5.6 (H) 0.3 - 1.2 mg/dL   Bilirubin, Direct 3.3 (H) 0.0 - 0.2 mg/dL   Indirect Bilirubin 2.3 (H) 0.3 - 0.9 mg/dL    Studies/Results: No results found.    Assessment: Acute on chronic pancreatitis  Plan:   Continue current medical management    Cassell Clement 03/07/2019, 12:03 PM  Pager: [phone] If no answer or after 5 PM call [phone]

## 2019-03-07 NOTE — Progress Notes (Addendum)
PROGRESS NOTE    Leonard [name]  [id number] DOB: Mar 06, 1961 DOA: 02/28/2019 PCP: Gaynelle Arabian, MD    Brief Narrative: 58 year old male past medical history significant for alcohol abuse andacute and chronic pancreatitis. Patient follows up withDr. Michail Sermon GI.Patient was admitted with worsening abdominal pain. Apparently, patient had been having abdominal pain with nausea for about 2 months. CT scan done on outpatient basis confirmed pancreatitis. Patient was advised to come to the hospital for further assessment and management. Patient is currently being managed supportively. Patient underwent MRI of the abdomen. GI team is directing care. Patient was seen alongside patient's wife. Patient's wife was updated extensively.  Assessment & Plan:   Principal Problem:   Acute on chronic pancreatitis (HCC) Active Problems:   Elevated LFTs   Acute alcoholic pancreatitis   Chronic pancreatitis (HCC)   Alcohol dependence in remission ([place])   Acute on chronic pancreatitis w/ possible pseudocyst formation. - Elevated LFT and bilirubin elevation due to pancreatitis and not due to choledocholithiasis. - GI onboard, conservative treatment for now - IV morphine, oxycodone. He is not able to tolerate even clear liquids.  He has intractable nausea and vomiting.  No BM in a few days.  Complains of abdominal pain upon drinking clear liquids.  Continue IV fluids. - MRCP: IMPRESSION: 1. Findings are most consistent with acute on chronic pancreatitis without significant change from CT of 3 days ago. There are inflammatory changes and ill-defined fluid collections within and surrounding the pancreas. No evidence of pancreatic necrosis. 2. Progressive biliary and pancreatic ductal dilatation compared with 2019 MRI. No evidence of choledocholithiasis or distinct pancreatic mass. These findings are likely due to pancreatic stricture with superimposed  inflammation. Follow-up imaging with MRI or EUS after resolution of the acute episode recommended. 3. Stable wall thickening of the distal stomach attributed to pancreatic inflammation. 4. 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 - now on CLD, monitor  Transaminitis AST 58 and ALT 47 Alkaline phosphatase trending down to 297 from 340 yesterday. Numerous small mesenteric and retroperitoneal lymphadenopathy likely inflammatory. - Monitor.  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 - Monitor for now.  Carotid artery dissection by history - Monitor.  History of alcohol abuse. - previous Hx of heavy drinking, however, patient's wife reports that patient has not had alcohol since 2018.  Normocytic anemia - no evidence of bleed - monitor  Hypotension-blood pressure improved to 123/78 with IV fluids.    Increased creatinine creatinine 1.33 up from 1.0 will recheck in a.m. likely secondary to hypotension.  Estimated body mass index is 26.57 kg/m as calculated from the following:   Height as of this encounter: 5\' 10"  (1.778 m).   Weight as of this encounter: 84 kg. DVT prophylaxis:lovenox Code Status:FULL Family Communication:With family at bedside Disposition Plan:TBD  Consultants:  GI  Subjective: Patient with nausea and vomiting today not able to tolerate clear liquids on IV fluids.  Has not had a bowel movement in few days.  Objective: Vitals:   03/06/19 1705 03/06/19 2000 03/07/19 0339 03/07/19 0900  BP: 115/71 114/76 114/78 123/78  Pulse: 88 (!) 103 97 92  Resp: 18 14 14 18   Temp: 98.9 F (37.2 C) 99.4 F (37.4 C) 98.9 F (37.2 C) 98.1 F (36.7 C)  TempSrc: Oral Oral Oral Oral  SpO2: 91% (!) 87% 90% 92%  Weight:      Height:        Intake/Output Summary (Last 24 hours) at 03/07/2019 1036 Last data filed  at 03/07/2019 0900 Gross per 24 hour  Intake 1260 ml  Output 3  ml  Net 1257 ml   Filed Weights   03/01/19 1027  Weight: 84 kg    Examination:  General exam: Appears in mild distress due to abdominal pain Respiratory system: Clear to auscultation. Respiratory effort normal. Cardiovascular system: S1 & S2 heard, RRR. No JVD, murmurs, rubs, gallops or clicks. No pedal edema. Gastrointestinal system: Abdomen is distended soft and tender no organomegaly or masses felt. Normal bowel sounds heard. Central nervous system: Alert and oriented. No focal neurological deficits. Extremities: Symmetric 5 x 5 power. Skin: No rashes, lesions or ulcers Psychiatry: Judgement and insight appear normal. Mood & affect appropriate.     Data Reviewed: I have personally reviewed following labs and imaging studies  CBC: Recent Labs  Lab 03/02/19 0338 03/03/19 0704 03/04/19 0550 03/05/19 0532 03/06/19 0320  WBC 6.4 8.9 8.0 8.9 10.5  HGB 11.6* 11.6* 12.1* 11.8* 12.4*  HCT 34.4* 35.6* 37.1* 35.7* 37.6*  MCV 92.5 93.4 93.7 94.2 93.8  PLT 222 224 226 196 [id number]   Basic Metabolic Panel: Recent Labs  Lab 03/01/19 0213 03/02/19 0338 03/03/19 0704 03/04/19 0550 03/05/19 0532 03/06/19 0320  NA  --  141 138 138 138 140  K  --  3.9 4.3 4.2 3.6 4.0  CL  --  106 102 101 98 101  CO2  --  24 22 27 30 29   GLUCOSE  --  72 101* 106* 128* 144*  BUN  --  8 7 6 7  5*  CREATININE  --  1.02 1.13 1.11 1.00 1.33*  CALCIUM  --  8.8* 8.7* 8.8* 8.5* 8.9  MG 2.2 2.1  --   --   --  2.1  PHOS 3.7  --   --   --   --   --    GFR: Estimated Creatinine Clearance: 61.7 mL/min (A) (by C-G formula based on SCr of 1.33 mg/dL (H)). Liver Function Tests: Recent Labs  Lab 03/03/19 0704 03/04/19 0550 03/05/19 0532 03/06/19 0320 03/07/19 0454  AST 49* 42* 48* 57* 58*  ALT 63* 51* 51* 53* 47*  ALKPHOS 496* 427* 365* 340* 297*  BILITOT 5.5* 6.5* 7.5* 6.8* 5.6*  PROT 6.0* 6.0* 5.8* 5.9* 5.5*  ALBUMIN 2.8* 2.7* 2.5* 2.4* 2.1*   Recent Labs  Lab 02/28/19 1614 03/02/19 0338  03/05/19 0532  LIPASE 315* 180* 61*   No results for input(s): AMMONIA in the last 168 hours. Coagulation Profile: Recent Labs  Lab 03/01/19 0200  INR 1.2   Cardiac Enzymes: No results for input(s): CKTOTAL, CKMB, CKMBINDEX, TROPONINI in the last 168 hours. BNP (last 3 results) No results for input(s): PROBNP in the last 8760 hours. HbA1C: No results for input(s): HGBA1C in the last 72 hours. CBG: No results for input(s): GLUCAP in the last 168 hours. Lipid Profile: No results for input(s): CHOL, HDL, LDLCALC, TRIG, CHOLHDL, LDLDIRECT in the last 72 hours. Thyroid Function Tests: No results for input(s): TSH, T4TOTAL, FREET4, T3FREE, THYROIDAB in the last 72 hours. Anemia Panel: No results for input(s): VITAMINB12, FOLATE, FERRITIN, TIBC, IRON, RETICCTPCT in the last 72 hours. Sepsis Labs: No results for input(s): PROCALCITON, LATICACIDVEN in the last 168 hours.  Recent Results (from the past 240 hour(s))  SARS CORONAVIRUS 2 (TAT 6-24 HRS) Nasopharyngeal Nasopharyngeal Swab     Status: None   Collection Time: 03/01/19  1:50 AM   Specimen: Nasopharyngeal Swab  Result Value Ref Range Status  SARS Coronavirus 2 NEGATIVE NEGATIVE Final    Comment: (NOTE) SARS-CoV-2 target nucleic acids are NOT DETECTED. The SARS-CoV-2 RNA is generally detectable in upper and lower respiratory specimens during the acute phase of infection. Negative results do not preclude SARS-CoV-2 infection, do not rule out co-infections with other pathogens, and should not be used as the sole basis for treatment or other patient management decisions. Negative results must be combined with clinical observations, patient history, and epidemiological information. The expected result is Negative. Fact Sheet for Patients: SugarRoll.be Fact Sheet for Healthcare Providers: https://www.woods-[place] [employer].com/ This test is not yet approved or cleared by the [place] FDA  and  has been authorized for detection and/or diagnosis of SARS-CoV-2 by FDA under an Emergency Use Authorization (EUA). This EUA will remain  in effect (meaning this test can be used) for the duration of the COVID-19 declaration under Section 56 4(b)(1) of the Act, 21 U.S.C. section 360bbb-3(b)(1), unless the authorization is terminated or revoked sooner. Performed at [facility], [place] [address], [place], [place] [address]          Radiology Studies: No results found.      Scheduled Meds: . aspirin  81 mg Oral Daily  . cholecalciferol  2,000 Units Oral Daily  . enoxaparin (LOVENOX) injection  40 mg Subcutaneous Q24H  . lipase/protease/amylase  36,000-72,000 Units Oral QID  . polyethylene glycol  17 g Oral Daily  . pregabalin  75 mg Oral QHS  . rosuvastatin  20 mg Oral QHS  . sertraline  75 mg Oral BID  . sodium chloride flush  3 mL Intravenous Once  . traZODone  150 mg Oral QHS   Continuous Infusions: . sodium chloride 125 mL/hr at 03/07/19 0955     LOS: 6 days     Georgette Shell, MD [facility]  If 7PM-7AM, please contact night-coverage [email or web address] Password [email or web address] 03/07/2019, 10:36 AM

## 2019-03-07 NOTE — Progress Notes (Signed)
Initial Nutrition Assessment  RD working remotely.  DOCUMENTATION CODES:   Not applicable  INTERVENTION:   -Boost Breeze po TID, each supplement provides 250 kcal and 9 grams of protein -MVI with minerals daily -Pt has been without adequate nutrition during hospital stay (NPO/clear liquids x 6 days); if prolonged NPO/ clear liquid diet status is anticipated, consider initiation of nutrition support (enteral nutrition vs parenteral nutrition)   NUTRITION DIAGNOSIS:   Increased nutrient needs related to acute illness as evidenced by estimated needs.  GOAL:   Patient will meet greater than or equal to 90% of their needs  MONITOR:   PO intake, Supplement acceptance, Diet advancement, Labs, Weight trends, Skin, I & O's  REASON FOR ASSESSMENT:   NPO/Clear Liquid Diet    ASSESSMENT:   COLSYN MCGUFFEY is a 58 y.o. male with medical history significant of prior EtOH abuse, acute and chronic pancreatitis, follows with Dr. Michail Sermon GI.  Pt admitted with acute on chronic pancreatitis.   12/26- s/p MRI- revealed acute on chronic pancreatitis with inflammatory changes and ill-defined fluid collections within and surrounding the pancreas, progressive biliary and pancreatic ductal dilation compared to 2019 MRI- likely pancreatic stricture with superimposed inflammation; s/p ERCP- no evidenced of pancreatic necrosis and stable wall thickening of stomach related to pancreatic inflammation  Reviewed I/O's: +897 ml x 24 hours and +6.1 L since admission  UOP: 200 ml x 24 hours  Attempted to speak with pt x 2, however, no answer on room phone. RD unable to obtain further nutrition-related history at this time.   Reviewed wt hx; wt has been stable over the past year.   Per MD notes, pt with history of ETOH abuse, however, has been sober since 2018 per pt wife report.   Pt has received suboptimal nutrition since admission (6 days). Pt is currently on a clear liquid diet. Per GI notes,  feeling well today and tolerating clear liquids (however, long term clear liquid diet will not meet pt's nutritional needs). MD suggested temporary feeding tube on 03/05/19, however, pt declined. If unable to advance diet, may need to consider alternative means of nutrition and hydration.   Medications reviewed and include vitamin D3, creon, miralax, and sodium chloride infusion @ 125 ml/hr.   Labs reviewed.   Diet Order:   Diet Order            Diet clear liquid Room service appropriate? Yes; Fluid consistency: Thin  Diet effective now              EDUCATION NEEDS:   No education needs have been identified at this time  Skin:  Skin Assessment: Reviewed RN Assessment  Last BM:  02/27/19  Height:   Ht Readings from Last 1 Encounters:  03/01/19 5\' 10"  (1.778 m)    Weight:   Wt Readings from Last 1 Encounters:  03/01/19 84 kg    Ideal Body Weight:  75.5 kg  BMI:  Body mass index is 26.57 kg/m.  Estimated Nutritional Needs:   Kcal:  2250-2450  Protein:  115-130 grams  Fluid:  > 2.2 L    Chonda Baney A. Jimmye Norman, RD, LDN, [facility] Registered Dietitian II Certified Diabetes Care and Education Specialist Pager: [phone] After hours Pager: [phone]

## 2019-03-08 LAB — COMPREHENSIVE METABOLIC PANEL
ALT: 64 U/L — ABNORMAL HIGH (ref 0–44)
AST: 106 U/L — ABNORMAL HIGH (ref 15–41)
Albumin: 2 g/dL — ABNORMAL LOW (ref 3.5–5.0)
Alkaline Phosphatase: 471 U/L — ABNORMAL HIGH (ref 38–126)
Anion gap: 7 (ref 5–15)
BUN: 8 mg/dL (ref 6–20)
CO2: 29 mmol/L (ref 22–32)
Calcium: 8.3 mg/dL — ABNORMAL LOW (ref 8.9–10.3)
Chloride: 103 mmol/L (ref 98–111)
Creatinine, Ser: 0.97 mg/dL (ref 0.61–1.24)
GFR calc Af Amer: >60 mL/min (ref >60)
GFR calc non Af Amer: >60 mL/min (ref >60)
Glucose, Bld: 124 mg/dL — ABNORMAL HIGH (ref 70–99)
Potassium: 3.1 mmol/L — ABNORMAL LOW (ref 3.5–5.1)
Sodium: 139 mmol/L (ref 135–145)
Total Bilirubin: 6.3 mg/dL — ABNORMAL HIGH (ref 0.3–1.2)
Total Protein: 5.2 g/dL — ABNORMAL LOW (ref 6.5–8.1)

## 2019-03-08 LAB — CBC WITH DIFFERENTIAL/PLATELET
Abs Immature Granulocytes: 0.01 10*3/uL (ref 0.00–0.07)
Basophils Absolute: 0 10*3/uL (ref 0.0–0.1)
Basophils Relative: 0 %
Eosinophils Absolute: 0.3 10*3/uL (ref 0.0–0.5)
Eosinophils Relative: 5 %
HCT: 32.4 % — ABNORMAL LOW (ref 39.0–52.0)
Hemoglobin: 10.6 g/dL — ABNORMAL LOW (ref 13.0–17.0)
Immature Granulocytes: 0 %
Lymphocytes Relative: 13 %
Lymphs Abs: 0.7 10*3/uL (ref 0.7–4.0)
MCH: 30.6 pg (ref 26.0–34.0)
MCHC: 32.7 g/dL (ref 30.0–36.0)
MCV: 93.6 fL (ref 80.0–100.0)
Monocytes Absolute: 0.7 10*3/uL (ref 0.1–1.0)
Monocytes Relative: 13 %
Neutro Abs: 3.5 10*3/uL (ref 1.7–7.7)
Neutrophils Relative %: 69 %
Platelets: 234 10*3/uL (ref 150–400)
RBC: 3.46 MIL/uL — ABNORMAL LOW (ref 4.22–5.81)
RDW: 14.4 % (ref 11.5–15.5)
WBC: 5.2 10*3/uL (ref 4.0–10.5)
nRBC: 0 % (ref 0.0–0.2)

## 2019-03-08 MED ORDER — ONDANSETRON HCL 4 MG PO TABS: 4.0000 mg | ORAL_TABLET | Freq: Four times a day (QID) | ORAL | 0 refills | Status: DC | PRN

## 2019-03-08 MED ORDER — POLYETHYLENE GLYCOL 3350 17 G PO PACK: 17.0000 g | PACK | Freq: Every day | ORAL | 0 refills | Status: DC

## 2019-03-08 MED ORDER — PANCRELIPASE (LIP-PROT-AMYL) 36000-114000 UNITS PO CPEP: 36000.0000 [IU] | ORAL_CAPSULE | Freq: Four times a day (QID) | ORAL | 4 refills | Status: DC

## 2019-03-08 MED ORDER — ADULT MULTIVITAMIN W/MINERALS CH: 1.0000 | ORAL_TABLET | Freq: Every day | ORAL | Status: AC

## 2019-03-08 MED ORDER — POTASSIUM CHLORIDE 10 MEQ/100ML IV SOLN
10.0000 meq | INTRAVENOUS | Status: AC
  Administered 2019-03-08 (×4): 10 meq via INTRAVENOUS
  Filled 2019-03-08 (×4): qty 100

## 2019-03-08 NOTE — Discharge Instructions (Signed)
Please check CBC and CMP with magnesium next week 02/10/2019.

## 2019-03-08 NOTE — Discharge Summary (Signed)
Physician Discharge Summary  Leonard [name] [id number] DOB: 18-Nov-1960 DOA: 02/28/2019  PCP: Gaynelle Arabian, MD  Admit date: 02/28/2019 Discharge date: 03/08/2019  Admitted From: Home Disposition: Home Recommendations for Outpatient Follow-up:  1. Follow up with PCP in 1-2 weeks 2. Please obtain CMP/CBC in one week  Home Health none  equipment/Devices: None Discharge Condition: Stable and improved  CODE STATUS full code  diet recommendation: Cardiac Brief/Interim Summary: 58 year old male past medical history significant for alcohol abuse andacute and chronic pancreatitis. Patient follows up withDr. Michail Sermon [facility].Patient was admitted with worsening abdominal pain. Apparently, patient had been having abdominal pain with nausea for about 2 months. CT scan done on outpatient basis confirmed pancreatitis. Patient was advised to come to the hospital for further assessment and management. Patient is currently being managed supportively. Patient underwent MRI of the abdomen. GI team is directing care. Patient was seen alongside patient's wife. Patient's wife was updated extensively.  Discharge Diagnoses:  Principal Problem:   Acute on chronic pancreatitis (HCC) Active Problems:   Elevated LFTs   Acute alcoholic pancreatitis   Chronic pancreatitis (HCC)   Alcohol dependence in remission ([place])  Acute on chronic pancreatitis w/ possible pseudocyst formation. - Elevated LFT and bilirubin elevation due to pancreatitis and not due to choledocholithiasis. - MRCP: IMPRESSION: 1. Findings are most consistent with acute on chronic pancreatitis without significant change from CT of 3 days ago. There are inflammatory changes and ill-defined fluid collections within and surrounding the pancreas. No evidence of pancreatic necrosis. 2. Progressive biliary and pancreatic ductal dilatation compared with 2019 MRI. No evidence of choledocholithiasis or distinct  pancreatic mass. These findings are likely due to pancreatic stricture with superimposed inflammation. Follow-up imaging with MRI or EUS after resolution of the acute episode recommended. 3. Stable wall thickening of the distal stomach attributed to pancreatic inflammation. 4. 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 - now on CLD, monitor  Transaminitisand elevated alkaline phosphatase will need follow-up as an outpatient early next week.  . Numerous small mesenteric and retroperitoneal lymphadenopathy.  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  Carotid artery dissection by history  History of alcohol abuse. - previous Hx of heavy drinking, however, patient's wife reports that patient has not had alcohol since 2018.  Normocytic anemia - no evidence of bleed - monitor  Hypotension-blood pressure improved to 123/78 with IV fluids.    Increased creatinine creatinine 1.33 up from 1.0 will recheck in a.m. likely secondary to hypotension.  Creatinine 0.97 on discharge.    Nutrition Problem: Increased nutrient needs Etiology: acute illness    Signs/Symptoms: estimated needs     Interventions: Boost Breeze, MVI, Refer to RD note for recommendations  Estimated body mass index is 26.57 kg/m as calculated from the following:   Height as of this encounter: 5\' 10"  (1.778 m).   Weight as of this encounter: 84 kg.  Discharge Instructions  Discharge Instructions    Call MD for:  difficulty breathing, headache or visual disturbances   Complete by: As directed    Call MD for:  persistant nausea and vomiting   Complete by: As directed    Call MD for:  severe uncontrolled pain   Complete by: As directed    Call MD for:  temperature >100.4   Complete by: As directed    Diet - low sodium heart healthy   Complete by: As directed    Increase activity slowly   Complete by: As directed  Allergies as of  03/08/2019      Reactions   Sulfa Antibiotics Swelling, Rash, Other (See Comments)   Face swells, Welts   Zocor [simvastatin] Other (See Comments)   Elevated his liver enzymes      Medication List    TAKE these medications   aspirin 81 MG chewable tablet Chew 81 mg by mouth daily.   lipase/protease/amylase 36000 UNITS Cpep capsule Commonly known as: CREON Take 1-2 capsules (36,000-72,000 Units total) by mouth 4 (four) times daily.   multivitamin with minerals Tabs tablet Take 1 tablet by mouth daily. Start taking on: March 09, 2019   ondansetron 4 MG tablet Commonly known as: ZOFRAN Take 1 tablet (4 mg total) by mouth every 6 (six) hours as needed for nausea.   polyethylene glycol 17 g packet Commonly known as: MIRALAX / GLYCOLAX Take 17 g by mouth daily. Start taking on: March 09, 2019   pregabalin 75 MG capsule Commonly known as: LYRICA Take 75 mg by mouth at bedtime.   rosuvastatin 20 MG tablet Commonly known as: CRESTOR Take 20 mg by mouth at bedtime.   sertraline 50 MG tablet Commonly known as: ZOLOFT Take 75 mg by mouth 2 (two) times daily.   sildenafil 100 MG tablet Commonly known as: VIAGRA Take 100 mg by mouth daily as needed for erectile dysfunction.   traZODone 150 MG tablet Commonly known as: DESYREL Take 150 mg by mouth at bedtime.   Vitamin D3 50 MCG (2000 UT) Tabs Take 2,000 Units by mouth daily.      Follow-up Information    Gaynelle Arabian, MD Follow up.   Specialty: Family Medicine Contact information: [address], [address]  [place] [address] [phone]          Allergies  Allergen Reactions  . Sulfa Antibiotics Swelling, Rash and Other (See Comments)    Face swells, Welts  . Zocor [Simvastatin] Other (See Comments)    Elevated his liver enzymes    Consultations: Gastroenterology  Procedures/Studies: CT ABDOMEN PELVIS W CONTRAST  Result Date: 02/28/2019 CLINICAL DATA:  Acute on chronic pancreatitis.   Elevated lipase. EXAM: CT ABDOMEN AND PELVIS WITH CONTRAST TECHNIQUE: Multidetector CT imaging of the abdomen and pelvis was performed using the standard protocol following bolus administration of intravenous contrast. CONTRAST:  177mL ISOVUE-300 IOPAMIDOL (ISOVUE-300) INJECTION 61% COMPARISON:  MRI abdomen 10/31/2017 FINDINGS: Lower chest: The lung bases are grossly clear. No pleural effusions or pulmonary lesions. The heart is normal in size. No pericardial effusion. Hepatobiliary: Moderate progressive intra and extrahepatic biliary dilatation when compared to the most recent MRI from 2019. No focal hepatic lesions. The portal and hepatic veins are patent. The gallbladder appears normal. Pancreas: Changes of chronic calcific pancreatitis are again demonstrated with small cystic lesions in the head and tail regions. Progressive dilatation of the main pancreatic duct in the body region with fairly abrupt cut off in the head. Similar findings involving the common bile duct which is dilated up to 14 mm and has a fairly abrupt cut off in the pancreatic head. I do not see an obvious pancreatic mass and this could be scarring changes related to chronic pancreatitis. There are also changes of acute pancreatitis mainly in the body and tail regions with surrounding phlegmonous inflammatory changes and adjacent fluid collections in the small bowel mesentery which could be developing pseudocysts or abscesses depending on the patient's clinical situation. There is also adjacent inflammation of the posterior wall of the stomach with inflammatory changes in  the lesser sac. Small amount of fluid in the left anterior pararenal space. Numerous small but slightly enlarged peripancreatic and mesenteric lymph nodes, likely inflammatory. Spleen: Normal size. No focal lesions. 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 Adrenals/Urinary Tract: The adrenal glands and kidneys are unremarkable.  The bladder appears normal. Stomach/Bowel: Moderate inflammatory changes involving the posterior wall of the stomach. The duodenum and small bowel are unremarkable. No significant or acute colonic findings. Diffuse diverticulosis. Vascular/Lymphatic: Moderate atherosclerotic calcifications involving the aorta and iliac arteries. Mild tortuosity and ectasia of both iliac arteries. The branch vessels are patent. The major venous structures are patent. Numerous small mesenteric and retroperitoneal lymph nodes likely inflammatory. Reproductive: The prostate gland and seminal vesicles are unremarkable. Other: No pelvic mass or free pelvic fluid collections. No pelvic or inguinal adenopathy. Musculoskeletal: No significant bony findings. Lumbar fusion hardware noted. IMPRESSION: 1. Acute on chronic pancreatitis as detailed above. Fluid collections in the small bowel mesentery could be developing pseudocysts or abscesses in the right clinical situation. 2. Significant and progressive biliary and pancreatic duct dilatation with fairly abrupt cutoffs in the pancreatic head. No obvious pancreatic head mass. Findings could be due to stricturing due to chronic pancreatitis, compounded by acute inflammation. 3. Stable appearing intrapancreatic chronic pseudocysts. 4. Numerous small mesenteric and retroperitoneal lymph nodes, likely inflammatory. 5. 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 Electronically Signed   By: Marijo Sanes M.D.   On: 02/28/2019 10:31   MR 3D Recon At Scanner  Result Date: 03/03/2019 CLINICAL DATA:  Abdominal pain with nausea for 2 months. History of ethanol abuse with acute and chronic pancreatitis. Elevated transaminase levels, bilirubin and lipase. Biliary and pancreatic ductal dilatation on CT. EXAM: MRI ABDOMEN WITHOUT AND WITH CONTRAST (INCLUDING MRCP) TECHNIQUE: Multiplanar multisequence MR imaging of the abdomen was performed both before and  after the administration of intravenous contrast. Heavily T2-weighted images of the biliary and pancreatic ducts were obtained, and three-dimensional MRCP images were rendered by post processing. CONTRAST:  87mL GADAVIST GADOBUTROL 1 MMOL/ML IV SOLN COMPARISON:  Abdominal CT 02/28/2019. Abdominal MRI 10/31/2017. FINDINGS: Lower chest: Mild atelectasis at both lung bases. No significant pleural effusion. Hepatobiliary: No significant hepatic steatosis. No focal hepatic lesion or abnormal enhancement following contrast. There is no evidence of gallstones or gallbladder wall thickening. Moderate intrahepatic and extrahepatic biliary dilatation is present as seen on recent CT. This has progressed compared with previous MRI. The common hepatic duct measures up to 14 mm in diameter. No evidence of choledocholithiasis. There is a low insertion of the cystic duct. Pancreas: In correlation with previous studies, including the recent CT, there is evidence of acute on chronic pancreatitis. There is progressive dilatation of the main pancreatic duct within the pancreatic body and tail to 8 mm. There are multiple parenchymal calcifications throughout the pancreas on recent CT, especially in the uncinate process. On this study, there are 2 rounded areas of decreased enhancement inferiorly in the pancreatic tail; these are surrounded by calcifications on the recent CT. No pancreatic head mass identified. Extensive peripancreatic inflammatory changes are present as seen on recent CT. There are small ill-defined fluid collections within the pancreatic tail. There is a complex peripherally enhancing fluid collection within the gastrocolic ligament measuring up to 7.9 x 2.4 cm on image 33/5. This is similar to the recent CT. The pancreas enhances homogeneously following contrast, without evidence of necrosis. Spleen: Normal in size without focal abnormality. Adrenals/Urinary Tract: Both adrenal  glands appear normal. Both kidneys appear  normal without hydronephrosis or mass lesion. Stomach/Bowel: Circumferential wall thickening of the distal stomach attributed to the adjacent pancreatic inflammation. No bowel wall thickening, distention or surrounding inflammatory changes. Vascular/Lymphatic: Multiple small lymph nodes within the base of the mesentery are stable. There is no retroperitoneal lymphadenopathy. No acute vascular findings are seen. There is aortic and branch vessel atherosclerosis. The portal and superior mesenteric veins are patent. There is chronic occlusion of the splenic vein with associated chronic collaterals. Other: Inflammatory changes around the pancreas, extending into the mesenteric fat and left anterior pararenal space. No ascites. Musculoskeletal: No acute or significant osseous findings. Previous lower lumbar fusion. IMPRESSION: 1. Findings are most consistent with acute on chronic pancreatitis without significant change from CT of 3 days ago. There are inflammatory changes and ill-defined fluid collections within and surrounding the pancreas. No evidence of pancreatic necrosis. 2. Progressive biliary and pancreatic ductal dilatation compared with 2019 MRI. No evidence of choledocholithiasis or distinct pancreatic mass. These findings are likely due to pancreatic stricture with superimposed inflammation. Follow-up imaging with MRI or EUS after resolution of the acute episode recommended. 3. Stable wall thickening of the distal stomach attributed to pancreatic inflammation. 4. 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 Electronically Signed   By: Richardean Sale M.D.   On: 03/03/2019 19:28   DG Abd Portable 1V  Result Date: 03/03/2019 CLINICAL DATA:  58 year old male with abdominal pain. Acute on chronic pancreatitis. EXAM: PORTABLE ABDOMEN - 1 VIEW COMPARISON:  CT Abdomen and Pelvis 02/28/2019. FINDINGS: Portable AP views of the abdomen and pelvis at 1305 hours. Non obstructed bowel gas pattern.  Epigastric pancreatic parenchymal calcifications were better demonstrated by CT. Otherwise abdominal and pelvic visceral contours are within normal limits by x-ray; right hemipelvis phlebolith again noted. Stable visualized osseous structures. Previous lumbar fusion. Lung bases not included. IMPRESSION: 1. Normal bowel gas pattern. 2. Dystrophic pancreatic calcifications.  As seen recently by CT. Electronically Signed   By: Genevie Ann M.D.   On: 03/03/2019 13:58   MR ABDOMEN MRCP W WO CONTAST  Result Date: 03/03/2019 CLINICAL DATA:  Abdominal pain with nausea for 2 months. History of ethanol abuse with acute and chronic pancreatitis. Elevated transaminase levels, bilirubin and lipase. Biliary and pancreatic ductal dilatation on CT. EXAM: MRI ABDOMEN WITHOUT AND WITH CONTRAST (INCLUDING MRCP) TECHNIQUE: Multiplanar multisequence MR imaging of the abdomen was performed both before and after the administration of intravenous contrast. Heavily T2-weighted images of the biliary and pancreatic ducts were obtained, and three-dimensional MRCP images were rendered by post processing. CONTRAST:  26mL GADAVIST GADOBUTROL 1 MMOL/ML IV SOLN COMPARISON:  Abdominal CT 02/28/2019. Abdominal MRI 10/31/2017. FINDINGS: Lower chest: Mild atelectasis at both lung bases. No significant pleural effusion. Hepatobiliary: No significant hepatic steatosis. No focal hepatic lesion or abnormal enhancement following contrast. There is no evidence of gallstones or gallbladder wall thickening. Moderate intrahepatic and extrahepatic biliary dilatation is present as seen on recent CT. This has progressed compared with previous MRI. The common hepatic duct measures up to 14 mm in diameter. No evidence of choledocholithiasis. There is a low insertion of the cystic duct. Pancreas: In correlation with previous studies, including the recent CT, there is evidence of acute on chronic pancreatitis. There is progressive dilatation of the main pancreatic  duct within the pancreatic body and tail to 8 mm. There are multiple parenchymal calcifications throughout the pancreas on recent CT, especially in the uncinate process. On this study, there are 2  rounded areas of decreased enhancement inferiorly in the pancreatic tail; these are surrounded by calcifications on the recent CT. No pancreatic head mass identified. Extensive peripancreatic inflammatory changes are present as seen on recent CT. There are small ill-defined fluid collections within the pancreatic tail. There is a complex peripherally enhancing fluid collection within the gastrocolic ligament measuring up to 7.9 x 2.4 cm on image 33/5. This is similar to the recent CT. The pancreas enhances homogeneously following contrast, without evidence of necrosis. Spleen: Normal in size without focal abnormality. Adrenals/Urinary Tract: Both adrenal glands appear normal. Both kidneys appear normal without hydronephrosis or mass lesion. Stomach/Bowel: Circumferential wall thickening of the distal stomach attributed to the adjacent pancreatic inflammation. No bowel wall thickening, distention or surrounding inflammatory changes. Vascular/Lymphatic: Multiple small lymph nodes within the base of the mesentery are stable. There is no retroperitoneal lymphadenopathy. No acute vascular findings are seen. There is aortic and branch vessel atherosclerosis. The portal and superior mesenteric veins are patent. There is chronic occlusion of the splenic vein with associated chronic collaterals. Other: Inflammatory changes around the pancreas, extending into the mesenteric fat and left anterior pararenal space. No ascites. Musculoskeletal: No acute or significant osseous findings. Previous lower lumbar fusion. IMPRESSION: 1. Findings are most consistent with acute on chronic pancreatitis without significant change from CT of 3 days ago. There are inflammatory changes and ill-defined fluid collections within and surrounding the  pancreas. No evidence of pancreatic necrosis. 2. Progressive biliary and pancreatic ductal dilatation compared with 2019 MRI. No evidence of choledocholithiasis or distinct pancreatic mass. These findings are likely due to pancreatic stricture with superimposed inflammation. Follow-up imaging with MRI or EUS after resolution of the acute episode recommended. 3. Stable wall thickening of the distal stomach attributed to pancreatic inflammation. 4. 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 Electronically Signed   By: Richardean Sale M.D.   On: 03/03/2019 19:28    (Echo, Carotid, EGD, Colonoscopy, ERCP)    Subjective:   Discharge Exam: Vitals:   03/08/19 0355 03/08/19 0856  BP: 127/81 118/81  Pulse: 93 96  Resp: 14 17  Temp: 99.1 F (37.3 C) 99.1 F (37.3 C)  SpO2: 92% 92%   Vitals:   03/07/19 1435 03/07/19 1946 03/08/19 0355 03/08/19 0856  BP: 123/74 125/76 127/81 118/81  Pulse: 87 96 93 96  Resp: 18 14 14 17   Temp: 98.6 F (37 C) 98.2 F (36.8 C) 99.1 F (37.3 C) 99.1 F (37.3 C)  TempSrc: Oral Oral Oral Oral  SpO2: 94% 92% 92% 92%  Weight:      Height:        General: Pt is alert, awake, not in acute distress Cardiovascular: RRR, S1/S2 +, no rubs, no gallops Respiratory: CTA bilaterally, no wheezing, no rhonchi Abdominal: Soft, NT, ND, bowel sounds + Extremities: no edema, no cyanosis    The results of significant diagnostics from this hospitalization (including imaging, microbiology, ancillary and laboratory) are listed below for reference.     Microbiology: Recent Results (from the past 240 hour(s))  SARS CORONAVIRUS 2 (TAT 6-24 HRS) Nasopharyngeal Nasopharyngeal Swab     Status: None   Collection Time: 03/01/19  1:50 AM   Specimen: Nasopharyngeal Swab  Result Value Ref Range Status   SARS Coronavirus 2 NEGATIVE NEGATIVE Final    Comment: (NOTE) SARS-CoV-2 target nucleic acids are NOT DETECTED. The SARS-CoV-2 RNA is generally detectable in  upper and lower respiratory specimens during the acute phase of infection. Negative results do not preclude  SARS-CoV-2 infection, do not rule out co-infections with other pathogens, and should not be used as the sole basis for treatment or other patient management decisions. Negative results must be combined with clinical observations, patient history, and epidemiological information. The expected result is Negative. Fact Sheet for Patients: SugarRoll.be Fact Sheet for Healthcare Providers: https://www.woods-.com/ This test is not yet approved or cleared by the [place] FDA and  has been authorized for detection and/or diagnosis of SARS-CoV-2 by FDA under an Emergency Use Authorization (EUA). This EUA will remain  in effect (meaning this test can be used) for the duration of the COVID-19 declaration under Section 56 4(b)(1) of the Act, 21 U.S.C. section 360bbb-3(b)(1), unless the authorization is terminated or revoked sooner. Performed at [facility], [place] [address] [place], [place] [address]      Labs: BNP (last 3 results) No results for input(s): BNP in the last 8760 hours. Basic Metabolic Panel: Recent Labs  Lab 03/02/19 0338 03/03/19 0704 03/04/19 0550 03/05/19 0532 03/06/19 0320 03/08/19 0204  NA 141 138 138 138 140 139  K 3.9 4.3 4.2 3.6 4.0 3.1*  CL 106 102 101 98 101 103  CO2 24 22 27 30 29 29   GLUCOSE 72 101* 106* 128* 144* 124*  BUN 8 7 6 7  5* 8  CREATININE 1.02 1.13 1.11 1.00 1.33* 0.97  CALCIUM 8.8* 8.7* 8.8* 8.5* 8.9 8.3*  MG 2.1  --   --   --  2.1  --    Liver Function Tests: Recent Labs  Lab 03/04/19 0550 03/05/19 0532 03/06/19 0320 03/07/19 0454 03/08/19 0204  AST 42* 48* 57* 58* 106*  ALT 51* 51* 53* 47* 64*  ALKPHOS 427* 365* 340* 297* 471*  BILITOT 6.5* 7.5* 6.8* 5.6* 6.3*  PROT 6.0* 5.8* 5.9* 5.5* 5.2*  ALBUMIN 2.7* 2.5* 2.4* 2.1* 2.0*   Recent Labs  Lab 03/02/19 0338  03/05/19 0532  LIPASE 180* 61*   No results for input(s): AMMONIA in the last 168 hours. CBC: Recent Labs  Lab 03/03/19 0704 03/04/19 0550 03/05/19 0532 03/06/19 0320 03/08/19 0204  WBC 8.9 8.0 8.9 10.5 5.2  NEUTROABS  --   --   --   --  3.5  HGB 11.6* 12.1* 11.8* 12.4* 10.6*  HCT 35.6* 37.1* 35.7* 37.6* 32.4*  MCV 93.4 93.7 94.2 93.8 93.6  PLT 224 226 196 230 234   Cardiac Enzymes: No results for input(s): CKTOTAL, CKMB, CKMBINDEX, TROPONINI in the last 168 hours. BNP: Invalid input(s): POCBNP CBG: No results for input(s): GLUCAP in the last 168 hours. D-Dimer No results for input(s): DDIMER in the last 72 hours. Hgb A1c No results for input(s): HGBA1C in the last 72 hours. Lipid Profile No results for input(s): CHOL, HDL, LDLCALC, TRIG, CHOLHDL, LDLDIRECT in the last 72 hours. Thyroid function studies No results for input(s): TSH, T4TOTAL, T3FREE, THYROIDAB in the last 72 hours.  Invalid input(s): FREET3 Anemia work up No results for input(s): VITAMINB12, FOLATE, FERRITIN, TIBC, IRON, RETICCTPCT in the last 72 hours. Urinalysis    Component Value Date/Time   COLORURINE AMBER (A) 02/28/2019 1603   APPEARANCEUR CLEAR 02/28/2019 1603   LABSPEC 1.032 (H) 02/28/2019 1603   PHURINE 6.0 02/28/2019 1603   GLUCOSEU NEGATIVE 02/28/2019 1603   HGBUR NEGATIVE 02/28/2019 1603   BILIRUBINUR NEGATIVE 02/28/2019 1603   KETONESUR NEGATIVE 02/28/2019 1603   PROTEINUR NEGATIVE 02/28/2019 1603   NITRITE NEGATIVE 02/28/2019 1603   LEUKOCYTESUR NEGATIVE 02/28/2019 1603   Sepsis Labs  Invalid input(s): PROCALCITONIN,  WBC,  LACTICIDVEN Microbiology Recent Results (from the past 240 hour(s))  SARS CORONAVIRUS 2 (TAT 6-24 HRS) Nasopharyngeal Nasopharyngeal Swab     Status: None   Collection Time: 03/01/19  1:50 AM   Specimen: Nasopharyngeal Swab  Result Value Ref Range Status   SARS Coronavirus 2 NEGATIVE NEGATIVE Final    Comment: (NOTE) SARS-CoV-2 target nucleic acids are NOT  DETECTED. The SARS-CoV-2 RNA is generally detectable in upper and lower respiratory specimens during the acute phase of infection. Negative results do not preclude SARS-CoV-2 infection, do not rule out co-infections with other pathogens, and should not be used as the sole basis for treatment or other patient management decisions. Negative results must be combined with clinical observations, patient history, and epidemiological information. The expected result is Negative. Fact Sheet for Patients: SugarRoll.be Fact Sheet for Healthcare Providers: https://www.woods-.com/ This test is not yet approved or cleared by the [place] FDA and  has been authorized for detection and/or diagnosis of SARS-CoV-2 by FDA under an Emergency Use Authorization (EUA). This EUA will remain  in effect (meaning this test can be used) for the duration of the COVID-19 declaration under Section 56 4(b)(1) of the Act, 21 U.S.C. section 360bbb-3(b)(1), unless the authorization is terminated or revoked sooner. Performed at [facility], [place] [address], [place], [place] [address]      Time coordinating discharge:  38 minutes  SIGNED:   Georgette Shell, MD  [facility] 03/08/2019, 10:52 AM Pager   If 7PM-7AM, please contact night-coverage [email or web address] Password [email or web address]

## 2019-03-08 NOTE — Plan of Care (Signed)

## 2019-03-08 NOTE — Progress Notes (Signed)
Provided discharge education/instructions, all questions and concerns addressed, Pt not in distress, discharged home with belongings. 

## 2019-03-14 ENCOUNTER — Other Ambulatory Visit: Payer: Self-pay | Admitting: Gastroenterology

## 2019-03-14 DIAGNOSIS — K861 Other chronic pancreatitis: Secondary | ICD-10-CM

## 2019-03-16 ENCOUNTER — Other Ambulatory Visit: Payer: Self-pay | Admitting: Gastroenterology

## 2019-03-17 ENCOUNTER — Other Ambulatory Visit (HOSPITAL_COMMUNITY)
Admission: RE | Admit: 2019-03-17 | Discharge: 2019-03-17 | Disposition: A | Discharge status: Home / Self Care | Payer: Managed Care, Other (non HMO) | Source: Ambulatory Visit | Attending: Gastroenterology | Admitting: Gastroenterology

## 2019-03-17 DIAGNOSIS — Z01812 Encounter for preprocedural laboratory examination: Secondary | ICD-10-CM | POA: Insufficient documentation

## 2019-03-17 DIAGNOSIS — Z20822 Contact with and (suspected) exposure to covid-19: Secondary | ICD-10-CM | POA: Insufficient documentation

## 2019-03-18 LAB — NOVEL CORONAVIRUS, NAA (HOSP ORDER, SEND-OUT TO REF LAB; TAT 18-24 HRS): SARS-CoV-2, NAA: NOT DETECTED

## 2019-03-19 ENCOUNTER — Ambulatory Visit
Admission: RE | Admit: 2019-03-19 | Discharge: 2019-03-19 | Disposition: A | Discharge status: Home / Self Care | Payer: Managed Care, Other (non HMO) | Source: Ambulatory Visit | Attending: Gastroenterology | Admitting: Gastroenterology

## 2019-03-19 DIAGNOSIS — K861 Other chronic pancreatitis: Secondary | ICD-10-CM

## 2019-03-19 MED ORDER — IOPAMIDOL (ISOVUE-300) INJECTION 61%
100.0000 mL | Freq: Once | INTRAVENOUS | Status: AC | PRN
  Administered 2019-03-19: 12:00:00 100 mL via INTRAVENOUS

## 2019-03-20 NOTE — Progress Notes (Signed)
Spoke with patient, has quarantined without symptoms.  Answered questions regarding tomorrows procedure, arrival time 1100 am.

## 2019-03-21 ENCOUNTER — Ambulatory Visit (HOSPITAL_COMMUNITY)
Admission: RE | Admit: 2019-03-21 | Discharge: 2019-03-21 | Disposition: A | Discharge status: Home / Self Care | Payer: Managed Care, Other (non HMO) | Attending: Gastroenterology | Admitting: Gastroenterology

## 2019-03-21 ENCOUNTER — Other Ambulatory Visit: Payer: Self-pay

## 2019-03-21 ENCOUNTER — Ambulatory Visit (HOSPITAL_COMMUNITY): Payer: Managed Care, Other (non HMO)

## 2019-03-21 ENCOUNTER — Ambulatory Visit (HOSPITAL_COMMUNITY): Payer: Managed Care, Other (non HMO) | Admitting: Anesthesiology

## 2019-03-21 ENCOUNTER — Encounter (HOSPITAL_COMMUNITY): Payer: Self-pay | Admitting: Gastroenterology

## 2019-03-21 ENCOUNTER — Encounter (HOSPITAL_COMMUNITY)
Admission: RE | Disposition: A | Discharge status: Home / Self Care | Payer: Self-pay | Source: Home / Self Care | Attending: Gastroenterology

## 2019-03-21 DIAGNOSIS — Z85828 Personal history of other malignant neoplasm of skin: Secondary | ICD-10-CM | POA: Insufficient documentation

## 2019-03-21 DIAGNOSIS — K831 Obstruction of bile duct: Secondary | ICD-10-CM | POA: Insufficient documentation

## 2019-03-21 DIAGNOSIS — R17 Unspecified jaundice: Secondary | ICD-10-CM | POA: Insufficient documentation

## 2019-03-21 DIAGNOSIS — E559 Vitamin D deficiency, unspecified: Secondary | ICD-10-CM | POA: Insufficient documentation

## 2019-03-21 DIAGNOSIS — K861 Other chronic pancreatitis: Secondary | ICD-10-CM | POA: Diagnosis not present

## 2019-03-21 DIAGNOSIS — Z87891 Personal history of nicotine dependence: Secondary | ICD-10-CM | POA: Diagnosis not present

## 2019-03-21 DIAGNOSIS — Z7982 Long term (current) use of aspirin: Secondary | ICD-10-CM | POA: Diagnosis not present

## 2019-03-21 DIAGNOSIS — E7800 Pure hypercholesterolemia, unspecified: Secondary | ICD-10-CM | POA: Insufficient documentation

## 2019-03-21 DIAGNOSIS — F419 Anxiety disorder, unspecified: Secondary | ICD-10-CM | POA: Diagnosis not present

## 2019-03-21 DIAGNOSIS — K838 Other specified diseases of biliary tract: Secondary | ICD-10-CM | POA: Diagnosis present

## 2019-03-21 DIAGNOSIS — K859 Acute pancreatitis without necrosis or infection, unspecified: Secondary | ICD-10-CM

## 2019-03-21 HISTORY — PX: SPHINCTEROTOMY: SHX5544

## 2019-03-21 HISTORY — PX: BILIARY STENT PLACEMENT: SHX5538

## 2019-03-21 HISTORY — PX: ERCP: SHX5425

## 2019-03-21 SURGERY — ERCP, WITH INTERVENTION IF INDICATED
Anesthesia: General

## 2019-03-21 MED ORDER — LACTATED RINGERS IV SOLN
INTRAVENOUS | Status: DC
  Administered 2019-03-21: 1000 mL via INTRAVENOUS

## 2019-03-21 MED ORDER — GLUCAGON HCL RDNA (DIAGNOSTIC) 1 MG IJ SOLR
INTRAMUSCULAR | Status: AC
  Filled 2019-03-21: qty 1

## 2019-03-21 MED ORDER — PHENYLEPHRINE 40 MCG/ML (10ML) SYRINGE FOR IV PUSH (FOR BLOOD PRESSURE SUPPORT)
PREFILLED_SYRINGE | INTRAVENOUS | Status: DC | PRN
  Administered 2019-03-21 (×8): 80 ug via INTRAVENOUS

## 2019-03-21 MED ORDER — CIPROFLOXACIN IN D5W 400 MG/200ML IV SOLN
INTRAVENOUS | Status: DC | PRN
  Administered 2019-03-21: 400 mg via INTRAVENOUS

## 2019-03-21 MED ORDER — SUGAMMADEX SODIUM 200 MG/2ML IV SOLN
INTRAVENOUS | Status: DC | PRN
  Administered 2019-03-21: 350 mg via INTRAVENOUS

## 2019-03-21 MED ORDER — MIDAZOLAM HCL 2 MG/2ML IJ SOLN
INTRAMUSCULAR | Status: AC
  Filled 2019-03-21: qty 2

## 2019-03-21 MED ORDER — INDOMETHACIN 50 MG RE SUPP
RECTAL | Status: DC | PRN
  Administered 2019-03-21: 100 mg via RECTAL

## 2019-03-21 MED ORDER — PROPOFOL 10 MG/ML IV BOLUS
INTRAVENOUS | Status: DC | PRN
  Administered 2019-03-21: 150 mg via INTRAVENOUS

## 2019-03-21 MED ORDER — CIPROFLOXACIN IN D5W 400 MG/200ML IV SOLN
INTRAVENOUS | Status: AC
  Filled 2019-03-21: qty 200

## 2019-03-21 MED ORDER — SODIUM CHLORIDE 0.9 % IV SOLN: INTRAVENOUS | Status: DC

## 2019-03-21 MED ORDER — LIDOCAINE 2% (20 MG/ML) 5 ML SYRINGE
INTRAMUSCULAR | Status: DC | PRN
  Administered 2019-03-21: 100 mg via INTRAVENOUS

## 2019-03-21 MED ORDER — FENTANYL CITRATE (PF) 250 MCG/5ML IJ SOLN
INTRAMUSCULAR | Status: DC | PRN
  Administered 2019-03-21: 50 ug via INTRAVENOUS

## 2019-03-21 MED ORDER — DEXAMETHASONE SODIUM PHOSPHATE 10 MG/ML IJ SOLN
INTRAMUSCULAR | Status: DC | PRN
  Administered 2019-03-21: 10 mg via INTRAVENOUS

## 2019-03-21 MED ORDER — MIDAZOLAM HCL 5 MG/5ML IJ SOLN
INTRAMUSCULAR | Status: DC | PRN
  Administered 2019-03-21: 2 mg via INTRAVENOUS

## 2019-03-21 MED ORDER — SODIUM CHLORIDE 0.9 % IV SOLN
INTRAVENOUS | Status: DC | PRN
  Administered 2019-03-21: 12:00:00 60 mL

## 2019-03-21 MED ORDER — ROCURONIUM BROMIDE 10 MG/ML (PF) SYRINGE
PREFILLED_SYRINGE | INTRAVENOUS | Status: DC | PRN
  Administered 2019-03-21: 60 mg via INTRAVENOUS

## 2019-03-21 MED ORDER — ONDANSETRON HCL 4 MG/2ML IJ SOLN
INTRAMUSCULAR | Status: DC | PRN
  Administered 2019-03-21: 4 mg via INTRAVENOUS

## 2019-03-21 MED ORDER — INDOMETHACIN 50 MG RE SUPP
RECTAL | Status: AC
  Filled 2019-03-21: qty 2

## 2019-03-21 MED ORDER — PROPOFOL 10 MG/ML IV BOLUS
INTRAVENOUS | Status: AC
  Filled 2019-03-21: qty 20

## 2019-03-21 MED ORDER — FENTANYL CITRATE (PF) 250 MCG/5ML IJ SOLN
INTRAMUSCULAR | Status: AC
  Filled 2019-03-21: qty 5

## 2019-03-21 NOTE — Transfer of Care (Signed)
Immediate Anesthesia Transfer of Care Note  Patient: Leonard [name]  Procedure(s) Performed: ENDOSCOPIC RETROGRADE CHOLANGIOPANCREATOGRAPHY (ERCP) with stent placement (N/A )  Patient Location: PACU  Anesthesia Type:General  Level of Consciousness: awake, alert  and oriented  Airway & Oxygen Therapy: Patient Spontanous Breathing and Patient connected to face mask oxygen  Post-op Assessment: Report given to RN and Post -op Vital signs reviewed and stable  Post vital signs: Reviewed and stable  Last Vitals:  Vitals Value Taken Time  BP 108/62 03/21/19 1210  Temp    Pulse 77 03/21/19 1211  Resp 19 03/21/19 1211  SpO2 100 % 03/21/19 1211  Vitals shown include unvalidated device data.  Last Pain:  Vitals:   03/21/19 0953  TempSrc: Oral  PainSc: 5       Patients Stated Pain Goal: 2 ([id number])  Complications: No apparent anesthesia complications

## 2019-03-21 NOTE — Discharge Instructions (Signed)
Call if question or problem otherwise clear liquids only today and if doing well at 6 PM may try soft solids and repeat labs in 1 week and follow-up with Dr. Michail Sermon in 2 weeks but call sooner if increased pain fever signs of GI bleeding or other question or problems as above

## 2019-03-21 NOTE — Anesthesia Preprocedure Evaluation (Signed)
Anesthesia Evaluation  Patient identified by MRN, date of birth, ID band Patient awake    Reviewed: Allergy & Precautions, NPO status , Patient's Chart, lab work & pertinent test results  History of Anesthesia Complications (+) PONV  Airway Mallampati: II  TM Distance: >3 FB Neck ROM: Full    Dental no notable dental hx.    Pulmonary neg pulmonary ROS, former smoker,    Pulmonary exam normal breath sounds clear to auscultation       Cardiovascular negative cardio ROS Normal cardiovascular exam Rhythm:Regular Rate:Normal     Neuro/Psych negative neurological ROS  negative psych ROS   GI/Hepatic negative GI ROS, Pancreatitis   Endo/Other  negative endocrine ROS  Renal/GU negative Renal ROS  negative genitourinary   Musculoskeletal negative musculoskeletal ROS (+)   Abdominal   Peds negative pediatric ROS (+)  Hematology  (+) anemia ,   Anesthesia Other Findings   Reproductive/Obstetrics negative OB ROS                             Anesthesia Physical Anesthesia Plan  ASA: III  Anesthesia Plan: General   Post-op Pain Management:    Induction: Intravenous  PONV Risk Score and Plan: 3 and Ondansetron, Dexamethasone, Treatment may vary due to age or medical condition and Midazolam  Airway Management Planned: Oral ETT  Additional Equipment:   Intra-op Plan:   Post-operative Plan: Extubation in OR  Informed Consent: I have reviewed the patients History and Physical, chart, labs and discussed the procedure including the risks, benefits and alternatives for the proposed anesthesia with the patient or authorized representative who has indicated his/her understanding and acceptance.     Dental advisory given  Plan Discussed with: CRNA and Surgeon  Anesthesia Plan Comments:         Anesthesia Quick Evaluation

## 2019-03-21 NOTE — Op Note (Signed)
[facility] Patient Name: Leonard [name] Procedure Date: 03/21/2019 MRN: [id number] Attending MD: Clarene Essex , MD Date of Birth: 1960-08-18 CSN: [id number] Age: 59 Admit Type: Inpatient Procedure:                ERCP Indications:              Biliary dilation on Computed Tomogram Scan,                            Jaundice, Chronic recurrent pancreatitis Providers:                Clarene Essex, MD, Cleda Daub, RN, Elspeth Cho                            Tech., Technician, Corie Chiquito, Technician, Marla Roe, CRNA, Elmer Ramp. Tilden Dome, RN Referring MD:              Medicines:                General Anesthesia Complications:            No immediate complications. Estimated Blood Loss:     Estimated blood loss: none. Procedure:                Pre-Anesthesia Assessment:                           - Prior to the procedure, a History and Physical                            was performed, and patient medications and                            allergies were reviewed. The patient's tolerance of                            previous anesthesia was also reviewed. The risks                            and benefits of the procedure and the sedation                            options and risks were discussed with the patient.                            All questions were answered, and informed consent                            was obtained. Prior Anticoagulants: The patient has                            taken no previous anticoagulant or antiplatelet                            agents.  ASA Grade Assessment: II - A patient with                            mild systemic disease. After reviewing the risks                            and benefits, the patient was deemed in                            satisfactory condition to undergo the procedure.                           After obtaining informed consent, the scope was                            passed under  direct vision. Throughout the                            procedure, the patient's blood pressure, pulse, and                            oxygen saturations were monitored continuously. The                            [id number] Olympus duodenoscope was                            introduced through the mouth, and used to inject                            contrast into and used to cannulate the bile duct.                            The ERCP was accomplished without difficulty. The                            patient tolerated the procedure well. Scope In: Scope Out: Findings:      The major papilla was normal. Deep selective cannulation was obtained       after multiple tries with repositioning the sphincterotome and bowing       the and there was no pancreatic duct injection and no deep pancreatic       wire advancement and on initial ileus or was seen in the distal CBD       which was hard to completely delineate based on the position of the       scope and we proceeded with a biliary sphincterotomy which was made with       a Hydratome sphincterotome using ERBE electrocautery. There was no       post-sphincterotomy bleeding. The sphincterotomy was done until we had       adequate biliary drainage and could get the fully bowed sphincterotome       easily in and out of the duct and to discover objects and better       delineate the stricture, the biliary tree was swept with  a 12 mm balloon       starting at the bifurcation. Nothing was found on balloon pull-through       except for the distal stricture which with gentle pressure and changing       scope position we were able to get the balloon to come through and no       additional filling defects were seen on occlusion cholangiogram and we       then went ahead and placed One 10 Fr by 5 cm plastic stent with a single       external flap and a single internal flap was placed 4 cm into the common       bile duct. Bile flowed through  the stent. The stent was in good       position. The CBD readily drained the scope was slowly withdrawn after       the wire and the introducer were removed and the patient tolerated the       procedure well Impression:               - The major papilla appeared normal.                           - A biliary sphincterotomy was performed.                           - The biliary tree was swept and nothing was found.                           - One plastic stent was placed into the common bile                            duct. No pancreatic duct injection or deep wire                            advancements were obtained throughout the procedure Moderate Sedation:      Not Applicable - Patient had care per Anesthesia. Recommendation:           - Clear liquid diet for 6 hours. If doing well this                            evening may retry soft solids                           - Continue present medications.                           - Return to GI clinic in 2 weeks.                           - Telephone GI clinic if symptomatic PRN.                            Particularly if increased pain fever vomiting or                            signs of GI  bleeding                           - Check liver enzymes (AST, ALT, alkaline                            phosphatase, bilirubin) in 7 days. Procedure Code(s):        --- Professional ---                           [phone], Endoscopic retrograde                            cholangiopancreatography (ERCP); with placement of                            endoscopic stent into biliary or pancreatic duct,                            including pre- and post-dilation and guide wire                            passage, when performed, including sphincterotomy,                            when performed, each stent Diagnosis Code(s):        --- Professional ---                           R17, Unspecified jaundice                           K86.1, Other chronic  pancreatitis                           K83.8, Other specified diseases of biliary tract CPT copyright 2019 [employer]. All rights reserved. The codes documented in this report are preliminary and upon coder review may  be revised to meet current compliance requirements. Clarene Essex, MD 03/21/2019 12:10:37 PM This report has been signed electronically. Number of Addenda: 0

## 2019-03-21 NOTE — Anesthesia Procedure Notes (Signed)
Procedure Name: Intubation Date/Time: 03/21/2019 11:11 AM Performed by: Talbot Grumbling, CRNA Pre-anesthesia Checklist: Patient identified, Emergency Drugs available, Suction available and Patient being monitored Patient Re-evaluated:Patient Re-evaluated prior to induction Oxygen Delivery Method: Circle system utilized Preoxygenation: Pre-oxygenation with 100% oxygen Induction Type: IV induction Ventilation: Mask ventilation without difficulty Laryngoscope Size: Mac and 3 Grade View: Grade I Tube type: Oral Tube size: 7.5 mm Number of attempts: 1 Airway Equipment and Method: Stylet Placement Confirmation: ETT inserted through vocal cords under direct vision,  positive ETCO2 and breath sounds checked- equal and bilateral Secured at: 22 cm Tube secured with: Tape Dental Injury: Teeth and Oropharynx as per pre-operative assessment

## 2019-03-21 NOTE — Progress Notes (Signed)
[name] 10:58 AM  Subjective: Other than pain when he eats the patient has no new complaints and his case was discussed with my partner Dr. Michail Sermon and his office computer chart and our hospital computer chart was reviewed and he has no new complaints and we rediscussed this procedure  Objective: Vital signs stable afebrile no acute distress exam please see preassessment evaluation patient obviously jaundiced which is new CT reviewed and discussed with multiple gastroenterologist  Assessment: Pancreatitis with CBD obstruction  Plan: Okay to proceed with ERCP and CBD stent with anesthesia assistance and the procedure was rediscussed with the patient including the risks  [facility]  office [phone] After 5PM or if no answer call [phone]

## 2019-03-22 ENCOUNTER — Encounter: Payer: Self-pay | Admitting: *Deleted

## 2019-03-23 NOTE — Anesthesia Postprocedure Evaluation (Signed)
Anesthesia Post Note  Patient: MANJINDER CARAWAY  Procedure(s) Performed: ENDOSCOPIC RETROGRADE CHOLANGIOPANCREATOGRAPHY (ERCP) with stent placement (N/A ) SPHINCTEROTOMY BILIARY STENT PLACEMENT (N/A )     Patient location during evaluation: PACU Anesthesia Type: General Level of consciousness: awake and alert Pain management: pain level controlled Vital Signs Assessment: post-procedure vital signs reviewed and stable Respiratory status: spontaneous breathing, nonlabored ventilation, respiratory function stable and patient connected to nasal cannula oxygen Cardiovascular status: blood pressure returned to baseline and stable Postop Assessment: no apparent nausea or vomiting Anesthetic complications: no    Last Vitals:  Vitals:   03/21/19 1230 03/21/19 1240  BP: 112/61 116/65  Pulse: 73 74  Resp: 20 (!) 22  Temp:    SpO2: 93% 93%    Last Pain:  Vitals:   03/22/19 1220  TempSrc:   PainSc: 0-No pain                 Michon Kaczmarek S

## 2019-04-04 ENCOUNTER — Other Ambulatory Visit: Payer: Self-pay | Admitting: Gastroenterology

## 2019-04-04 DIAGNOSIS — K863 Pseudocyst of pancreas: Secondary | ICD-10-CM

## 2019-04-04 DIAGNOSIS — K859 Acute pancreatitis without necrosis or infection, unspecified: Secondary | ICD-10-CM

## 2019-04-16 ENCOUNTER — Inpatient Hospital Stay (HOSPITAL_COMMUNITY)
Admission: EM | Admit: 2019-04-16 | Discharge: 2019-04-18 | DRG: 439 | Disposition: A | Discharge status: Home / Self Care | Payer: Managed Care, Other (non HMO) | Attending: Physician Assistant | Admitting: Physician Assistant

## 2019-04-16 ENCOUNTER — Emergency Department (HOSPITAL_COMMUNITY): Payer: Managed Care, Other (non HMO)

## 2019-04-16 DIAGNOSIS — R188 Other ascites: Secondary | ICD-10-CM | POA: Diagnosis present

## 2019-04-16 DIAGNOSIS — K859 Acute pancreatitis without necrosis or infection, unspecified: Principal | ICD-10-CM | POA: Diagnosis present

## 2019-04-16 DIAGNOSIS — Z87891 Personal history of nicotine dependence: Secondary | ICD-10-CM

## 2019-04-16 DIAGNOSIS — Z981 Arthrodesis status: Secondary | ICD-10-CM

## 2019-04-16 DIAGNOSIS — I959 Hypotension, unspecified: Secondary | ICD-10-CM | POA: Diagnosis present

## 2019-04-16 DIAGNOSIS — Z85828 Personal history of other malignant neoplasm of skin: Secondary | ICD-10-CM

## 2019-04-16 DIAGNOSIS — K8681 Exocrine pancreatic insufficiency: Secondary | ICD-10-CM | POA: Diagnosis present

## 2019-04-16 DIAGNOSIS — Z20822 Contact with and (suspected) exposure to covid-19: Secondary | ICD-10-CM | POA: Diagnosis present

## 2019-04-16 DIAGNOSIS — T8143XA Infection following a procedure, organ and space surgical site, initial encounter: Secondary | ICD-10-CM

## 2019-04-16 DIAGNOSIS — F411 Generalized anxiety disorder: Secondary | ICD-10-CM | POA: Diagnosis present

## 2019-04-16 DIAGNOSIS — Z882 Allergy status to sulfonamides status: Secondary | ICD-10-CM

## 2019-04-16 DIAGNOSIS — Z79899 Other long term (current) drug therapy: Secondary | ICD-10-CM

## 2019-04-16 DIAGNOSIS — K529 Noninfective gastroenteritis and colitis, unspecified: Secondary | ICD-10-CM | POA: Diagnosis not present

## 2019-04-16 DIAGNOSIS — R1084 Generalized abdominal pain: Secondary | ICD-10-CM

## 2019-04-16 DIAGNOSIS — F1021 Alcohol dependence, in remission: Secondary | ICD-10-CM | POA: Diagnosis present

## 2019-04-16 DIAGNOSIS — K861 Other chronic pancreatitis: Secondary | ICD-10-CM | POA: Diagnosis present

## 2019-04-16 DIAGNOSIS — E785 Hyperlipidemia, unspecified: Secondary | ICD-10-CM | POA: Diagnosis present

## 2019-04-16 DIAGNOSIS — Z9049 Acquired absence of other specified parts of digestive tract: Secondary | ICD-10-CM

## 2019-04-16 DIAGNOSIS — Z7982 Long term (current) use of aspirin: Secondary | ICD-10-CM

## 2019-04-16 DIAGNOSIS — Z888 Allergy status to other drugs, medicaments and biological substances status: Secondary | ICD-10-CM

## 2019-04-16 LAB — URINALYSIS, ROUTINE W REFLEX MICROSCOPIC
Bilirubin Urine: NEGATIVE
Glucose, UA: NEGATIVE mg/dL
Hgb urine dipstick: NEGATIVE
Ketones, ur: NEGATIVE mg/dL
Leukocytes,Ua: NEGATIVE
Nitrite: NEGATIVE
Protein, ur: NEGATIVE mg/dL
Specific Gravity, Urine: 1.008 (ref 1.005–1.030)
pH: 5 (ref 5.0–8.0)

## 2019-04-16 LAB — CBC WITH DIFFERENTIAL/PLATELET
Abs Immature Granulocytes: 0.03 10*3/uL (ref 0.00–0.07)
Basophils Absolute: 0 10*3/uL (ref 0.0–0.1)
Basophils Relative: 0 %
Eosinophils Absolute: 0.1 10*3/uL (ref 0.0–0.5)
Eosinophils Relative: 1 %
HCT: 30.9 % — ABNORMAL LOW (ref 39.0–52.0)
Hemoglobin: 9.5 g/dL — ABNORMAL LOW (ref 13.0–17.0)
Immature Granulocytes: 0 %
Lymphocytes Relative: 12 %
Lymphs Abs: 1.1 10*3/uL (ref 0.7–4.0)
MCH: 29.1 pg (ref 26.0–34.0)
MCHC: 30.7 g/dL (ref 30.0–36.0)
MCV: 94.5 fL (ref 80.0–100.0)
Monocytes Absolute: 0.6 10*3/uL (ref 0.1–1.0)
Monocytes Relative: 6 %
Neutro Abs: 7.5 10*3/uL (ref 1.7–7.7)
Neutrophils Relative %: 81 %
Platelets: 258 10*3/uL (ref 150–400)
RBC: 3.27 MIL/uL — ABNORMAL LOW (ref 4.22–5.81)
RDW: 16 % — ABNORMAL HIGH (ref 11.5–15.5)
WBC: 9.3 10*3/uL (ref 4.0–10.5)
nRBC: 0 % (ref 0.0–0.2)

## 2019-04-16 LAB — COMPREHENSIVE METABOLIC PANEL
ALT: 67 U/L — ABNORMAL HIGH (ref 0–44)
AST: 43 U/L — ABNORMAL HIGH (ref 15–41)
Albumin: 2.8 g/dL — ABNORMAL LOW (ref 3.5–5.0)
Alkaline Phosphatase: 537 U/L — ABNORMAL HIGH (ref 38–126)
Anion gap: 11 (ref 5–15)
BUN: 13 mg/dL (ref 6–20)
CO2: 20 mmol/L — ABNORMAL LOW (ref 22–32)
Calcium: 8.6 mg/dL — ABNORMAL LOW (ref 8.9–10.3)
Chloride: 104 mmol/L (ref 98–111)
Creatinine, Ser: 0.83 mg/dL (ref 0.61–1.24)
GFR calc Af Amer: >60 mL/min (ref >60)
GFR calc non Af Amer: >60 mL/min (ref >60)
Glucose, Bld: 120 mg/dL — ABNORMAL HIGH (ref 70–99)
Potassium: 3.6 mmol/L (ref 3.5–5.1)
Sodium: 135 mmol/L (ref 135–145)
Total Bilirubin: 1.3 mg/dL — ABNORMAL HIGH (ref 0.3–1.2)
Total Protein: 6.7 g/dL (ref 6.5–8.1)

## 2019-04-16 LAB — LIPASE, BLOOD: Lipase: 146 U/L — ABNORMAL HIGH (ref 11–51)

## 2019-04-16 MED ORDER — PIPERACILLIN-TAZOBACTAM 3.375 G IVPB
3.3750 g | Freq: Three times a day (TID) | INTRAVENOUS | Status: DC
  Administered 2019-04-17 – 2019-04-18 (×4): 3.375 g via INTRAVENOUS
  Filled 2019-04-16 (×4): qty 50

## 2019-04-16 MED ORDER — SODIUM CHLORIDE 0.9 % IV SOLN: INTRAVENOUS | Status: DC

## 2019-04-16 MED ORDER — HYDROMORPHONE HCL 1 MG/ML IJ SOLN
1.0000 mg | Freq: Once | INTRAMUSCULAR | Status: AC
  Administered 2019-04-16: 1 mg via INTRAVENOUS
  Filled 2019-04-16: qty 1

## 2019-04-16 MED ORDER — METOPROLOL TARTRATE 5 MG/5ML IV SOLN: 5.0000 mg | Freq: Four times a day (QID) | INTRAVENOUS | Status: DC | PRN

## 2019-04-16 MED ORDER — ENOXAPARIN SODIUM 40 MG/0.4ML ~~LOC~~ SOLN
40.0000 mg | Freq: Every day | SUBCUTANEOUS | Status: DC
  Administered 2019-04-17 (×2): 40 mg via SUBCUTANEOUS
  Filled 2019-04-16 (×2): qty 0.4

## 2019-04-16 MED ORDER — ONDANSETRON 4 MG PO TBDP: 4.0000 mg | ORAL_TABLET | Freq: Four times a day (QID) | ORAL | Status: DC | PRN

## 2019-04-16 MED ORDER — HYDROMORPHONE HCL 1 MG/ML IJ SOLN
0.5000 mg | INTRAMUSCULAR | Status: DC | PRN
  Administered 2019-04-16 – 2019-04-18 (×9): 0.5 mg via INTRAVENOUS
  Filled 2019-04-16 (×8): qty 1

## 2019-04-16 MED ORDER — PIPERACILLIN-TAZOBACTAM 4.5 G IVPB
4.5000 g | Freq: Once | INTRAVENOUS | Status: AC
  Administered 2019-04-16: 4.5 g via INTRAVENOUS
  Filled 2019-04-16: qty 100

## 2019-04-16 MED ORDER — ONDANSETRON HCL 4 MG/2ML IJ SOLN: 4.0000 mg | Freq: Four times a day (QID) | INTRAMUSCULAR | Status: DC | PRN

## 2019-04-16 MED ORDER — HYDRALAZINE HCL 20 MG/ML IJ SOLN: 10.0000 mg | INTRAMUSCULAR | Status: DC | PRN

## 2019-04-16 MED ORDER — DIPHENHYDRAMINE HCL 50 MG/ML IJ SOLN: 25.0000 mg | Freq: Four times a day (QID) | INTRAMUSCULAR | Status: DC | PRN

## 2019-04-16 MED ORDER — PANTOPRAZOLE SODIUM 40 MG IV SOLR
40.0000 mg | Freq: Every day | INTRAVENOUS | Status: DC
  Administered 2019-04-17 (×2): 40 mg via INTRAVENOUS
  Filled 2019-04-16 (×2): qty 40

## 2019-04-16 MED ORDER — IOHEXOL 300 MG/ML  SOLN
100.0000 mL | Freq: Once | INTRAMUSCULAR | Status: AC | PRN
  Administered 2019-04-16: 100 mL via INTRAVENOUS

## 2019-04-16 MED ORDER — SODIUM CHLORIDE 0.9 % IV SOLN: Freq: Once | INTRAVENOUS | Status: AC

## 2019-04-16 MED ORDER — HYDROMORPHONE HCL 1 MG/ML IJ SOLN: 1.0000 mg | INTRAMUSCULAR | Status: DC | PRN

## 2019-04-16 MED ORDER — DIPHENHYDRAMINE HCL 25 MG PO CAPS: 25.0000 mg | ORAL_CAPSULE | Freq: Four times a day (QID) | ORAL | Status: DC | PRN

## 2019-04-16 MED ORDER — SODIUM CHLORIDE 0.9 % IV BOLUS (SEPSIS)
1000.0000 mL | Freq: Once | INTRAVENOUS | Status: AC
  Administered 2019-04-16: 1000 mL via INTRAVENOUS

## 2019-04-16 NOTE — H&P (Addendum)
Surgical H&P Requesting provider: Dr. Langston Masker  Chief Complaint: abdominal pain  HPI: This is a very pleasant 59 year old man who developed acute onset left-sided lower and mid abdominal pain around 3:30 in the afternoon today.  The quality is sharp and stabbing, and it has been persistent.  Radiates somewhat towards the umbilicus.  Denies any nausea and vomiting and in fact reports that he has been eating quite well recently.  States that his bowel movements have been regular.  His last colonoscopy was 10 years ago.  His gastroenterologists are Dr. Michail Sermon and Dr. Watt Climes.  He has a recent history of severe acute on chronic pancreatitis, and was admitted for 8 days with pancreatitis at the end of December.  He underwent ERCP with CBD stent placement 2 weeks ago with Dr. Watt Climes, no injection or instrumentation of the pancreatic duct.   He underwent CT scan this afternoon as part of his work-up which showed a couple intra-abdominal fluid collections.  The final read is not available.  Initial verbal impression was that perhaps colitis was the source.  His last CT scan was January 11.  At that time he had fluid collections in the pancreatic tail, as well as within the small bowel mesentery and transverse mesocolon, in the central anterior small bowel mesentery, consistent with acute pancreatic fluid collections.  It does not appear any of this was sampled.  He also has multifocal pancreatic ductal dilatation with an abrupt narrowing at the pancreatic head, calcifications throughout the pancreatic parenchyma.     Allergies  Allergen Reactions  . Sulfa Antibiotics Swelling, Rash and Other (See Comments)    Face swells and this class of meds caused welts, also  . Zocor [Simvastatin] Other (See Comments)    Elevated his liver enzymes    Past Medical History:  Diagnosis Date  . Abnormal EKG    anterolateral T wave inversion  . Anxiety   . Arthritis    "lower back, maybe some in my neck" (08/05/2017)   . 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 Chronic lower back pain    spondylolisthesis,spondylosis,DDD,stenosis  . Depression   . Dizziness   . Hyperlipidemia    was on meds but has been off x 5 months d/t elevated liver enzymes  . Hyperlipidemia    statin intolerant because of elevated liver function tests  . Iliac artery aneurysm, bilateral ([facility])    atable at 2.2 cm 07/28/17  . Insomnia    takes Trazodone nightly  . Numbness and tingling of both legs   . Opiate addiction ([place])   . Pancreatitis 08/03/2017  . PONV (postoperative nausea and vomiting)   . Skin cancer    "forehead; right posterior neck"  . Weakness    pain and occasionally tingling in right arm and leg    Past Surgical History:  Procedure Laterality Date  . ANTERIOR CERVICAL DECOMP/DISCECTOMY FUSION  2003, 2007   x 2  . ANTERIOR CERVICAL DECOMP/DISCECTOMY FUSION N/A 10/13/2017   Procedure: Cervical Four-Five Cervical Five-Six Anterior cervical decompression/discectomy/fusion with exploration of Cervical Three-Four, Cervical Six-Seven fusions/possible removal of hardware;  Surgeon: Erline Levine, MD;  Location: [facility];  Service: Neurosurgery;  Laterality: N/A;  Cervical Four-Five Cervical Five-Six Anterior cervical decompression/discectomy/fusion with exploration of Cervical  . ANTERIOR LAT LUMBAR FUSION Right 09/29/2018   Procedure: Right Lumbar three-lumbar four Anterolateral lumbar interbody fusion with lateral plate;  Surgeon: Erline Levine, MD;  Location: [facility];  Service: Neurosurgery;  Laterality: Right;  . APPENDECTOMY  as a child  . BACK SURGERY    . BILIARY STENT PLACEMENT N/A 03/21/2019   Procedure: BILIARY STENT PLACEMENT;  Surgeon: Clarene Essex, MD;  Location: [facility];  Service: Endoscopy;  Laterality: N/A;  . COLONOSCOPY    . ERCP N/A 03/21/2019   Procedure: ENDOSCOPIC RETROGRADE CHOLANGIOPANCREATOGRAPHY (ERCP) with stent placement;  Surgeon: Clarene Essex, MD;  Location: [facility];  Service:  Endoscopy;  Laterality: N/A;  . KNEE ARTHROSCOPY Right 2011  . LUMBAR LAMINECTOMY  2014   L4-5  . MOHS SURGERY     "forehead; right posterior neck"  . POSTERIOR LUMBAR FUSION  2015  . radial nerve impingement Right 2009   "near elbor"  . SPHINCTEROTOMY  03/21/2019   Procedure: SPHINCTEROTOMY;  Surgeon: Clarene Essex, MD;  Location: [facility];  Service: Endoscopy;;  balloon sweep  . TIBIA FRACTURE SURGERY Right as a child   compound fracture    Family History  Problem Relation Age of Onset  . Arthritis Mother   . Cirrhosis Father   . Stroke Neg Hx     Social History   Socioeconomic History  . Marital status: Married    Spouse name: Not on file  . Number of children: 0  . Years of education: 64  . Highest education level: Not on file  Occupational History  . Occupation: [employer]  Tobacco Use  . Smoking status: Former Smoker    Packs/day: 0.50    Years: 2.00    Pack years: 1.00    Types: Cigarettes  . Smokeless tobacco: Former Systems developer    Types: Snuff    Quit date: 09/14/2017  . Tobacco comment: smoked while he was in high school;   Substance and Sexual Activity  . Alcohol use: Not Currently    Alcohol/week: 84.0 standard drinks    Types: 84 Glasses of wine per week    Comment: 08/05/2017 "2 bottles of red wine/night", 09/26/2018 not currently drinking alcohol  . Drug use: Not Currently  . Sexual activity: Yes  Other Topics Concern  . Not on file  Social History Narrative   ** Merged History Encounter **   Lives at home w/ his wife   Right-handed   Caffeine: 3 cups coffee per day   Social Determinants of Health   Financial Resource Strain:   . Difficulty of Paying Living Expenses: Not on file  Food Insecurity:   . Worried About Charity fundraiser in the Last Year: Not on file  . Ran Out of Food in the Last Year: Not on file  Transportation Needs:   . Lack of Transportation (Medical): Not on file  . Lack of Transportation (Non-Medical): Not  on file  Physical Activity:   . Days of Exercise per Week: Not on file  . Minutes of Exercise per Session: Not on file  Stress:   . Feeling of Stress : Not on file  Social Connections:   . Frequency of Communication with Friends and Family: Not on file  . Frequency of Social Gatherings with Friends and Family: Not on file  . Attends Religious Services: Not on file  . Active Member of Clubs or Organizations: Not on file  . Attends Archivist Meetings: Not on file  . Marital Status: Not on file    No current facility-administered medications on file prior to encounter.   Current Outpatient Medications on File Prior to Encounter  Medication Sig Dispense Refill  . aspirin (BAYER LOW DOSE) 81 MG EC  tablet Take 81 mg by mouth daily. Swallow whole.    . Cholecalciferol (VITAMIN D3) 50 MCG (2000 UT) TABS Take 2,000 Units by mouth daily.    . Cyanocobalamin (B-12) 5000 MCG SUBL Place 5,000 mcg under the tongue daily.    Marland Kitchen docusate sodium (COLACE) 100 MG capsule Take 100 mg by mouth daily as needed for mild constipation.    . fluticasone (FLONASE) 50 MCG/ACT nasal spray Place 1-2 sprays into both nostrils daily as needed for allergies or rhinitis.    Marland Kitchen ibuprofen (ADVIL) 200 MG tablet Take 600 mg by mouth 3 (three) times daily.    . lipase/protease/amylase (CREON) 36000 UNITS CPEP capsule Take 1-2 capsules (36,000-72,000 Units total) by mouth 4 (four) times daily. (Patient taking differently: Take 108,000 Units by mouth 3 (three) times daily. ) 180 capsule 4  . Multiple Vitamins-Minerals (CENTRUM SILVER 50+MEN) TABS Take 1 tablet by mouth daily with breakfast.    . naphazoline-pheniramine (NAPHCON-A) 0.025-0.3 % ophthalmic solution Place 1 drop into both eyes 4 (four) times daily as needed (for seasonal allergies).     . ondansetron (ZOFRAN) 4 MG tablet Take 1 tablet (4 mg total) by mouth every 6 (six) hours as needed for nausea. 20 tablet 0  . polyethylene glycol (MIRALAX / GLYCOLAX) 17  g packet Take 17 g by mouth daily. (Patient taking differently: Take 17 g by mouth daily as needed for mild constipation. ) 14 each 0  . pregabalin (LYRICA) 75 MG capsule Take 75 mg by mouth See admin instructions. Take 75 mg by mouth at bedtime and an additional 75 mg once a day as needed for pain    . rosuvastatin (CRESTOR) 40 MG tablet Take 20 mg by mouth daily.    . sertraline (ZOLOFT) 50 MG tablet Take 75 mg by mouth 2 (two) times daily.     . sildenafil (VIAGRA) 100 MG tablet Take 50-100 mg by mouth daily as needed for erectile dysfunction.     . traZODone (DESYREL) 150 MG tablet Take 150 mg by mouth at bedtime.    . Multiple Vitamin (MULTIVITAMIN WITH MINERALS) TABS tablet Take 1 tablet by mouth daily. (Patient not taking: Reported on 04/16/2019)      Review of Systems: a complete, 10pt review of systems was completed with pertinent positives and negatives as documented in the HPI  Physical Exam: Vitals:   04/16/19 1731 04/16/19 2003  BP: 123/85 122/79  Pulse: 83 72  Resp:  18  Temp: 98.1 F (36.7 C)   SpO2: 98% 100%   Gen: A&Ox3, no distress  Eyes: lids and conjunctivae normal, no icterus. Pupils equally round and reactive to light.  Respiratory: supple without mass or thyromegaly Chest: respiratory effort is normal. No crepitus or tenderness on palpation of the chest. Breath sounds equal.  Cardiovascular: RRR with palpable distal pulses, no pedal edema Gastrointestinal: Abdomen is not distended, firm, diffusely tender with more focal tenderness just above the umbilicus and in the left midabdomen.  No mass, hepatomegaly or splenomegaly. No hernia. Lymphatic: no lymphadenopathy in the neck or groin Muscoloskeletal: no clubbing or cyanosis of the fingers.  Strength is symmetrical throughout.  Range of motion of bilateral upper and lower extremities normal with pain, crepitation or contracture. Neuro: cranial nerves grossly intact.  Sensation intact to light touch diffusely. Psych:  appropriate mood and affect, normal insight/judgment intact  Skin: warm and dry   CBC Latest Ref Rng & Units 04/16/2019 03/08/2019 03/06/2019  WBC 4.0 - 10.5 K/uL 9.3  5.2 10.5  Hemoglobin 13.0 - 17.0 g/dL 9.5(L) 10.6(L) 12.4(L)  Hematocrit 39.0 - 52.0 % 30.9(L) 32.4(L) 37.6(L)  Platelets 150 - 400 K/uL 258 234 230    CMP Latest Ref Rng & Units 04/16/2019 03/08/2019 03/07/2019  Glucose 70 - 99 mg/dL 120(H) 124(H) -  BUN 6 - 20 mg/dL 13 8 -  Creatinine 0.61 - 1.24 mg/dL 0.83 0.97 -  Sodium 135 - 145 mmol/L 135 139 -  Potassium 3.5 - 5.1 mmol/L 3.6 3.1(L) -  Chloride 98 - 111 mmol/L 104 103 -  CO2 22 - 32 mmol/L 20(L) 29 -  Calcium 8.9 - 10.3 mg/dL 8.6(L) 8.3(L) -  Total Protein 6.5 - 8.1 g/dL 6.7 5.2(L) 5.5(L)  Total Bilirubin 0.3 - 1.2 mg/dL 1.3(H) 6.3(H) 5.6(H)  Alkaline Phos 38 - 126 U/L 537(H) 471(H) 297(H)  AST 15 - 41 U/L 43(H) 106(H) 58(H)  ALT 0 - 44 U/L 67(H) 64(H) 47(H)    Lab Results  Component Value Date   INR 1.2 03/01/2019    Imaging: No results found.   A/P: 59 year old gentleman with abdominal pain in the setting of recent pancreatitis.  By my review of his CT scan compared to the one he had last month, it seems that these fluid collections are actually decreasing in size.  The final read of the CT scan is pending.  His lipase is mildly elevated and perhaps more likely his abdominal pain reflects recurrent pancreatitis although the location would be unusual.  Given the degree of abdominal pain and concerning abdominal exam, he warrants admission for observation and serial exams.  Will initiate empiric Zosyn.  Recheck labs in the morning.  Fluid resuscitation.  Symptom directed supportive care.  ADMISSION STATUS: observation  Patient Active Problem List   Diagnosis Date Noted  . Colitis 04/16/2019  . Chronic pancreatitis ([place]) 03/01/2019  . Alcohol dependence in remission ([place]) 03/01/2019  . Acute on chronic pancreatitis ([place]) 03/01/2019  . Cervical stenosis of  spinal canal 10/13/2017  . Acute pancreatitis 08/04/2017  . Elevated LFTs 08/04/2017  . Carotid artery dissection ([place]) 08/04/2017  . Chronic neck pain 08/04/2017  . Acute alcoholic pancreatitis [id number]  . Hay fever 07/18/2015  . Anxiety state 07/18/2015  . Fatty infiltration of liver 07/18/2015  . Insomnia 07/18/2015  . Anxiety and depression 04/02/2015  . Spondylolisthesis of lumbar region 09/10/2013  . Hyperlipidemia 09/05/2013  . Opiate dependence ([place]) 08/20/2013       Romana Juniper, MD [facility], [place]  See AMION to contact appropriate on-call provider

## 2019-04-16 NOTE — ED Triage Notes (Signed)
Patient came in via EMS; c/o abdominal pain x 2 hrs. Reported recently had a stent placed 03/21/2019 d/t pancreatitis.

## 2019-04-16 NOTE — ED Provider Notes (Signed)
[facility] EMERGENCY DEPARTMENT Provider Note    CSN: [id number] Arrival date & time: 04/16/19  1729     History Chief Complaint  Patient presents with  . Abdominal Pain    Leonard [name] is a 59 y.o. male with history of pancreatitis presenting to the emergency department with abrupt onset left lower quadrant abdominal pain.  Reports onset of symptoms around 3 PM while at work today.  He describes stabbing pain in his left lower abdomen.  It has been persistent.  It does not radiate anywhere, although he does feel like he may be having pain towards his umbilicus.  He denies any nausea or vomiting.  Reports had a regular bowel movement earlier today.  He denies any fevers or chills.  He reports a past surgical history of appendectomy.  He had a stent placed on January 13 for recurrent alcoholic pancreatitis via ERCP with Dr Watt Climes at [facility].  HPI     Past Medical History:  Diagnosis Date  . Abnormal EKG    anterolateral T wave inversion  . Anxiety   . Arthritis    "lower back, maybe some in my neck" (08/05/2017)  . 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 Chronic lower back pain    spondylolisthesis,spondylosis,DDD,stenosis  . Depression   . Dizziness   . Hyperlipidemia    was on meds but has been off x 5 months d/t elevated liver enzymes  . Hyperlipidemia    statin intolerant because of elevated liver function tests  . Iliac artery aneurysm, bilateral (HCC)    atable at 2.2 cm 07/28/17  . Insomnia    takes Trazodone nightly  . Numbness and tingling of both legs   . Opiate addiction ([place])   . Pancreatitis 08/03/2017  . PONV (postoperative nausea and vomiting)   . Skin cancer    "forehead; right posterior neck"  . Weakness    pain and occasionally tingling in right arm and leg    Patient Active Problem List   Diagnosis Date Noted  . Colitis 04/16/2019  . Chronic pancreatitis ([place]) 03/01/2019  . Alcohol dependence in remission ([place])  03/01/2019  . Acute on chronic pancreatitis ([place]) 03/01/2019  . Cervical stenosis of spinal canal 10/13/2017  . Acute pancreatitis 08/04/2017  . Elevated LFTs 08/04/2017  . Carotid artery dissection ([place]) 08/04/2017  . Chronic neck pain 08/04/2017  . Acute alcoholic pancreatitis [id number]  . Hay fever 07/18/2015  . Anxiety state 07/18/2015  . Fatty infiltration of liver 07/18/2015  . Insomnia 07/18/2015  . Anxiety and depression 04/02/2015  . Spondylolisthesis of lumbar region 09/10/2013  . Hyperlipidemia 09/05/2013  . Opiate dependence ([place]) 08/20/2013    Past Surgical History:  Procedure Laterality Date  . ANTERIOR CERVICAL DECOMP/DISCECTOMY FUSION  2003, 2007   x 2  . ANTERIOR CERVICAL DECOMP/DISCECTOMY FUSION N/A 10/13/2017   Procedure: Cervical Four-Five Cervical Five-Six Anterior cervical decompression/discectomy/fusion with exploration of Cervical Three-Four, Cervical Six-Seven fusions/possible removal of hardware;  Surgeon: Erline Levine, MD;  Location: [facility];  Service: Neurosurgery;  Laterality: N/A;  Cervical Four-Five Cervical Five-Six Anterior cervical decompression/discectomy/fusion with exploration of Cervical  . ANTERIOR LAT LUMBAR FUSION Right 09/29/2018   Procedure: Right Lumbar three-lumbar four Anterolateral lumbar interbody fusion with lateral plate;  Surgeon: Erline Levine, MD;  Location: [facility];  Service: Neurosurgery;  Laterality: Right;  . APPENDECTOMY  as a child  . BACK SURGERY    . BILIARY STENT PLACEMENT N/A 03/21/2019   Procedure:  BILIARY STENT PLACEMENT;  Surgeon: Clarene Essex, MD;  Location: [facility];  Service: Endoscopy;  Laterality: N/A;  . COLONOSCOPY    . ERCP N/A 03/21/2019   Procedure: ENDOSCOPIC RETROGRADE CHOLANGIOPANCREATOGRAPHY (ERCP) with stent placement;  Surgeon: Clarene Essex, MD;  Location: [facility];  Service: Endoscopy;  Laterality: N/A;  . KNEE ARTHROSCOPY Right 2011  . LUMBAR LAMINECTOMY  2014   L4-5  . MOHS SURGERY     "forehead;  right posterior neck"  . POSTERIOR LUMBAR FUSION  2015  . radial nerve impingement Right 2009   "near elbor"  . SPHINCTEROTOMY  03/21/2019   Procedure: SPHINCTEROTOMY;  Surgeon: Clarene Essex, MD;  Location: [facility];  Service: Endoscopy;;  balloon sweep  . TIBIA FRACTURE SURGERY Right as a child   compound fracture       Family History  Problem Relation Age of Onset  . Arthritis Mother   . Cirrhosis Father   . Stroke Neg Hx     Social History   Tobacco Use  . Smoking status: Former Smoker    Packs/day: 0.50    Years: 2.00    Pack years: 1.00    Types: Cigarettes  . Smokeless tobacco: Former Systems developer    Types: Snuff    Quit date: 09/14/2017  . Tobacco comment: smoked while he was in high school;   Substance Use Topics  . Alcohol use: Not Currently    Alcohol/week: 84.0 standard drinks    Types: 84 Glasses of wine per week    Comment: 08/05/2017 "2 bottles of red wine/night", 09/26/2018 not currently drinking alcohol  . Drug use: Not Currently    Home Medications Prior to Admission medications   Medication Sig Start Date End Date Taking? Authorizing Provider  aspirin (BAYER LOW DOSE) 81 MG EC tablet Take 81 mg by mouth daily. Swallow whole.   Yes [provider]  Cholecalciferol (VITAMIN D3) 50 MCG (2000 UT) TABS Take 2,000 Units by mouth daily.   Yes [provider]  Cyanocobalamin (B-12) 5000 MCG SUBL Place 5,000 mcg under the tongue daily.   Yes [provider]  docusate sodium (COLACE) 100 MG capsule Take 100 mg by mouth daily as needed for mild constipation.   Yes [provider]  fluticasone (FLONASE) 50 MCG/ACT nasal spray Place 1-2 sprays into both nostrils daily as needed for allergies or rhinitis.   Yes [provider]  ibuprofen (ADVIL) 200 MG tablet Take 600 mg by mouth 3 (three) times daily.   Yes [provider]  lipase/protease/amylase (CREON) 36000 UNITS CPEP capsule Take 1-2 capsules (36,000-72,000 Units  total) by mouth 4 (four) times daily. Patient taking differently: Take 108,000 Units by mouth 3 (three) times daily.  03/08/19  Yes Georgette Shell, MD  Multiple Vitamins-Minerals (CENTRUM SILVER 50+MEN) TABS Take 1 tablet by mouth daily with breakfast.   Yes [provider]  naphazoline-pheniramine (NAPHCON-A) 0.025-0.3 % ophthalmic solution Place 1 drop into both eyes 4 (four) times daily as needed (for seasonal allergies).    Yes [provider]  ondansetron (ZOFRAN) 4 MG tablet Take 1 tablet (4 mg total) by mouth every 6 (six) hours as needed for nausea. 03/08/19  Yes Georgette Shell, MD  polyethylene glycol (MIRALAX / GLYCOLAX) 17 g packet Take 17 g by mouth daily. Patient taking differently: Take 17 g by mouth daily as needed for mild constipation.  03/09/19  Yes Georgette Shell, MD  pregabalin (LYRICA) 75 MG capsule Take 75 mg by mouth  See admin instructions. Take 75 mg by mouth at bedtime and an additional 75 mg once a day as needed for pain   Yes [provider]  rosuvastatin (CRESTOR) 40 MG tablet Take 20 mg by mouth daily.   Yes [provider]  sertraline (ZOLOFT) 50 MG tablet Take 75 mg by mouth 2 (two) times daily.    Yes [provider]  sildenafil (VIAGRA) 100 MG tablet Take 50-100 mg by mouth daily as needed for erectile dysfunction.  04/07/18  Yes [provider]  traZODone (DESYREL) 150 MG tablet Take 150 mg by mouth at bedtime.   Yes [provider]  Multiple Vitamin (MULTIVITAMIN WITH MINERALS) TABS tablet Take 1 tablet by mouth daily. Patient not taking: Reported on 04/16/2019 03/09/19   Georgette Shell, MD    Allergies    Sulfa antibiotics and Zocor [simvastatin]  Review of Systems   Review of Systems  Constitutional: Negative for chills and fever.  Respiratory: Negative for cough and shortness of breath.   Cardiovascular: Negative for chest pain and palpitations.  Gastrointestinal: Positive for  abdominal pain. Negative for constipation, diarrhea, nausea and vomiting.  Genitourinary: Negative for difficulty urinating, dysuria, flank pain and hematuria.  Musculoskeletal: Negative for arthralgias and back pain.  Skin: Negative for pallor and rash.  Neurological: Negative for syncope and light-headedness.  Psychiatric/Behavioral: Negative for agitation and confusion.  All other systems reviewed and are negative.   Physical Exam Updated Vital Signs BP 118/77 (BP Location: Left Arm)   Pulse 61   Temp (!) 97.3 F (36.3 C) (Oral)   Resp 18   Ht 5\' 10"  (1.778 m)   Wt 74.8 kg   SpO2 100%   BMI 23.68 kg/m   Physical Exam Vitals and nursing note reviewed.  Constitutional:      General: He is in acute distress.     Appearance: He is well-developed.  HENT:     Head: Normocephalic and atraumatic.  Eyes:     Conjunctiva/sclera: Conjunctivae normal.  Cardiovascular:     Rate and Rhythm: Normal rate and regular rhythm.     Heart sounds: No murmur.  Pulmonary:     Effort: Pulmonary effort is normal. No respiratory distress.     Breath sounds: Normal breath sounds.  Abdominal:     Palpations: Abdomen is soft.     Tenderness: There is abdominal tenderness in the epigastric area and left lower quadrant. There is guarding and rebound. Positive signs include Murphy's sign. Negative signs include McBurney's sign.  Musculoskeletal:     Cervical back: Neck supple.  Skin:    General: Skin is warm and dry.  Neurological:     Mental Status: He is alert.     ED Results / Procedures / Treatments   Labs (all labs ordered are listed, but only abnormal results are displayed) Labs Reviewed  CBC WITH DIFFERENTIAL/PLATELET - Abnormal; Notable for the following components:      Result Value   RBC 3.27 (*)    Hemoglobin 9.5 (*)    HCT 30.9 (*)    RDW 16.0 (*)    All other components within normal limits  COMPREHENSIVE METABOLIC PANEL - Abnormal; Notable for the following components:    CO2 20 (*)    Glucose, Bld 120 (*)    Calcium 8.6 (*)    Albumin 2.8 (*)    AST 43 (*)    ALT 67 (*)    Alkaline Phosphatase 537 (*)    Total  Bilirubin 1.3 (*)    All other components within normal limits  LIPASE, BLOOD - Abnormal; Notable for the following components:   Lipase 146 (*)    All other components within normal limits  COMPREHENSIVE METABOLIC PANEL - Abnormal; Notable for the following components:   Calcium 8.5 (*)    Total Protein 6.0 (*)    Albumin 2.5 (*)    ALT 55 (*)    Alkaline Phosphatase 458 (*)    Total Bilirubin 1.5 (*)    All other components within normal limits  CBC - Abnormal; Notable for the following components:   RBC 3.00 (*)    Hemoglobin 8.8 (*)    HCT 28.4 (*)    RDW 15.9 (*)    All other components within normal limits  LIPASE, BLOOD - Abnormal; Notable for the following components:   Lipase 93 (*)    All other components within normal limits  SARS CORONAVIRUS 2 (TAT 6-24 HRS)  URINALYSIS, ROUTINE W REFLEX MICROSCOPIC    EKG None  Radiology CT ABDOMEN PELVIS W CONTRAST  Result Date: 04/16/2019 CLINICAL DATA:  Left lower quadrant abdominal pain EXAM: CT ABDOMEN AND PELVIS WITH CONTRAST TECHNIQUE: Multidetector CT imaging of the abdomen and pelvis was performed using the standard protocol following bolus administration of intravenous contrast. CONTRAST:  115mL OMNIPAQUE IOHEXOL 300 is MG/ML SOLN COMPARISON:  March 19, 2019 FINDINGS: Lower chest: The visualized heart size within normal limits. No pericardial fluid/thickening. No hiatal hernia. The visualized portions of the lungs are clear. Hepatobiliary: The liver is normal in density without focal abnormality.The main portal vein is patent. Again noted is mild intrahepatic and extrahepatic biliary ductal dilatation. The common bile duct measures up to 1.2 cm. There is a new bile duct/proximal pancreatic stent seen. The gallbladder is unremarkable. Pancreas: There is interval significant  reduction in the circumscribed fluid collection within the pancreatic tail now measuring 2.7 x 1.7 cm, previously 4.3 x 3.0 cm. Again noted is dilation of the pancreatic duct throughout measuring up to 8 mm with dense coarse calcifications throughout. There is new pancreatic fat stranding changes seen around the distal pancreatic body and pancreatic tail which appears to extend into the pericolic gutter. There is also mesenteric fat stranding changes seen around stomach and spleen I a disc all the rodding 6. This is been due. There does appear to be parenchymal enhancement of the pancreas, without definite evidence of pancreatic necrosis. Spleen: Are badly other different upper Dr. Leory Plowman at upper back the of the tip of a Adrenals/Urinary Tract: Both adrenal glands appear normal. The kidneys and collecting system appear normal without evidence of urinary tract calculus or hydronephrosis. Bladder is unremarkable. Stomach/Bowel: There is gastric wall thickening seen around the mid to distal portion with fat stranding changes from the adjacent pancreas. Adjacent to mid ileal loops in the mid abdomen is a loculated fluid collection measuring 4.7 x 2.5 x 4.6 cm. This is best seen on series 3, image 49. There is mild surrounding mesenteric fat stranding changes. There is also fat stranding changes which extend into the right pericolic gutter and into the right lower pelvis where there is also non loculated fluid adjacent to distal ileal loops, series 3, image 77. Adjacent to the distal sigmoid colon there appears to be also a nother pocket of non loculated fluid, series 3, image 81 with mild surrounding fat stranding changes. Vascular/Lymphatic: There are no enlarged mesenteric, retroperitoneal, or pelvic lymph nodes. Scattered aortic atherosclerotic calcifications are seen without  aneurysmal dilatation. There is also unchanged bilateral common iliac aneurysmal dilatation with the left common iliac artery measuring up to  1.8 cm and the left common iliac artery measuring up to 1.9 cm. Reproductive: The prostate is unremarkable. Other: No evidence of abdominal wall mass or hernia. Musculoskeletal: No acute or significant osseous findings. The patient is status post decompression and posterior spinal fixation at L4-L5 with transpedicular screws and rod fixation with interbody fixation. There is also lateral fixation with interbody spacer seen at L3-L4. IMPRESSION: 1. Findings consistent with acute on chronic pancreatitis predominantly involving the distal pancreatic body and tail. There is significant surrounding mesenteric fat stranding changes that extend around the stomach, spleen, and into the pericolic gutters. 2. There has been interval significant reduction in the ill-defined fluid collection within the pancreatic tail now measuring 2.7 x 1.7 cm. No evidence of pancreatic necrosis. 3. Findings suggestive of gastritis involving the mid to distal stomach likely from the adjacent inflammatory changes. 4. New loculated fluid collection adjacent to the mid ileum, concerning for intra-abdominal abscess. 5. Several non loculated fluid collections adjacent to the distal ileum and distal sigmoid colon with adjacent inflammatory changes, concerning for phlegmon/early abscess and ileitis/colitis. 6. New common bile duct/proximal pancreatic ductal stent. 7.  Aortic Atherosclerosis (ICD10-I70.0). 8. Unchanged bilateral common iliac arterial aneurysms. 9. These results were called by telephone at the time of interpretation on 04/16/2019 at 8:23 pm to provider Arcadio Cope , who verbally acknowledged these results. Electronically Signed   By: Prudencio Pair M.D.   On: 04/16/2019 20:34    Procedures Procedures (including critical care time)  Medications Ordered in ED Medications  enoxaparin (LOVENOX) injection 40 mg (40 mg Subcutaneous Given 04/17/19 0220)  0.9 %  sodium chloride infusion ( Intravenous New Bag/Given 04/17/19 0744)    piperacillin-tazobactam (ZOSYN) IVPB 3.375 g (3.375 g Intravenous New Bag/Given 04/17/19 0450)  metoprolol tartrate (LOPRESSOR) injection 5 mg (has no administration in time range)  hydrALAZINE (APRESOLINE) injection 10 mg (has no administration in time range)  pantoprazole (PROTONIX) injection 40 mg (40 mg Intravenous Given 04/17/19 0219)  ondansetron (ZOFRAN-ODT) disintegrating tablet 4 mg (has no administration in time range)    Or  ondansetron (ZOFRAN) injection 4 mg (has no administration in time range)  diphenhydrAMINE (BENADRYL) capsule 25 mg (has no administration in time range)    Or  diphenhydrAMINE (BENADRYL) injection 25 mg (has no administration in time range)  HYDROmorphone (DILAUDID) injection 0.5 mg (0.5 mg Intravenous Given 04/17/19 1138)  sodium chloride 0.9 % bolus 1,000 mL (0 mLs Intravenous Stopped 04/16/19 1910)  HYDROmorphone (DILAUDID) injection 1 mg (1 mg Intravenous Given 04/16/19 2006)  iohexol (OMNIPAQUE) 300 MG/ML solution 100 mL (100 mLs Intravenous Contrast Given 04/16/19 2000)  piperacillin-tazobactam (ZOSYN) IVPB 4.5 g (0 g Intravenous Stopped 04/16/19 2224)  0.9 %  sodium chloride infusion ( Intravenous Stopped 04/16/19 2229)    ED Course  I have reviewed the triage vital signs and the nursing notes.  Pertinent labs & imaging results that were available during my care of the patient were reviewed by me and considered in my medical decision making (see chart for details).  59 yo male presenting to ED with acute onset of abdominal pain today.  No nausea, vomiting, or diarrhea.  He has generalized discomfort and guarding, but is most focally uncomfortable in the LLQ and epigastrum.  No abdominal distension or vomiting to suggest SBO.  DDx includes diverticulitis vs. Cholecystitis vs. Pancreatitis vs. Bowel perforation vs. Other  Vitals stable on arrival.  He does not appear toxic.  We will give IVF and pain medications, make NPO, and obtain a CT scan with IV contrast to  evaluate his abdomen.  Less likely kidney stone with no prior history of this.  Less likely pyelo with no flank ttp or fever.  PSHx of appendectomy  Clinical Course as of Apr 16 1246  Mon Apr 16, 2019  2215 Dr Watt Climes of GI reports no acute intervention planned, awaiting general surgery evaluation   [MT]    Clinical Course User Index [MT] Langston Masker Carola Rhine, MD    Final Clinical Impression(s) / ED Diagnoses Final diagnoses:  Generalized abdominal pain  Acute pancreatitis, unspecified complication status, unspecified pancreatitis type  Postoperative intra-abdominal abscess    Rx / DC Orders ED Discharge Orders    None       Wyvonnia Dusky, MD 04/17/19 1250

## 2019-04-17 DIAGNOSIS — Z87891 Personal history of nicotine dependence: Secondary | ICD-10-CM | POA: Diagnosis not present

## 2019-04-17 DIAGNOSIS — I959 Hypotension, unspecified: Secondary | ICD-10-CM | POA: Diagnosis present

## 2019-04-17 DIAGNOSIS — R188 Other ascites: Secondary | ICD-10-CM | POA: Diagnosis present

## 2019-04-17 DIAGNOSIS — E785 Hyperlipidemia, unspecified: Secondary | ICD-10-CM | POA: Diagnosis present

## 2019-04-17 DIAGNOSIS — K859 Acute pancreatitis without necrosis or infection, unspecified: Secondary | ICD-10-CM | POA: Diagnosis present

## 2019-04-17 DIAGNOSIS — Z981 Arthrodesis status: Secondary | ICD-10-CM | POA: Diagnosis not present

## 2019-04-17 DIAGNOSIS — Z888 Allergy status to other drugs, medicaments and biological substances status: Secondary | ICD-10-CM | POA: Diagnosis not present

## 2019-04-17 DIAGNOSIS — F1021 Alcohol dependence, in remission: Secondary | ICD-10-CM | POA: Diagnosis present

## 2019-04-17 DIAGNOSIS — Z7982 Long term (current) use of aspirin: Secondary | ICD-10-CM | POA: Diagnosis not present

## 2019-04-17 DIAGNOSIS — Z20822 Contact with and (suspected) exposure to covid-19: Secondary | ICD-10-CM | POA: Diagnosis present

## 2019-04-17 DIAGNOSIS — K861 Other chronic pancreatitis: Secondary | ICD-10-CM | POA: Diagnosis present

## 2019-04-17 DIAGNOSIS — Z79899 Other long term (current) drug therapy: Secondary | ICD-10-CM | POA: Diagnosis not present

## 2019-04-17 DIAGNOSIS — Z9049 Acquired absence of other specified parts of digestive tract: Secondary | ICD-10-CM | POA: Diagnosis not present

## 2019-04-17 DIAGNOSIS — K529 Noninfective gastroenteritis and colitis, unspecified: Secondary | ICD-10-CM | POA: Diagnosis present

## 2019-04-17 DIAGNOSIS — Z85828 Personal history of other malignant neoplasm of skin: Secondary | ICD-10-CM | POA: Diagnosis not present

## 2019-04-17 DIAGNOSIS — F411 Generalized anxiety disorder: Secondary | ICD-10-CM | POA: Diagnosis present

## 2019-04-17 DIAGNOSIS — K8681 Exocrine pancreatic insufficiency: Secondary | ICD-10-CM | POA: Diagnosis present

## 2019-04-17 DIAGNOSIS — Z882 Allergy status to sulfonamides status: Secondary | ICD-10-CM | POA: Diagnosis not present

## 2019-04-17 LAB — CBC
HCT: 28.4 % — ABNORMAL LOW (ref 39.0–52.0)
Hemoglobin: 8.8 g/dL — ABNORMAL LOW (ref 13.0–17.0)
MCH: 29.3 pg (ref 26.0–34.0)
MCHC: 31 g/dL (ref 30.0–36.0)
MCV: 94.7 fL (ref 80.0–100.0)
Platelets: 239 10*3/uL (ref 150–400)
RBC: 3 MIL/uL — ABNORMAL LOW (ref 4.22–5.81)
RDW: 15.9 % — ABNORMAL HIGH (ref 11.5–15.5)
WBC: 5.1 10*3/uL (ref 4.0–10.5)
nRBC: 0 % (ref 0.0–0.2)

## 2019-04-17 LAB — COMPREHENSIVE METABOLIC PANEL
ALT: 55 U/L — ABNORMAL HIGH (ref 0–44)
AST: 33 U/L (ref 15–41)
Albumin: 2.5 g/dL — ABNORMAL LOW (ref 3.5–5.0)
Alkaline Phosphatase: 458 U/L — ABNORMAL HIGH (ref 38–126)
Anion gap: 9 (ref 5–15)
BUN: 9 mg/dL (ref 6–20)
CO2: 25 mmol/L (ref 22–32)
Calcium: 8.5 mg/dL — ABNORMAL LOW (ref 8.9–10.3)
Chloride: 107 mmol/L (ref 98–111)
Creatinine, Ser: 0.87 mg/dL (ref 0.61–1.24)
GFR calc Af Amer: >60 mL/min (ref >60)
GFR calc non Af Amer: >60 mL/min (ref >60)
Glucose, Bld: 94 mg/dL (ref 70–99)
Potassium: 4.2 mmol/L (ref 3.5–5.1)
Sodium: 141 mmol/L (ref 135–145)
Total Bilirubin: 1.5 mg/dL — ABNORMAL HIGH (ref 0.3–1.2)
Total Protein: 6 g/dL — ABNORMAL LOW (ref 6.5–8.1)

## 2019-04-17 LAB — SARS CORONAVIRUS 2 (TAT 6-24 HRS): SARS Coronavirus 2: NEGATIVE

## 2019-04-17 LAB — LIPASE, BLOOD: Lipase: 93 U/L — ABNORMAL HIGH (ref 11–51)

## 2019-04-17 NOTE — Progress Notes (Signed)
Pt arrived from ED via stretcher, ambulated to bed. Respirations even and unlabored on room air. No distress noted. Pt does complain of left mid abdominal pain rated 6/10. Medicated per PRN orders. Skin warm, dry and intact assed by RN's Daria Pastures and Naval architect. Pt oriented to room and floor protocols. Callbell in reach, encouraged to call with any needs or concerns.

## 2019-04-17 NOTE — Progress Notes (Signed)
[facility] Progress Note     Subjective: CC: abdominal pain Patient reports abdominal pain is better but still present, pain medication helping. Pain mostly in LUQ and epigastrium. Denies nausea or vomiting. +flatus, last BM was yesterday. Patient hopeful to be able to go home soon.   Review of Systems  Constitutional: Negative for chills and fever.  Gastrointestinal: Positive for abdominal pain. Negative for blood in stool, constipation, diarrhea, nausea and vomiting.  Genitourinary: Negative for dysuria, frequency and urgency.  All other systems reviewed and are negative.    Objective: Vital signs in last 24 hours: Temp:  [97.3 F (36.3 C)-98.1 F (36.7 C)] 97.3 F (36.3 C) (02/09 0450) Pulse Rate:  [61-83] 61 (02/09 0450) Resp:  [18] 18 (02/09 0450) BP: (107-123)/(67-85) 118/77 (02/09 0450) SpO2:  [98 %-100 %] 100 % (02/09 0450) Weight:  [74.8 kg] 74.8 kg (02/09 0127) Last BM Date: 04/15/19  Intake/Output from previous day: 02/08 0701 - 02/09 0700 In: 1598.4 [P.O.:60; I.V.:536.7; IV Piggyback:1001.8] Out: -  Intake/Output this shift: No intake/output data recorded.  PE: General: pleasant, WD, WN white male who is laying in bed in NAD HEENT:  Sclera are noninjected.  PERRL.  Ears and nose without any masses or lesions.  Mouth is pink and moist Heart: regular, rate, and rhythm.  Normal s1,s2. No obvious murmurs, gallops, or rubs noted.  Palpable radial and pedal pulses bilaterally Lungs: CTAB, no wheezes, rhonchi, or rales noted.  Respiratory effort nonlabored Abd: soft, mild-mod ttp in LUQ and epigastrium, voluntary guarding is present, no peritonitis, ND, +BS, no masses, hernias, or organomegaly MS: all 4 extremities are symmetrical with no cyanosis, clubbing, or edema. Skin: warm and dry with no masses, lesions, or rashes Neuro: Cranial nerves 2-12 grossly intact, speech is normal Psych: A&Ox3 with an appropriate affect.   Lab Results:  Recent Labs    04/16/19 1741 04/17/19 0150  WBC 9.3 5.1  HGB 9.5* 8.8*  HCT 30.9* 28.4*  PLT 258 239   BMET Recent Labs    04/16/19 1741 04/17/19 0150  NA 135 141  K 3.6 4.2  CL 104 107  CO2 20* 25  GLUCOSE 120* 94  BUN 13 9  CREATININE 0.83 0.87  CALCIUM 8.6* 8.5*   PT/INR No results for input(s): LABPROT, INR in the last 72 hours. CMP     Component Value Date/Time   NA 141 04/17/2019 0150   NA 140 10/05/2016 0951   K 4.2 04/17/2019 0150   CL 107 04/17/2019 0150   CO2 25 04/17/2019 0150   GLUCOSE 94 04/17/2019 0150   BUN 9 04/17/2019 0150   BUN 21 10/05/2016 0951   CREATININE 0.87 04/17/2019 0150   CALCIUM 8.5 (L) 04/17/2019 0150   PROT 6.0 (L) 04/17/2019 0150   ALBUMIN 2.5 (L) 04/17/2019 0150   AST 33 04/17/2019 0150   ALT 55 (H) 04/17/2019 0150   ALKPHOS 458 (H) 04/17/2019 0150   BILITOT 1.5 (H) 04/17/2019 0150   GFRNONAA >60 04/17/2019 0150   GFRAA >60 04/17/2019 0150   Lipase     Component Value Date/Time   LIPASE 93 (H) 04/17/2019 0150       Studies/Results: CT ABDOMEN PELVIS W CONTRAST  Result Date: 04/16/2019 CLINICAL DATA:  Left lower quadrant abdominal pain EXAM: CT ABDOMEN AND PELVIS WITH CONTRAST TECHNIQUE: Multidetector CT imaging of the abdomen and pelvis was performed using the standard protocol following bolus administration of intravenous contrast. CONTRAST:  119mL OMNIPAQUE IOHEXOL 300 is MG/ML SOLN  COMPARISON:  March 19, 2019 FINDINGS: Lower chest: The visualized heart size within normal limits. No pericardial fluid/thickening. No hiatal hernia. The visualized portions of the lungs are clear. Hepatobiliary: The liver is normal in density without focal abnormality.The main portal vein is patent. Again noted is mild intrahepatic and extrahepatic biliary ductal dilatation. The common bile duct measures up to 1.2 cm. There is a new bile duct/proximal pancreatic stent seen. The gallbladder is unremarkable. Pancreas: There is interval significant reduction in  the circumscribed fluid collection within the pancreatic tail now measuring 2.7 x 1.7 cm, previously 4.3 x 3.0 cm. Again noted is dilation of the pancreatic duct throughout measuring up to 8 mm with dense coarse calcifications throughout. There is new pancreatic fat stranding changes seen around the distal pancreatic body and pancreatic tail which appears to extend into the pericolic gutter. There is also mesenteric fat stranding changes seen around stomach and spleen I a disc all the rodding 6. This is been due. There does appear to be parenchymal enhancement of the pancreas, without definite evidence of pancreatic necrosis. Spleen: Are badly other different upper Dr. Leory Plowman at upper back the of the tip of a Adrenals/Urinary Tract: Both adrenal glands appear normal. The kidneys and collecting system appear normal without evidence of urinary tract calculus or hydronephrosis. Bladder is unremarkable. Stomach/Bowel: There is gastric wall thickening seen around the mid to distal portion with fat stranding changes from the adjacent pancreas. Adjacent to mid ileal loops in the mid abdomen is a loculated fluid collection measuring 4.7 x 2.5 x 4.6 cm. This is best seen on series 3, image 49. There is mild surrounding mesenteric fat stranding changes. There is also fat stranding changes which extend into the right pericolic gutter and into the right lower pelvis where there is also non loculated fluid adjacent to distal ileal loops, series 3, image 77. Adjacent to the distal sigmoid colon there appears to be also a nother pocket of non loculated fluid, series 3, image 81 with mild surrounding fat stranding changes. Vascular/Lymphatic: There are no enlarged mesenteric, retroperitoneal, or pelvic lymph nodes. Scattered aortic atherosclerotic calcifications are seen without aneurysmal dilatation. There is also unchanged bilateral common iliac aneurysmal dilatation with the left common iliac artery measuring up to 1.8 cm and  the left common iliac artery measuring up to 1.9 cm. Reproductive: The prostate is unremarkable. Other: No evidence of abdominal wall mass or hernia. Musculoskeletal: No acute or significant osseous findings. The patient is status post decompression and posterior spinal fixation at L4-L5 with transpedicular screws and rod fixation with interbody fixation. There is also lateral fixation with interbody spacer seen at L3-L4. IMPRESSION: 1. Findings consistent with acute on chronic pancreatitis predominantly involving the distal pancreatic body and tail. There is significant surrounding mesenteric fat stranding changes that extend around the stomach, spleen, and into the pericolic gutters. 2. There has been interval significant reduction in the ill-defined fluid collection within the pancreatic tail now measuring 2.7 x 1.7 cm. No evidence of pancreatic necrosis. 3. Findings suggestive of gastritis involving the mid to distal stomach likely from the adjacent inflammatory changes. 4. New loculated fluid collection adjacent to the mid ileum, concerning for intra-abdominal abscess. 5. Several non loculated fluid collections adjacent to the distal ileum and distal sigmoid colon with adjacent inflammatory changes, concerning for phlegmon/early abscess and ileitis/colitis. 6. New common bile duct/proximal pancreatic ductal stent. 7.  Aortic Atherosclerosis (ICD10-I70.0). 8. Unchanged bilateral common iliac arterial aneurysms. 9. These results were called by telephone  at the time of interpretation on 04/16/2019 at 8:23 pm to provider [facility] , who verbally acknowledged these results. Electronically Signed   By: Prudencio Pair M.D.   On: 04/16/2019 20:34    Anti-infectives: Anti-infectives (From admission, onward)   Start     Dose/Rate Route Frequency Ordered Stop   04/17/19 0600  piperacillin-tazobactam (ZOSYN) IVPB 3.375 g     3.375 g 12.5 mL/hr over 240 Minutes Intravenous Every 8 hours 04/16/19 2319     04/16/19  2030  piperacillin-tazobactam (ZOSYN) IVPB 4.5 g     4.5 g 200 mL/hr over 30 Minutes Intravenous  Once 04/16/19 2021 04/16/19 2224       Assessment/Plan Acute on chronic pancreatitis Intraabdominal fluid collections Colitis/ileitis - WBC 5, afebrile - patient with mild-moderate generalized abdominal ttp, no peritonitis - no indications for urgent/emergent surgical intervention - stent placed 2 weeks ago by Dr. Watt Climes, will have GI see patient and leave any recommendations - hopefully ADAT and home soon on PO abx  FEN: CLD, IVF VTE: SCDs, lovenox ID: IV zosyn 2/8>> Foley: none  Follow up: GI, PCP   LOS: 0 days    Brigid Re , [facility] 04/17/2019, 9:16 AM Please see Amion for pager number during day hours 7:00am-4:30pm

## 2019-04-17 NOTE — Consult Note (Signed)
Referring Provider: Dr. Serita Grammes  Primary Care Physician:  Gaynelle Arabian, MD Primary Gastroenterologist:  Dr. Michail Sermon [facility])  Reason for Consultation: pancreatitis  HPI: Leonard [name] is a 59 y.o. male with history of alcohol-related pancreatitis with CBD obstruction, s/p ERCP with CBD stent placement on 03/21/2019 presenting with a chief complaint of abdominal pain.    The patient states that yesterday, he started having 10/10 abdominal pain that was so severe, it caused him to "double over," which led him to call 911 and present to the hospital via ambulance.    There had been a prodrome of intermittent mild migratory abdominal pain in the days leading up to yesterday's abrupt onset of pain. He reports the pain was located in the epigastric and LUQ with radiation to the back and left shoulder.  He reports improvement of his pain with pain medication and at this time he is feeling much better.    He denies denies fever, chills, nausea, and vomiting.  He denies changes in stool; he reports a normal bowel movement yesterday.    He further reports episodes of dizziness and hypotension at home with home BP readings in the 69G systolic and 29B-28U diastolic.  In the emergency room, a CT scan showed significant decrease in the size of the loculated fluid collection in the tail of the pancreas, but more advanced phlegmonous changes within the mesentery around the small bowel and stomach.  His biliary stent was still in place, as was his pancreatic stent.    Blood work in the emergency room showed an elevated lipase of 146, as compared to a level of 61 on December 28, when it was most recently checked.  Overnight, the lipase level fell to 93.  Liver chemistries were essentially normal, with bilirubin of 1.5, transaminases in the 30-60 range, alk phos 537 which was only slightly higher than it was 5 weeks earlier.  White count normal at 9300, hemoglobin slightly lower at 9.5.  No alcohol  for the past year and a half.  Today, the patient has been on a clear liquid diet, and is tolerating it without any evident provocation of pain or nausea or vomiting.       Past Medical History:  Diagnosis Date  . Abnormal EKG    anterolateral T wave inversion  . Anxiety   . Arthritis    "lower back, maybe some in my neck" (08/05/2017)  . 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 Chronic lower back pain    spondylolisthesis,spondylosis,DDD,stenosis  . Depression   . Dizziness   . Hyperlipidemia    was on meds but has been off x 5 months d/t elevated liver enzymes  . Hyperlipidemia    statin intolerant because of elevated liver function tests  . Iliac artery aneurysm, bilateral ([facility])    atable at 2.2 cm 07/28/17  . Insomnia    takes Trazodone nightly  . Numbness and tingling of both legs   . Opiate addiction ([place])   . Pancreatitis 08/03/2017  . PONV (postoperative nausea and vomiting)   . Skin cancer    "forehead; right posterior neck"  . Weakness    pain and occasionally tingling in right arm and leg    Past Surgical History:  Procedure Laterality Date  . ANTERIOR CERVICAL DECOMP/DISCECTOMY FUSION  2003, 2007   x 2  . ANTERIOR CERVICAL DECOMP/DISCECTOMY FUSION N/A 10/13/2017   Procedure: Cervical Four-Five Cervical Five-Six Anterior cervical decompression/discectomy/fusion with exploration of Cervical Three-Four,  Cervical Six-Seven fusions/possible removal of hardware;  Surgeon: Erline Levine, MD;  Location: [facility];  Service: Neurosurgery;  Laterality: N/A;  Cervical Four-Five Cervical Five-Six Anterior cervical decompression/discectomy/fusion with exploration of Cervical  . ANTERIOR LAT LUMBAR FUSION Right 09/29/2018   Procedure: Right Lumbar three-lumbar four Anterolateral lumbar interbody fusion with lateral plate;  Surgeon: Erline Levine, MD;  Location: [facility];  Service: Neurosurgery;  Laterality: Right;  . APPENDECTOMY  as a child  . BACK SURGERY    . BILIARY  STENT PLACEMENT N/A 03/21/2019   Procedure: BILIARY STENT PLACEMENT;  Surgeon: Clarene Essex, MD;  Location: [facility];  Service: Endoscopy;  Laterality: N/A;  . COLONOSCOPY    . ERCP N/A 03/21/2019   Procedure: ENDOSCOPIC RETROGRADE CHOLANGIOPANCREATOGRAPHY (ERCP) with stent placement;  Surgeon: Clarene Essex, MD;  Location: [facility];  Service: Endoscopy;  Laterality: N/A;  . KNEE ARTHROSCOPY Right 2011  . LUMBAR LAMINECTOMY  2014   L4-5  . MOHS SURGERY     "forehead; right posterior neck"  . POSTERIOR LUMBAR FUSION  2015  . radial nerve impingement Right 2009   "near elbor"  . SPHINCTEROTOMY  03/21/2019   Procedure: SPHINCTEROTOMY;  Surgeon: Clarene Essex, MD;  Location: [facility];  Service: Endoscopy;;  balloon sweep  . TIBIA FRACTURE SURGERY Right as a child   compound fracture    Prior to Admission medications   Medication Sig Start Date End Date Taking? Authorizing Provider  aspirin (BAYER LOW DOSE) 81 MG EC tablet Take 81 mg by mouth daily. Swallow whole.   Yes [provider]  Cholecalciferol (VITAMIN D3) 50 MCG (2000 UT) TABS Take 2,000 Units by mouth daily.   Yes [provider]  Cyanocobalamin (B-12) 5000 MCG SUBL Place 5,000 mcg under the tongue daily.   Yes [provider]  docusate sodium (COLACE) 100 MG capsule Take 100 mg by mouth daily as needed for mild constipation.   Yes [provider]  fluticasone (FLONASE) 50 MCG/ACT nasal spray Place 1-2 sprays into both nostrils daily as needed for allergies or rhinitis.   Yes [provider]  ibuprofen (ADVIL) 200 MG tablet Take 600 mg by mouth 3 (three) times daily.   Yes [provider]  lipase/protease/amylase (CREON) 36000 UNITS CPEP capsule Take 1-2 capsules (36,000-72,000 Units total) by mouth 4 (four) times daily. Patient taking differently: Take 108,000 Units by mouth 3 (three) times daily.  03/08/19  Yes Georgette Shell, MD  Multiple Vitamins-Minerals (CENTRUM  SILVER 50+MEN) TABS Take 1 tablet by mouth daily with breakfast.   Yes [provider]  naphazoline-pheniramine (NAPHCON-A) 0.025-0.3 % ophthalmic solution Place 1 drop into both eyes 4 (four) times daily as needed (for seasonal allergies).    Yes [provider]  ondansetron (ZOFRAN) 4 MG tablet Take 1 tablet (4 mg total) by mouth every 6 (six) hours as needed for nausea. 03/08/19  Yes Georgette Shell, MD  polyethylene glycol (MIRALAX / GLYCOLAX) 17 g packet Take 17 g by mouth daily. Patient taking differently: Take 17 g by mouth daily as needed for mild constipation.  03/09/19  Yes Georgette Shell, MD  pregabalin (LYRICA) 75 MG capsule Take 75 mg by mouth See admin instructions. Take 75 mg by mouth at bedtime and an additional 75 mg once a day as needed for pain   Yes [provider]  rosuvastatin (CRESTOR) 40 MG tablet Take 20 mg by mouth daily.   Yes [provider]  sertraline (ZOLOFT) 50 MG tablet  Take 75 mg by mouth 2 (two) times daily.    Yes [provider]  sildenafil (VIAGRA) 100 MG tablet Take 50-100 mg by mouth daily as needed for erectile dysfunction.  04/07/18  Yes [provider]  traZODone (DESYREL) 150 MG tablet Take 150 mg by mouth at bedtime.   Yes [provider]  Multiple Vitamin (MULTIVITAMIN WITH MINERALS) TABS tablet Take 1 tablet by mouth daily. Patient not taking: Reported on 04/16/2019 03/09/19   Georgette Shell, MD    Current Facility-Administered Medications  Medication Dose Route Frequency Provider Last Rate Last Admin  . 0.9 %  sodium chloride infusion   Intravenous Continuous Clovis Riley, MD 100 mL/hr at 04/17/19 0744 New Bag at 04/17/19 0744  . diphenhydrAMINE (BENADRYL) capsule 25 mg  25 mg Oral Q6H PRN Romana Juniper A, MD       Or  . diphenhydrAMINE (BENADRYL) injection 25 mg  25 mg Intravenous Q6H PRN Romana Juniper A, MD      . enoxaparin (LOVENOX) injection 40 mg  40 mg  Subcutaneous QHS Romana Juniper A, MD   40 mg at 04/17/19 0220  . hydrALAZINE (APRESOLINE) injection 10 mg  10 mg Intravenous Q2H PRN Romana Juniper A, MD      . HYDROmorphone (DILAUDID) injection 0.5 mg  0.5 mg Intravenous Q2H PRN Romana Juniper A, MD   0.5 mg at 04/17/19 1138  . metoprolol tartrate (LOPRESSOR) injection 5 mg  5 mg Intravenous Q6H PRN Romana Juniper A, MD      . ondansetron (ZOFRAN-ODT) disintegrating tablet 4 mg  4 mg Oral Q6H PRN Clovis Riley, MD       Or  . ondansetron (ZOFRAN) injection 4 mg  4 mg Intravenous Q6H PRN Romana Juniper A, MD      . pantoprazole (PROTONIX) injection 40 mg  40 mg Intravenous QHS Romana Juniper A, MD   40 mg at 04/17/19 0219  . piperacillin-tazobactam (ZOSYN) IVPB 3.375 g  3.375 g Intravenous Q8H Connor, Chelsea A, MD 12.5 mL/hr at 04/17/19 1316 3.375 g at 04/17/19 1316    Allergies as of 04/16/2019 - Review Complete 04/16/2019  Allergen Reaction Noted  . Sulfa antibiotics Swelling, Rash, and Other (See Comments) 08/10/2011  . Zocor [simvastatin] Other (See Comments) 08/04/2017    Family History  Problem Relation Age of Onset  . Arthritis Mother   . Cirrhosis Father   . Stroke Neg Hx     Social History   Socioeconomic History  . Marital status: Married    Spouse name: Not on file  . Number of children: 0  . Years of education: 31  . Highest education level: Not on file  Occupational History  . Occupation: [employer]  Tobacco Use  . Smoking status: Former Smoker    Packs/day: 0.50    Years: 2.00    Pack years: 1.00    Types: Cigarettes  . Smokeless tobacco: Former Systems developer    Types: Snuff    Quit date: 09/14/2017  . Tobacco comment: smoked while he was in high school;   Substance and Sexual Activity  . Alcohol use: Not Currently    Alcohol/week: 84.0 standard drinks    Types: 84 Glasses of wine per week    Comment: 08/05/2017 "2 bottles of red wine/night", 09/26/2018 not currently drinking alcohol   . Drug use: Not Currently  . Sexual activity: Yes  Other Topics Concern  . Not on file  Social History Narrative   **  Merged History Encounter **   Lives at home w/ his wife   Right-handed   Caffeine: 3 cups coffee per day   Social Determinants of Health   Financial Resource Strain:   . Difficulty of Paying Living Expenses: Not on file  Food Insecurity:   . Worried About Charity fundraiser in the Last Year: Not on file  . Ran Out of Food in the Last Year: Not on file  Transportation Needs:   . Lack of Transportation (Medical): Not on file  . Lack of Transportation (Non-Medical): Not on file  Physical Activity:   . Days of Exercise per Week: Not on file  . Minutes of Exercise per Session: Not on file  Stress:   . Feeling of Stress : Not on file  Social Connections:   . Frequency of Communication with Friends and Family: Not on file  . Frequency of Social Gatherings with Friends and Family: Not on file  . Attends Religious Services: Not on file  . Active Member of Clubs or Organizations: Not on file  . Attends Archivist Meetings: Not on file  . Marital Status: Not on file  Intimate Partner Violence:   . Fear of Current or Ex-Partner: Not on file  . Emotionally Abused: Not on file  . Physically Abused: Not on file  . Sexually Abused: Not on file    Review of Systems: See HPI  Physical Exam: Vital signs in last 24 hours: Temp:  [97.3 F (36.3 C)-98.1 F (36.7 C)] 97.3 F (36.3 C) (02/09 0450) Pulse Rate:  [61-83] 61 (02/09 0450) Resp:  [18] 18 (02/09 0450) BP: (107-123)/(67-85) 118/77 (02/09 0450) SpO2:  [98 %-100 %] 100 % (02/09 0450) Weight:  [74.8 kg] 74.8 kg (02/09 0127) Last BM Date: 04/15/19 General:  Alert,  Well-developed, well-nourished, pleasant and cooperative lying in bed in NAD whatsoever Head: Normocephalic and atraumatic. Eyes: Sclera clear, no icterus. Lungs: Clear throughout to auscultation.   No wheezes, crackles, or rhonchi. No  evident respiratory distress. Heart: Regular rate and rhythm; no murmurs, clicks, rubs,  or gallops. Abdomen: Soft and nondistended with moderate diffuse tenderness and voluntary guarding to palpation of epigastrium and LUQ. Bowel sounds present.  No peritoneal signs. Extremities: No edema. Neurologic: Alert and coherent;  grossly normal neurologically. Skin: Intact without jaundice or significant lesions or rashes. Psych: Alert and cooperative. Normal mood and affect.  Intake/Output from previous day: 02/08 0701 - 02/09 0700 In: 1598.4 [P.O.:60; I.V.:536.7; IV Piggyback:1001.8] Out: -  Intake/Output this shift: Total I/O In: 360 [P.O.:360] Out: -   Lab Results: Recent Labs    04/16/19 1741 04/17/19 0150  WBC 9.3 5.1  HGB 9.5* 8.8*  HCT 30.9* 28.4*  PLT 258 239   BMET Recent Labs    04/16/19 1741 04/17/19 0150  NA 135 141  K 3.6 4.2  CL 104 107  CO2 20* 25  GLUCOSE 120* 94  BUN 13 9  CREATININE 0.83 0.87  CALCIUM 8.6* 8.5*   LFT Recent Labs    04/17/19 0150  PROT 6.0*  ALBUMIN 2.5*  AST 33  ALT 55*  ALKPHOS 458*  BILITOT 1.5*   PT/INR No results for input(s): LABPROT, INR in the last 72 hours.  Studies/Results: CT ABDOMEN PELVIS W CONTRAST  Result Date: 04/16/2019 CLINICAL DATA:  Left lower quadrant abdominal pain EXAM: CT ABDOMEN AND PELVIS WITH CONTRAST TECHNIQUE: Multidetector CT imaging of the abdomen and pelvis was performed using the standard protocol following bolus administration  of intravenous contrast. CONTRAST:  150m OMNIPAQUE IOHEXOL 300 is MG/ML SOLN COMPARISON:  March 19, 2019 FINDINGS: Lower chest: The visualized heart size within normal limits. No pericardial fluid/thickening. No hiatal hernia. The visualized portions of the lungs are clear. Hepatobiliary: The liver is normal in density without focal abnormality.The main portal vein is patent. Again noted is mild intrahepatic and extrahepatic biliary ductal dilatation. The common bile duct  measures up to 1.2 cm. There is a new bile duct/proximal pancreatic stent seen. The gallbladder is unremarkable. Pancreas: There is interval significant reduction in the circumscribed fluid collection within the pancreatic tail now measuring 2.7 x 1.7 cm, previously 4.3 x 3.0 cm. Again noted is dilation of the pancreatic duct throughout measuring up to 8 mm with dense coarse calcifications throughout. There is new pancreatic fat stranding changes seen around the distal pancreatic body and pancreatic tail which appears to extend into the pericolic gutter. There is also mesenteric fat stranding changes seen around stomach and spleen I a disc all the rodding 6. This is been due. There does appear to be parenchymal enhancement of the pancreas, without definite evidence of pancreatic necrosis. Spleen: Are badly other different upper Dr. BLeory Plowmanat upper back the of the tip of a Adrenals/Urinary Tract: Both adrenal glands appear normal. The kidneys and collecting system appear normal without evidence of urinary tract calculus or hydronephrosis. Bladder is unremarkable. Stomach/Bowel: There is gastric wall thickening seen around the mid to distal portion with fat stranding changes from the adjacent pancreas. Adjacent to mid ileal loops in the mid abdomen is a loculated fluid collection measuring 4.7 x 2.5 x 4.6 cm. This is best seen on series 3, image 49. There is mild surrounding mesenteric fat stranding changes. There is also fat stranding changes which extend into the right pericolic gutter and into the right lower pelvis where there is also non loculated fluid adjacent to distal ileal loops, series 3, image 77. Adjacent to the distal sigmoid colon there appears to be also a nother pocket of non loculated fluid, series 3, image 81 with mild surrounding fat stranding changes. Vascular/Lymphatic: There are no enlarged mesenteric, retroperitoneal, or pelvic lymph nodes. Scattered aortic atherosclerotic calcifications are  seen without aneurysmal dilatation. There is also unchanged bilateral common iliac aneurysmal dilatation with the left common iliac artery measuring up to 1.8 cm and the left common iliac artery measuring up to 1.9 cm. Reproductive: The prostate is unremarkable. Other: No evidence of abdominal wall mass or hernia. Musculoskeletal: No acute or significant osseous findings. The patient is status post decompression and posterior spinal fixation at L4-L5 with transpedicular screws and rod fixation with interbody fixation. There is also lateral fixation with interbody spacer seen at L3-L4. IMPRESSION: 1. Findings consistent with acute on chronic pancreatitis predominantly involving the distal pancreatic body and tail. There is significant surrounding mesenteric fat stranding changes that extend around the stomach, spleen, and into the pericolic gutters. 2. There has been interval significant reduction in the ill-defined fluid collection within the pancreatic tail now measuring 2.7 x 1.7 cm. No evidence of pancreatic necrosis. 3. Findings suggestive of gastritis involving the mid to distal stomach likely from the adjacent inflammatory changes. 4. New loculated fluid collection adjacent to the mid ileum, concerning for intra-abdominal abscess. 5. Several non loculated fluid collections adjacent to the distal ileum and distal sigmoid colon with adjacent inflammatory changes, concerning for phlegmon/early abscess and ileitis/colitis. 6. New common bile duct/proximal pancreatic ductal stent. 7.  Aortic Atherosclerosis (ICD10-I70.0). 8. Unchanged  bilateral common iliac arterial aneurysms. 9. These results were called by telephone at the time of interpretation on 04/16/2019 at 8:23 pm to provider MATTHEW TRIFAN , who verbally acknowledged these results. Electronically Signed   By: Prudencio Pair M.D.   On: 04/16/2019 20:34    Impression: 1. Acute on chronic pancreatitis related to prior ethanol exposure 2.  Abrupt onset of  abdominal pain of unclear cause.  I wonder if whether the fluid collection in the tail of the pancreas, which is now much smaller in size, may have leaked, causing the abrupt onset of diffuse abdominal pain and an associated inflammatory reaction in the left upper quadrant. 3.  Based on normal white count, absence of fever, and basically normal liver chemistries, I do not think there is an issue with biliary stent occlusion or cholangitis.  Plan: Observation with supportive medical therapy.   He is making good urine and renal function is normal so I do not think we need to increase his IV fluids. He seems to be tolerating his clear liquid diet so I would continue it. Continue IV Zosyn for now. Further management decisions will be guided by his clinical evolution.  If he does poorly, we may have to consider further imaging studies.  Hopefully, this event will resolve spontaneously. Unfortunately, his long-term prognosis is guarded, in that he is at risk for recurrent episodes of pancreatitis and associated pain.  Fortunately, the most important intervention, alcohol cessation, has already been accomplished.    LOS: 0 days   Youlanda Mighty Kamarah Bilotta  04/17/2019, 2:11 PM   Pager [phone] If no answer or after 5 PM call [phone]

## 2019-04-18 LAB — CBC
HCT: 25.9 % — ABNORMAL LOW (ref 39.0–52.0)
Hemoglobin: 8 g/dL — ABNORMAL LOW (ref 13.0–17.0)
MCH: 29.5 pg (ref 26.0–34.0)
MCHC: 30.9 g/dL (ref 30.0–36.0)
MCV: 95.6 fL (ref 80.0–100.0)
Platelets: 208 10*3/uL (ref 150–400)
RBC: 2.71 MIL/uL — ABNORMAL LOW (ref 4.22–5.81)
RDW: 15.9 % — ABNORMAL HIGH (ref 11.5–15.5)
WBC: 4.4 10*3/uL (ref 4.0–10.5)
nRBC: 0 % (ref 0.0–0.2)

## 2019-04-18 LAB — COMPREHENSIVE METABOLIC PANEL
ALT: 41 U/L (ref 0–44)
AST: 23 U/L (ref 15–41)
Albumin: 2.4 g/dL — ABNORMAL LOW (ref 3.5–5.0)
Alkaline Phosphatase: 363 U/L — ABNORMAL HIGH (ref 38–126)
Anion gap: 8 (ref 5–15)
BUN: 6 mg/dL (ref 6–20)
CO2: 25 mmol/L (ref 22–32)
Calcium: 8.3 mg/dL — ABNORMAL LOW (ref 8.9–10.3)
Chloride: 106 mmol/L (ref 98–111)
Creatinine, Ser: 0.9 mg/dL (ref 0.61–1.24)
GFR calc Af Amer: >60 mL/min (ref >60)
GFR calc non Af Amer: >60 mL/min (ref >60)
Glucose, Bld: 89 mg/dL (ref 70–99)
Potassium: 4.1 mmol/L (ref 3.5–5.1)
Sodium: 139 mmol/L (ref 135–145)
Total Bilirubin: 1.3 mg/dL — ABNORMAL HIGH (ref 0.3–1.2)
Total Protein: 5.7 g/dL — ABNORMAL LOW (ref 6.5–8.1)

## 2019-04-18 LAB — LIPASE, BLOOD: Lipase: 70 U/L — ABNORMAL HIGH (ref 11–51)

## 2019-04-18 MED ORDER — AMOXICILLIN-POT CLAVULANATE 875-125 MG PO TABS: 1.0000 | ORAL_TABLET | Freq: Two times a day (BID) | ORAL | 0 refills | Status: AC

## 2019-04-18 MED ORDER — PANCRELIPASE (LIP-PROT-AMYL) 12000-38000 UNITS PO CPEP: 36000.0000 [IU] | ORAL_CAPSULE | Freq: Three times a day (TID) | ORAL | Status: DC

## 2019-04-18 MED ORDER — ACETAMINOPHEN 325 MG PO TABS: 650.0000 mg | ORAL_TABLET | Freq: Four times a day (QID) | ORAL | Status: DC | PRN

## 2019-04-18 MED ORDER — HYDROMORPHONE HCL 1 MG/ML IJ SOLN: 0.5000 mg | INTRAMUSCULAR | Status: DC | PRN

## 2019-04-18 MED ORDER — OXYCODONE HCL 5 MG PO TABS
5.0000 mg | ORAL_TABLET | ORAL | Status: DC | PRN
  Administered 2019-04-18: 10 mg via ORAL
  Filled 2019-04-18: qty 2

## 2019-04-18 MED ORDER — OXYCODONE HCL 5 MG PO TABS: 5.0000 mg | ORAL_TABLET | Freq: Four times a day (QID) | ORAL | 0 refills | Status: DC | PRN

## 2019-04-18 NOTE — Discharge Instructions (Signed)
Chronic Pancreatitis    Chronic pancreatitis is long-lasting inflammation and scarring of the pancreas. The pancreas is a gland that is located behind the stomach. It makes enzymes that help to digest food. The pancreas also releases hormones called glucagon and insulin, which help regulate blood sugar (glucose). Damage to the pancreas may affect digestion, cause pain in the upper abdomen and back, and cause diabetes. Inflammation can also irritate other organs in the abdomen near the pancreas.  At first, pancreatitis may be sudden (acute). If you have several or prolonged episodes of acute pancreatitis, the condition can turn into chronic pancreatitis.  What are the causes?  The most common cause of this condition is alcohol abuse. Other causes include:  · High (elevated) levels of triglycerides in the blood (hypertriglyceridemia).  · Gallstones or other conditions that can block the tube that drains the pancreas (pancreatic duct).  · Pancreatic cancer.  · Cystic fibrosis.  · Too much calcium in the blood (hypercalcemia), which may be caused by an overactive parathyroid gland (hyperparathyroidism).  · Certain medicines.  · Injury to the pancreas.  · Infection.  · Autoimmune pancreatitis. This is when the body's disease-fighting (immune) system attacks the pancreas.  · Genes that are passed from parent to child (inherited).  In some cases, the cause may not be known.  What increases the risk?  This condition is more likely to develop in:  · Men.  · People who are 35-55 years old.  · People who have a family history of pancreatitis.  · People who smoke tobacco.  · People who drink large amounts of alcohol over a long period of time.  What are the signs or symptoms?  Symptoms of this condition may include:  · Pain in the abdomen or upper back. Pain may get worse after eating.  · Nausea and vomiting.  · Fever.  · Weight loss.  · A change in the color and consistency of bowel movements, such as stools that are oily,  fatty, or clay-colored.  How is this diagnosed?  This condition is diagnosed based on your symptoms, your medical history, and a physical exam. You may have tests, such as:  · Blood tests.  · Stool samples.  · Biopsy of the pancreas. This is the removal of a small amount of pancreas tissue to be tested in a lab.  · Imaging tests, such as:  ? X-rays.  ? CT scan.  ? MRI.  ? Ultrasound.  How is this treated?  You may need to be treated at a hospital. Treatment may involve:  · Resting the pancreas. You may need to stop eating and drinking for a few days to give your pancreas time to recover. During this time, you will be given IV fluids to keep you hydrated.  · Controlling pain. You may be given pain medicines by mouth (orally) or as injections.  · Improving digestion. You may be given:  ? Medicines to replace your pancreatic enzymes.  ? Vitamin supplements.  ? A specific diet to follow. You may work with a diet and nutrition specialist (dietitian) to make an eating plan.  · Surgery to:  ? Clear the pancreatic ducts of any blockages, such as gallstones.  ? Remove any fluid or damaged tissue from the pancreas.  Other treatments may include:  · Preventing diabetes. Your health care provider may recommend that you:  ? Get regular screening tests for diabetes.  ? Monitor your blood glucose regularly.  · Lifestyle changes, such   by your health care provider or dietitian, if this applies. This may include: ? Limiting how much fat you eat. ? Eating smaller meals more often. ? Avoiding caffeine.  Drink enough fluid to keep your urine pale yellow. General instructions  Take over-the-counter and  prescription medicines only as told by your health care provider. These include vitamin supplements.  Do not drive or use heavy machinery while taking prescription pain medicine.  If you are taking prescription pain medicine, take actions to prevent or treat constipation. Your health care provider may recommend that you: ? Take an over-the-counter or prescription medicine for constipation. ? Eat foods that are high in fiber such as whole grains and beans. ? Limit foods that are high in fat and processed sugars, such as fried or sweet foods.  Do not use any products that contain nicotine or tobacco, such as cigarettes and e-cigarettes. If you need help quitting, ask your health care provider.  If recommended by your health care provider, monitor your blood glucose at home.  Keep all follow-up visits as told by your health care provider. This is important. Contact a health care provider if:  You have pain that does not get better with medicine.  You have a fever.  You have sudden weight loss. Get help right away if:  Your pain suddenly gets worse.  You have sudden swelling in your abdomen.  You start to vomit often.  You vomit blood.  You have diarrhea that does not go away.  You have blood in your stool.  You become confused or you have trouble thinking clearly. Summary  Chronic pancreatitis is long-lasting inflammation and scarring of the pancreas. Damage to the pancreas may affect digestion, cause pain in the upper abdomen and back, and cause diabetes. Inflammation can also irritate other organs in the abdomen near the pancreas.  Common causes of this condition are alcohol abuse, gallstones, high (elevated) levels of triglycerides, and certain medicines.  This condition is sometimes treated at a hospital and may involve resting the pancreas, controlling pain, replacing enzymes, and avoiding alcohol. This information is not intended to replace advice given to you by your  health care provider. Make sure you discuss any questions you have with your health care provider. Document Revised: 09/28/2018 Document Reviewed: 10/22/2016 Elsevier Patient Education  2020 [employer] Inc.  

## 2019-04-18 NOTE — Progress Notes (Signed)
Patient discharged to home with instructions. 

## 2019-04-18 NOTE — Discharge Summary (Signed)
[facility] Discharge Summary   Patient ID: Leonard [name] MRN: [id number] DOB/AGE: [birth year] 59 y.o.  Admit date: 04/16/2019 Discharge date: 04/18/2019  Admitting Diagnosis: Chronic pancreatitis Intra-abdominal fluid collections  Discharge Diagnosis Chronic pancreatitis   Consultants Gastroenterology  Imaging: CT ABDOMEN PELVIS W CONTRAST  Result Date: 04/16/2019 CLINICAL DATA:  Left lower quadrant abdominal pain EXAM: CT ABDOMEN AND PELVIS WITH CONTRAST TECHNIQUE: Multidetector CT imaging of the abdomen and pelvis was performed using the standard protocol following bolus administration of intravenous contrast. CONTRAST:  17mL OMNIPAQUE IOHEXOL 300 is MG/ML SOLN COMPARISON:  March 19, 2019 FINDINGS: Lower chest: The visualized heart size within normal limits. No pericardial fluid/thickening. No hiatal hernia. The visualized portions of the lungs are clear. Hepatobiliary: The liver is normal in density without focal abnormality.The main portal vein is patent. Again noted is mild intrahepatic and extrahepatic biliary ductal dilatation. The common bile duct measures up to 1.2 cm. There is a new bile duct/proximal pancreatic stent seen. The gallbladder is unremarkable. Pancreas: There is interval significant reduction in the circumscribed fluid collection within the pancreatic tail now measuring 2.7 x 1.7 cm, previously 4.3 x 3.0 cm. Again noted is dilation of the pancreatic duct throughout measuring up to 8 mm with dense coarse calcifications throughout. There is new pancreatic fat stranding changes seen around the distal pancreatic body and pancreatic tail which appears to extend into the pericolic gutter. There is also mesenteric fat stranding changes seen around stomach and spleen I a disc all the rodding 6. This is been due. There does appear to be parenchymal enhancement of the pancreas, without definite evidence of pancreatic necrosis. Spleen: Are badly other different upper  Dr. Leory Plowman at upper back the of the tip of a Adrenals/Urinary Tract: Both adrenal glands appear normal. The kidneys and collecting system appear normal without evidence of urinary tract calculus or hydronephrosis. Bladder is unremarkable. Stomach/Bowel: There is gastric wall thickening seen around the mid to distal portion with fat stranding changes from the adjacent pancreas. Adjacent to mid ileal loops in the mid abdomen is a loculated fluid collection measuring 4.7 x 2.5 x 4.6 cm. This is best seen on series 3, image 49. There is mild surrounding mesenteric fat stranding changes. There is also fat stranding changes which extend into the right pericolic gutter and into the right lower pelvis where there is also non loculated fluid adjacent to distal ileal loops, series 3, image 77. Adjacent to the distal sigmoid colon there appears to be also a nother pocket of non loculated fluid, series 3, image 81 with mild surrounding fat stranding changes. Vascular/Lymphatic: There are no enlarged mesenteric, retroperitoneal, or pelvic lymph nodes. Scattered aortic atherosclerotic calcifications are seen without aneurysmal dilatation. There is also unchanged bilateral common iliac aneurysmal dilatation with the left common iliac artery measuring up to 1.8 cm and the left common iliac artery measuring up to 1.9 cm. Reproductive: The prostate is unremarkable. Other: No evidence of abdominal wall mass or hernia. Musculoskeletal: No acute or significant osseous findings. The patient is status post decompression and posterior spinal fixation at L4-L5 with transpedicular screws and rod fixation with interbody fixation. There is also lateral fixation with interbody spacer seen at L3-L4. IMPRESSION: 1. Findings consistent with acute on chronic pancreatitis predominantly involving the distal pancreatic body and tail. There is significant surrounding mesenteric fat stranding changes that extend around the stomach, spleen, and into the  pericolic gutters. 2. There has been interval significant reduction in the ill-defined fluid collection  within the pancreatic tail now measuring 2.7 x 1.7 cm. No evidence of pancreatic necrosis. 3. Findings suggestive of gastritis involving the mid to distal stomach likely from the adjacent inflammatory changes. 4. New loculated fluid collection adjacent to the mid ileum, concerning for intra-abdominal abscess. 5. Several non loculated fluid collections adjacent to the distal ileum and distal sigmoid colon with adjacent inflammatory changes, concerning for phlegmon/early abscess and ileitis/colitis. 6. New common bile duct/proximal pancreatic ductal stent. 7.  Aortic Atherosclerosis (ICD10-I70.0). 8. Unchanged bilateral common iliac arterial aneurysms. 9. These results were called by telephone at the time of interpretation on 04/16/2019 at 8:23 pm to provider MATTHEW TRIFAN , who verbally acknowledged these results. Electronically Signed   By: Prudencio Pair M.D.   On: 04/16/2019 20:34    Procedures None  Hospital Course:  Patient is a 59 year old male who presented to [facility] with abdominal pain and Hx of chronic pancreatitis s/p ERCP and stent placement by Dr. Watt Climes 2 weeks ago.  Workup showed chronic pancreatitis with intra-abdominal fluid collections.  Patient was admitted to observation for follow up labs and pain control.  Diet was advanced as tolerated. GI consulted for any further recommendations.  On 04/18/19, the patient was voiding well, tolerating diet, ambulating well, pain well controlled, vital signs stable, incisions c/d/i and felt stable for discharge home.  Patient will follow up with GI as planned.   Physical Exam: General: pleasant, WD, WN white male who is laying in bed in NAD HEENT:  Sclera are noninjected.  PERRL.  Ears and nose without any masses or lesions.  Mouth is pink and moist Heart: regular, rate, and rhythm.  Normal s1,s2. No obvious murmurs, gallops, or rubs noted.  Palpable  radial and pedal pulses bilaterally Lungs: CTAB, no wheezes, rhonchi, or rales noted.  Respiratory effort nonlabored Abd: soft, mild-mod ttp in LUQ and epigastrium, no peritonitis, ND, +BS, no masses, hernias, or organomegaly MS: all 4 extremities are symmetrical with no cyanosis, clubbing, or edema. Skin: warm and dry with no masses, lesions, or rashes Neuro: Cranial nerves 2-12 grossly intact, speech is normal Psych: A&Ox3 with an appropriate affect.  I have personally looked this patient up in the [place] Controlled Substance Database and reviewed their medications.   Allergies as of 04/18/2019      Reactions   Sulfa Antibiotics Swelling, Rash, Other (See Comments)   Face swells and this class of meds caused welts, also   Zocor [simvastatin] Other (See Comments)   Elevated his liver enzymes      Medication List    TAKE these medications   acetaminophen 325 MG tablet Commonly known as: TYLENOL Take 2 tablets (650 mg total) by mouth every 6 (six) hours as needed for mild pain.   Advil 200 MG tablet Generic drug: ibuprofen Take 600 mg by mouth 3 (three) times daily.   amoxicillin-clavulanate 875-125 MG tablet Commonly known as: Augmentin Take 1 tablet by mouth every 12 (twelve) hours for 5 days.   B-12 5000 MCG Subl Place 5,000 mcg under the tongue daily.   Bayer Low Dose 81 MG EC tablet Generic drug: aspirin Take 81 mg by mouth daily. Swallow whole.   Centrum Silver 50+Men Tabs Take 1 tablet by mouth daily with breakfast.   docusate sodium 100 MG capsule Commonly known as: COLACE Take 100 mg by mouth daily as needed for mild constipation.   fluticasone 50 MCG/ACT nasal spray Commonly known as: FLONASE Place 1-2 sprays into both nostrils daily as  needed for allergies or rhinitis.   lipase/protease/amylase 36000 UNITS Cpep capsule Commonly known as: CREON Take 1-2 capsules (36,000-72,000 Units total) by mouth 4 (four) times daily. What changed:   how much to  take  when to take this   multivitamin with minerals Tabs tablet Take 1 tablet by mouth daily.   Naphcon-A 0.025-0.3 % ophthalmic solution Generic drug: naphazoline-pheniramine Place 1 drop into both eyes 4 (four) times daily as needed (for seasonal allergies).   ondansetron 4 MG tablet Commonly known as: ZOFRAN Take 1 tablet (4 mg total) by mouth every 6 (six) hours as needed for nausea.   oxyCODONE 5 MG immediate release tablet Commonly known as: Oxy IR/ROXICODONE Take 1-2 tablets (5-10 mg total) by mouth every 6 (six) hours as needed for moderate pain or severe pain.   polyethylene glycol 17 g packet Commonly known as: MIRALAX / GLYCOLAX Take 17 g by mouth daily. What changed:   when to take this  reasons to take this   pregabalin 75 MG capsule Commonly known as: LYRICA Take 75 mg by mouth See admin instructions. Take 75 mg by mouth at bedtime and an additional 75 mg once a day as needed for pain   rosuvastatin 40 MG tablet Commonly known as: CRESTOR Take 20 mg by mouth daily.   sertraline 50 MG tablet Commonly known as: ZOLOFT Take 75 mg by mouth 2 (two) times daily.   sildenafil 100 MG tablet Commonly known as: VIAGRA Take 50-100 mg by mouth daily as needed for erectile dysfunction.   traZODone 150 MG tablet Commonly known as: DESYREL Take 150 mg by mouth at bedtime.   Vitamin D3 50 MCG (2000 UT) Tabs Take 2,000 Units by mouth daily.        Follow-up Information    Clarene Essex, MD Follow up.   Specialty: Gastroenterology Why: Follow up as planned Contact information: [address] [place] [address] [phone]        Gaynelle Arabian, MD. Call.   Specialty: Family Medicine Why: Call as needed Contact information: [address] [place], [place] [address] [phone]           Signed: Brigid Re, [facility] 04/18/2019, 3:53 PM Please see Amion for pager number during day hours  7:00am-4:30pm

## 2019-04-18 NOTE — Plan of Care (Signed)

## 2019-04-24 ENCOUNTER — Inpatient Hospital Stay
Admission: RE | Admit: 2019-04-24 | Discharge: 2019-04-24 | Disposition: A | Discharge status: Home / Self Care | Payer: Managed Care, Other (non HMO) | Source: Ambulatory Visit | Attending: Gastroenterology | Admitting: Gastroenterology

## 2019-05-15 ENCOUNTER — Ambulatory Visit
Admission: RE | Admit: 2019-05-15 | Discharge: 2019-05-15 | Disposition: A | Discharge status: Home / Self Care | Payer: Managed Care, Other (non HMO) | Source: Ambulatory Visit | Attending: Gastroenterology | Admitting: Gastroenterology

## 2019-05-15 ENCOUNTER — Other Ambulatory Visit: Payer: Self-pay | Admitting: Gastroenterology

## 2019-05-15 ENCOUNTER — Other Ambulatory Visit: Payer: Self-pay

## 2019-05-15 DIAGNOSIS — K859 Acute pancreatitis without necrosis or infection, unspecified: Secondary | ICD-10-CM

## 2019-05-15 DIAGNOSIS — K863 Pseudocyst of pancreas: Secondary | ICD-10-CM

## 2019-05-15 MED ORDER — IOPAMIDOL (ISOVUE-300) INJECTION 61%
100.0000 mL | Freq: Once | INTRAVENOUS | Status: AC | PRN
  Administered 2019-05-15: 100 mL via INTRAVENOUS

## 2019-05-24 ENCOUNTER — Ambulatory Visit: Payer: Managed Care, Other (non HMO) | Attending: Internal Medicine

## 2019-05-24 DIAGNOSIS — Z23 Encounter for immunization: Secondary | ICD-10-CM

## 2019-05-24 NOTE — Progress Notes (Signed)
   Covid-19 Vaccination Clinic  Name:  Leonard [name]    MRN: [id number] DOB: 09/19/1960  05/24/2019  Mr. [name] was observed post Covid-19 immunization for 30 minutes based on pre-vaccination screening without incident. He was provided with Vaccine Information Sheet and instruction to access the V-Safe system.   Mr. [name] was instructed to call 911 with any severe reactions post vaccine: Marland Kitchen Difficulty breathing  . Swelling of face and throat  . A fast heartbeat  . A bad rash all over body  . Dizziness and weakness   Immunizations Administered    Name Date Dose VIS Date Route   Pfizer COVID-19 Vaccine 05/24/2019 10:19 AM 0.3 mL 02/16/2019 Intramuscular   Manufacturer: [place]   Lot: [id number]   [place]: [id number]

## 2019-06-14 ENCOUNTER — Other Ambulatory Visit: Payer: Self-pay | Admitting: Gastroenterology

## 2019-06-14 DIAGNOSIS — K8689 Other specified diseases of pancreas: Secondary | ICD-10-CM

## 2019-06-18 ENCOUNTER — Ambulatory Visit: Payer: Managed Care, Other (non HMO) | Attending: Internal Medicine

## 2019-06-18 DIAGNOSIS — Z23 Encounter for immunization: Secondary | ICD-10-CM

## 2019-06-18 NOTE — Progress Notes (Signed)
   Covid-19 Vaccination Clinic  Name:  Leonard [name]    MRN: [id number] DOB: September 07, 1960  06/18/2019  [name] was observed post Covid-19 immunization for 15 minutes without incident. He was provided with Vaccine Information Sheet and instruction to access the V-Safe system.   [name] was instructed to call 911 with any severe reactions post vaccine: Marland Kitchen Difficulty breathing  . Swelling of face and throat  . A fast heartbeat  . A bad rash all over body  . Dizziness and weakness   Immunizations Administered    Name Date Dose VIS Date Route   Pfizer COVID-19 Vaccine 06/18/2019  9:33 AM 0.3 mL 02/16/2019 Intramuscular   Manufacturer: [place]   Lot: [id number]   [place]: [id number]

## 2019-07-06 ENCOUNTER — Other Ambulatory Visit: Payer: Self-pay | Admitting: Gastroenterology

## 2019-07-10 ENCOUNTER — Other Ambulatory Visit: Payer: Self-pay

## 2019-07-10 ENCOUNTER — Ambulatory Visit
Admission: RE | Admit: 2019-07-10 | Discharge: 2019-07-10 | Disposition: A | Discharge status: Home / Self Care | Payer: Managed Care, Other (non HMO) | Source: Ambulatory Visit | Attending: Gastroenterology | Admitting: Gastroenterology

## 2019-07-10 DIAGNOSIS — K8689 Other specified diseases of pancreas: Secondary | ICD-10-CM

## 2019-07-10 MED ORDER — GADOBENATE DIMEGLUMINE 529 MG/ML IV SOLN
15.0000 mL | Freq: Once | INTRAVENOUS | Status: AC | PRN
  Administered 2019-07-10: 15 mL via INTRAVENOUS

## 2019-10-18 ENCOUNTER — Other Ambulatory Visit: Payer: Self-pay | Admitting: Gastroenterology

## 2019-10-18 DIAGNOSIS — K861 Other chronic pancreatitis: Secondary | ICD-10-CM

## 2019-10-22 ENCOUNTER — Other Ambulatory Visit: Payer: Self-pay

## 2019-10-22 ENCOUNTER — Ambulatory Visit
Admission: RE | Admit: 2019-10-22 | Discharge: 2019-10-22 | Disposition: A | Discharge status: Home / Self Care | Payer: Managed Care, Other (non HMO) | Source: Ambulatory Visit | Attending: Gastroenterology | Admitting: Gastroenterology

## 2019-10-22 DIAGNOSIS — K861 Other chronic pancreatitis: Secondary | ICD-10-CM

## 2019-10-22 MED ORDER — GADOBENATE DIMEGLUMINE 529 MG/ML IV SOLN
15.0000 mL | Freq: Once | INTRAVENOUS | Status: AC | PRN
  Administered 2019-10-22: 15 mL via INTRAVENOUS

## 2019-11-01 ENCOUNTER — Other Ambulatory Visit: Payer: Managed Care, Other (non HMO)

## 2019-11-10 ENCOUNTER — Other Ambulatory Visit: Payer: Managed Care, Other (non HMO)

## 2020-03-17 ENCOUNTER — Other Ambulatory Visit: Payer: Managed Care, Other (non HMO)

## 2020-03-20 ENCOUNTER — Encounter (HOSPITAL_BASED_OUTPATIENT_CLINIC_OR_DEPARTMENT_OTHER): Payer: Self-pay | Admitting: *Deleted

## 2020-03-20 ENCOUNTER — Inpatient Hospital Stay (HOSPITAL_BASED_OUTPATIENT_CLINIC_OR_DEPARTMENT_OTHER)
Admission: EM | Admit: 2020-03-20 | Discharge: 2020-03-24 | DRG: 871 | Disposition: A | Discharge status: Home / Self Care | Payer: Managed Care, Other (non HMO) | Attending: Internal Medicine | Admitting: Internal Medicine

## 2020-03-20 ENCOUNTER — Emergency Department (HOSPITAL_BASED_OUTPATIENT_CLINIC_OR_DEPARTMENT_OTHER): Payer: Managed Care, Other (non HMO)

## 2020-03-20 ENCOUNTER — Other Ambulatory Visit: Payer: Self-pay

## 2020-03-20 DIAGNOSIS — M4310 Spondylolisthesis, site unspecified: Secondary | ICD-10-CM | POA: Diagnosis present

## 2020-03-20 DIAGNOSIS — M545 Low back pain, unspecified: Secondary | ICD-10-CM | POA: Diagnosis present

## 2020-03-20 DIAGNOSIS — E876 Hypokalemia: Secondary | ICD-10-CM | POA: Diagnosis present

## 2020-03-20 DIAGNOSIS — G4700 Insomnia, unspecified: Secondary | ICD-10-CM | POA: Diagnosis present

## 2020-03-20 DIAGNOSIS — Z888 Allergy status to other drugs, medicaments and biological substances status: Secondary | ICD-10-CM

## 2020-03-20 DIAGNOSIS — Z981 Arthrodesis status: Secondary | ICD-10-CM

## 2020-03-20 DIAGNOSIS — R0602 Shortness of breath: Secondary | ICD-10-CM | POA: Diagnosis not present

## 2020-03-20 DIAGNOSIS — M479 Spondylosis, unspecified: Secondary | ICD-10-CM | POA: Diagnosis present

## 2020-03-20 DIAGNOSIS — J9601 Acute respiratory failure with hypoxia: Secondary | ICD-10-CM | POA: Diagnosis present

## 2020-03-20 DIAGNOSIS — R531 Weakness: Secondary | ICD-10-CM | POA: Diagnosis present

## 2020-03-20 DIAGNOSIS — A419 Sepsis, unspecified organism: Secondary | ICD-10-CM | POA: Diagnosis not present

## 2020-03-20 DIAGNOSIS — R0600 Dyspnea, unspecified: Secondary | ICD-10-CM

## 2020-03-20 DIAGNOSIS — J189 Pneumonia, unspecified organism: Secondary | ICD-10-CM | POA: Diagnosis present

## 2020-03-20 DIAGNOSIS — Z882 Allergy status to sulfonamides status: Secondary | ICD-10-CM

## 2020-03-20 DIAGNOSIS — F419 Anxiety disorder, unspecified: Secondary | ICD-10-CM | POA: Diagnosis present

## 2020-03-20 DIAGNOSIS — Z85828 Personal history of other malignant neoplasm of skin: Secondary | ICD-10-CM

## 2020-03-20 DIAGNOSIS — E785 Hyperlipidemia, unspecified: Secondary | ICD-10-CM | POA: Diagnosis present

## 2020-03-20 DIAGNOSIS — Z7982 Long term (current) use of aspirin: Secondary | ICD-10-CM

## 2020-03-20 DIAGNOSIS — R509 Fever, unspecified: Secondary | ICD-10-CM

## 2020-03-20 DIAGNOSIS — Z87891 Personal history of nicotine dependence: Secondary | ICD-10-CM

## 2020-03-20 DIAGNOSIS — R197 Diarrhea, unspecified: Secondary | ICD-10-CM

## 2020-03-20 DIAGNOSIS — F1021 Alcohol dependence, in remission: Secondary | ICD-10-CM | POA: Diagnosis present

## 2020-03-20 DIAGNOSIS — Z20822 Contact with and (suspected) exposure to covid-19: Secondary | ICD-10-CM | POA: Diagnosis present

## 2020-03-20 DIAGNOSIS — Z9049 Acquired absence of other specified parts of digestive tract: Secondary | ICD-10-CM

## 2020-03-20 DIAGNOSIS — K861 Other chronic pancreatitis: Secondary | ICD-10-CM | POA: Diagnosis present

## 2020-03-20 DIAGNOSIS — D696 Thrombocytopenia, unspecified: Secondary | ICD-10-CM | POA: Diagnosis present

## 2020-03-20 DIAGNOSIS — G8929 Other chronic pain: Secondary | ICD-10-CM | POA: Diagnosis present

## 2020-03-20 DIAGNOSIS — Z8261 Family history of arthritis: Secondary | ICD-10-CM

## 2020-03-20 DIAGNOSIS — N179 Acute kidney failure, unspecified: Secondary | ICD-10-CM | POA: Diagnosis present

## 2020-03-20 DIAGNOSIS — F1120 Opioid dependence, uncomplicated: Secondary | ICD-10-CM | POA: Diagnosis present

## 2020-03-20 DIAGNOSIS — F32A Depression, unspecified: Secondary | ICD-10-CM | POA: Diagnosis present

## 2020-03-20 DIAGNOSIS — Z79899 Other long term (current) drug therapy: Secondary | ICD-10-CM

## 2020-03-20 LAB — CBC WITH DIFFERENTIAL/PLATELET
Abs Immature Granulocytes: 0.14 10*3/uL — ABNORMAL HIGH (ref 0.00–0.07)
Basophils Absolute: 0.1 10*3/uL (ref 0.0–0.1)
Basophils Relative: 1 %
Eosinophils Absolute: 0 10*3/uL (ref 0.0–0.5)
Eosinophils Relative: 0 %
HCT: 41.7 % (ref 39.0–52.0)
Hemoglobin: 13.9 g/dL (ref 13.0–17.0)
Immature Granulocytes: 1 %
Lymphocytes Relative: 6 %
Lymphs Abs: 0.6 10*3/uL — ABNORMAL LOW (ref 0.7–4.0)
MCH: 30.3 pg (ref 26.0–34.0)
MCHC: 33.3 g/dL (ref 30.0–36.0)
MCV: 91 fL (ref 80.0–100.0)
Monocytes Absolute: 0.6 10*3/uL (ref 0.1–1.0)
Monocytes Relative: 6 %
Neutro Abs: 8.3 10*3/uL — ABNORMAL HIGH (ref 1.7–7.7)
Neutrophils Relative %: 86 %
Platelets: 146 10*3/uL — ABNORMAL LOW (ref 150–400)
RBC: 4.58 MIL/uL (ref 4.22–5.81)
RDW: 14.1 % (ref 11.5–15.5)
Smear Review: NORMAL
WBC: 9.4 10*3/uL (ref 4.0–10.5)
nRBC: 0 % (ref 0.0–0.2)

## 2020-03-20 LAB — URINALYSIS, ROUTINE W REFLEX MICROSCOPIC
Bilirubin Urine: NEGATIVE
Glucose, UA: NEGATIVE mg/dL
Hgb urine dipstick: NEGATIVE
Ketones, ur: NEGATIVE mg/dL
Leukocytes,Ua: NEGATIVE
Nitrite: NEGATIVE
Protein, ur: 30 mg/dL — AB
Specific Gravity, Urine: 1.01 (ref 1.005–1.030)
pH: 6 (ref 5.0–8.0)

## 2020-03-20 LAB — COMPREHENSIVE METABOLIC PANEL
ALT: 45 U/L — ABNORMAL HIGH (ref 0–44)
AST: 37 U/L (ref 15–41)
Albumin: 2.9 g/dL — ABNORMAL LOW (ref 3.5–5.0)
Alkaline Phosphatase: 143 U/L — ABNORMAL HIGH (ref 38–126)
Anion gap: 13 (ref 5–15)
BUN: 33 mg/dL — ABNORMAL HIGH (ref 6–20)
CO2: 24 mmol/L (ref 22–32)
Calcium: 9.1 mg/dL (ref 8.9–10.3)
Chloride: 99 mmol/L (ref 98–111)
Creatinine, Ser: 1.45 mg/dL — ABNORMAL HIGH (ref 0.61–1.24)
GFR, Estimated: 55 mL/min — ABNORMAL LOW (ref >60)
Glucose, Bld: 143 mg/dL — ABNORMAL HIGH (ref 70–99)
Potassium: 3.4 mmol/L — ABNORMAL LOW (ref 3.5–5.1)
Sodium: 136 mmol/L (ref 135–145)
Total Bilirubin: 0.6 mg/dL (ref 0.3–1.2)
Total Protein: 7.3 g/dL (ref 6.5–8.1)

## 2020-03-20 LAB — RESP PANEL BY RT-PCR (FLU A&B, COVID) ARPGX2
Influenza A by PCR: NEGATIVE
Influenza B by PCR: NEGATIVE
SARS Coronavirus 2 by RT PCR: NEGATIVE

## 2020-03-20 LAB — URINALYSIS, MICROSCOPIC (REFLEX)

## 2020-03-20 LAB — TROPONIN I (HIGH SENSITIVITY)
Troponin I (High Sensitivity): 9 ng/L (ref <18)
Troponin I (High Sensitivity): 10 ng/L (ref <18)

## 2020-03-20 LAB — LACTIC ACID, PLASMA: Lactic Acid, Venous: 1.1 mmol/L (ref 0.5–1.9)

## 2020-03-20 LAB — BRAIN NATRIURETIC PEPTIDE: B Natriuretic Peptide: 265 pg/mL — ABNORMAL HIGH (ref 0.0–100.0)

## 2020-03-20 LAB — LIPASE, BLOOD: Lipase: 33 U/L (ref 11–51)

## 2020-03-20 MED ORDER — SODIUM CHLORIDE 0.9 % IV BOLUS
1000.0000 mL | Freq: Once | INTRAVENOUS | Status: AC
  Administered 2020-03-20: 1000 mL via INTRAVENOUS

## 2020-03-20 MED ORDER — ACETAMINOPHEN 325 MG PO TABS
650.0000 mg | ORAL_TABLET | Freq: Once | ORAL | Status: AC
  Administered 2020-03-20: 650 mg via ORAL

## 2020-03-20 MED ORDER — ACETAMINOPHEN 325 MG PO TABS
650.0000 mg | ORAL_TABLET | Freq: Once | ORAL | Status: AC
  Administered 2020-03-20: 650 mg via ORAL
  Filled 2020-03-20: qty 2

## 2020-03-20 MED ORDER — IOHEXOL 350 MG/ML SOLN
100.0000 mL | Freq: Once | INTRAVENOUS | Status: AC | PRN
  Administered 2020-03-20: 100 mL via INTRAVENOUS

## 2020-03-20 MED ORDER — KETOROLAC TROMETHAMINE 15 MG/ML IJ SOLN
15.0000 mg | Freq: Once | INTRAMUSCULAR | Status: AC
  Administered 2020-03-20: 15 mg via INTRAVENOUS
  Filled 2020-03-20: qty 1

## 2020-03-20 NOTE — ED Notes (Signed)
O2 sat 88 percent on room air while lying down, Dr. Karle Starch aware, place on 2L/[place] O2

## 2020-03-20 NOTE — ED Notes (Signed)
Patient transported to CT 

## 2020-03-20 NOTE — ED Notes (Signed)
Ambulated patient in room due to possible covid. Patient was VERY unsteady on his feet, stated that this started when he got sick. SAT was 94-96% when walking, but sat earlier due to almost falling. MD aware

## 2020-03-20 NOTE — ED Triage Notes (Signed)
Vision changes, Fever of 103-took Tylenol and Ibuprofen and disorientated since Sunday.  Patient is fully vaccinated.

## 2020-03-20 NOTE — ED Provider Notes (Signed)
[facility] EMERGENCY DEPARTMENT Provider Note  CSN: [id number] Arrival date & time: 03/20/20 1008    History Chief Complaint  Patient presents with  . Fever  . bodyaches    Vision changes    HPI  Leonard [name] is a 60 y.o. male reports he began running a fever on Sunday, 4 days ago, which he has been treating with tylenol. He has had some cough, mild SOB and general malaise. He report some visual changes but not able to further describe them. Denies diplopia. Patient reports he has felt lightheaded and BP was low at home yesterday.    Past Medical History:  Diagnosis Date  . Abnormal EKG    anterolateral T wave inversion  . Anxiety   . Arthritis    "lower back, maybe some in my neck" (08/05/2017)  . 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 Chronic lower back pain    spondylolisthesis,spondylosis,DDD,stenosis  . Depression   . Dizziness   . Hyperlipidemia    was on meds but has been off x 5 months d/t elevated liver enzymes  . Hyperlipidemia    statin intolerant because of elevated liver function tests  . Iliac artery aneurysm, bilateral ([facility])    atable at 2.2 cm 07/28/17  . Insomnia    takes Trazodone nightly  . Numbness and tingling of both legs   . Opiate addiction ([place])   . Pancreatitis 08/03/2017  . PONV (postoperative nausea and vomiting)   . Skin cancer    "forehead; right posterior neck"  . Weakness    pain and occasionally tingling in right arm and leg    Past Surgical History:  Procedure Laterality Date  . ANTERIOR CERVICAL DECOMP/DISCECTOMY FUSION  2003, 2007   x 2  . ANTERIOR CERVICAL DECOMP/DISCECTOMY FUSION N/A 10/13/2017   Procedure: Cervical Four-Five Cervical Five-Six Anterior cervical decompression/discectomy/fusion with exploration of Cervical Three-Four, Cervical Six-Seven fusions/possible removal of hardware;  Surgeon: Erline Levine, MD;  Location: [facility];  Service: Neurosurgery;  Laterality: N/A;  Cervical Four-Five  Cervical Five-Six Anterior cervical decompression/discectomy/fusion with exploration of Cervical  . ANTERIOR LAT LUMBAR FUSION Right 09/29/2018   Procedure: Right Lumbar three-lumbar four Anterolateral lumbar interbody fusion with lateral plate;  Surgeon: Erline Levine, MD;  Location: [facility];  Service: Neurosurgery;  Laterality: Right;  . APPENDECTOMY  as a child  . BACK SURGERY    . BILIARY STENT PLACEMENT N/A 03/21/2019   Procedure: BILIARY STENT PLACEMENT;  Surgeon: Clarene Essex, MD;  Location: [facility];  Service: Endoscopy;  Laterality: N/A;  . COLONOSCOPY    . ERCP N/A 03/21/2019   Procedure: ENDOSCOPIC RETROGRADE CHOLANGIOPANCREATOGRAPHY (ERCP) with stent placement;  Surgeon: Clarene Essex, MD;  Location: [facility];  Service: Endoscopy;  Laterality: N/A;  . KNEE ARTHROSCOPY Right 2011  . LUMBAR LAMINECTOMY  2014   L4-5  . MOHS SURGERY     "forehead; right posterior neck"  . POSTERIOR LUMBAR FUSION  2015  . radial nerve impingement Right 2009   "near elbor"  . SPHINCTEROTOMY  03/21/2019   Procedure: SPHINCTEROTOMY;  Surgeon: Clarene Essex, MD;  Location: [facility];  Service: Endoscopy;;  balloon sweep  . TIBIA FRACTURE SURGERY Right as a child   compound fracture    Family History  Problem Relation Age of Onset  . Arthritis Mother   . Cirrhosis Father   . Stroke Neg Hx     Social History   Tobacco Use  . Smoking status: Former Smoker  Packs/day: 0.50    Years: 2.00    Pack years: 1.00    Types: Cigarettes  . Smokeless tobacco: Former Systems developer    Types: Snuff    Quit date: 09/14/2017  . Tobacco comment: smoked while he was in high school;   Vaping Use  . Vaping Use: Never used  Substance Use Topics  . Alcohol use: Not Currently    Alcohol/week: 84.0 standard drinks    Types: 84 Glasses of wine per week    Comment: 08/05/2017 "2 bottles of red wine/night", 09/26/2018 not currently drinking alcohol  . Drug use: Not Currently     Home Medications Prior to Admission  medications   Medication Sig Start Date End Date Taking? Authorizing Provider  acetaminophen (TYLENOL) 325 MG tablet Take 2 tablets (650 mg total) by mouth every 6 (six) hours as needed for mild pain. 04/18/19   Norm Parcel, PA-C  aspirin (BAYER LOW DOSE) 81 MG EC tablet Take 81 mg by mouth daily. Swallow whole.    [provider]  Cholecalciferol (VITAMIN D3) 50 MCG (2000 UT) TABS Take 2,000 Units by mouth daily.    [provider]  Cyanocobalamin (B-12) 5000 MCG SUBL Place 5,000 mcg under the tongue daily.    [provider]  docusate sodium (COLACE) 100 MG capsule Take 100 mg by mouth daily as needed for mild constipation.    [provider]  fluticasone (FLONASE) 50 MCG/ACT nasal spray Place 1-2 sprays into both nostrils daily as needed for allergies or rhinitis.    [provider]  ibuprofen (ADVIL) 200 MG tablet Take 600 mg by mouth 3 (three) times daily.    [provider]  lipase/protease/amylase (CREON) 36000 UNITS CPEP capsule Take 1-2 capsules (36,000-72,000 Units total) by mouth 4 (four) times daily. Patient taking differently: Take 108,000 Units by mouth 3 (three) times daily.  03/08/19   Georgette Shell, MD  Multiple Vitamin (MULTIVITAMIN WITH MINERALS) TABS tablet Take 1 tablet by mouth daily. Patient not taking: Reported on 04/16/2019 03/09/19   Georgette Shell, MD  Multiple Vitamins-Minerals (CENTRUM SILVER 50+MEN) TABS Take 1 tablet by mouth daily with breakfast.    [provider]  naphazoline-pheniramine (NAPHCON-A) 0.025-0.3 % ophthalmic solution Place 1 drop into both eyes 4 (four) times daily as needed (for seasonal allergies).     [provider]  ondansetron (ZOFRAN) 4 MG tablet Take 1 tablet (4 mg total) by mouth every 6 (six) hours as needed for nausea. 03/08/19   Georgette Shell, MD  oxyCODONE (OXY IR/ROXICODONE) 5 MG immediate release tablet Take 1-2 tablets (5-10 mg total) by mouth  every 6 (six) hours as needed for moderate pain or severe pain. 04/18/19   Norm Parcel, PA-C  polyethylene glycol (MIRALAX / GLYCOLAX) 17 g packet Take 17 g by mouth daily. Patient taking differently: Take 17 g by mouth daily as needed for mild constipation.  03/09/19   Georgette Shell, MD  pregabalin (LYRICA) 75 MG capsule Take 75 mg by mouth See admin instructions. Take 75 mg by mouth at bedtime and an additional 75 mg once a day as needed for pain    [provider]  rosuvastatin (CRESTOR) 40 MG tablet Take 20 mg by mouth daily.    [provider]  sertraline (ZOLOFT) 50 MG tablet Take 75 mg by mouth 2 (two) times daily.     [provider]  sildenafil (VIAGRA) 100 MG tablet Take 50-100 mg by mouth daily  as needed for erectile dysfunction.  04/07/18   [provider]  traZODone (DESYREL) 150 MG tablet Take 150 mg by mouth at bedtime.    [provider]     Allergies    Sulfa antibiotics and Zocor [simvastatin]   Review of Systems   Review of Systems A comprehensive review of systems was completed and negative except as noted in HPI.    Physical Exam BP 105/68 (BP Location: Left Arm)   Pulse (!) 103   Temp 98.9 F (37.2 C) (Oral)   Resp 18   Ht 5\' 10"  (1.778 m)   Wt 81.6 kg   SpO2 97%   BMI 25.83 kg/m   Physical Exam Vitals and nursing note reviewed.  Constitutional:      Appearance: Normal appearance.  HENT:     Head: Normocephalic and atraumatic.     Nose: Nose normal.     Mouth/Throat:     Mouth: Mucous membranes are dry.  Eyes:     Extraocular Movements: Extraocular movements intact.     Conjunctiva/sclera: Conjunctivae normal.  Cardiovascular:     Rate and Rhythm: Normal rate.  Pulmonary:     Effort: Pulmonary effort is normal.     Breath sounds: Normal breath sounds.  Abdominal:     General: Abdomen is flat.     Palpations: Abdomen is soft.     Tenderness: There is no abdominal tenderness.   Musculoskeletal:        General: No swelling. Normal range of motion.     Cervical back: Neck supple.  Skin:    General: Skin is warm and dry.  Neurological:     General: No focal deficit present.     Mental Status: He is alert.  Psychiatric:        Mood and Affect: Mood normal.      ED Results / Procedures / Treatments   Labs (all labs ordered are listed, but only abnormal results are displayed) Labs Reviewed - No data to display  EKG None   Radiology No results found.  Procedures Procedures  Medications Ordered in the ED Medications - No data to display   MDM Rules/Calculators/A&P MDM Patient with URI symptoms has been vaccinated for Covid reports he was exposed last weekend before he got sick. He had initial SpO2 normal in triage, but on my evaluation he dropped to 91% while talking. Will check ambulatory SpO2. Add labs, CXR and Covid swab.  ED Course  I have reviewed the triage vital signs and the nursing notes.  Pertinent labs & imaging results that were available during my care of the patient were reviewed by me and considered in my medical decision making (see chart for details).  Clinical Course as of 03/21/20 1444  Thu Mar 20, 2020  1236 Per RT, patient very dizzy with standing up and had to be assisted back to bed. Was not hypoxic, but unable to exert himself to check. Will add orthostatics.  [CS]  7253 CMP with mild AKI.  [CS]  6644 CBC is normal.  [CS]  0347 Patient's Covid and Flu are negative. He is running a fever now and SpO2 continues to dip into upper 90s. He feels weak with standing but no focal deficits on re-exam. Will expand workup to including blood cultures, lactic acid, CT chest to rule out PE or occult PNA. Add trop and BNP as well.  [CS]  4259 Care of the patient signed out to Dr. Ron Parker at the change of  shift.  [CS]  1710 WBC: 9.4 [EK]    Clinical Course User Index [CS] Truddie Hidden, MD [EK] Breck Coons, MD    Final Clinical  Impression(s) / ED Diagnoses Final diagnoses:  None    Rx / DC Orders ED Discharge Orders    None       Truddie Hidden, MD 03/21/20 1444

## 2020-03-20 NOTE — ED Notes (Signed)
O2

## 2020-03-20 NOTE — Progress Notes (Addendum)
Patient is a 60 year old male with history of chronic pancreatitis, chronic back pain, history of opioid addiction, hyperlipidemia, depression who presents from home to [facility] with complaints of fever, diarrhea,, shortness of breath.  On presentation he was found to be hypoxic requiring 3 L of oxygen per minute.  He desaturated on room air.  He was mildly tachycardic.  COVID was initially suspected but COVID screening test has been negative.  He was febrile on presentation chest imaging did not show any PE or pneumonia or any other definitive source of hypoxia.  Lab work showed mild hypokalemia, AKI with creatinine of 1.4.  Baseline creatinine is normal.  His BNP is mildly elevated. Patient will be admitted for observation and  further management of AKI secondary to diarrhea likely  needing IV fluids, fever of unknown origin and management of hypoxia .  We will assess his volume status when he presents here. I have requested the ED physician to repeat the COVID screening test.  If he improves within the next 4 to 6 hours, I have requested the ED physician to cancel the transfer here  if he is appropriate for discharge to home with medications if needed.

## 2020-03-20 NOTE — ED Notes (Signed)
ED Provider at bedside, dr Ron Parker

## 2020-03-20 NOTE — ED Provider Notes (Signed)
Medical Decision Making: Care of patient assumed from Dr. Karle Starch at [phone].  Agree with history, physical exam and plan.  See their note for further details.  Briefly, The pt p/w body aches and flu like symptoms CBC unremarkable other than mild thrombocytopenia, chemistry fairly unremarkable slightly elevated creatinine, viral swab negative.  Lactic acid negative.  Troponin in process.  EKG shows sinus rhythm without focal acute ischemic changes, some nonspecific T wave changes.  So waiting for BNP troponin, CTA of the chest, patient has had hypoxia and tachycardia.  Febrile as well..  When comparing creatinine to old it is markedly elevated from point .8 or .9, now at 1.45  Current plan is as follows: CTA and reassess, will need admission for acute hypoxic respiratory failure, currently unknown origin.  CTA shows atelectasis in the bases but no PE and no other concerning findings, the patient remains on supplemental oxygen due to shortness of breath, 3 L, attempted to turn it down however patient desaturated.,  I do not have a source for this patient's hypoxic this time.  Mild elevation in BNP but on exam and on imaging does not show signs of fluid overload.  His symptoms are very consistent with a viral illness and had close exposure to COVID.  I consulted the hospitalist for admission, they have question as well as to where the hypoxia is coming from and what we are admitting for, currently admission for acute hypoxic respiratory failure of unknown source.  With history of diarrhea and negative lung x-ray and CT imaging we will hold antibiotics at this time.  As they could make diarrhea worse and not sure what would be treating.  Family states that there is a hepatobiliary stent due to chronic pancreatitis, we can image the abdomen and see if there is a complication there.  But would not think that is likely to cause hypoxia without findings on CT lung exam.  The patient will be admitted to the hospitalist.   For the remainder this patient's care please see inpatient team notes.  I will intervene as needed while the patient remains in the emergency department.   I personally reviewed and interpreted all labs/imaging.      Breck Coons, MD 03/24/20 [phone]

## 2020-03-21 DIAGNOSIS — J9601 Acute respiratory failure with hypoxia: Secondary | ICD-10-CM

## 2020-03-21 DIAGNOSIS — E876 Hypokalemia: Secondary | ICD-10-CM | POA: Diagnosis not present

## 2020-03-21 DIAGNOSIS — Z8261 Family history of arthritis: Secondary | ICD-10-CM | POA: Diagnosis not present

## 2020-03-21 DIAGNOSIS — Z20822 Contact with and (suspected) exposure to covid-19: Secondary | ICD-10-CM | POA: Diagnosis not present

## 2020-03-21 DIAGNOSIS — A419 Sepsis, unspecified organism: Secondary | ICD-10-CM | POA: Diagnosis not present

## 2020-03-21 DIAGNOSIS — M479 Spondylosis, unspecified: Secondary | ICD-10-CM | POA: Diagnosis not present

## 2020-03-21 DIAGNOSIS — R531 Weakness: Secondary | ICD-10-CM | POA: Diagnosis not present

## 2020-03-21 DIAGNOSIS — F1021 Alcohol dependence, in remission: Secondary | ICD-10-CM | POA: Diagnosis not present

## 2020-03-21 DIAGNOSIS — R0602 Shortness of breath: Secondary | ICD-10-CM | POA: Diagnosis present

## 2020-03-21 DIAGNOSIS — Z87891 Personal history of nicotine dependence: Secondary | ICD-10-CM | POA: Diagnosis not present

## 2020-03-21 DIAGNOSIS — G4700 Insomnia, unspecified: Secondary | ICD-10-CM | POA: Diagnosis not present

## 2020-03-21 DIAGNOSIS — N179 Acute kidney failure, unspecified: Secondary | ICD-10-CM | POA: Diagnosis not present

## 2020-03-21 DIAGNOSIS — D696 Thrombocytopenia, unspecified: Secondary | ICD-10-CM | POA: Diagnosis not present

## 2020-03-21 DIAGNOSIS — F32A Depression, unspecified: Secondary | ICD-10-CM | POA: Diagnosis not present

## 2020-03-21 DIAGNOSIS — K861 Other chronic pancreatitis: Secondary | ICD-10-CM | POA: Diagnosis not present

## 2020-03-21 DIAGNOSIS — G8929 Other chronic pain: Secondary | ICD-10-CM | POA: Diagnosis not present

## 2020-03-21 DIAGNOSIS — Z85828 Personal history of other malignant neoplasm of skin: Secondary | ICD-10-CM | POA: Diagnosis not present

## 2020-03-21 DIAGNOSIS — J189 Pneumonia, unspecified organism: Secondary | ICD-10-CM | POA: Diagnosis not present

## 2020-03-21 DIAGNOSIS — Z9049 Acquired absence of other specified parts of digestive tract: Secondary | ICD-10-CM | POA: Diagnosis not present

## 2020-03-21 DIAGNOSIS — M4310 Spondylolisthesis, site unspecified: Secondary | ICD-10-CM | POA: Diagnosis not present

## 2020-03-21 DIAGNOSIS — Z981 Arthrodesis status: Secondary | ICD-10-CM | POA: Diagnosis not present

## 2020-03-21 DIAGNOSIS — Z79899 Other long term (current) drug therapy: Secondary | ICD-10-CM | POA: Diagnosis not present

## 2020-03-21 DIAGNOSIS — F419 Anxiety disorder, unspecified: Secondary | ICD-10-CM | POA: Diagnosis not present

## 2020-03-21 DIAGNOSIS — M545 Low back pain, unspecified: Secondary | ICD-10-CM | POA: Diagnosis not present

## 2020-03-21 DIAGNOSIS — E785 Hyperlipidemia, unspecified: Secondary | ICD-10-CM | POA: Diagnosis not present

## 2020-03-21 LAB — CBC WITH DIFFERENTIAL/PLATELET
Abs Immature Granulocytes: 0.22 10*3/uL — ABNORMAL HIGH (ref 0.00–0.07)
Basophils Absolute: 0 10*3/uL (ref 0.0–0.1)
Basophils Relative: 0 %
Eosinophils Absolute: 0 10*3/uL (ref 0.0–0.5)
Eosinophils Relative: 0 %
HCT: 37.6 % — ABNORMAL LOW (ref 39.0–52.0)
Hemoglobin: 12.7 g/dL — ABNORMAL LOW (ref 13.0–17.0)
Immature Granulocytes: 2 %
Lymphocytes Relative: 9 %
Lymphs Abs: 0.9 10*3/uL (ref 0.7–4.0)
MCH: 30.8 pg (ref 26.0–34.0)
MCHC: 33.8 g/dL (ref 30.0–36.0)
MCV: 91.3 fL (ref 80.0–100.0)
Monocytes Absolute: 1 10*3/uL (ref 0.1–1.0)
Monocytes Relative: 10 %
Neutro Abs: 7.6 10*3/uL (ref 1.7–7.7)
Neutrophils Relative %: 79 %
Platelets: 141 10*3/uL — ABNORMAL LOW (ref 150–400)
RBC: 4.12 MIL/uL — ABNORMAL LOW (ref 4.22–5.81)
RDW: 14.6 % (ref 11.5–15.5)
Smear Review: NORMAL
WBC: 9.8 10*3/uL (ref 4.0–10.5)
nRBC: 0 % (ref 0.0–0.2)

## 2020-03-21 LAB — BASIC METABOLIC PANEL
Anion gap: 12 (ref 5–15)
BUN: 23 mg/dL — ABNORMAL HIGH (ref 6–20)
CO2: 24 mmol/L (ref 22–32)
Calcium: 8.6 mg/dL — ABNORMAL LOW (ref 8.9–10.3)
Chloride: 103 mmol/L (ref 98–111)
Creatinine, Ser: 1.24 mg/dL (ref 0.61–1.24)
GFR, Estimated: >60 mL/min (ref >60)
Glucose, Bld: 137 mg/dL — ABNORMAL HIGH (ref 70–99)
Potassium: 3.2 mmol/L — ABNORMAL LOW (ref 3.5–5.1)
Sodium: 139 mmol/L (ref 135–145)

## 2020-03-21 LAB — SARS CORONAVIRUS 2 (TAT 6-24 HRS): SARS Coronavirus 2: NEGATIVE

## 2020-03-21 MED ORDER — LORAZEPAM 1 MG PO TABS
2.0000 mg | ORAL_TABLET | Freq: Once | ORAL | Status: AC
  Administered 2020-03-21: 2 mg via ORAL
  Filled 2020-03-21: qty 2

## 2020-03-21 MED ORDER — ENOXAPARIN SODIUM 40 MG/0.4ML ~~LOC~~ SOLN
40.0000 mg | SUBCUTANEOUS | Status: DC
  Administered 2020-03-21: 21:00:00 40 mg via SUBCUTANEOUS
  Filled 2020-03-21: qty 0.4

## 2020-03-21 MED ORDER — ACETAMINOPHEN 325 MG PO TABS
650.0000 mg | ORAL_TABLET | Freq: Four times a day (QID) | ORAL | Status: AC | PRN
  Administered 2020-03-21 – 2020-03-22 (×2): 650 mg via ORAL
  Filled 2020-03-21 (×2): qty 2

## 2020-03-21 MED ORDER — KETOROLAC TROMETHAMINE 15 MG/ML IJ SOLN
15.0000 mg | Freq: Once | INTRAMUSCULAR | Status: AC
  Administered 2020-03-21: 15 mg via INTRAVENOUS
  Filled 2020-03-21: qty 1

## 2020-03-21 MED ORDER — SODIUM CHLORIDE 0.9 % IV BOLUS
500.0000 mL | Freq: Once | INTRAVENOUS | Status: AC
  Administered 2020-03-21: 500 mL via INTRAVENOUS

## 2020-03-21 MED ORDER — ONDANSETRON HCL 4 MG PO TABS: 4.0000 mg | ORAL_TABLET | Freq: Four times a day (QID) | ORAL | Status: DC | PRN

## 2020-03-21 MED ORDER — ACETAMINOPHEN 325 MG PO TABS
650.0000 mg | ORAL_TABLET | Freq: Once | ORAL | Status: AC
  Administered 2020-03-21: 650 mg via ORAL
  Filled 2020-03-21: qty 2

## 2020-03-21 MED ORDER — POTASSIUM CHLORIDE IN NACL 20-0.9 MEQ/L-% IV SOLN
INTRAVENOUS | Status: DC
  Filled 2020-03-21: qty 1000

## 2020-03-21 MED ORDER — ONDANSETRON HCL 4 MG/2ML IJ SOLN
4.0000 mg | Freq: Four times a day (QID) | INTRAMUSCULAR | Status: DC | PRN
  Administered 2020-03-22: 22:00:00 4 mg via INTRAVENOUS
  Filled 2020-03-21: qty 2

## 2020-03-21 NOTE — Progress Notes (Addendum)
[facility] coverage called per orders of elevated temperature. Dr. Kennon Holter with call back. See new orders and mar for bolus. Charge Nurse made aware.  03/21/20 2204  Vitals  Temp (!) 102.4 F (39.1 C)  Temp Source Oral  BP 136/73  MAP (mmHg) 90  BP Method Automatic  Pulse Rate (!) 109  Pulse Rate Source Monitor  Resp 19  Level of Consciousness  Level of Consciousness Alert  MEWS COLOR  MEWS Score Color Yellow  Oxygen Therapy  SpO2 (!) 87 % (pt ambulated to bathroom oxygen taken off)  PCA/Epidural/Spinal Assessment  Respiratory Pattern Dyspnea with exertion  Glasgow Coma Scale  Eye Opening 4  Best Verbal Response (NON-intubated) 5  Best Motor Response 6  Glasgow Coma Scale Score 15  MEWS Score  MEWS Temp 2  MEWS Systolic 0  MEWS Pulse 1  MEWS RR 0  MEWS LOC 0  MEWS Score 3  Next vital signs due at midnight, tech and charge nurse made aware.

## 2020-03-21 NOTE — ED Notes (Signed)
Went to patient's room for an hourly rounds and noted that patient is sitting at the edge of the bed with bed wet.  He stated that he spill the gingerale.  Linen changed, wet washcloth given and gown changed.  Wife brought his toothbrush and toothpaste.  Patient complaint of chest pain (mid) 5/10 aching pain.

## 2020-03-21 NOTE — Progress Notes (Signed)
Covering MD notified: pt started yellow mews protocol  03/21/20 2204  MEWS Score  Resp 19  Pulse Rate (!) 109  BP 136/73  Temp (!) 102.4 F (39.1 C)  Level of Consciousness Alert  MEWS RR 0  MEWS Pulse 1  MEWS Systolic 0  MEWS LOC 0  MEWS Temp 2  MEWS Score 3  MEWS Score Color Yellow  MEWS ECG HR/ Pulse 109

## 2020-03-21 NOTE — H&P (Signed)
History and Physical   Leonard [name] [id number] DOB: 22-Feb-1961 DOA: 03/20/2020  Referring MD/NP/PA: Dr. Dewaine Conger  PCP: Gaynelle Arabian, MD   Outpatient Specialists: None  Patient coming from: Home via [facility]  Chief Complaint: Shortness of breath and weakness  HPI: Leonard [name] is a 60 y.o. male with medical history significant of depression, anxiety, hyperlipidemia, osteoarthritis, alcohol abuse who quit at the end of December, chronic pancreatitis and chronic opiate addiction who presented to [place] with flulike symptoms going on for 2 days.  Patient also reported some nausea with vomiting.  He quit alcohol at the end of December.  Denies any alcohol withdrawal symptoms.  Noted to have opiate addiction the patient also denied recent withdrawal.  On arrival patient was noted to be requiring oxygen shortness of breath.  He desaturated when on room air but required 3 L.  He reported a lot of headaches and sinus drainage including postnasal drip.  Patient was swabbed for COVID-19 twice both negative.  He was also noted to have mild AKI so he was admitted for further evaluation and treatment.  He has had some fever at home but afebrile here.  Patient does not appear to have any specific symptom except for the headache and the flulike symptoms.  Viruses do not appear to be positive at this point..  ED Course: Temperature 102.4 blood pressure 140/89 pulse 109 respiratory 32 oxygen sat 87% on room air on arrival.  Sodium 139 potassium 3.2 chloride 103 CO2 24 glucose 137 creatinine was 1.45 but down to 1.24.  CBC mostly within normal.  CT abdomen pelvis showed chronic changes mainly of chronic pancreatitis.  Urinalysis negative.  Patient being admitted for further evaluation and treatment.  Review of Systems: As per HPI otherwise 10 point review of systems negative.    Past Medical History:  Diagnosis Date  . Abnormal EKG    anterolateral T wave inversion  .  Anxiety   . Arthritis    "lower back, maybe some in my neck" (08/05/2017)  . 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 Chronic lower back pain    spondylolisthesis,spondylosis,DDD,stenosis  . Depression   . Dizziness   . Hyperlipidemia    was on meds but has been off x 5 months d/t elevated liver enzymes  . Hyperlipidemia    statin intolerant because of elevated liver function tests  . Iliac artery aneurysm, bilateral (HCC)    atable at 2.2 cm 07/28/17  . Insomnia    takes Trazodone nightly  . Numbness and tingling of both legs   . Opiate addiction ([place])   . Pancreatitis 08/03/2017  . PONV (postoperative nausea and vomiting)   . Skin cancer    "forehead; right posterior neck"  . Weakness    pain and occasionally tingling in right arm and leg    Past Surgical History:  Procedure Laterality Date  . ANTERIOR CERVICAL DECOMP/DISCECTOMY FUSION  2003, 2007   x 2  . ANTERIOR CERVICAL DECOMP/DISCECTOMY FUSION N/A 10/13/2017   Procedure: Cervical Four-Five Cervical Five-Six Anterior cervical decompression/discectomy/fusion with exploration of Cervical Three-Four, Cervical Six-Seven fusions/possible removal of hardware;  Surgeon: Erline Levine, MD;  Location: [facility];  Service: Neurosurgery;  Laterality: N/A;  Cervical Four-Five Cervical Five-Six Anterior cervical decompression/discectomy/fusion with exploration of Cervical  . ANTERIOR LAT LUMBAR FUSION Right 09/29/2018   Procedure: Right Lumbar three-lumbar four Anterolateral lumbar interbody fusion with lateral plate;  Surgeon: Erline Levine, MD;  Location: [facility];  Service: Neurosurgery;  Laterality: Right;  . APPENDECTOMY  as a child  . BACK SURGERY    . BILIARY STENT PLACEMENT N/A 03/21/2019   Procedure: BILIARY STENT PLACEMENT;  Surgeon: Clarene Essex, MD;  [facility];  Service: Endoscopy;  Laterality: N/A;  . COLONOSCOPY    . ERCP N/A 03/21/2019   Procedure: ENDOSCOPIC RETROGRADE CHOLANGIOPANCREATOGRAPHY (ERCP) with stent  placement;  Surgeon: Clarene Essex, MD;  [facility]: [facility];  Service: Endoscopy;  Laterality: N/A;  . KNEE ARTHROSCOPY Right 2011  . LUMBAR LAMINECTOMY  2014   L4-5  . MOHS SURGERY     "forehead; right posterior neck"  . POSTERIOR LUMBAR FUSION  2015  . radial nerve impingement Right 2009   "near elbor"  . SPHINCTEROTOMY  03/21/2019   Procedure: SPHINCTEROTOMY;  Surgeon: Clarene Essex, MD;  [facility];  Service: Endoscopy;;  balloon sweep  . TIBIA FRACTURE SURGERY Right as a child   compound fracture     reports that he has quit smoking. His smoking use included cigarettes. He has a 1.00 pack-year smoking history. He quit smokeless tobacco use about 2 years ago.  His smokeless tobacco use included snuff. He reports previous alcohol use of about 84.0 standard drinks of alcohol per week. He reports previous drug use.  Allergies  Allergen Reactions  . Duloxetine     Other reaction(s): Other (See Comments) Suicidal thoughts  . Sulfa Antibiotics Swelling, Rash and Other (See Comments)    Face swells and this class of meds caused welts, also  . Zocor [Simvastatin] Other (See Comments)    Elevated his liver enzymes    Family History  Problem Relation Age of Onset  . Arthritis Mother   . Cirrhosis Father   . Stroke Neg Hx      Prior to Admission medications   Medication Sig Start Date End Date Taking? Authorizing Provider  acetaminophen (TYLENOL) 325 MG tablet Take 2 tablets (650 mg total) by mouth every 6 (six) hours as needed for mild pain. Patient taking differently: Take 975 mg by mouth daily. 04/18/19  Yes Norm Parcel, PA-C  aspirin 81 MG EC tablet Take 81 mg by mouth daily. Swallow whole.   Yes [provider]  Cholecalciferol (VITAMIN D3) 50 MCG (2000 UT) TABS Take 2,000 Units by mouth daily.   Yes [provider]  Cyanocobalamin (B-12) 5000 MCG SUBL Place 5,000 mcg under the tongue daily.   Yes [provider]  docusate sodium  (COLACE) 100 MG capsule Take 100 mg by mouth daily as needed for mild constipation.   Yes [provider]  fluticasone (FLONASE) 50 MCG/ACT nasal spray Place 1-2 sprays into both nostrils daily as needed for allergies or rhinitis.   Yes [provider]  ibuprofen (ADVIL) 200 MG tablet Take 600 mg by mouth daily as needed for mild pain.   Yes [provider]  Multiple Vitamin (MULTIVITAMIN WITH MINERALS) TABS tablet Take 1 tablet by mouth daily. 03/09/19  Yes Georgette Shell, MD  naphazoline-pheniramine (NAPHCON-A) 0.025-0.3 % ophthalmic solution Place 1 drop into both eyes 2 (two) times daily as needed for eye irritation or allergies (for seasonal allergies).   Yes [provider]  polyethylene glycol powder (GLYCOLAX/MIRALAX) 17 GM/SCOOP powder Take 1 Container by mouth daily as needed for mild constipation. 03/09/19  Yes [provider]  pregabalin (LYRICA) 75 MG capsule Take 150 mg by mouth 2 (two) times daily.   Yes [provider]  sertraline (ZOLOFT) 50 MG tablet Take 75 mg by mouth 2 (two) times daily.    Yes [provider]  sildenafil (VIAGRA) 100 MG tablet Take 50-100 mg by mouth daily as needed for erectile dysfunction.  04/07/18  Yes [provider]  traZODone (DESYREL) 150 MG tablet Take 150 mg by mouth at bedtime.   Yes [provider]  lipase/protease/amylase (CREON) 36000 UNITS CPEP capsule Take 1-2 capsules (36,000-72,000 Units total) by mouth 4 (four) times daily. Patient not taking: No sig reported 03/08/19   Georgette Shell, MD  ondansetron (ZOFRAN) 4 MG tablet Take 1 tablet (4 mg total) by mouth every 6 (six) hours as needed for nausea. Patient not taking: Reported on 03/21/2020 03/08/19   Georgette Shell, MD    Physical Exam: Vitals:   03/21/20 1108 03/21/20 1200 03/21/20 1500 03/21/20 1821  BP:  123/81 126/83 (!) 148/89  Pulse:  95 89 95  Resp:  18 (!) 21 16  Temp: 99.7 F (37.6 C)    98.2 F (36.8 C)  TempSrc: Oral   Oral  SpO2:  94% 96% 93%  Weight:    84.1 kg  Height:    5\' 10"  (1.778 m)      Constitutional: Anxious, mildly tremulous no distress Vitals:   03/21/20 1108 03/21/20 1200 03/21/20 1500 03/21/20 1821  BP:  123/81 126/83 (!) 148/89  Pulse:  95 89 95  Resp:  18 (!) 21 16  Temp: 99.7 F (37.6 C)   98.2 F (36.8 C)  TempSrc: Oral   Oral  SpO2:  94% 96% 93%  Weight:    84.1 kg  Height:    5\' 10"  (1.778 m)   Eyes: PERRL, lids and conjunctivae normal ENMT: Mucous membranes are moist. Posterior pharynx clear of any exudate or lesions.Normal dentition.  Neck: normal, supple, no masses, no thyromegaly Respiratory: clear to auscultation bilaterally, no wheezing, no crackles. Normal respiratory effort. No accessory muscle use.  Cardiovascular: Regular rate and rhythm, no murmurs / rubs / gallops. No extremity edema. 2+ pedal pulses. No carotid bruits.  Abdomen: no tenderness, no masses palpated. No hepatosplenomegaly. Bowel sounds positive.  Musculoskeletal: no clubbing / cyanosis. No joint deformity upper and lower extremities. Good ROM, no contractures. Normal muscle tone.  Skin: no rashes, lesions, ulcers. No induration Neurologic: CN 2-12 grossly intact. Sensation intact, DTR normal. Strength 5/5 in all 4.  Tremulous Psychiatric: Normal judgment and insight. Alert and oriented x 3.  Anxious mood.     Labs on Admission: I have personally reviewed following labs and imaging studies  CBC: Recent Labs  Lab 03/20/20 1240 03/21/20 0838  WBC 9.4 9.8  NEUTROABS 8.3* 7.6  HGB 13.9 12.7*  HCT 41.7 37.6*  MCV 91.0 91.3  PLT 146* [id number]*   Basic Metabolic Panel: Recent Labs  Lab 03/20/20 1240 03/21/20 0838  NA 136 139  K 3.4* 3.2*  CL 99 103  CO2 24 24  GLUCOSE 143* 137*  BUN 33* 23*  CREATININE 1.45* 1.24  CALCIUM 9.1 8.6*   GFR: Estimated Creatinine Clearance: 65.4 mL/min (by C-G formula based on SCr of 1.24 mg/dL). Liver Function  Tests: Recent Labs  Lab 03/20/20 1240  AST 37  ALT 45*  ALKPHOS 143*  BILITOT 0.6  PROT 7.3  ALBUMIN 2.9*   Recent Labs  Lab 03/20/20 1622  LIPASE 33   No results for input(s): AMMONIA in the last 168 hours. Coagulation Profile: No results for input(s): INR, PROTIME in the last  168 hours. Cardiac Enzymes: No results for input(s): CKTOTAL, CKMB, CKMBINDEX, TROPONINI in the last 168 hours. BNP (last 3 results) No results for input(s): PROBNP in the last 8760 hours. HbA1C: No results for input(s): HGBA1C in the last 72 hours. CBG: No results for input(s): GLUCAP in the last 168 hours. Lipid Profile: No results for input(s): CHOL, HDL, LDLCALC, TRIG, CHOLHDL, LDLDIRECT in the last 72 hours. Thyroid Function Tests: No results for input(s): TSH, T4TOTAL, FREET4, T3FREE, THYROIDAB in the last 72 hours. Anemia Panel: No results for input(s): VITAMINB12, FOLATE, FERRITIN, TIBC, IRON, RETICCTPCT in the last 72 hours. Urine analysis:    Component Value Date/Time   COLORURINE YELLOW 03/20/2020 1622   APPEARANCEUR CLEAR 03/20/2020 1622   LABSPEC 1.010 03/20/2020 1622   PHURINE 6.0 03/20/2020 1622   GLUCOSEU NEGATIVE 03/20/2020 1622   HGBUR NEGATIVE 03/20/2020 1622   BILIRUBINUR NEGATIVE 03/20/2020 1622   KETONESUR NEGATIVE 03/20/2020 1622   PROTEINUR 30 (A) 03/20/2020 1622   NITRITE NEGATIVE 03/20/2020 1622   LEUKOCYTESUR NEGATIVE 03/20/2020 1622   Sepsis Labs: @LABRCNTIP (procalcitonin:4,lacticidven:4) ) Recent Results (from the past 240 hour(s))  Resp Panel by RT-PCR (Flu A&B, Covid) Nasopharyngeal Swab     Status: None   Collection Time: 03/20/20  1:05 PM   Specimen: Nasopharyngeal Swab; Nasopharyngeal(NP) swabs in vial transport medium  Result Value Ref Range Status   SARS Coronavirus 2 by RT PCR NEGATIVE NEGATIVE Final    Comment: (NOTE) SARS-CoV-2 target nucleic acids are NOT DETECTED.  The SARS-CoV-2 RNA is generally detectable in upper respiratory specimens  during the acute phase of infection. The lowest concentration of SARS-CoV-2 viral copies this assay can detect is 138 copies/mL. A negative result does not preclude SARS-Cov-2 infection and should not be used as the sole basis for treatment or other patient management decisions. A negative result may occur with  improper specimen collection/handling, submission of specimen other than nasopharyngeal swab, presence of viral mutation(s) within the areas targeted by this assay, and inadequate number of viral copies(<138 copies/mL). A negative result must be combined with clinical observations, patient history, and epidemiological information. The expected result is Negative.  Fact Sheet for Patients:  EntrepreneurPulse.com.au  Fact Sheet for Healthcare Providers:  IncredibleEmployment.be  This test is no t yet approved or cleared by the [place] FDA and  has been authorized for detection and/or diagnosis of SARS-CoV-2 by FDA under an Emergency Use Authorization (EUA). This EUA will remain  in effect (meaning this test can be used) for the duration of the COVID-19 declaration under Section 564(b)(1) of the Act, 21 U.S.C.section 360bbb-3(b)(1), unless the authorization is terminated  or revoked sooner.       Influenza A by PCR NEGATIVE NEGATIVE Final   Influenza B by PCR NEGATIVE NEGATIVE Final    Comment: (NOTE) The Xpert Xpress SARS-CoV-2/FLU/RSV plus assay is intended as an aid in the diagnosis of influenza from Nasopharyngeal swab specimens and should not be used as a sole basis for treatment. Nasal washings and aspirates are unacceptable for Xpert Xpress SARS-CoV-2/FLU/RSV testing.  Fact Sheet for Patients: EntrepreneurPulse.com.au  Fact Sheet for Healthcare Providers: IncredibleEmployment.be  This test is not yet approved or cleared by the [place] FDA and has been authorized for detection  and/or diagnosis of SARS-CoV-2 by FDA under an Emergency Use Authorization (EUA). This EUA will remain in effect (meaning this test can be used) for the duration of the COVID-19 declaration under Section 564(b)(1) of the Act, 21 U.S.C. section 360bbb-3(b)(1), unless the authorization  is terminated or revoked.  Performed at [facility], [address] [place], [place] [address]   Culture, blood (routine x 2)     Status: None (Preliminary result)   Collection Time: 03/20/20  2:30 PM   Specimen: Left Antecubital; Blood  Result Value Ref Range Status   Specimen Description   Final    LEFT ANTECUBITAL Performed at [facility], [address] [place], [place] [address]    Special Requests   Final    BOTTLES DRAWN AEROBIC AND ANAEROBIC Blood Culture adequate volume Performed at [facility], [address] [place], [place] [address]    Culture   Final    NO GROWTH < 24 HOURS Performed at [facility], [place] [address], [place], [place] [address]    Report Status PENDING  Incomplete  Culture, blood (routine x 2)     Status: None (Preliminary result)   Collection Time: 03/20/20  2:55 PM   Specimen: Left Antecubital; Blood  Result Value Ref Range Status   Specimen Description   Final    LEFT ANTECUBITAL Performed at [facility], [address] [place], [place] [address]    Special Requests   Final    BOTTLES DRAWN AEROBIC AND ANAEROBIC Blood Culture adequate volume Performed at [facility], [address] [place], [place] [address]    Culture   Final    NO GROWTH < 24 HOURS Performed at [facility], [place] [address] [place], [place] [address]    Report Status PENDING  Incomplete  SARS CORONAVIRUS 2 (TAT 6-24 HRS) Nasopharyngeal Nasopharyngeal Swab     Status: None   Collection Time: 03/20/20  5:17 PM   Specimen: Nasopharyngeal Swab  Result Value Ref Range Status   SARS Coronavirus 2 NEGATIVE NEGATIVE Final     Comment: (NOTE) SARS-CoV-2 target nucleic acids are NOT DETECTED.  The SARS-CoV-2 RNA is generally detectable in upper and lower respiratory specimens during the acute phase of infection. Negative results do not preclude SARS-CoV-2 infection, do not rule out co-infections with other pathogens, and should not be used as the sole basis for treatment or other patient management decisions. Negative results must be combined with clinical observations, patient history, and epidemiological information. The expected result is Negative.  Fact Sheet for Patients: SugarRoll.be  Fact Sheet for Healthcare Providers: https://www.woods-mathews.com/  This test is not yet approved or cleared by the [place] FDA and  has been authorized for detection and/or diagnosis of SARS-CoV-2 by FDA under an Emergency Use Authorization (EUA). This EUA will remain  in effect (meaning this test can be used) for the duration of the COVID-19 declaration under Se ction 564(b)(1) of the Act, 21 U.S.C. section 360bbb-3(b)(1), unless the authorization is terminated or revoked sooner.  Performed at [facility], [place] [address], [place], [place] [address]      Radiological Exams on Admission: CT ABDOMEN PELVIS WO CONTRAST  Result Date: 03/20/2020 CLINICAL DATA:  Sepsis.  Hepatobiliary stent. EXAM: CT ABDOMEN AND PELVIS WITHOUT CONTRAST TECHNIQUE: Multidetector CT imaging of the abdomen and pelvis was performed following the standard protocol without IV contrast. COMPARISON:  Chest CT earlier today. MRI 10/22/2019. CT 05/15/2019. FINDINGS: Lower chest: Volume loss in the left lower lobe that could be simple atelectasis or atelectatic pneumonia. Right lung base is clear. Hepatobiliary: Fatty liver. Some areas of more prominent focal fat adjacent to the falciform  ligament and gallbladder fossa. At the caudal tip of the right lobe, there is a newly seen region of  low-density measuring about 3.7 cm in size, not discretely marginated. There did not seem to be any abnormality in this region on the previous CT or the MRI of 5 months ago. Differential diagnosis includes neoplastic mass lesion, focal fat, or infectious hepatitis. No calcified gallstones. Pancreas: Pancreatic ductal stent remains in place, extending from the mid body through the ampullary into the duodenal C loop. Chronic pancreatic calcifications as seen previously. 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 No new or acute finding. Spleen: Normal Adrenals/Urinary Tract: Adrenal glands are normal. Kidneys are normal. Bladder is normal. Urinary tract contrast related to previous CT of the chest. Stomach/Bowel: No sign of bowel obstruction. No sign of bowel inflammatory disease. Mild sigmoid diverticulosis without evidence of diverticulitis per Vascular/Lymphatic: Aortic atherosclerosis. No aneurysm. Common iliac artery aneurysms, 2.2 cm on the right and 2.4 cm on the left. Reproductive: Normal Other: No free fluid or air. Musculoskeletal: Previous discectomy and fusion procedures L3 through L5. IMPRESSION: 1. Pancreatic ductal stent remains in place, extending from the mid body through the ampullary into the duodenal C loop. 2. Chronic pancreatic calcifications as seen previously. 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 3. Fatty liver. Approximately 3-4 cm region of newly seen low-density at the caudal tip of the right lobe. This could be a mass, focal fat or infectious hepatitis. Small areas of low-density adjacent to the porta hepatis most likely focal fat. 4. Volume loss in the left lower lobe that could be simple atelectasis or atelectatic pneumonia. 5. Aortic atherosclerosis. Common iliac artery aneurysms, 2.2 cm on the right and 2.4 cm on the left. Aortic Atherosclerosis (ICD10-I70.0). Electronically Signed    By: Nelson Chimes M.D.   On: 03/20/2020 17:55   CT Angio Chest PE W/Cm &/Or Wo Cm  Result Date: 03/20/2020 CLINICAL DATA:  Fever, high probability of pulmonary embolus. EXAM: CT ANGIOGRAPHY CHEST WITH CONTRAST TECHNIQUE: Multidetector CT imaging of the chest was performed using the standard protocol during bolus administration of intravenous contrast. Multiplanar CT image reconstructions and MIPs were obtained to evaluate the vascular anatomy. CONTRAST:  159mL OMNIPAQUE IOHEXOL 350 MG/ML SOLN COMPARISON:  None. FINDINGS: Cardiovascular: Satisfactory opacification of the pulmonary arteries to the segmental level. No evidence of pulmonary embolism. Normal heart size. No pericardial effusion. Mediastinum/Nodes: No enlarged mediastinal, hilar, or axillary lymph nodes. Thyroid gland, trachea, and esophagus demonstrate no significant findings. Lungs/Pleura: No pneumothorax or pleural effusion is noted. Right lung is clear. Mild left basilar subsegmental atelectasis is noted. Upper Abdomen: No acute abnormality. Musculoskeletal: No chest wall abnormality. No acute or significant osseous findings. Review of the MIP images confirms the above findings. IMPRESSION: 1. No definite evidence of pulmonary embolus. 2. Mild left basilar subsegmental atelectasis. Electronically Signed   By: Marijo Conception M.D.   On: 03/20/2020 15:21   DG Chest Port 1 View  Result Date: 03/20/2020 CLINICAL DATA:  Cough and fever.  Shortness of breath. EXAM: PORTABLE CHEST 1 VIEW COMPARISON:  None. FINDINGS: The heart size and mediastinal contours are within normal limits. Low lung volumes are noted. Linear opacities in both lung bases may be due to atelectasis or scarring. No evidence of pulmonary consolidation or pleural effusion. IMPRESSION: Low lung volumes with bibasilar atelectasis versus scarring. Electronically Signed   By: Myles Rosenthal.D.  On: 03/20/2020 12:53      Assessment/Plan Principal Problem:   Acute respiratory  failure with hypoxia (HCC) Active Problems:   Opiate dependence (HCC)   Hyperlipidemia   Anxiety and depression   Chronic pancreatitis (HCC)   Alcohol dependence in remission ([place])   AKI (acute kidney injury) ([place])     #1 acute respiratory failure with hypoxia: No identifiable cause.  Patient has no history of COPD or asthma.  He is complain a lot of headache and sinus symptoms.  I suspect acute sinusitis with upper airway.  CT angiogram of the chest showed possible atelectasis at the left base.  Could be early pneumonia but patient has oxygenation has improved and currently on room air.  I will check patient for possible acute sinusitis based on his symptoms.  #2 sepsis syndrome: Patient appeared to have SIRS on arrival to the ER.  No identified source of infection but if pneumonia confound this could be early sepsis.  He is completely improved with fluids.  Doing better right now.  #3 AKI: BUN/creatinine have been improved with hydration.  Seems to be close to baseline.  #4 chronic pancreatitis: Appears to have stabilized.  #5 alcohol abuse: Patient apparently quit more than 2 weeks ago.  He is still tremulous but does not appear to be in DTs.  We will watch and if if any sign of DTs will initiate CIWA protocol  #6 narcotic dependence: Counseling.   DVT prophylaxis: Lovenox Code Status: Full code Family Communication: Wife Disposition Plan: Home Consults called: None Admission status: Observation  Severity of Illness: The appropriate patient status for this patient is OBSERVATION. Observation status is judged to be reasonable and necessary in order to provide the required intensity of service to ensure the patient's safety. The patient's presenting symptoms, physical exam findings, and initial radiographic and laboratory data in the context of their medical condition is felt to place them at decreased risk for further clinical deterioration. Furthermore, it is anticipated that the  patient will be medically stable for discharge from the hospital within 2 midnights of admission. The following factors support the patient status of observation.   " The patient's presenting symptoms include shortness of breath and hypoxia. " The physical exam findings include mainly rhinorrhea. " The initial radiographic and laboratory data are chronic changes.     Barbette Merino MD [facility] Pager [phone]-chronic alcoholism and pancreatitis  If 7PM-7AM, please contact night-coverage [email or web address] Password [facility]  03/21/2020, 7:25 PM

## 2020-03-21 NOTE — ED Notes (Signed)
Pt c/o continued HA (top of head) and neck pain, which is chronic; tremors noted. Pt and wife report pt detoxed from ETOH at home on New Years weekend and has not had a drink since. EDP notified.

## 2020-03-22 ENCOUNTER — Observation Stay (HOSPITAL_COMMUNITY): Payer: Managed Care, Other (non HMO)

## 2020-03-22 DIAGNOSIS — G4700 Insomnia, unspecified: Secondary | ICD-10-CM | POA: Diagnosis present

## 2020-03-22 DIAGNOSIS — K861 Other chronic pancreatitis: Secondary | ICD-10-CM | POA: Diagnosis present

## 2020-03-22 DIAGNOSIS — E785 Hyperlipidemia, unspecified: Secondary | ICD-10-CM | POA: Diagnosis present

## 2020-03-22 DIAGNOSIS — Z8261 Family history of arthritis: Secondary | ICD-10-CM | POA: Diagnosis not present

## 2020-03-22 DIAGNOSIS — D696 Thrombocytopenia, unspecified: Secondary | ICD-10-CM | POA: Diagnosis present

## 2020-03-22 DIAGNOSIS — A419 Sepsis, unspecified organism: Secondary | ICD-10-CM | POA: Diagnosis present

## 2020-03-22 DIAGNOSIS — M545 Low back pain, unspecified: Secondary | ICD-10-CM | POA: Diagnosis present

## 2020-03-22 DIAGNOSIS — N179 Acute kidney failure, unspecified: Secondary | ICD-10-CM | POA: Diagnosis present

## 2020-03-22 DIAGNOSIS — Z20822 Contact with and (suspected) exposure to covid-19: Secondary | ICD-10-CM | POA: Diagnosis present

## 2020-03-22 DIAGNOSIS — G8929 Other chronic pain: Secondary | ICD-10-CM | POA: Diagnosis present

## 2020-03-22 DIAGNOSIS — J189 Pneumonia, unspecified organism: Secondary | ICD-10-CM | POA: Diagnosis present

## 2020-03-22 DIAGNOSIS — F1021 Alcohol dependence, in remission: Secondary | ICD-10-CM | POA: Diagnosis present

## 2020-03-22 DIAGNOSIS — Z981 Arthrodesis status: Secondary | ICD-10-CM | POA: Diagnosis not present

## 2020-03-22 DIAGNOSIS — Z85828 Personal history of other malignant neoplasm of skin: Secondary | ICD-10-CM | POA: Diagnosis not present

## 2020-03-22 DIAGNOSIS — Z9049 Acquired absence of other specified parts of digestive tract: Secondary | ICD-10-CM | POA: Diagnosis not present

## 2020-03-22 DIAGNOSIS — M479 Spondylosis, unspecified: Secondary | ICD-10-CM | POA: Diagnosis present

## 2020-03-22 DIAGNOSIS — F32A Depression, unspecified: Secondary | ICD-10-CM | POA: Diagnosis present

## 2020-03-22 DIAGNOSIS — F419 Anxiety disorder, unspecified: Secondary | ICD-10-CM | POA: Diagnosis present

## 2020-03-22 DIAGNOSIS — Z87891 Personal history of nicotine dependence: Secondary | ICD-10-CM | POA: Diagnosis not present

## 2020-03-22 DIAGNOSIS — E876 Hypokalemia: Secondary | ICD-10-CM | POA: Diagnosis present

## 2020-03-22 DIAGNOSIS — R531 Weakness: Secondary | ICD-10-CM | POA: Diagnosis present

## 2020-03-22 DIAGNOSIS — R0602 Shortness of breath: Secondary | ICD-10-CM | POA: Diagnosis present

## 2020-03-22 DIAGNOSIS — Z79899 Other long term (current) drug therapy: Secondary | ICD-10-CM | POA: Diagnosis not present

## 2020-03-22 DIAGNOSIS — M4310 Spondylolisthesis, site unspecified: Secondary | ICD-10-CM | POA: Diagnosis present

## 2020-03-22 DIAGNOSIS — J9601 Acute respiratory failure with hypoxia: Secondary | ICD-10-CM | POA: Diagnosis present

## 2020-03-22 LAB — COMPREHENSIVE METABOLIC PANEL
ALT: 69 U/L — ABNORMAL HIGH (ref 0–44)
AST: 85 U/L — ABNORMAL HIGH (ref 15–41)
Albumin: 2.8 g/dL — ABNORMAL LOW (ref 3.5–5.0)
Alkaline Phosphatase: 169 U/L — ABNORMAL HIGH (ref 38–126)
Anion gap: 10 (ref 5–15)
BUN: 19 mg/dL (ref 6–20)
CO2: 26 mmol/L (ref 22–32)
Calcium: 8.8 mg/dL — ABNORMAL LOW (ref 8.9–10.3)
Chloride: 105 mmol/L (ref 98–111)
Creatinine, Ser: 0.84 mg/dL (ref 0.61–1.24)
GFR, Estimated: >60 mL/min (ref >60)
Glucose, Bld: 147 mg/dL — ABNORMAL HIGH (ref 70–99)
Potassium: 3.8 mmol/L (ref 3.5–5.1)
Sodium: 141 mmol/L (ref 135–145)
Total Bilirubin: 1.1 mg/dL (ref 0.3–1.2)
Total Protein: 7.1 g/dL (ref 6.5–8.1)

## 2020-03-22 LAB — CBC
HCT: 38 % — ABNORMAL LOW (ref 39.0–52.0)
Hemoglobin: 12.6 g/dL — ABNORMAL LOW (ref 13.0–17.0)
MCH: 30.6 pg (ref 26.0–34.0)
MCHC: 33.2 g/dL (ref 30.0–36.0)
MCV: 92.2 fL (ref 80.0–100.0)
Platelets: 133 10*3/uL — ABNORMAL LOW (ref 150–400)
RBC: 4.12 MIL/uL — ABNORMAL LOW (ref 4.22–5.81)
RDW: 14.7 % (ref 11.5–15.5)
WBC: 7.4 10*3/uL (ref 4.0–10.5)
nRBC: 0 % (ref 0.0–0.2)

## 2020-03-22 LAB — BRAIN NATRIURETIC PEPTIDE: B Natriuretic Peptide: 148.8 pg/mL — ABNORMAL HIGH (ref 0.0–100.0)

## 2020-03-22 LAB — HEPATITIS PANEL, ACUTE
HCV Ab: NONREACTIVE
Hep A IgM: NONREACTIVE
Hep B C IgM: NONREACTIVE
Hepatitis B Surface Ag: NONREACTIVE

## 2020-03-22 LAB — MRSA PCR SCREENING: MRSA by PCR: NEGATIVE

## 2020-03-22 MED ORDER — ACETAMINOPHEN 325 MG PO TABS
650.0000 mg | ORAL_TABLET | Freq: Four times a day (QID) | ORAL | Status: DC | PRN
  Administered 2020-03-22 – 2020-03-23 (×4): 650 mg via ORAL
  Filled 2020-03-22 (×4): qty 2

## 2020-03-22 MED ORDER — GUAIFENESIN-DM 100-10 MG/5ML PO SYRP
10.0000 mL | ORAL_SOLUTION | Freq: Four times a day (QID) | ORAL | Status: DC
  Administered 2020-03-22 – 2020-03-24 (×9): 10 mL via ORAL
  Filled 2020-03-22 (×9): qty 10

## 2020-03-22 MED ORDER — IPRATROPIUM-ALBUTEROL 0.5-2.5 (3) MG/3ML IN SOLN
3.0000 mL | Freq: Four times a day (QID) | RESPIRATORY_TRACT | Status: DC
  Administered 2020-03-22: 3 mL via RESPIRATORY_TRACT
  Filled 2020-03-22: qty 3

## 2020-03-22 MED ORDER — TRAZODONE HCL 50 MG PO TABS
150.0000 mg | ORAL_TABLET | Freq: Every day | ORAL | Status: DC
  Administered 2020-03-22 – 2020-03-23 (×2): 150 mg via ORAL
  Filled 2020-03-22 (×2): qty 1

## 2020-03-22 MED ORDER — DOXYCYCLINE HYCLATE 100 MG PO TABS
100.0000 mg | ORAL_TABLET | Freq: Two times a day (BID) | ORAL | Status: DC
  Administered 2020-03-22 – 2020-03-24 (×5): 100 mg via ORAL
  Filled 2020-03-22 (×5): qty 1

## 2020-03-22 MED ORDER — IPRATROPIUM-ALBUTEROL 0.5-2.5 (3) MG/3ML IN SOLN
3.0000 mL | Freq: Four times a day (QID) | RESPIRATORY_TRACT | Status: DC
  Administered 2020-03-23: 3 mL via RESPIRATORY_TRACT
  Filled 2020-03-22: qty 3

## 2020-03-22 MED ORDER — GUAIFENESIN-DM 100-10 MG/5ML PO SYRP: 5.0000 mL | ORAL_SOLUTION | Freq: Four times a day (QID) | ORAL | Status: DC | PRN

## 2020-03-22 MED ORDER — SODIUM CHLORIDE 0.9 % IV SOLN
2.0000 g | Freq: Three times a day (TID) | INTRAVENOUS | Status: DC
  Administered 2020-03-22: 2 g via INTRAVENOUS
  Filled 2020-03-22: qty 0.28

## 2020-03-22 MED ORDER — PREGABALIN 75 MG PO CAPS
150.0000 mg | ORAL_CAPSULE | Freq: Two times a day (BID) | ORAL | Status: DC
Start: 2020-03-22 — End: 2020-03-24
  Administered 2020-03-22 – 2020-03-24 (×4): 150 mg via ORAL
  Filled 2020-03-22 (×4): qty 2

## 2020-03-22 MED ORDER — IPRATROPIUM-ALBUTEROL 0.5-2.5 (3) MG/3ML IN SOLN: 3.0000 mL | Freq: Two times a day (BID) | RESPIRATORY_TRACT | Status: DC

## 2020-03-22 MED ORDER — VANCOMYCIN HCL 2000 MG/400ML IV SOLN
2000.0000 mg | Freq: Once | INTRAVENOUS | Status: AC
  Administered 2020-03-22: 2000 mg via INTRAVENOUS
  Filled 2020-03-22: qty 400

## 2020-03-22 MED ORDER — SODIUM CHLORIDE 0.9 % IV SOLN
2.0000 g | INTRAVENOUS | Status: DC
  Administered 2020-03-22 – 2020-03-23 (×2): 2 g via INTRAVENOUS
  Filled 2020-03-22 (×2): qty 2

## 2020-03-22 MED ORDER — IPRATROPIUM-ALBUTEROL 0.5-2.5 (3) MG/3ML IN SOLN
3.0000 mL | Freq: Four times a day (QID) | RESPIRATORY_TRACT | Status: DC
  Administered 2020-03-22 (×2): 3 mL via RESPIRATORY_TRACT
  Filled 2020-03-22 (×2): qty 3

## 2020-03-22 MED ORDER — VANCOMYCIN HCL 1250 MG/250ML IV SOLN: 1250.0000 mg | Freq: Two times a day (BID) | INTRAVENOUS | Status: DC

## 2020-03-22 MED ORDER — SERTRALINE HCL 50 MG PO TABS
75.0000 mg | ORAL_TABLET | Freq: Two times a day (BID) | ORAL | Status: DC
  Administered 2020-03-22 – 2020-03-24 (×4): 75 mg via ORAL
  Filled 2020-03-22 (×4): qty 1

## 2020-03-22 NOTE — Plan of Care (Signed)

## 2020-03-22 NOTE — Progress Notes (Addendum)
PROGRESS NOTE    Leonard [name]  [id number] DOB: 1960/03/26 DOA: 03/20/2020 PCP: Gaynelle Arabian, MD   Chief Complain:SoB,fever  Brief Narrative:  Patient is a 60 year old male with history of chronic pancreatitis, chronic back pain, history of opioid addiction, hyperlipidemia, depression who presents from home to [place] with complaints of fever, diarrhea,, shortness of breath.  On presentation he was found to be hypoxic requiring 3 L of oxygen per minute.  He desaturated on room air.  He was mildly tachycardic.  COVID was initially suspected but COVID screening test has been negative.  He was febrile on presentation chest imaging did not show any PE or pneumonia or any other definitive source of hypoxia.  Lab work showed mild hypokalemia, AKI with creatinine of 1.4.  Baseline creatinine is normal.  His BNP is mildly elevated. Patient will  admitted for further management of AKI secondary to diarrhea , fever of unknown origin and management of hypoxia .    Assessment & Plan:   Principal Problem:   Acute respiratory failure with hypoxia (HCC) Active Problems:   Opiate dependence (HCC)   Hyperlipidemia   Anxiety and depression   Chronic pancreatitis (HCC)   Alcohol dependence in remission ([place])   AKI (acute kidney injury) ([place])   Acute hypoxic respiratory failure: No definitive etiology.  COVID, influenza screen negative.  No history of COPD.  Currently requiring 2 L of oxygen per minute.  Complains of shortness of breath, cough.  Has bibasilar crackles. Extensive studies done.  CT angio did not show any PE pneumonia or pleural effusion.  We will check follow-up chest x-ray today.  Continue supplemental oxygen as needed, continue bronchodilators, cough medications. Has mild elevated BNP but will avoid diuretics for now.  AKI: Presented with creatinine of 1.4.  Currently it has resolved with IV fluids.  IV fluids has been stopped.  Fever: Intermittently febrile.  No  definitive source of infection.  Does not have leukocytosis.  We will follow-up cultures, no growth till date.  Started on broad-spectrum antibiotics with vancomycin and cefepime.  Sepsis: Presented with fever, tachycardia, tachypnea.  Sepsis physiology has improved.  Chronic pancreatitis: Currently stable.  Denies any abdominal pain  Chronic alcohol abuse: Quit alcohol about 2 weeks ago.  Currently not in withdrawal.  Continue to monitor closely  Hypokalemia: Supplemented with potassium  Mildly elevated liver enzymes: We will check hepatitis panel  Nicotine dependence: Counseled for cessation  Thrombocytopenia: Mild.  Continue to monitor           DVT prophylaxis:Lovenox Code Status: Full Family Communication: None at bedside Status is: Observation  The patient remains OBS appropriate and will d/c before 2 midnights.  Dispo: The patient is from: Home              Anticipated d/c is to: Home              Anticipated d/c date is: 2 days              Patient currently is not medically stable to d/c.    Consultants: None  Procedures:None  Antimicrobials:  Anti-infectives (From admission, onward)   Start     Dose/Rate Route Frequency Ordered Stop   03/22/20 2230  vancomycin (VANCOREADY) IVPB 1250 mg/250 mL       "Followed by" Linked Group Details   1,250 mg 166.7 mL/hr over 90 Minutes Intravenous Every 12 hours 03/22/20 0935     03/22/20 1030  vancomycin (VANCOREADY) IVPB 2000  mg/400 mL       "Followed by" Linked Group Details   2,000 mg 200 mL/hr over 120 Minutes Intravenous  Once 03/22/20 0935     03/22/20 1030  ceFEPIme (MAXIPIME) 2 g in sodium chloride 0.9 % 100 mL IVPB        2 g 200 mL/hr over 30 Minutes Intravenous Every 8 hours 03/22/20 0935        Subjective: Patient seen and examined at the bedside this morning.  On 2 L oxygen .  Complains of shortness of breath but does not look in respiratory distress placed appears very weak.  Does not feel  great  Objective: Vitals:   03/22/20 0020 03/22/20 0358 03/22/20 0823 03/22/20 0931  BP: (!) 148/83 (!) 141/85 (!) 145/94   Pulse: 100 93 (!) 115   Resp: (!) 23 19 18    Temp: 99.1 F (37.3 C) 97.8 F (36.6 C) 98.4 F (36.9 C)   TempSrc: Oral Oral Oral   SpO2: 93% 97% 93% 95%  Weight:      Height:        Intake/Output Summary (Last 24 hours) at 03/22/2020 1011 Last data filed at 03/22/2020 0500 Gross per 24 hour  Intake 1069.59 ml  Output 250 ml  Net 819.59 ml   Filed Weights   03/20/20 1021 03/21/20 1821  Weight: 81.6 kg 84.1 kg    Examination:  General exam: Weak HEENT:PERRL,Oral mucosa moist, Ear/Nose normal on gross exam Respiratory system: Bilateral mild crackles on the bases Cardiovascular system: S1 & S2 heard, RRR. No JVD, murmurs, rubs, gallops or clicks. No pedal edema. Gastrointestinal system: Abdomen is nondistended, soft and nontender. No organomegaly or masses felt. Normal bowel sounds heard. Central nervous system: Alert and oriented. No focal neurological deficits. Extremities: No edema, no clubbing ,no cyanosis Skin: No rashes, lesions or ulcers,no icterus ,no pallor   Data Reviewed: I have personally reviewed following labs and imaging studies  CBC: Recent Labs  Lab 03/20/20 1240 03/21/20 0838 03/22/20 0616  WBC 9.4 9.8 7.4  NEUTROABS 8.3* 7.6  --   HGB 13.9 12.7* 12.6*  HCT 41.7 37.6* 38.0*  MCV 91.0 91.3 92.2  PLT 146* 141* 009*   Basic Metabolic Panel: Recent Labs  Lab 03/20/20 1240 03/21/20 0838 03/22/20 0616  NA 136 139 141  K 3.4* 3.2* 3.8  CL 99 103 105  CO2 24 24 26   GLUCOSE 143* 137* 147*  BUN 33* 23* 19  CREATININE 1.45* 1.24 0.84  CALCIUM 9.1 8.6* 8.8*   GFR: Estimated Creatinine Clearance: 96.6 mL/min (by C-G formula based on SCr of 0.84 mg/dL). Liver Function Tests: Recent Labs  Lab 03/20/20 1240 03/22/20 0616  AST 37 85*  ALT 45* 69*  ALKPHOS 143* 169*  BILITOT 0.6 1.1  PROT 7.3 7.1  ALBUMIN 2.9* 2.8*    Recent Labs  Lab 03/20/20 1622  LIPASE 33   No results for input(s): AMMONIA in the last 168 hours. Coagulation Profile: No results for input(s): INR, PROTIME in the last 168 hours. Cardiac Enzymes: No results for input(s): CKTOTAL, CKMB, CKMBINDEX, TROPONINI in the last 168 hours. BNP (last 3 results) No results for input(s): PROBNP in the last 8760 hours. HbA1C: No results for input(s): HGBA1C in the last 72 hours. CBG: No results for input(s): GLUCAP in the last 168 hours. Lipid Profile: No results for input(s): CHOL, HDL, LDLCALC, TRIG, CHOLHDL, LDLDIRECT in the last 72 hours. Thyroid Function Tests: No results for input(s): TSH, T4TOTAL, FREET4, T3FREE,  THYROIDAB in the last 72 hours. Anemia Panel: No results for input(s): VITAMINB12, FOLATE, FERRITIN, TIBC, IRON, RETICCTPCT in the last 72 hours. Sepsis Labs: Recent Labs  Lab 03/20/20 1417  LATICACIDVEN 1.1    Recent Results (from the past 240 hour(s))  Resp Panel by RT-PCR (Flu A&B, Covid) Nasopharyngeal Swab     Status: None   Collection Time: 03/20/20  1:05 PM   Specimen: Nasopharyngeal Swab; Nasopharyngeal(NP) swabs in vial transport medium  Result Value Ref Range Status   SARS Coronavirus 2 by RT PCR NEGATIVE NEGATIVE Final    Comment: (NOTE) SARS-CoV-2 target nucleic acids are NOT DETECTED.  The SARS-CoV-2 RNA is generally detectable in upper respiratory specimens during the acute phase of infection. The lowest concentration of SARS-CoV-2 viral copies this assay can detect is 138 copies/mL. A negative result does not preclude SARS-Cov-2 infection and should not be used as the sole basis for treatment or other patient management decisions. A negative result may occur with  improper specimen collection/handling, submission of specimen other than nasopharyngeal swab, presence of viral mutation(s) within the areas targeted by this assay, and inadequate number of viral copies(<138 copies/mL). A negative result  must be combined with clinical observations, patient history, and epidemiological information. The expected result is Negative.  Fact Sheet for Patients:  EntrepreneurPulse.com.au  Fact Sheet for Healthcare Providers:  IncredibleEmployment.be  This test is no t yet approved or cleared by the [place] FDA and  has been authorized for detection and/or diagnosis of SARS-CoV-2 by FDA under an Emergency Use Authorization (EUA). This EUA will remain  in effect (meaning this test can be used) for the duration of the COVID-19 declaration under Section 564(b)(1) of the Act, 21 U.S.C.section 360bbb-3(b)(1), unless the authorization is terminated  or revoked sooner.       Influenza A by PCR NEGATIVE NEGATIVE Final   Influenza B by PCR NEGATIVE NEGATIVE Final    Comment: (NOTE) The Xpert Xpress SARS-CoV-2/FLU/RSV plus assay is intended as an aid in the diagnosis of influenza from Nasopharyngeal swab specimens and should not be used as a sole basis for treatment. Nasal washings and aspirates are unacceptable for Xpert Xpress SARS-CoV-2/FLU/RSV testing.  Fact Sheet for Patients: EntrepreneurPulse.com.au  Fact Sheet for Healthcare Providers: IncredibleEmployment.be  This test is not yet approved or cleared by the [place] FDA and has been authorized for detection and/or diagnosis of SARS-CoV-2 by FDA under an Emergency Use Authorization (EUA). This EUA will remain in effect (meaning this test can be used) for the duration of the COVID-19 declaration under Section 564(b)(1) of the Act, 21 U.S.C. section 360bbb-3(b)(1), unless the authorization is terminated or revoked.  Performed at [facility], [address] [place], [place] [address]   Culture, blood (routine x 2)     Status: None (Preliminary result)   Collection Time: 03/20/20  2:30 PM   Specimen: Left Antecubital; Blood  Result Value  Ref Range Status   Specimen Description   Final    LEFT ANTECUBITAL Performed at [facility], [address] [place], [place] [address]    Special Requests   Final    BOTTLES DRAWN AEROBIC AND ANAEROBIC Blood Culture adequate volume Performed at [facility], [address] [place], [place] [address]    Culture   Final    NO GROWTH < 24 HOURS Performed at [facility], [place] [address], [place], [place] [address]    Report Status PENDING  Incomplete  Culture, blood (routine x 2)     Status: None (Preliminary result)   Collection Time: 03/20/20  2:55 PM   Specimen: Left Antecubital; Blood  Result Value Ref Range Status   Specimen Description   Final    LEFT ANTECUBITAL Performed at [facility], [address] [place], [place] [address]    Special Requests   Final    BOTTLES DRAWN AEROBIC AND ANAEROBIC Blood Culture adequate volume Performed at [facility], [address] [place], [place] [address]    Culture   Final    NO GROWTH < 24 HOURS Performed at [facility], [place] [address] [place], [place] [address]    Report Status PENDING  Incomplete  SARS CORONAVIRUS 2 (TAT 6-24 HRS) Nasopharyngeal Nasopharyngeal Swab     Status: None   Collection Time: 03/20/20  5:17 PM   Specimen: Nasopharyngeal Swab  Result Value Ref Range Status   SARS Coronavirus 2 NEGATIVE NEGATIVE Final    Comment: (NOTE) SARS-CoV-2 target nucleic acids are NOT DETECTED.  The SARS-CoV-2 RNA is generally detectable in upper and lower respiratory specimens during the acute phase of infection. Negative results do not preclude SARS-CoV-2 infection, do not rule out co-infections with other pathogens, and should not be used as the sole basis for treatment or other patient management decisions. Negative results must be combined with clinical observations, patient history, and epidemiological information. The expected result is Negative.  Fact Sheet  for Patients: SugarRoll.be  Fact Sheet for Healthcare Providers: https://www.woods-mathews.com/  This test is not yet approved or cleared by the [place] FDA and  has been authorized for detection and/or diagnosis of SARS-CoV-2 by FDA under an Emergency Use Authorization (EUA). This EUA will remain  in effect (meaning this test can be used) for the duration of the COVID-19 declaration under Se ction 564(b)(1) of the Act, 21 U.S.C. section 360bbb-3(b)(1), unless the authorization is terminated or revoked sooner.  Performed at [facility], [place] [address] [place], [place] [address]          Radiology Studies: CT ABDOMEN PELVIS WO CONTRAST  Result Date: 03/20/2020 CLINICAL DATA:  Sepsis.  Hepatobiliary stent. EXAM: CT ABDOMEN AND PELVIS WITHOUT CONTRAST TECHNIQUE: Multidetector CT imaging of the abdomen and pelvis was performed following the standard protocol without IV contrast. COMPARISON:  Chest CT earlier today. MRI 10/22/2019. CT 05/15/2019. FINDINGS: Lower chest: Volume loss in the left lower lobe that could be simple atelectasis or atelectatic pneumonia. Right lung base is clear. Hepatobiliary: Fatty liver. Some areas of more prominent focal fat adjacent to the falciform ligament and gallbladder fossa. At the caudal tip of the right lobe, there is a newly seen region of low-density measuring about 3.7 cm in size, not discretely marginated. There did not seem to be any abnormality in this region on the previous CT or the MRI of 5 months ago. Differential diagnosis includes neoplastic mass lesion, focal fat, or infectious hepatitis. No calcified gallstones. Pancreas: Pancreatic ductal stent remains in place, extending from the mid body through the ampullary into the duodenal C loop. Chronic pancreatic calcifications as seen previously. 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 No new or  acute finding. Spleen: Normal Adrenals/Urinary Tract: Adrenal glands are normal. Kidneys are normal. Bladder is normal. Urinary tract contrast related to previous CT of the chest. Stomach/Bowel: No sign of bowel obstruction. No sign of bowel inflammatory disease.  Mild sigmoid diverticulosis without evidence of diverticulitis per Vascular/Lymphatic: Aortic atherosclerosis. No aneurysm. Common iliac artery aneurysms, 2.2 cm on the right and 2.4 cm on the left. Reproductive: Normal Other: No free fluid or air. Musculoskeletal: Previous discectomy and fusion procedures L3 through L5. IMPRESSION: 1. Pancreatic ductal stent remains in place, extending from the mid body through the ampullary into the duodenal C loop. 2. Chronic pancreatic calcifications as seen previously. 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 3. Fatty liver. Approximately 3-4 cm region of newly seen low-density at the caudal tip of the right lobe. This could be a mass, focal fat or infectious hepatitis. Small areas of low-density adjacent to the porta hepatis most likely focal fat. 4. Volume loss in the left lower lobe that could be simple atelectasis or atelectatic pneumonia. 5. Aortic atherosclerosis. Common iliac artery aneurysms, 2.2 cm on the right and 2.4 cm on the left. Aortic Atherosclerosis (ICD10-I70.0). Electronically Signed   By: Nelson Chimes M.D.   On: 03/20/2020 17:55   CT Angio Chest PE W/Cm &/Or Wo Cm  Result Date: 03/20/2020 CLINICAL DATA:  Fever, high probability of pulmonary embolus. EXAM: CT ANGIOGRAPHY CHEST WITH CONTRAST TECHNIQUE: Multidetector CT imaging of the chest was performed using the standard protocol during bolus administration of intravenous contrast. Multiplanar CT image reconstructions and MIPs were obtained to evaluate the vascular anatomy. CONTRAST:  1108mL OMNIPAQUE IOHEXOL 350 MG/ML SOLN COMPARISON:  None. FINDINGS: Cardiovascular: Satisfactory opacification  of the pulmonary arteries to the segmental level. No evidence of pulmonary embolism. Normal heart size. No pericardial effusion. Mediastinum/Nodes: No enlarged mediastinal, hilar, or axillary lymph nodes. Thyroid gland, trachea, and esophagus demonstrate no significant findings. Lungs/Pleura: No pneumothorax or pleural effusion is noted. Right lung is clear. Mild left basilar subsegmental atelectasis is noted. Upper Abdomen: No acute abnormality. Musculoskeletal: No chest wall abnormality. No acute or significant osseous findings. Review of the MIP images confirms the above findings. IMPRESSION: 1. No definite evidence of pulmonary embolus. 2. Mild left basilar subsegmental atelectasis. Electronically Signed   By: Marijo Conception M.D.   On: 03/20/2020 15:21   DG Chest Port 1 View  Result Date: 03/20/2020 CLINICAL DATA:  Cough and fever.  Shortness of breath. EXAM: PORTABLE CHEST 1 VIEW COMPARISON:  None. FINDINGS: The heart size and mediastinal contours are within normal limits. Low lung volumes are noted. Linear opacities in both lung bases may be due to atelectasis or scarring. No evidence of pulmonary consolidation or pleural effusion. IMPRESSION: Low lung volumes with bibasilar atelectasis versus scarring. Electronically Signed   By: Marlaine Hind M.D.   On: 03/20/2020 12:53        Scheduled Meds: . enoxaparin (LOVENOX) injection  40 mg Subcutaneous Q24H  . guaiFENesin-dextromethorphan  10 mL Oral Q6H  . ipratropium-albuterol  3 mL Nebulization Q6H   Continuous Infusions: . ceFEPime (MAXIPIME) IV 2 g (03/22/20 1003)  . vancomycin     Followed by  . vancomycin       LOS: 0 days    Time spent: More than 50% of that time was spent in counseling and/or coordination of care.      Shelly Coss, MD [facility] P1/15/2022, 10:11 AM

## 2020-03-22 NOTE — Progress Notes (Signed)
Pharmacy Antibiotic Note  Leonard [name] is a 60 y.o. male admitted on 03/20/2020 with shortness of breath and weakness.  Pharmacy has been consulted for vancomycin and cefepime dosing for sepsis.  Plan: Cefepime 2 g iv q 8 houirs  Vancomycin 2000 mg iv loading dose followed by vancomycin 1250 mg IV Q 12 hrs. Goal AUC 400-550. Expected AUC: 488 SCr used: 0.84  MRSA PCR ordered though unclear source of infection  Will f/u renal function, clinical course, and culture results   Height: 5\' 10"  (177.8 cm) Weight: 84.1 kg (185 lb 6.5 oz) IBW/kg (Calculated) : 73  Temp (24hrs), Avg:99.3 F (37.4 C), Min:97.8 F (36.6 C), Max:102.4 F (39.1 C)  Recent Labs  Lab 03/20/20 1240 03/20/20 1417 03/21/20 0838 03/22/20 0616  WBC 9.4  --  9.8 7.4  CREATININE 1.45*  --  1.24 0.84  LATICACIDVEN  --  1.1  --   --     Estimated Creatinine Clearance: 96.6 mL/min (by C-G formula based on SCr of 0.84 mg/dL).    Allergies  Allergen Reactions  . Duloxetine     Other reaction(s): Other (See Comments) Suicidal thoughts  . Sulfa Antibiotics Swelling, Rash and Other (See Comments)    Face swells and this class of meds caused welts, also  . Zocor [Simvastatin] Other (See Comments)    Elevated his liver enzymes    Antimicrobials this admission: 1/15 cefepime >>  1/15 vancomycin >>   Dose adjustments this admission:  Microbiology results: 1/13 BCx: ngtd 1/13 resp PCR: covid neg/flu neg 1/15 MRSA PCR:   Thank you for allowing pharmacy to be a part of this patient's care.  [name] Form 03/22/2020 9:36 AM

## 2020-03-22 NOTE — Progress Notes (Signed)
MEWS remains on green, charge nurse and tech updated updated. Next vital signs scheduled for 08:00am  03/22/20 0020  MEWS Score  Resp (!) 23  Pulse Rate 100  BP (!) 148/83  Temp 99.1 F (37.3 C)  Level of Consciousness Alert  MEWS RR 1  MEWS Pulse 0  MEWS Systolic 0  MEWS LOC 0  MEWS Temp 0  MEWS Score 1  MEWS Score Color Green  MEWS ECG HR/ Pulse 100

## 2020-03-22 NOTE — Progress Notes (Signed)
   03/22/20 0020  Vitals  Temp 99.1 F (37.3 C)  Temp Source Oral  BP (!) 148/83  MAP (mmHg) 101  BP Location Left Arm  BP Method Automatic  Patient Position (if appropriate) Lying  Pulse Rate 100  Resp (!) 23  Level of Consciousness  Level of Consciousness Alert  MEWS COLOR  MEWS Score Color Green  Oxygen Therapy  SpO2 93 %  O2 Device Nasal Cannula  O2 Flow Rate (L/min) 2 L/min  Patient Activity (if Appropriate) In bed  Pulse Oximetry Type Intermittent  MEWS Score  MEWS Temp 0  MEWS Systolic 0  MEWS Pulse 0  MEWS RR 1  MEWS LOC 0  MEWS Score 1  Noted temperature decreased. Next set of vital signs due at 02:00 am. MEWS; on green currently.

## 2020-03-23 DIAGNOSIS — J9601 Acute respiratory failure with hypoxia: Secondary | ICD-10-CM | POA: Diagnosis not present

## 2020-03-23 LAB — CBC WITH DIFFERENTIAL/PLATELET
Abs Immature Granulocytes: 0.13 10*3/uL — ABNORMAL HIGH (ref 0.00–0.07)
Basophils Absolute: 0 10*3/uL (ref 0.0–0.1)
Basophils Relative: 0 %
Eosinophils Absolute: 0 10*3/uL (ref 0.0–0.5)
Eosinophils Relative: 0 %
HCT: 35.3 % — ABNORMAL LOW (ref 39.0–52.0)
Hemoglobin: 11.8 g/dL — ABNORMAL LOW (ref 13.0–17.0)
Immature Granulocytes: 1 %
Lymphocytes Relative: 7 %
Lymphs Abs: 0.8 10*3/uL (ref 0.7–4.0)
MCH: 30.4 pg (ref 26.0–34.0)
MCHC: 33.4 g/dL (ref 30.0–36.0)
MCV: 91 fL (ref 80.0–100.0)
Monocytes Absolute: 0.9 10*3/uL (ref 0.1–1.0)
Monocytes Relative: 8 %
Neutro Abs: 9.8 10*3/uL — ABNORMAL HIGH (ref 1.7–7.7)
Neutrophils Relative %: 84 %
Platelets: 127 10*3/uL — ABNORMAL LOW (ref 150–400)
RBC: 3.88 MIL/uL — ABNORMAL LOW (ref 4.22–5.81)
RDW: 15 % (ref 11.5–15.5)
WBC: 11.7 10*3/uL — ABNORMAL HIGH (ref 4.0–10.5)
nRBC: 0 % (ref 0.0–0.2)

## 2020-03-23 LAB — COMPREHENSIVE METABOLIC PANEL
ALT: 67 U/L — ABNORMAL HIGH (ref 0–44)
AST: 72 U/L — ABNORMAL HIGH (ref 15–41)
Albumin: 2.7 g/dL — ABNORMAL LOW (ref 3.5–5.0)
Alkaline Phosphatase: 144 U/L — ABNORMAL HIGH (ref 38–126)
Anion gap: 11 (ref 5–15)
BUN: 14 mg/dL (ref 6–20)
CO2: 24 mmol/L (ref 22–32)
Calcium: 8.7 mg/dL — ABNORMAL LOW (ref 8.9–10.3)
Chloride: 105 mmol/L (ref 98–111)
Creatinine, Ser: 0.99 mg/dL (ref 0.61–1.24)
GFR, Estimated: >60 mL/min (ref >60)
Glucose, Bld: 144 mg/dL — ABNORMAL HIGH (ref 70–99)
Potassium: 4.2 mmol/L (ref 3.5–5.1)
Sodium: 140 mmol/L (ref 135–145)
Total Bilirubin: 1.4 mg/dL — ABNORMAL HIGH (ref 0.3–1.2)
Total Protein: 6.8 g/dL (ref 6.5–8.1)

## 2020-03-23 MED ORDER — IBUPROFEN 400 MG PO TABS
400.0000 mg | ORAL_TABLET | Freq: Once | ORAL | Status: AC
  Administered 2020-03-23: 22:00:00 400 mg via ORAL
  Filled 2020-03-23: qty 1

## 2020-03-23 MED ORDER — IPRATROPIUM-ALBUTEROL 0.5-2.5 (3) MG/3ML IN SOLN
3.0000 mL | Freq: Two times a day (BID) | RESPIRATORY_TRACT | Status: DC
  Administered 2020-03-23: 3 mL via RESPIRATORY_TRACT
  Filled 2020-03-23: qty 3

## 2020-03-23 NOTE — Progress Notes (Signed)
Patient currently has yellow MEWS score due to temp and pulse. Patient has been previously yellow and MD was notified, will restart protocol and administer PRN meds. Patient is stable and no change in condition. Leonard [name]  03/22/20 2327  Assess: MEWS Score  Temp (!) 100.6 F (38.1 C) (treated with tylenol)  BP 123/77  Pulse Rate (!) 104  Resp 18  SpO2 92 %  Assess: MEWS Score  MEWS Temp 1  MEWS Systolic 0  MEWS Pulse 1  MEWS RR 0  MEWS LOC 0  MEWS Score 2  MEWS Score Color Yellow  Assess: if the MEWS score is Yellow or Red  Were vital signs taken at a resting state? Yes  Focused Assessment No change from prior assessment  Early Detection of Sepsis Score *See Row Information* Low  MEWS guidelines implemented *See Row Information* Yes  Treat  MEWS Interventions Administered prn meds/treatments

## 2020-03-23 NOTE — Progress Notes (Signed)
PROGRESS NOTE    Leonard [name]  YIR:[id number] DOB: June 27, 1960 DOA: 03/20/2020 PCP: Gaynelle Arabian, MD   Chief Complain:SoB,fever  Brief Narrative:  Patient is a 60 year old male with history of chronic pancreatitis, chronic back pain, history of opioid addiction, hyperlipidemia, depression who presents from home to [facility] with complaints of fever, diarrhea,, shortness of breath.  On presentation he was found to be hypoxic requiring 3 L of oxygen per minute.  He desaturated on room air.  He was mildly tachycardic.  COVID was initially suspected but COVID screening test has been negative.  He was febrile on presentation chest imaging did not show any PE or pneumonia or any other definitive source of hypoxia.  Lab work showed mild hypokalemia, AKI with creatinine of 1.4.  Baseline creatinine is normal.  His BNP is mildly elevated. Patient will  admitted for further management of AKI secondary to diarrhea , fever of unknown origin and management of hypoxia .    Assessment & Plan:   Principal Problem:   Acute respiratory failure with hypoxia (HCC) Active Problems:   Opiate dependence (HCC)   Hyperlipidemia   Anxiety and depression   Chronic pancreatitis (HCC)   Alcohol dependence in remission ([place])   AKI (acute kidney injury) ([place])   Acute hypoxic respiratory failure: COVID, influenza screen negative.  No history of COPD.    Complained of shortness of breath, cough on presentation needing supplemental oxygen.  Had bibasilar crackles on presentation Extensive studies done.  CT angio did not show any PE pneumonia or pleural effusion.  Follow-up chest x-ray done on 03/23/19 showed possible PNA on LLL.  Continue supplemental oxygen as needed, continue bronchodilators, cough medications.Today he is on room air.  He feels much better today Has mild elevated BNP but will avoid diuretics for now. Auscultation reveals bilateral basal crackles.We will check echo  AKI: Presented  with creatinine of 1.4.  Currently it has resolved with IV fluids.  IV fluids has been stopped.  Fever: Intermittently febrile.Last fever was in 100s yesterday evening  No definitive source of infection.  Does not have leukocytosis.  We will follow-up cultures, no growth till date.  Currently on ceftriaxone and doxycycline  Sepsis: Presented with fever, tachycardia, tachypnea.  Sepsis physiology has improved.  We will follow-up cultures, no growth till date  Chronic pancreatitis: Currently stable.  Denies any abdominal pain  Chronic alcohol abuse: Quit alcohol about 2 years ago.  Currently sober  Hypokalemia: Supplemented with potassium  Mildly elevated liver enzymes: Improving.Negative hepatitis panel  Nicotine dependence: Counseled for cessation  Thrombocytopenia: Mild.  Continue to monitor.  Could be associated  with sepsis or chronic alcohol abuse           DVT prophylaxis:Lovenox Code Status: Full Family Communication: wife on phone Status is: Inpatient   Dispo: The patient is from: Home              Anticipated d/c is to: Home              Anticipated d/c date is: tomorrow              Patient currently is not medically stable to d/c.    Consultants: None  Procedures:None  Antimicrobials:  Anti-infectives (From admission, onward)   Start     Dose/Rate Route Frequency Ordered Stop   03/22/20 2230  vancomycin (VANCOREADY) IVPB 1250 mg/250 mL  Status:  Discontinued       "Followed by" Linked Group Details  1,250 mg 166.7 mL/hr over 90 Minutes Intravenous Every 12 hours 03/22/20 0935 03/22/20 1359   03/22/20 1800  cefTRIAXone (ROCEPHIN) 2 g in sodium chloride 0.9 % 100 mL IVPB        2 g 200 mL/hr over 30 Minutes Intravenous Every 24 hours 03/22/20 1359     03/22/20 1245  doxycycline (VIBRA-TABS) tablet 100 mg        100 mg Oral Every 12 hours 03/22/20 1153     03/22/20 1030  vancomycin (VANCOREADY) IVPB 2000 mg/400 mL       "Followed by" Linked Group Details    2,000 mg 200 mL/hr over 120 Minutes Intravenous  Once 03/22/20 0935 03/22/20 1238   03/22/20 1030  ceFEPIme (MAXIPIME) 2 g in sodium chloride 0.9 % 100 mL IVPB  Status:  Discontinued        2 g 200 mL/hr over 30 Minutes Intravenous Every 8 hours 03/22/20 0935 03/22/20 1359      Subjective: Patient seen and examined at bedside this morning.  Hemodynamically stable.  Feels much better today.  On room air at rest.  AFebrile this morning  Objective: Vitals:   03/22/20 2146 03/22/20 2327 03/23/20 0205 03/23/20 0531  BP: (!) 144/74 123/77 130/87 126/85  Pulse: (!) 102 (!) 104 83 90  Resp: 18 18 18 18   Temp: 100.2 F (37.9 C) (!) 100.6 F (38.1 C) 98 F (36.7 C) 97.8 F (36.6 C)  TempSrc: Oral Oral Oral Oral  SpO2: 90% 92% 90% 90%  Weight:      Height:        Intake/Output Summary (Last 24 hours) at 03/23/2020 0736 Last data filed at 03/23/2020 0600 Gross per 24 hour  Intake 2799.79 ml  Output 0 ml  Net 2799.79 ml   Filed Weights   03/20/20 1021 03/21/20 1821  Weight: 81.6 kg 84.1 kg    Examination:  General exam: Appears calm and comfortable ,Not in distress,average built HEENT:PERRL,Oral mucosa moist, Ear/Nose normal on gross exam Respiratory system: Bilateral basal crackles  Cardiovascular system: S1 & S2 heard, RRR. No JVD, murmurs, rubs, gallops or clicks. Gastrointestinal system: Abdomen is nondistended, soft and nontender. No organomegaly or masses felt. Normal bowel sounds heard. Central nervous system: Alert and oriented. No focal neurological deficits. Extremities: No edema, no clubbing ,no cyanosis Skin: No rashes, lesions or ulcers,no icterus ,no pallor   Data Reviewed: I have personally reviewed following labs and imaging studies  CBC: Recent Labs  Lab 03/20/20 1240 03/21/20 0838 03/22/20 0616 03/23/20 0539  WBC 9.4 9.8 7.4 11.7*  NEUTROABS 8.3* 7.6  --  9.8*  HGB 13.9 12.7* 12.6* 11.8*  HCT 41.7 37.6* 38.0* 35.3*  MCV 91.0 91.3 92.2 91.0  PLT  146* 141* 133* [id number]   Basic Metabolic Panel: Recent Labs  Lab 03/20/20 1240 03/21/20 0838 03/22/20 0616 03/23/20 0539  NA 136 139 141 140  K 3.4* 3.2* 3.8 4.2  CL 99 103 105 105  CO2 24 24 26 24   GLUCOSE 143* 137* 147* 144*  BUN 33* 23* 19 14  CREATININE 1.45* 1.24 0.84 0.99  CALCIUM 9.1 8.6* 8.8* 8.7*   GFR: Estimated Creatinine Clearance: 81.9 mL/min (by C-G formula based on SCr of 0.99 mg/dL). Liver Function Tests: Recent Labs  Lab 03/20/20 1240 03/22/20 0616 03/23/20 0539  AST 37 85* 72*  ALT 45* 69* 67*  ALKPHOS 143* 169* 144*  BILITOT 0.6 1.1 1.4*  PROT 7.3 7.1 6.8  ALBUMIN 2.9* 2.8* 2.7*  Recent Labs  Lab 03/20/20 1622  LIPASE 33   No results for input(s): AMMONIA in the last 168 hours. Coagulation Profile: No results for input(s): INR, PROTIME in the last 168 hours. Cardiac Enzymes: No results for input(s): CKTOTAL, CKMB, CKMBINDEX, TROPONINI in the last 168 hours. BNP (last 3 results) No results for input(s): PROBNP in the last 8760 hours. HbA1C: No results for input(s): HGBA1C in the last 72 hours. CBG: No results for input(s): GLUCAP in the last 168 hours. Lipid Profile: No results for input(s): CHOL, HDL, LDLCALC, TRIG, CHOLHDL, LDLDIRECT in the last 72 hours. Thyroid Function Tests: No results for input(s): TSH, T4TOTAL, FREET4, T3FREE, THYROIDAB in the last 72 hours. Anemia Panel: No results for input(s): VITAMINB12, FOLATE, FERRITIN, TIBC, IRON, RETICCTPCT in the last 72 hours. Sepsis Labs: Recent Labs  Lab 03/20/20 1417  LATICACIDVEN 1.1    Recent Results (from the past 240 hour(s))  Resp Panel by RT-PCR (Flu A&B, Covid) Nasopharyngeal Swab     Status: None   Collection Time: 03/20/20  1:05 PM   Specimen: Nasopharyngeal Swab; Nasopharyngeal(NP) swabs in vial transport medium  Result Value Ref Range Status   SARS Coronavirus 2 by RT PCR NEGATIVE NEGATIVE Final    Comment: (NOTE) SARS-CoV-2 target nucleic acids are NOT  DETECTED.  The SARS-CoV-2 RNA is generally detectable in upper respiratory specimens during the acute phase of infection. The lowest concentration of SARS-CoV-2 viral copies this assay can detect is 138 copies/mL. A negative result does not preclude SARS-Cov-2 infection and should not be used as the sole basis for treatment or other patient management decisions. A negative result may occur with  improper specimen collection/handling, submission of specimen other than nasopharyngeal swab, presence of viral mutation(s) within the areas targeted by this assay, and inadequate number of viral copies(<138 copies/mL). A negative result must be combined with clinical observations, patient history, and epidemiological information. The expected result is Negative.  Fact Sheet for Patients:  EntrepreneurPulse.com.au  Fact Sheet for Healthcare Providers:  IncredibleEmployment.be  This test is no t yet approved or cleared by the [place] FDA and  has been authorized for detection and/or diagnosis of SARS-CoV-2 by FDA under an Emergency Use Authorization (EUA). This EUA will remain  in effect (meaning this test can be used) for the duration of the COVID-19 declaration under Section 564(b)(1) of the Act, 21 U.S.C.section 360bbb-3(b)(1), unless the authorization is terminated  or revoked sooner.       Influenza A by PCR NEGATIVE NEGATIVE Final   Influenza B by PCR NEGATIVE NEGATIVE Final    Comment: (NOTE) The Xpert Xpress SARS-CoV-2/FLU/RSV plus assay is intended as an aid in the diagnosis of influenza from Nasopharyngeal swab specimens and should not be used as a sole basis for treatment. Nasal washings and aspirates are unacceptable for Xpert Xpress SARS-CoV-2/FLU/RSV testing.  Fact Sheet for Patients: EntrepreneurPulse.com.au  Fact Sheet for Healthcare Providers: IncredibleEmployment.be  This test is not yet  approved or cleared by the [place] FDA and has been authorized for detection and/or diagnosis of SARS-CoV-2 by FDA under an Emergency Use Authorization (EUA). This EUA will remain in effect (meaning this test can be used) for the duration of the COVID-19 declaration under Section 564(b)(1) of the Act, 21 U.S.C. section 360bbb-3(b)(1), unless the authorization is terminated or revoked.  Performed at [facility], [address] [place], [place] [address]   Culture, blood (routine x 2)     Status: None (Preliminary result)  Collection Time: 03/20/20  2:30 PM   Specimen: Left Antecubital; Blood  Result Value Ref Range Status   Specimen Description   Final    LEFT ANTECUBITAL Performed at [facility], [address] [place], [place] [address]    Special Requests   Final    BOTTLES DRAWN AEROBIC AND ANAEROBIC Blood Culture adequate volume Performed at [facility], [address] [place], [place] [address]    Culture   Final    NO GROWTH 2 DAYS Performed at [facility], [place] [address] [place], [place] [address]    Report Status PENDING  Incomplete  Culture, blood (routine x 2)     Status: None (Preliminary result)   Collection Time: 03/20/20  2:55 PM   Specimen: Left Antecubital; Blood  Result Value Ref Range Status   Specimen Description   Final    LEFT ANTECUBITAL Performed at [facility], [address] [place], [place] [address]    Special Requests   Final    BOTTLES DRAWN AEROBIC AND ANAEROBIC Blood Culture adequate volume Performed at [facility], [address] [place], [place] [address]    Culture   Final    NO GROWTH 2 DAYS Performed at [facility], [place] [address], [place], [place] [address]    Report Status PENDING  Incomplete  SARS CORONAVIRUS 2 (TAT 6-24 HRS) Nasopharyngeal Nasopharyngeal Swab     Status: None   Collection Time: 03/20/20  5:17 PM   Specimen: Nasopharyngeal Swab   Result Value Ref Range Status   SARS Coronavirus 2 NEGATIVE NEGATIVE Final    Comment: (NOTE) SARS-CoV-2 target nucleic acids are NOT DETECTED.  The SARS-CoV-2 RNA is generally detectable in upper and lower respiratory specimens during the acute phase of infection. Negative results do not preclude SARS-CoV-2 infection, do not rule out co-infections with other pathogens, and should not be used as the sole basis for treatment or other patient management decisions. Negative results must be combined with clinical observations, patient history, and epidemiological information. The expected result is Negative.  Fact Sheet for Patients: SugarRoll.be  Fact Sheet for Healthcare Providers: https://www.woods-mathews.com/  This test is not yet approved or cleared by the [place] FDA and  has been authorized for detection and/or diagnosis of SARS-CoV-2 by FDA under an Emergency Use Authorization (EUA). This EUA will remain  in effect (meaning this test can be used) for the duration of the COVID-19 declaration under Se ction 564(b)(1) of the Act, 21 U.S.C. section 360bbb-3(b)(1), unless the authorization is terminated or revoked sooner.  Performed at [facility], [place] [address] [place], [place] [address]   MRSA PCR Screening     Status: None   Collection Time: 03/22/20  9:28 AM   Specimen: Nasal Mucosa; Nasopharyngeal  Result Value Ref Range Status   MRSA by PCR NEGATIVE NEGATIVE Final    Comment:        The GeneXpert MRSA Assay (FDA approved for NASAL specimens only), is one component of a comprehensive MRSA colonization surveillance program. It is not intended to diagnose MRSA infection nor to guide or monitor treatment for MRSA infections. Performed at [facility], [place] [address] [place], [place] [address]          Radiology Studies: CT HEAD WO CONTRAST  Result Date: 03/22/2020 CLINICAL DATA:   Headache, new or worsening. EXAM: CT HEAD WITHOUT CONTRAST TECHNIQUE: Contiguous axial  images were obtained from the base of the skull through the vertex without intravenous contrast. COMPARISON:  October 15, 2016. FINDINGS: Brain: No evidence of acute infarction, hemorrhage, hydrocephalus, extra-axial collection or mass lesion/mass effect. Similar appearance of the ventricular system in comparison to prior. Mild white matter hypoattenuation, likely related to chronic microvascular ischemic disease. Vascular: No hyperdense vessel identified. Mild calcific atherosclerosis. Skull: No acute fracture. Sinuses/Orbits: Clear visualized sinuses. Unremarkable visualized orbits. Other: No mastoid effusions. IMPRESSION: No evidence of acute intracranial abnormality. Electronically Signed   By: Margaretha Sheffield MD   On: 03/22/2020 11:26   DG CHEST PORT 1 VIEW  Result Date: 03/22/2020 CLINICAL DATA:  Fever, hypoxia, headache, nonproductive cough EXAM: PORTABLE CHEST 1 VIEW COMPARISON:  03/20/2020 chest radiograph. FINDINGS: Surgical hardware from ACDF partially visualized. Stable cardiomediastinal silhouette with normal heart size. No pneumothorax. No pleural effusion. Patchy left lung base opacity, similar. No pulmonary edema. IMPRESSION: Patchy left lung base opacity, similar, suspicious for left lower lobe pneumonia. Electronically Signed   By: Ilona Sorrel M.D.   On: 03/22/2020 10:41        Scheduled Meds: . doxycycline  100 mg Oral Q12H  . guaiFENesin-dextromethorphan  10 mL Oral Q6H  . ipratropium-albuterol  3 mL Nebulization Q6H WA  . pregabalin  150 mg Oral BID  . sertraline  75 mg Oral BID  . traZODone  150 mg Oral QHS   Continuous Infusions: . cefTRIAXone (ROCEPHIN)  IV 2 g (03/22/20 1659)     LOS: 1 day    Time spent: 25 mins.More than 50% of that time was spent in counseling and/or coordination of care.      Shelly Coss, MD [facility] P1/16/2022, 7:36 AM

## 2020-03-24 ENCOUNTER — Other Ambulatory Visit (HOSPITAL_COMMUNITY): Payer: Managed Care, Other (non HMO)

## 2020-03-24 DIAGNOSIS — J9601 Acute respiratory failure with hypoxia: Secondary | ICD-10-CM | POA: Diagnosis not present

## 2020-03-24 LAB — CBC WITH DIFFERENTIAL/PLATELET
Abs Immature Granulocytes: 0.09 10*3/uL — ABNORMAL HIGH (ref 0.00–0.07)
Basophils Absolute: 0 10*3/uL (ref 0.0–0.1)
Basophils Relative: 0 %
Eosinophils Absolute: 0.1 10*3/uL (ref 0.0–0.5)
Eosinophils Relative: 1 %
HCT: 33 % — ABNORMAL LOW (ref 39.0–52.0)
Hemoglobin: 11.1 g/dL — ABNORMAL LOW (ref 13.0–17.0)
Immature Granulocytes: 1 %
Lymphocytes Relative: 10 %
Lymphs Abs: 0.9 10*3/uL (ref 0.7–4.0)
MCH: 30.7 pg (ref 26.0–34.0)
MCHC: 33.6 g/dL (ref 30.0–36.0)
MCV: 91.2 fL (ref 80.0–100.0)
Monocytes Absolute: 0.6 10*3/uL (ref 0.1–1.0)
Monocytes Relative: 7 %
Neutro Abs: 7.1 10*3/uL (ref 1.7–7.7)
Neutrophils Relative %: 81 %
Platelets: 147 10*3/uL — ABNORMAL LOW (ref 150–400)
RBC: 3.62 MIL/uL — ABNORMAL LOW (ref 4.22–5.81)
RDW: 15.1 % (ref 11.5–15.5)
WBC: 8.8 10*3/uL (ref 4.0–10.5)
nRBC: 0 % (ref 0.0–0.2)

## 2020-03-24 MED ORDER — IBUPROFEN 400 MG PO TABS
400.0000 mg | ORAL_TABLET | Freq: Once | ORAL | Status: AC
  Administered 2020-03-24: 400 mg via ORAL
  Filled 2020-03-24: qty 1

## 2020-03-24 MED ORDER — CEFDINIR 300 MG PO CAPS: 300.0000 mg | ORAL_CAPSULE | Freq: Two times a day (BID) | ORAL | 0 refills | Status: AC

## 2020-03-24 MED ORDER — GUAIFENESIN-DM 100-10 MG/5ML PO SYRP: 10.0000 mL | ORAL_SOLUTION | Freq: Four times a day (QID) | ORAL | 0 refills | Status: DC | PRN

## 2020-03-24 MED ORDER — DOXYCYCLINE MONOHYDRATE 100 MG PO TABS: 100.0000 mg | ORAL_TABLET | Freq: Two times a day (BID) | ORAL | 0 refills | Status: AC

## 2020-03-24 NOTE — Discharge Summary (Signed)
Physician Discharge Summary  Leonard [name] [id number] DOB: 03-28-1960 DOA: 03/20/2020  PCP: Gaynelle Arabian, MD  Admit date: 03/20/2020 Discharge date: 03/24/2020  Admitted From: Home Disposition:  Home  Discharge Condition:Stable CODE STATUS:FULL Diet recommendation: Regular  Brief/Interim Summary: Patient is a 60 year old male with history of chronic pancreatitis, chronic back pain,hyperlipidemia, depression who presents from home to [place] with complaints of fever, diarrhea,, shortness of breath. On presentation he was found to be hypoxic requiring 3 L of oxygen per minute. He desaturated on room air. He was mildly tachycardic. COVID was initially suspected but COVID screening test has been negative. He was febrile on presentation chest imaging did not show any PE or pneumonia or any other definitive source of hypoxia. Lab work showed mild hypokalemia, AKI with creatinine of 1.4. Baseline creatinine is normal. His BNP was mildly elevated. Patient was  admitted forfurther management of AKI secondary to diarrhea , fever of unknown origin and management of hypoxia .  Follow-up chest x-ray showed possible left lower lobe pneumonia.  Overall status is improved.  Fever, hypoxia has resolved.  He is hemodynamically stable for discharge home today.  Following problems were addressed during his hospitalization:  Acute hypoxic respiratory failure: COVID, influenza screen negative.  No history of COPD.    Complained of shortness of breath, cough on presentation needing supplemental oxygen.  Had bibasilar crackles on presentation Extensive studies done.  CT angio did not show any PE pneumonia or pleural effusion.  Follow-up chest x-ray done on 03/23/19 showed possible PNA on LLL.  Continue supplemental oxygen as needed, continue bronchodilators, cough medications.Today he is on room air.  He feels much better today.  We will change antibiotics to oral on discharge.  AKI:  Presented with creatinine of 1.4.  Currently it has resolved with IV fluids.  IV fluids has been stopped.  Fever: He was Intermittently febrile.No fever for almost 48 hours. No definitive source of infection.  Does not have leukocytosis.  Cultures have been negative.  We will change antibiotics to oral.  Sepsis: Presented with fever, tachycardia, tachypnea.  Sepsis physiology has improved.  Cultures havent shown  growth till date  Chronic pancreatitis: Currently stable.  Denies any abdominal pain  Chronic alcohol abuse: Quit alcohol about 2 years ago.  Currently sober  Hypokalemia: Supplemented with potassium  Mildly elevated liver enzymes: Improving.Negative hepatitis panel  Nicotine dependence: Counseled for cessation  Thrombocytopenia: Mild.  Continue to monitor.  Could be associated  with sepsis or chronic alcohol abuse.  Monitor as an outpatient   Discharge Diagnoses:  Principal Problem:   Acute respiratory failure with hypoxia ([place]) Active Problems:   Opiate dependence (HCC)   Hyperlipidemia   Anxiety and depression   Chronic pancreatitis (HCC)   Alcohol dependence in remission ([place])   AKI (acute kidney injury) [facility])    Discharge Instructions  Discharge Instructions    Diet - low sodium heart healthy   Complete by: As directed    Discharge instructions   Complete by: As directed    1)Please follow up with your PCP in a week 2)Take prescribed medications as instructed   Increase activity slowly   Complete by: As directed      Allergies as of 03/24/2020      Reactions   Duloxetine    Other reaction(s): Other (See Comments) Suicidal thoughts   Sulfa Antibiotics Swelling, Rash, Other (See Comments)   Face swells and this class of meds caused welts, also   Zocor [  simvastatin] Other (See Comments)   Elevated his liver enzymes      Medication List    TAKE these medications   acetaminophen 325 MG tablet Commonly known as: TYLENOL Take 2 tablets (650 mg  total) by mouth every 6 (six) hours as needed for mild pain. What changed:   how much to take  when to take this   aspirin 81 MG EC tablet Take 81 mg by mouth daily. Swallow whole.   B-12 5000 MCG Subl Place 5,000 mcg under the tongue daily.   cefdinir 300 MG capsule Commonly known as: OMNICEF Take 1 capsule (300 mg total) by mouth 2 (two) times daily for 4 days.   docusate sodium 100 MG capsule Commonly known as: COLACE Take 100 mg by mouth daily as needed for mild constipation.   doxycycline 100 MG tablet Commonly known as: ADOXA Take 1 tablet (100 mg total) by mouth 2 (two) times daily for 4 days.   fluticasone 50 MCG/ACT nasal spray Commonly known as: FLONASE Place 1-2 sprays into both nostrils daily as needed for allergies or rhinitis.   guaiFENesin-dextromethorphan 100-10 MG/5ML syrup Commonly known as: ROBITUSSIN DM Take 10 mLs by mouth every 6 (six) hours as needed for cough.   ibuprofen 200 MG tablet Commonly known as: ADVIL Take 600 mg by mouth daily as needed for mild pain.   lipase/protease/amylase 36000 UNITS Cpep capsule Commonly known as: CREON Take 1-2 capsules (36,000-72,000 Units total) by mouth 4 (four) times daily.   multivitamin with minerals Tabs tablet Take 1 tablet by mouth daily.   Naphcon-A 0.025-0.3 % ophthalmic solution Generic drug: naphazoline-pheniramine Place 1 drop into both eyes 2 (two) times daily as needed for eye irritation or allergies (for seasonal allergies).   ondansetron 4 MG tablet Commonly known as: ZOFRAN Take 1 tablet (4 mg total) by mouth every 6 (six) hours as needed for nausea.   polyethylene glycol powder 17 GM/SCOOP powder Commonly known as: GLYCOLAX/MIRALAX Take 1 Container by mouth daily as needed for mild constipation.   pregabalin 75 MG capsule Commonly known as: LYRICA Take 150 mg by mouth 2 (two) times daily.   sertraline 50 MG tablet Commonly known as: ZOLOFT Take 75 mg by mouth 2 (two) times  daily.   sildenafil 100 MG tablet Commonly known as: VIAGRA Take 50-100 mg by mouth daily as needed for erectile dysfunction.   traZODone 150 MG tablet Commonly known as: DESYREL Take 150 mg by mouth at bedtime.   Vitamin D3 50 MCG (2000 UT) Tabs Take 2,000 Units by mouth daily.       Follow-up Information    Gaynelle Arabian, MD. Schedule an appointment as soon as possible for a visit in 1 week(s).   Specialty: Family Medicine Contact information: [address], [address] [place] [address] [phone]              Allergies  Allergen Reactions  . Duloxetine     Other reaction(s): Other (See Comments) Suicidal thoughts  . Sulfa Antibiotics Swelling, Rash and Other (See Comments)    Face swells and this class of meds caused welts, also  . Zocor [Simvastatin] Other (See Comments)    Elevated his liver enzymes    Consultations: None  Procedures/Studies: CT ABDOMEN PELVIS WO CONTRAST  Result Date: 03/20/2020 CLINICAL DATA:  Sepsis.  Hepatobiliary stent. EXAM: CT ABDOMEN AND PELVIS WITHOUT CONTRAST TECHNIQUE: Multidetector CT imaging of the abdomen and pelvis was performed following the standard protocol without IV contrast.  COMPARISON:  Chest CT earlier today. MRI 10/22/2019. CT 05/15/2019. FINDINGS: Lower chest: Volume loss in the left lower lobe that could be simple atelectasis or atelectatic pneumonia. Right lung base is clear. Hepatobiliary: Fatty liver. Some areas of more prominent focal fat adjacent to the falciform ligament and gallbladder fossa. At the caudal tip of the right lobe, there is a newly seen region of low-density measuring about 3.7 cm in size, not discretely marginated. There did not seem to be any abnormality in this region on the previous CT or the MRI of 5 months ago. Differential diagnosis includes neoplastic mass lesion, focal fat, or infectious hepatitis. No calcified gallstones. Pancreas: Pancreatic ductal stent remains in place,  extending from the mid body through the ampullary into the duodenal C loop. Chronic pancreatic calcifications as seen previously. 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 No new or acute finding. Spleen: Normal Adrenals/Urinary Tract: Adrenal glands are normal. Kidneys are normal. Bladder is normal. Urinary tract contrast related to previous CT of the chest. Stomach/Bowel: No sign of bowel obstruction. No sign of bowel inflammatory disease. Mild sigmoid diverticulosis without evidence of diverticulitis per Vascular/Lymphatic: Aortic atherosclerosis. No aneurysm. Common iliac artery aneurysms, 2.2 cm on the right and 2.4 cm on the left. Reproductive: Normal Other: No free fluid or air. Musculoskeletal: Previous discectomy and fusion procedures L3 through L5. IMPRESSION: 1. Pancreatic ductal stent remains in place, extending from the mid body through the ampullary into the duodenal C loop. 2. Chronic pancreatic calcifications as seen previously. 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 3. Fatty liver. Approximately 3-4 cm region of newly seen low-density at the caudal tip of the right lobe. This could be a mass, focal fat or infectious hepatitis. Small areas of low-density adjacent to the porta hepatis most likely focal fat. 4. Volume loss in the left lower lobe that could be simple atelectasis or atelectatic pneumonia. 5. Aortic atherosclerosis. Common iliac artery aneurysms, 2.2 cm on the right and 2.4 cm on the left. Aortic Atherosclerosis (ICD10-I70.0). Electronically Signed   By: Nelson Chimes M.D.   On: 03/20/2020 17:55   CT HEAD WO CONTRAST  Result Date: 03/22/2020 CLINICAL DATA:  Headache, new or worsening. EXAM: CT HEAD WITHOUT CONTRAST TECHNIQUE: Contiguous axial images were obtained from the base of the skull through the vertex without intravenous contrast. COMPARISON:  October 15, 2016. FINDINGS:  Brain: No evidence of acute infarction, hemorrhage, hydrocephalus, extra-axial collection or mass lesion/mass effect. Similar appearance of the ventricular system in comparison to prior. Mild white matter hypoattenuation, likely related to chronic microvascular ischemic disease. Vascular: No hyperdense vessel identified. Mild calcific atherosclerosis. Skull: No acute fracture. Sinuses/Orbits: Clear visualized sinuses. Unremarkable visualized orbits. Other: No mastoid effusions. IMPRESSION: No evidence of acute intracranial abnormality. Electronically Signed   By: Margaretha Sheffield MD   On: 03/22/2020 11:26   CT Angio Chest PE W/Cm &/Or Wo Cm  Result Date: 03/20/2020 CLINICAL DATA:  Fever, high probability of pulmonary embolus. EXAM: CT ANGIOGRAPHY CHEST WITH CONTRAST TECHNIQUE: Multidetector CT imaging of the chest was performed using the standard protocol during bolus administration of intravenous contrast. Multiplanar CT image reconstructions and MIPs were obtained to evaluate the vascular anatomy. CONTRAST:  126mL OMNIPAQUE IOHEXOL 350 MG/ML SOLN COMPARISON:  None. FINDINGS: Cardiovascular: Satisfactory opacification of the pulmonary arteries to the segmental level. No evidence of pulmonary embolism. Normal heart size. No pericardial effusion. Mediastinum/Nodes: No enlarged mediastinal, hilar,  or axillary lymph nodes. Thyroid gland, trachea, and esophagus demonstrate no significant findings. Lungs/Pleura: No pneumothorax or pleural effusion is noted. Right lung is clear. Mild left basilar subsegmental atelectasis is noted. Upper Abdomen: No acute abnormality. Musculoskeletal: No chest wall abnormality. No acute or significant osseous findings. Review of the MIP images confirms the above findings. IMPRESSION: 1. No definite evidence of pulmonary embolus. 2. Mild left basilar subsegmental atelectasis. Electronically Signed   By: Marijo Conception M.D.   On: 03/20/2020 15:21   DG CHEST PORT 1 VIEW  Result  Date: 03/22/2020 CLINICAL DATA:  Fever, hypoxia, headache, nonproductive cough EXAM: PORTABLE CHEST 1 VIEW COMPARISON:  03/20/2020 chest radiograph. FINDINGS: Surgical hardware from ACDF partially visualized. Stable cardiomediastinal silhouette with normal heart size. No pneumothorax. No pleural effusion. Patchy left lung base opacity, similar. No pulmonary edema. IMPRESSION: Patchy left lung base opacity, similar, suspicious for left lower lobe pneumonia. Electronically Signed   By: Ilona Sorrel M.D.   On: 03/22/2020 10:41   DG Chest Port 1 View  Result Date: 03/20/2020 CLINICAL DATA:  Cough and fever.  Shortness of breath. EXAM: PORTABLE CHEST 1 VIEW COMPARISON:  None. FINDINGS: The heart size and mediastinal contours are within normal limits. Low lung volumes are noted. Linear opacities in both lung bases may be due to atelectasis or scarring. No evidence of pulmonary consolidation or pleural effusion. IMPRESSION: Low lung volumes with bibasilar atelectasis versus scarring. Electronically Signed   By: Marlaine Hind M.D.   On: 03/20/2020 12:53       Subjective: Patient seen and examined at the bedside this morning.  Hemodynamically stable for discharge.  Discharge Exam: Vitals:   03/23/20 2153 03/24/20 0540  BP: (!) 139/91 (!) 127/91  Pulse: 89 73  Resp: 18 18  Temp: 98.3 F (36.8 C) 97.8 F (36.6 C)  SpO2: 93% 93%   Vitals:   03/23/20 0923 03/23/20 1355 03/23/20 2153 03/24/20 0540  BP: 131/86 (!) 123/93 (!) 139/91 (!) 127/91  Pulse: 86 80 89 73  Resp: 17 17 18 18   Temp: 97.8 F (36.6 C) 97.9 F (36.6 C) 98.3 F (36.8 C) 97.8 F (36.6 C)  TempSrc: Oral Oral Oral Oral  SpO2: 95% 93% 93% 93%  Weight:      Height:        General: Pt is alert, awake, not in acute distress Cardiovascular: RRR, S1/S2 +, no rubs, no gallops Respiratory: CTA bilaterally, no wheezing, no rhonchi Abdominal: Soft, NT, ND, bowel sounds + Extremities: no edema, no cyanosis    The results of  significant diagnostics from this hospitalization (including imaging, microbiology, ancillary and laboratory) are listed below for reference.     Microbiology: Recent Results (from the past 240 hour(s))  Resp Panel by RT-PCR (Flu A&B, Covid) Nasopharyngeal Swab     Status: None   Collection Time: 03/20/20  1:05 PM   Specimen: Nasopharyngeal Swab; Nasopharyngeal(NP) swabs in vial transport medium  Result Value Ref Range Status   SARS Coronavirus 2 by RT PCR NEGATIVE NEGATIVE Final    Comment: (NOTE) SARS-CoV-2 target nucleic acids are NOT DETECTED.  The SARS-CoV-2 RNA is generally detectable in upper respiratory specimens during the acute phase of infection. The lowest concentration of SARS-CoV-2 viral copies this assay can detect is 138 copies/mL. A negative result does not preclude SARS-Cov-2 infection and should not be used as the sole basis for treatment or other patient management decisions. A negative result may occur with  improper specimen collection/handling, submission of  specimen other than nasopharyngeal swab, presence of viral mutation(s) within the areas targeted by this assay, and inadequate number of viral copies(<138 copies/mL). A negative result must be combined with clinical observations, patient history, and epidemiological information. The expected result is Negative.  Fact Sheet for Patients:  BloggerCourse.comhttps://www.fda.gov/media/152166/download  Fact Sheet for Healthcare Providers:  SeriousBroker.ithttps://www.fda.gov/media/152162/download  This test is no t yet approved or cleared by the [place] FDA and  has been authorized for detection and/or diagnosis of SARS-CoV-2 by FDA under an Emergency Use Authorization (EUA). This EUA will remain  in effect (meaning this test can be used) for the duration of the COVID-19 declaration under Section 564(b)(1) of the Act, 21 U.S.C.section 360bbb-3(b)(1), unless the authorization is terminated  or revoked sooner.       Influenza A by  PCR NEGATIVE NEGATIVE Final   Influenza B by PCR NEGATIVE NEGATIVE Final    Comment: (NOTE) The Xpert Xpress SARS-CoV-2/FLU/RSV plus assay is intended as an aid in the diagnosis of influenza from Nasopharyngeal swab specimens and should not be used as a sole basis for treatment. Nasal washings and aspirates are unacceptable for Xpert Xpress SARS-CoV-2/FLU/RSV testing.  Fact Sheet for Patients: BloggerCourse.comhttps://www.fda.gov/media/152166/download  Fact Sheet for Healthcare Providers: SeriousBroker.ithttps://www.fda.gov/media/152162/download  This test is not yet approved or cleared by the [place] FDA and has been authorized for detection and/or diagnosis of SARS-CoV-2 by FDA under an Emergency Use Authorization (EUA). This EUA will remain in effect (meaning this test can be used) for the duration of the COVID-19 declaration under Section 564(b)(1) of the Act, 21 U.S.C. section 360bbb-3(b)(1), unless the authorization is terminated or revoked.  Performed at [facility] [place], [address] [place], [place] [address]   Culture, blood (routine x 2)     Status: None (Preliminary result)   Collection Time: 03/20/20  2:30 PM   Specimen: Left Antecubital; Blood  Result Value Ref Range Status   Specimen Description   Final    LEFT ANTECUBITAL Performed at [facility] [place], [address] [place], [place] [address]    Special Requests   Final    BOTTLES DRAWN AEROBIC AND ANAEROBIC Blood Culture adequate volume Performed at [facility] [place], [address] [place], [place] [address]    Culture   Final    NO GROWTH 3 DAYS Performed at [facility], [address] [place], [place] [address]    Report Status PENDING  Incomplete  Culture, blood (routine x 2)     Status: None (Preliminary result)   Collection Time: 03/20/20  2:55 PM   Specimen: Left Antecubital; Blood  Result Value Ref Range Status   Specimen Description   Final    LEFT ANTECUBITAL Performed at [facility]  [place], [address] [place], [place] [address]    Special Requests   Final    BOTTLES DRAWN AEROBIC AND ANAEROBIC Blood Culture adequate volume Performed at [facility] [place], [address] [place], [place] [address]    Culture   Final    NO GROWTH 3 DAYS Performed at [facility], [address] [place], [place] [address]    Report Status PENDING  Incomplete  SARS CORONAVIRUS 2 (TAT 6-24 HRS) Nasopharyngeal Nasopharyngeal Swab     Status: None   Collection Time: 03/20/20  5:17 PM   Specimen: Nasopharyngeal Swab  Result Value Ref Range Status   SARS Coronavirus 2 NEGATIVE NEGATIVE Final  Comment: (NOTE) SARS-CoV-2 target nucleic acids are NOT DETECTED.  The SARS-CoV-2 RNA is generally detectable in upper and lower respiratory specimens during the acute phase of infection. Negative results do not preclude SARS-CoV-2 infection, do not rule out co-infections with other pathogens, and should not be used as the sole basis for treatment or other patient management decisions. Negative results must be combined with clinical observations, patient history, and epidemiological information. The expected result is Negative.  Fact Sheet for Patients: SugarRoll.be  Fact Sheet for Healthcare Providers: https://www.woods-mathews.com/  This test is not yet approved or cleared by the [place] FDA and  has been authorized for detection and/or diagnosis of SARS-CoV-2 by FDA under an Emergency Use Authorization (EUA). This EUA will remain  in effect (meaning this test can be used) for the duration of the COVID-19 declaration under Se ction 564(b)(1) of the Act, 21 U.S.C. section 360bbb-3(b)(1), unless the authorization is terminated or revoked sooner.  Performed at [facility], [place] [address] [place], [place] [address]   MRSA PCR Screening     Status: None   Collection Time: 03/22/20  9:28 AM   Specimen: Nasal  Mucosa; Nasopharyngeal  Result Value Ref Range Status   MRSA by PCR NEGATIVE NEGATIVE Final    Comment:        The GeneXpert MRSA Assay (FDA approved for NASAL specimens only), is one component of a comprehensive MRSA colonization surveillance program. It is not intended to diagnose MRSA infection nor to guide or monitor treatment for MRSA infections. Performed at [facility], [place] [address] [place],  [address]      Labs: BNP (last 3 results) Recent Labs    03/20/20 1417 03/22/20 0616  BNP 265.0* 440.3*   Basic Metabolic Panel: Recent Labs  Lab 03/20/20 1240 03/21/20 0838 03/22/20 0616 03/23/20 0539  NA 136 139 141 140  K 3.4* 3.2* 3.8 4.2  CL 99 103 105 105  CO2 24 24 26 24   GLUCOSE 143* 137* 147* 144*  BUN 33* 23* 19 14  CREATININE 1.45* 1.24 0.84 0.99  CALCIUM 9.1 8.6* 8.8* 8.7*   Liver Function Tests: Recent Labs  Lab 03/20/20 1240 03/22/20 0616 03/23/20 0539  AST 37 85* 72*  ALT 45* 69* 67*  ALKPHOS 143* 169* 144*  BILITOT 0.6 1.1 1.4*  PROT 7.3 7.1 6.8  ALBUMIN 2.9* 2.8* 2.7*   Recent Labs  Lab 03/20/20 1622  LIPASE 33   No results for input(s): AMMONIA in the last 168 hours. CBC: Recent Labs  Lab 03/20/20 1240 03/21/20 0838 03/22/20 0616 03/23/20 0539 03/24/20 0543  WBC 9.4 9.8 7.4 11.7* 8.8  NEUTROABS 8.3* 7.6  --  9.8* 7.1  HGB 13.9 12.7* 12.6* 11.8* 11.1*  HCT 41.7 37.6* 38.0* 35.3* 33.0*  MCV 91.0 91.3 92.2 91.0 91.2  PLT 146* 141* 133* 127* 147*   Cardiac Enzymes: No results for input(s): CKTOTAL, CKMB, CKMBINDEX, TROPONINI in the last 168 hours. BNP: Invalid input(s): POCBNP CBG: No results for input(s): GLUCAP in the last 168 hours. D-Dimer No results for input(s): DDIMER in the last 72 hours. Hgb A1c No results for input(s): HGBA1C in the last 72 hours. Lipid Profile No results for input(s): CHOL, HDL, LDLCALC, TRIG, CHOLHDL, LDLDIRECT in the last 72 hours. Thyroid function studies No  results for input(s): TSH, T4TOTAL, T3FREE, THYROIDAB in the last 72 hours.  Invalid input(s): FREET3 Anemia work up No results for input(s): VITAMINB12, FOLATE, FERRITIN, TIBC, IRON, RETICCTPCT in the  last 72 hours. Urinalysis    Component Value Date/Time   COLORURINE YELLOW 03/20/2020 1622   APPEARANCEUR CLEAR 03/20/2020 1622   LABSPEC 1.010 03/20/2020 1622   PHURINE 6.0 03/20/2020 1622   GLUCOSEU NEGATIVE 03/20/2020 1622   HGBUR NEGATIVE 03/20/2020 1622   BILIRUBINUR NEGATIVE 03/20/2020 1622   KETONESUR NEGATIVE 03/20/2020 1622   PROTEINUR 30 (A) 03/20/2020 1622   NITRITE NEGATIVE 03/20/2020 1622   LEUKOCYTESUR NEGATIVE 03/20/2020 1622   Sepsis Labs Invalid input(s): PROCALCITONIN,  WBC,  LACTICIDVEN Microbiology Recent Results (from the past 240 hour(s))  Resp Panel by RT-PCR (Flu A&B, Covid) Nasopharyngeal Swab     Status: None   Collection Time: 03/20/20  1:05 PM   Specimen: Nasopharyngeal Swab; Nasopharyngeal(NP) swabs in vial transport medium  Result Value Ref Range Status   SARS Coronavirus 2 by RT PCR NEGATIVE NEGATIVE Final    Comment: (NOTE) SARS-CoV-2 target nucleic acids are NOT DETECTED.  The SARS-CoV-2 RNA is generally detectable in upper respiratory specimens during the acute phase of infection. The lowest concentration of SARS-CoV-2 viral copies this assay can detect is 138 copies/mL. A negative result does not preclude SARS-Cov-2 infection and should not be used as the sole basis for treatment or other patient management decisions. A negative result may occur with  improper specimen collection/handling, submission of specimen other than nasopharyngeal swab, presence of viral mutation(s) within the areas targeted by this assay, and inadequate number of viral copies(<138 copies/mL). A negative result must be combined with clinical observations, patient history, and epidemiological information. The expected result is Negative.  Fact Sheet for Patients:   EntrepreneurPulse.com.au  Fact Sheet for Healthcare Providers:  IncredibleEmployment.be  This test is no t yet approved or cleared by the [place] FDA and  has been authorized for detection and/or diagnosis of SARS-CoV-2 by FDA under an Emergency Use Authorization (EUA). This EUA will remain  in effect (meaning this test can be used) for the duration of the COVID-19 declaration under Section 564(b)(1) of the Act, 21 U.S.C.section 360bbb-3(b)(1), unless the authorization is terminated  or revoked sooner.       Influenza A by PCR NEGATIVE NEGATIVE Final   Influenza B by PCR NEGATIVE NEGATIVE Final    Comment: (NOTE) The Xpert Xpress SARS-CoV-2/FLU/RSV plus assay is intended as an aid in the diagnosis of influenza from Nasopharyngeal swab specimens and should not be used as a sole basis for treatment. Nasal washings and aspirates are unacceptable for Xpert Xpress SARS-CoV-2/FLU/RSV testing.  Fact Sheet for Patients: EntrepreneurPulse.com.au  Fact Sheet for Healthcare Providers: IncredibleEmployment.be  This test is not yet approved or cleared by the [place] FDA and has been authorized for detection and/or diagnosis of SARS-CoV-2 by FDA under an Emergency Use Authorization (EUA). This EUA will remain in effect (meaning this test can be used) for the duration of the COVID-19 declaration under Section 564(b)(1) of the Act, 21 U.S.C. section 360bbb-3(b)(1), unless the authorization is terminated or revoked.  Performed at [facility], [address] [place], [place] [address]   Culture, blood (routine x 2)     Status: None (Preliminary result)   Collection Time: 03/20/20  2:30 PM   Specimen: Left Antecubital; Blood  Result Value Ref Range Status   Specimen Description   Final    LEFT ANTECUBITAL Performed at [facility], [address] [place], [place] [address]     Special Requests   Final    BOTTLES DRAWN AEROBIC AND ANAEROBIC  Blood Culture adequate volume Performed at [facility], [address] [place], [place] [address]    Culture   Final    NO GROWTH 3 DAYS Performed at [facility], [place] [address] [place], [place] [address]    Report Status PENDING  Incomplete  Culture, blood (routine x 2)     Status: None (Preliminary result)   Collection Time: 03/20/20  2:55 PM   Specimen: Left Antecubital; Blood  Result Value Ref Range Status   Specimen Description   Final    LEFT ANTECUBITAL Performed at [facility], [address] [place], [place] [address]    Special Requests   Final    BOTTLES DRAWN AEROBIC AND ANAEROBIC Blood Culture adequate volume Performed at [facility], [address] [place], [place] [address]    Culture   Final    NO GROWTH 3 DAYS Performed at [facility], [place] [address] [place], [place] [address]    Report Status PENDING  Incomplete  SARS CORONAVIRUS 2 (TAT 6-24 HRS) Nasopharyngeal Nasopharyngeal Swab     Status: None   Collection Time: 03/20/20  5:17 PM   Specimen: Nasopharyngeal Swab  Result Value Ref Range Status   SARS Coronavirus 2 NEGATIVE NEGATIVE Final    Comment: (NOTE) SARS-CoV-2 target nucleic acids are NOT DETECTED.  The SARS-CoV-2 RNA is generally detectable in upper and lower respiratory specimens during the acute phase of infection. Negative results do not preclude SARS-CoV-2 infection, do not rule out co-infections with other pathogens, and should not be used as the sole basis for treatment or other patient management decisions. Negative results must be combined with clinical observations, patient history, and epidemiological information. The expected result is Negative.  Fact Sheet for Patients: SugarRoll.be  Fact Sheet for Healthcare Providers: https://www.woods-mathews.com/  This test is not yet  approved or cleared by the [place] FDA and  has been authorized for detection and/or diagnosis of SARS-CoV-2 by FDA under an Emergency Use Authorization (EUA). This EUA will remain  in effect (meaning this test can be used) for the duration of the COVID-19 declaration under Se ction 564(b)(1) of the Act, 21 U.S.C. section 360bbb-3(b)(1), unless the authorization is terminated or revoked sooner.  Performed at [facility], [place] [address] [place], [place] [address]   MRSA PCR Screening     Status: None   Collection Time: 03/22/20  9:28 AM   Specimen: Nasal Mucosa; Nasopharyngeal  Result Value Ref Range Status   MRSA by PCR NEGATIVE NEGATIVE Final    Comment:        The GeneXpert MRSA Assay (FDA approved for NASAL specimens only), is one component of a comprehensive MRSA colonization surveillance program. It is not intended to diagnose MRSA infection nor to guide or monitor treatment for MRSA infections. Performed at [facility], [place] [address], [place], [place] [address]     Please note: You were cared for by a hospitalist during your hospital stay. Once you are discharged, your primary care physician will handle any further medical issues. Please note that NO REFILLS for any discharge medications will be authorized once you are discharged, as it is imperative that you return to your primary care physician (or establish a relationship with a primary care physician if you do not have one) for your post hospital discharge needs so that they can reassess your need for medications and monitor your lab values.  Time coordinating discharge: 40 minutes  SIGNED:   Shelly Coss, MD  [facility] 03/24/2020, 10:28 AM Pager [phone]  If 7PM-7AM, please contact night-coverage [email or web address] Password [email or web address]

## 2020-03-24 NOTE — Plan of Care (Signed)
Instructions were reviewed with patient. All questions were answered. Patient was transported to main entrance by wheelchair. ° °

## 2020-03-25 LAB — CULTURE, BLOOD (ROUTINE X 2)
Culture: NO GROWTH
Culture: NO GROWTH
Special Requests: ADEQUATE
Special Requests: ADEQUATE

## 2020-04-04 ENCOUNTER — Other Ambulatory Visit: Payer: Self-pay | Admitting: Physician Assistant

## 2020-04-04 DIAGNOSIS — R932 Abnormal findings on diagnostic imaging of liver and biliary tract: Secondary | ICD-10-CM

## 2020-04-15 ENCOUNTER — Other Ambulatory Visit: Payer: Self-pay

## 2020-04-15 ENCOUNTER — Ambulatory Visit
Admission: RE | Admit: 2020-04-15 | Discharge: 2020-04-15 | Disposition: A | Discharge status: Home / Self Care | Payer: Managed Care, Other (non HMO) | Source: Ambulatory Visit | Attending: Physician Assistant | Admitting: Physician Assistant

## 2020-04-15 DIAGNOSIS — R932 Abnormal findings on diagnostic imaging of liver and biliary tract: Secondary | ICD-10-CM

## 2020-04-15 MED ORDER — GADOBENATE DIMEGLUMINE 529 MG/ML IV SOLN
18.0000 mL | Freq: Once | INTRAVENOUS | Status: AC | PRN
  Administered 2020-04-15: 18 mL via INTRAVENOUS

## 2020-04-18 ENCOUNTER — Other Ambulatory Visit (HOSPITAL_COMMUNITY): Payer: Self-pay | Admitting: Physician Assistant

## 2020-04-18 DIAGNOSIS — K750 Abscess of liver: Secondary | ICD-10-CM

## 2020-04-21 ENCOUNTER — Encounter (HOSPITAL_COMMUNITY): Payer: Self-pay | Admitting: Radiology

## 2020-04-21 NOTE — Progress Notes (Signed)
Ike Bene Ricke Male, 60 y.o., 05/24/1960  MRN:  [id number] Phone:  [phone] (M) ...       PCP:  Gaynelle Arabian, MD Coverage:  Cigna/Cigna Managed            RE: US Aspiration of liver Received: Today Suttle, Rosanne Ashing, MD  Garth Bigness D  Approved for ultrasound guided segment VI intrahepatic fluid collection aspiration with possible drain placement.   Dylan        Previous Messages   ----- Message -----  From: Garth Bigness D  Sent: 04/18/2020  5:33 PM EST  To: Ir Procedure Requests  Subject: US Aspiration of liver              Procedure: US Aspiration (Liver)   Reason: Liver Abscess   History:  MR, CT in computer   Provider: Salley Slaughter   Provider Contact: [phone]

## 2020-04-29 ENCOUNTER — Other Ambulatory Visit: Payer: Self-pay | Admitting: Student

## 2020-04-30 ENCOUNTER — Other Ambulatory Visit (HOSPITAL_COMMUNITY): Payer: Self-pay | Admitting: Physician Assistant

## 2020-04-30 ENCOUNTER — Ambulatory Visit (HOSPITAL_COMMUNITY)
Admission: RE | Admit: 2020-04-30 | Discharge: 2020-04-30 | Disposition: A | Discharge status: Home / Self Care | Payer: Managed Care, Other (non HMO) | Source: Ambulatory Visit | Attending: Physician Assistant | Admitting: Physician Assistant

## 2020-04-30 ENCOUNTER — Other Ambulatory Visit: Payer: Self-pay

## 2020-04-30 ENCOUNTER — Encounter (HOSPITAL_COMMUNITY): Payer: Self-pay

## 2020-04-30 DIAGNOSIS — F419 Anxiety disorder, unspecified: Secondary | ICD-10-CM | POA: Insufficient documentation

## 2020-04-30 DIAGNOSIS — E785 Hyperlipidemia, unspecified: Secondary | ICD-10-CM | POA: Diagnosis not present

## 2020-04-30 DIAGNOSIS — K861 Other chronic pancreatitis: Secondary | ICD-10-CM | POA: Diagnosis not present

## 2020-04-30 DIAGNOSIS — Z87891 Personal history of nicotine dependence: Secondary | ICD-10-CM | POA: Insufficient documentation

## 2020-04-30 DIAGNOSIS — K769 Liver disease, unspecified: Secondary | ICD-10-CM | POA: Diagnosis present

## 2020-04-30 DIAGNOSIS — Z981 Arthrodesis status: Secondary | ICD-10-CM | POA: Insufficient documentation

## 2020-04-30 DIAGNOSIS — Z79899 Other long term (current) drug therapy: Secondary | ICD-10-CM | POA: Insufficient documentation

## 2020-04-30 DIAGNOSIS — K750 Abscess of liver: Secondary | ICD-10-CM

## 2020-04-30 DIAGNOSIS — F32A Depression, unspecified: Secondary | ICD-10-CM | POA: Diagnosis not present

## 2020-04-30 DIAGNOSIS — Z9689 Presence of other specified functional implants: Secondary | ICD-10-CM | POA: Diagnosis not present

## 2020-04-30 DIAGNOSIS — Z7982 Long term (current) use of aspirin: Secondary | ICD-10-CM | POA: Insufficient documentation

## 2020-04-30 DIAGNOSIS — M1990 Unspecified osteoarthritis, unspecified site: Secondary | ICD-10-CM | POA: Diagnosis not present

## 2020-04-30 DIAGNOSIS — D134 Benign neoplasm of liver: Secondary | ICD-10-CM | POA: Insufficient documentation

## 2020-04-30 DIAGNOSIS — F1010 Alcohol abuse, uncomplicated: Secondary | ICD-10-CM | POA: Diagnosis not present

## 2020-04-30 LAB — PROTIME-INR
INR: 1.1 (ref 0.8–1.2)
Prothrombin Time: 14 seconds (ref 11.4–15.2)

## 2020-04-30 LAB — CBC
HCT: 37.7 % — ABNORMAL LOW (ref 39.0–52.0)
Hemoglobin: 12.1 g/dL — ABNORMAL LOW (ref 13.0–17.0)
MCH: 29.6 pg (ref 26.0–34.0)
MCHC: 32.1 g/dL (ref 30.0–36.0)
MCV: 92.2 fL (ref 80.0–100.0)
Platelets: 120 10*3/uL — ABNORMAL LOW (ref 150–400)
RBC: 4.09 MIL/uL — ABNORMAL LOW (ref 4.22–5.81)
RDW: 14 % (ref 11.5–15.5)
WBC: 4.8 10*3/uL (ref 4.0–10.5)
nRBC: 0 % (ref 0.0–0.2)

## 2020-04-30 MED ORDER — MIDAZOLAM HCL 2 MG/2ML IJ SOLN
INTRAMUSCULAR | Status: DC | PRN
  Administered 2020-04-30: 1 mg via INTRAVENOUS

## 2020-04-30 MED ORDER — SODIUM CHLORIDE 0.9 % IV SOLN: INTRAVENOUS | Status: DC

## 2020-04-30 MED ORDER — LIDOCAINE HCL (PF) 1 % IJ SOLN
INTRAMUSCULAR | Status: AC
  Filled 2020-04-30: qty 30

## 2020-04-30 MED ORDER — FENTANYL CITRATE (PF) 100 MCG/2ML IJ SOLN
INTRAMUSCULAR | Status: DC | PRN
  Administered 2020-04-30: 25 ug via INTRAVENOUS

## 2020-04-30 MED ORDER — HYDROCODONE-ACETAMINOPHEN 5-325 MG PO TABS: 1.0000 | ORAL_TABLET | ORAL | Status: DC | PRN

## 2020-04-30 MED ORDER — MIDAZOLAM HCL 2 MG/2ML IJ SOLN
INTRAMUSCULAR | Status: AC
  Filled 2020-04-30: qty 2

## 2020-04-30 MED ORDER — GELATIN ABSORBABLE 12-7 MM EX MISC
CUTANEOUS | Status: AC
  Filled 2020-04-30: qty 1

## 2020-04-30 MED ORDER — FENTANYL CITRATE (PF) 100 MCG/2ML IJ SOLN
INTRAMUSCULAR | Status: AC
  Filled 2020-04-30: qty 2

## 2020-04-30 NOTE — H&P (Signed)
Chief Complaint: Patient was seen in consultation [name] for liver lesion  Referring Physician(s): [employer]  Supervising Physician: Mir, Sharen Heck  Patient Status: [facility] - Out-pt  History of Present Illness: Leonard [name] is a 60 y.o. male with past medical history of depression, anxiety, HLD, osteoarthritis, EtOH abuse recently admitted 03/22/19 with acute on chronic pancreatitis and fevers at home. A CT completed 1 day prior on 03/20/20 showed approximately 3-4 cm region of newly seen low-density at the caudal tip of the right lobe representing a mass, focal fat or infectious hepatitis. Small areas of low-density adjacent to the porta hepatis most likely focal fat.  He was admitted with SIRS, possible pneumonia and AKI. He improved significantly within a few days and was discharged home.  He recently followed up with GI who recommended MR Abdomen.  MR Abdomen 04/16/20 showed: 1. There is a hypoenhancing mass of the inferior tip of the right lobe of the liver, hepatic segment VI, measuring 3.8 x 3.3 cm, with internal fluid signal, most consistent with hepatic abscess and similar in appearance to prior noncontrast CT. 2. Pancreatic duct cannot be visualized to the pancreatic head and there is a dilated portion of the pancreatic duct within the pancreatic neck measuring 1.0 cm. Previously noted pancreatic ductal stent is not appreciated. 3. Mild intra and extrahepatic biliary ductal dilatation, the common bile duct measuring up to 0.9 cm in caliber. There is abrupt termination of the common bile duct within the pancreatic head. 4. Constellation of ductal findings above most likely reflects sequelae of chronic/recurrent pancreatitis, however the presence of a mass within the pancreatic head would be very difficult to exclude. There is no obvious mass appreciated by current examination and overall appearance and configuration is similar to prior examination dated  10/22/2019. 5. There is a rim enhancing fluid collection about the pancreatic tip and splenic hilum, not significantly changed compared to prior examination measuring approximately 2.9 x 2.3 cm in greatest axial dimension, which remains consistent with pancreatic pseudocyst.  Patient referred to IR for aspiration vs. Drainage.  Case reviewed and approved by Dr. Serafina Royals.   Leonard [name] in his usual state of health.  Complains of occasional, intermittent abdominal pain presents since diagnosis of pancreatitis.  He has been on azithromycin and Cipro since discharge.  No nausea, vomiting.  Reports he initially continued to have fevers at home after discharge, but none in the past several days.  He understands the goals of the procedure [name] and is agreeable to proceed.   Past Medical History:  Diagnosis Date  . Abnormal EKG    anterolateral T wave inversion  . Anxiety   . Arthritis    "lower back, maybe some in my neck" (08/05/2017)  . 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 Chronic lower back pain    spondylolisthesis,spondylosis,DDD,stenosis  . Depression   . Dizziness   . Hyperlipidemia    was on meds but has been off x 5 months d/t elevated liver enzymes  . Hyperlipidemia    statin intolerant because of elevated liver function tests  . Iliac artery aneurysm, bilateral ([facility])    atable at 2.2 cm 07/28/17  . Insomnia    takes Trazodone nightly  . Numbness and tingling of both legs   . Opiate addiction ([place])   . Pancreatitis 08/03/2017  . PONV (postoperative nausea and vomiting)   . Skin cancer    "forehead; right posterior neck"  . Weakness    pain  and occasionally tingling in right arm and leg    Past Surgical History:  Procedure Laterality Date  . ANTERIOR CERVICAL DECOMP/DISCECTOMY FUSION  2003, 2007   x 2  . ANTERIOR CERVICAL DECOMP/DISCECTOMY FUSION N/A 10/13/2017   Procedure: Cervical Four-Five Cervical Five-Six Anterior cervical  decompression/discectomy/fusion with exploration of Cervical Three-Four, Cervical Six-Seven fusions/possible removal of hardware;  Surgeon: Erline Levine, MD;  Location: [facility];  Service: Neurosurgery;  Laterality: N/A;  Cervical Four-Five Cervical Five-Six Anterior cervical decompression/discectomy/fusion with exploration of Cervical  . ANTERIOR LAT LUMBAR FUSION Right 09/29/2018   Procedure: Right Lumbar three-lumbar four Anterolateral lumbar interbody fusion with lateral plate;  Surgeon: Erline Levine, MD;  Location: [facility];  Service: Neurosurgery;  Laterality: Right;  . APPENDECTOMY  as a child  . BACK SURGERY    . BILIARY STENT PLACEMENT N/A 03/21/2019   Procedure: BILIARY STENT PLACEMENT;  Surgeon: Clarene Essex, MD;  Location: [facility];  Service: Endoscopy;  Laterality: N/A;  . COLONOSCOPY    . ERCP N/A 03/21/2019   Procedure: ENDOSCOPIC RETROGRADE CHOLANGIOPANCREATOGRAPHY (ERCP) with stent placement;  Surgeon: Clarene Essex, MD;  Location: [facility];  Service: Endoscopy;  Laterality: N/A;  . KNEE ARTHROSCOPY Right 2011  . LUMBAR LAMINECTOMY  2014   L4-5  . MOHS SURGERY     "forehead; right posterior neck"  . POSTERIOR LUMBAR FUSION  2015  . radial nerve impingement Right 2009   "near elbor"  . SPHINCTEROTOMY  03/21/2019   Procedure: SPHINCTEROTOMY;  Surgeon: Clarene Essex, MD;  Location: [facility];  Service: Endoscopy;;  balloon sweep  . TIBIA FRACTURE SURGERY Right as a child   compound fracture    Allergies: Duloxetine, Sulfa antibiotics, and Zocor [simvastatin]  Medications: Prior to Admission medications   Medication Sig Start Date End Date Taking? Authorizing Provider  aspirin 81 MG EC tablet Take 81 mg by mouth daily. Swallow whole.   Yes [provider]  Cyanocobalamin (B-12) 5000 MCG SUBL Place 5,000 mcg under the tongue daily.   Yes [provider]  fluticasone (FLONASE) 50 MCG/ACT nasal spray Place 1-2 sprays into both nostrils daily as needed for  allergies or rhinitis.   Yes [provider]  ibuprofen (ADVIL) 200 MG tablet Take 600 mg by mouth daily as needed for mild pain.   Yes [provider]  Multiple Vitamin (MULTIVITAMIN WITH MINERALS) TABS tablet Take 1 tablet by mouth daily. 03/09/19  Yes Georgette Shell, MD  naphazoline-pheniramine (NAPHCON-A) 0.025-0.3 % ophthalmic solution Place 1 drop into both eyes 2 (two) times daily as needed for eye irritation or allergies (for seasonal allergies).   Yes [provider]  polyethylene glycol powder (GLYCOLAX/MIRALAX) 17 GM/SCOOP powder Take 1 Container by mouth daily as needed for mild constipation. 03/09/19  Yes [provider]  pregabalin (LYRICA) 75 MG capsule Take 150 mg by mouth 2 (two) times daily.   Yes [provider]  sertraline (ZOLOFT) 50 MG tablet Take 75 mg by mouth 2 (two) times daily.    Yes [provider]  sildenafil (VIAGRA) 100 MG tablet Take 50-100 mg by mouth daily as needed for erectile dysfunction.  04/07/18  Yes [provider]  traZODone (DESYREL) 150 MG tablet Take 150 mg by mouth at bedtime.   Yes [provider]  acetaminophen (TYLENOL) 325 MG tablet Take 2 tablets (650 mg total) by mouth every 6 (six) hours as needed for mild pain. Patient taking differently: Take 975 mg by mouth daily. 04/18/19   Wynetta Emery,  Danton Sewer, PA-C  Cholecalciferol (VITAMIN D3) 50 MCG (2000 UT) TABS Take 2,000 Units by mouth daily.    [provider]  docusate sodium (COLACE) 100 MG capsule Take 100 mg by mouth daily as needed for mild constipation.    [provider]  guaiFENesin-dextromethorphan (ROBITUSSIN DM) 100-10 MG/5ML syrup Take 10 mLs by mouth every 6 (six) hours as needed for cough. 03/24/20   Shelly Coss, MD  lipase/protease/amylase (CREON) 36000 UNITS CPEP capsule Take 1-2 capsules (36,000-72,000 Units total) by mouth 4 (four) times daily. Patient not taking: No sig reported 03/08/19    Georgette Shell, MD  ondansetron (ZOFRAN) 4 MG tablet Take 1 tablet (4 mg total) by mouth every 6 (six) hours as needed for nausea. Patient not taking: Reported on 03/21/2020 03/08/19   Georgette Shell, MD     Family History  Problem Relation Age of Onset  . Arthritis Mother   . Cirrhosis Father   . Stroke Neg Hx     Social History   Socioeconomic History  . Marital status: Married    Spouse name: Not on file  . Number of children: 0  . Years of education: 88  . Highest education level: Not on file  Occupational History  . Occupation: [employer]  Tobacco Use  . Smoking status: Former Smoker    Packs/day: 0.50    Years: 2.00    Pack years: 1.00    Types: Cigarettes  . Smokeless tobacco: Former Systems developer    Types: Snuff    Quit date: 09/14/2017  . Tobacco comment: smoked while he was in high school;   Vaping Use  . Vaping Use: Never used  Substance and Sexual Activity  . Alcohol use: Not Currently    Alcohol/week: 84.0 standard drinks    Types: 84 Glasses of wine per week    Comment: 08/05/2017 "2 bottles of red wine/night", 09/26/2018 not currently drinking alcohol  . Drug use: Not Currently  . Sexual activity: Yes  Other Topics Concern  . Not on file  Social History Narrative   ** Merged History Encounter **   Lives at home w/ his wife   Right-handed   Caffeine: 3 cups coffee per day   Social Determinants of Health   Financial Resource Strain: Not on file  Food Insecurity: Not on file  Transportation Needs: Not on file  Physical Activity: Not on file  Stress: Not on file  Social Connections: Not on file     Review of Systems: A 12 point ROS discussed and pertinent positives are indicated in the HPI above.  All other systems are negative.  Review of Systems  Constitutional: Negative for fatigue and fever.  Respiratory: Negative for cough and shortness of breath.   Cardiovascular: Negative for chest pain.  Gastrointestinal: Positive  for abdominal pain. Negative for nausea and vomiting.  Musculoskeletal: Negative for back pain.  Psychiatric/Behavioral: Negative for behavioral problems and confusion.    Vital Signs: BP 104/83   Pulse 76   Temp 98.4 F (36.9 C)   Ht 5\' 10"  (1.778 m)   Wt 180 lb (81.6 kg)   SpO2 99%   BMI 25.83 kg/m   Physical Exam Vitals and nursing note reviewed.  Constitutional:      General: He is not in acute distress.    Appearance: Normal appearance. He is not ill-appearing.  HENT:     Mouth/Throat:     Mouth: Mucous membranes are moist.  Pharynx: Oropharynx is clear.  Cardiovascular:     Rate and Rhythm: Normal rate and regular rhythm.  Pulmonary:     Effort: Pulmonary effort is normal.     Breath sounds: Normal breath sounds.  Abdominal:     General: Abdomen is flat.     Palpations: Abdomen is soft.  Skin:    General: Skin is warm and dry.  Neurological:     General: No focal deficit present.     Mental Status: He is alert and oriented to person, place, and time. Mental status is at baseline.  Psychiatric:        Mood and Affect: Mood normal.        Behavior: Behavior normal.        Thought Content: Thought content normal.        Judgment: Judgment normal.      MD Evaluation Airway: WNL Heart: WNL Abdomen: WNL Chest/ Lungs: WNL ASA  Classification: 3 Mallampati/Airway Score: Two   Imaging: MR ABDOMEN MRCP W WO CONTAST  Result Date: 04/16/2020 CLINICAL DATA:  Characterize liver lesion of right lobe, pancreatic ductal stenting and chronic pancreatitis EXAM: MRI ABDOMEN WITHOUT AND WITH CONTRAST (INCLUDING MRCP) TECHNIQUE: Multiplanar multisequence MR imaging of the abdomen was performed both before and after the administration of intravenous contrast. Heavily T2-weighted images of the biliary and pancreatic ducts were obtained, and three-dimensional MRCP images were rendered by post processing. CONTRAST:  67mL MULTIHANCE GADOBENATE DIMEGLUMINE 529 MG/ML IV SOLN  COMPARISON:  CT abdomen pelvis, 03/20/2020, MR abdomen, 10/22/2019 FINDINGS: Lower chest: No acute findings. Hepatobiliary: There is a hypoenhancing mass of the inferior tip of the right lobe of the liver, hepatic segment VI, measuring 3.8 x 3.3 cm, with internal fluid signal (series 15, image 65). Focal fatty deposition adjacent to the gallbladder fossa and ligamentum venosum (series 4, image 17). There is mild intra and extrahepatic biliary ductal dilatation, the common bile duct measuring up to 0.9 cm in caliber. There is a very low lying insertion of the cystic duct within the pancreatic head, and there is abrupt termination of the common bile duct within the pancreatic head (series 11, image 34). Pancreas: The pancreatic duct cannot be visualized to the pancreatic head and there is a dilated portion of pancreatic duct within the pancreatic neck measuring 1.0 cm. Previously noted pancreatic ductal stent is not appreciated. There is a rim enhancing fluid collection about the pancreatic tip and splenic hilum, not significantly changed compared to prior examination measuring approximately 2.9 x 2.3 cm in greatest axial dimension (series 18, image 59). Parenchymal calcifications throughout the pancreas. No acute inflammatory findings are appreciated. Spleen:  Within normal limits in size and appearance. Adrenals/Urinary Tract: No masses identified. No evidence of hydronephrosis. Stomach/Bowel: Visualized portions within the abdomen are unremarkable. Vascular/Lymphatic: No pathologically enlarged lymph nodes identified. No abdominal aortic aneurysm demonstrated. Other:  None. Musculoskeletal: No suspicious bone lesions identified. IMPRESSION: 1. There is a hypoenhancing mass of the inferior tip of the right lobe of the liver, hepatic segment VI, measuring 3.8 x 3.3 cm, with internal fluid signal, most consistent with hepatic abscess and similar in appearance to prior noncontrast CT. 2. Pancreatic duct cannot be  visualized to the pancreatic head and there is a dilated portion of the pancreatic duct within the pancreatic neck measuring 1.0 cm. Previously noted pancreatic ductal stent is not appreciated. 3. Mild intra and extrahepatic biliary ductal dilatation, the common bile duct measuring up to 0.9 cm in caliber. There is abrupt  termination of the common bile duct within the pancreatic head. 4. Constellation of ductal findings above most likely reflects sequelae of chronic/recurrent pancreatitis, however the presence of a mass within the pancreatic head would be very difficult to exclude. There is no obvious mass appreciated by current examination and overall appearance and configuration is similar to prior examination dated 10/22/2019. 5. There is a rim enhancing fluid collection about the pancreatic tip and splenic hilum, not significantly changed compared to prior examination measuring approximately 2.9 x 2.3 cm in greatest axial dimension, which remains consistent with pancreatic pseudocyst. Electronically Signed   By: Eddie Candle M.D.   On: 04/16/2020 10:41    Labs:  CBC: Recent Labs    03/22/20 0616 03/23/20 0539 03/24/20 0543 04/30/20 1112  WBC 7.4 11.7* 8.8 4.8  HGB 12.6* 11.8* 11.1* 12.1*  HCT 38.0* 35.3* 33.0* 37.7*  PLT 133* 127* 147* 120*    COAGS: Recent Labs    04/30/20 1112  INR 1.1    BMP: Recent Labs    03/20/20 1240 03/21/20 0838 03/22/20 0616 03/23/20 0539  NA 136 139 141 140  K 3.4* 3.2* 3.8 4.2  CL 99 103 105 105  CO2 24 24 26 24   GLUCOSE 143* 137* 147* 144*  BUN 33* 23* 19 14  CALCIUM 9.1 8.6* 8.8* 8.7*  CREATININE 1.45* 1.24 0.84 0.99  GFRNONAA 55* >60 >60 >60    LIVER FUNCTION TESTS: Recent Labs    03/20/20 1240 03/22/20 0616 03/23/20 0539  BILITOT 0.6 1.1 1.4*  AST 37 85* 72*  ALT 45* 69* 67*  ALKPHOS 143* 169* 144*  PROT 7.3 7.1 6.8  ALBUMIN 2.9* 2.8* 2.7*    TUMOR MARKERS: No results for input(s): AFPTM, CEA, CA199, CHROMGRNA in the last  8760 hours.  Assessment and Plan: Patient with past medical history of chronic pancreatitis presents with complaint of liver lesion concerning for mass vs. abscess.  IR consulted for aspiration and drainage at the request of Salley Slaughter. Case reviewed by Dr. Serafina Royals who approves patient for procedure.  Patient presents [name] in their usual state of health.  He has been NPO and is not currently on blood thinners.   Risks and benefits was discussed with the patient and/or patient's family including, but not limited to bleeding, infection, damage to adjacent structures or low yield requiring additional tests.  All of the questions were answered and there is agreement to proceed.  Consent signed and in chart.   Thank you for this interesting consult.  I greatly enjoyed meeting Leonard [name] and look forward to participating in their care.  A copy of this report was sent to the requesting provider on this date.  Electronically Signed: Docia Barrier, PA 04/30/2020, 12:28 PM   I spent a total of  30 Minutes   in face to face in clinical consultation, greater than 50% of which was counseling/coordinating care for liver lesion.

## 2020-04-30 NOTE — Discharge Instructions (Signed)
Liver Biopsy, Care After These instructions give you information on caring for yourself after your procedure. Your doctor may also give you more specific instructions. Call your doctor if you have any problems or questions after your procedure. What can I expect after the procedure? After the procedure, it is common to have:  Pain and soreness where the biopsy was done.  Bruising around the area where the biopsy was done.  Sleepiness and be tired for a few days. Follow these instructions at home: Medicines  Take over-the-counter and prescription medicines only as told by your doctor.  If you were prescribed an antibiotic medicine, take it as told by your doctor. Do not stop taking the antibiotic even if you start to feel better.  Do not take medicines such as aspirin and ibuprofen. These medicines can thin your blood. Do not take these medicines unless your doctor tells you to take them.  If you are taking prescription pain medicine, take actions to prevent or treat constipation. Your doctor may recommend that you: ? Drink enough fluid to keep your pee (urine) clear or pale yellow. ? Take over-the-counter or prescription medicines. ? Eat foods that are high in fiber, such as fresh fruits and vegetables, whole grains, and beans. ? Limit foods that are high in fat and processed sugars, such as fried and sweet foods. Caring for your cut  Follow instructions from your doctor about how to take care of your cuts from surgery (incisions). Make sure you: ? Wash your hands with soap and water before you change your bandage (dressing). If you cannot use soap and water, use hand sanitizer. ? Change your bandage as told by your doctor. ? Leave stitches (sutures), skin glue, or skin tape (adhesive) strips in place. They may need to stay in place for 2 weeks or longer. If tape strips get loose and curl up, you may trim the loose edges. Do not remove tape strips completely unless your doctor says it is  okay.  Check your cuts every day for signs of infection. Check for: ? Redness, swelling, or more pain. ? Fluid or blood. ? Pus or a bad smell. ? Warmth.  Do not take baths, swim, or use a hot tub until your doctor says it is okay to do so. Activity  Rest at home for 1-2 days or as told by your doctor. ? Avoid sitting for a long time without moving. Get up to take short walks every 1-2 hours.  Return to your normal activities as told by your doctor. Ask what activities are safe for you.  Do not do these things in the first 24 hours: ? Drive. ? Use machinery. ? Take a bath or shower.  Do not lift more than 10 pounds (4.5 kg) or play contact sports for the first 2 weeks.   General instructions  Do not drink alcohol in the first week after the procedure.  Have someone stay with you for at least 24 hours after the procedure.  Get your test results. Ask your doctor or the department that is doing the test: ? When will my results be ready? ? How will I get my results? ? What are my treatment options? ? What other tests do I need? ? What are my next steps?  Keep all follow-up visits as told by your doctor. This is important.   Contact a doctor if:  A cut bleeds and leaves more than just a small spot of blood.  A cut is red,   puffs up (swells), or hurts more than before.  Fluid or something else comes from a cut.  A cut smells bad.  You have a fever or chills. Get help right away if:  You have swelling, bloating, or pain in your belly (abdomen).  You get dizzy or faint.  You have a rash.  You feel sick to your stomach (nauseous) or throw up (vomit).  You have trouble breathing, feel short of breath, or feel faint.  Your chest hurts.  You have problems talking or seeing.  You have trouble with your balance or moving your arms or legs. Summary  After the procedure, it is common to have pain, soreness, bruising, and tiredness.  Your doctor will tell you how to  take care of yourself at home. Change your bandage, take your medicines, and limit your activities as told by your doctor.  Call your doctor if you have symptoms of infection. Get help right away if your belly swells, your cut bleeds a lot, or you have trouble talking or breathing. This information is not intended to replace advice given to you by your health care provider. Make sure you discuss any questions you have with your health care provider. Document Revised: 03/03/2017 Document Reviewed: 03/04/2017 Elsevier Patient Education  2021 Elsevier Inc.  

## 2020-04-30 NOTE — Procedures (Signed)
Interventional Radiology Procedure Note  Procedure: Right liver lesion biopsy  Indication: Right liver lesion  Findings: Please refer to procedural dictation for full description.  Complications: None  EBL: < 10 mL  Miachel Roux, MD [phone]

## 2020-05-02 LAB — SURGICAL PATHOLOGY

## 2020-05-05 LAB — AEROBIC/ANAEROBIC CULTURE W GRAM STAIN (SURGICAL/DEEP WOUND)
Culture: NO GROWTH
Gram Stain: NONE SEEN

## 2020-06-30 ENCOUNTER — Inpatient Hospital Stay (HOSPITAL_COMMUNITY)
Admission: EM | Admit: 2020-06-30 | Discharge: 2020-07-04 | DRG: 918 | Disposition: A | Discharge status: Other Acute Inpatient Hospital | Payer: Managed Care, Other (non HMO) | Attending: Internal Medicine | Admitting: Internal Medicine

## 2020-06-30 ENCOUNTER — Observation Stay (HOSPITAL_COMMUNITY): Payer: Managed Care, Other (non HMO)

## 2020-06-30 DIAGNOSIS — F1010 Alcohol abuse, uncomplicated: Secondary | ICD-10-CM | POA: Diagnosis present

## 2020-06-30 DIAGNOSIS — D696 Thrombocytopenia, unspecified: Secondary | ICD-10-CM | POA: Diagnosis present

## 2020-06-30 DIAGNOSIS — R0902 Hypoxemia: Secondary | ICD-10-CM | POA: Diagnosis present

## 2020-06-30 DIAGNOSIS — R339 Retention of urine, unspecified: Secondary | ICD-10-CM | POA: Diagnosis present

## 2020-06-30 DIAGNOSIS — W19XXXA Unspecified fall, initial encounter: Secondary | ICD-10-CM

## 2020-06-30 DIAGNOSIS — X838XXA Intentional self-harm by other specified means, initial encounter: Secondary | ICD-10-CM | POA: Diagnosis present

## 2020-06-30 DIAGNOSIS — F10239 Alcohol dependence with withdrawal, unspecified: Secondary | ICD-10-CM | POA: Diagnosis not present

## 2020-06-30 DIAGNOSIS — Z87891 Personal history of nicotine dependence: Secondary | ICD-10-CM

## 2020-06-30 DIAGNOSIS — G4700 Insomnia, unspecified: Secondary | ICD-10-CM | POA: Diagnosis present

## 2020-06-30 DIAGNOSIS — Z85828 Personal history of other malignant neoplasm of skin: Secondary | ICD-10-CM

## 2020-06-30 DIAGNOSIS — R7989 Other specified abnormal findings of blood chemistry: Secondary | ICD-10-CM | POA: Diagnosis present

## 2020-06-30 DIAGNOSIS — F419 Anxiety disorder, unspecified: Secondary | ICD-10-CM | POA: Diagnosis not present

## 2020-06-30 DIAGNOSIS — F1120 Opioid dependence, uncomplicated: Secondary | ICD-10-CM | POA: Diagnosis present

## 2020-06-30 DIAGNOSIS — T50912A Poisoning by multiple unspecified drugs, medicaments and biological substances, intentional self-harm, initial encounter: Secondary | ICD-10-CM | POA: Diagnosis present

## 2020-06-30 DIAGNOSIS — T43212A Poisoning by selective serotonin and norepinephrine reuptake inhibitors, intentional self-harm, initial encounter: Secondary | ICD-10-CM | POA: Diagnosis not present

## 2020-06-30 DIAGNOSIS — K7010 Alcoholic hepatitis without ascites: Secondary | ICD-10-CM | POA: Diagnosis present

## 2020-06-30 DIAGNOSIS — Z7982 Long term (current) use of aspirin: Secondary | ICD-10-CM

## 2020-06-30 DIAGNOSIS — Z20822 Contact with and (suspected) exposure to covid-19: Secondary | ICD-10-CM | POA: Diagnosis present

## 2020-06-30 DIAGNOSIS — F32A Depression, unspecified: Secondary | ICD-10-CM | POA: Diagnosis present

## 2020-06-30 DIAGNOSIS — E785 Hyperlipidemia, unspecified: Secondary | ICD-10-CM | POA: Diagnosis present

## 2020-06-30 DIAGNOSIS — F411 Generalized anxiety disorder: Secondary | ICD-10-CM | POA: Diagnosis present

## 2020-06-30 DIAGNOSIS — K861 Other chronic pancreatitis: Secondary | ICD-10-CM | POA: Diagnosis present

## 2020-06-30 DIAGNOSIS — G894 Chronic pain syndrome: Secondary | ICD-10-CM | POA: Diagnosis present

## 2020-06-30 DIAGNOSIS — Z981 Arthrodesis status: Secondary | ICD-10-CM

## 2020-06-30 DIAGNOSIS — Z882 Allergy status to sulfonamides status: Secondary | ICD-10-CM

## 2020-06-30 DIAGNOSIS — R338 Other retention of urine: Secondary | ICD-10-CM | POA: Diagnosis present

## 2020-06-30 DIAGNOSIS — Z888 Allergy status to other drugs, medicaments and biological substances status: Secondary | ICD-10-CM

## 2020-06-30 DIAGNOSIS — Z79899 Other long term (current) drug therapy: Secondary | ICD-10-CM

## 2020-06-30 DIAGNOSIS — Y92009 Unspecified place in unspecified non-institutional (private) residence as the place of occurrence of the external cause: Secondary | ICD-10-CM

## 2020-06-30 LAB — COMPREHENSIVE METABOLIC PANEL
ALT: 42 U/L (ref 0–44)
AST: 61 U/L — ABNORMAL HIGH (ref 15–41)
Albumin: 4 g/dL (ref 3.5–5.0)
Alkaline Phosphatase: 46 U/L (ref 38–126)
Anion gap: 11 (ref 5–15)
BUN: 12 mg/dL (ref 8–23)
CO2: 26 mmol/L (ref 22–32)
Calcium: 8.5 mg/dL — ABNORMAL LOW (ref 8.9–10.3)
Chloride: 105 mmol/L (ref 98–111)
Creatinine, Ser: 1.06 mg/dL (ref 0.61–1.24)
GFR, Estimated: >60 mL/min (ref >60)
Glucose, Bld: 119 mg/dL — ABNORMAL HIGH (ref 70–99)
Potassium: 5 mmol/L (ref 3.5–5.1)
Sodium: 142 mmol/L (ref 135–145)
Total Bilirubin: 0.9 mg/dL (ref 0.3–1.2)
Total Protein: 7.1 g/dL (ref 6.5–8.1)

## 2020-06-30 LAB — ETHANOL: Alcohol, Ethyl (B): <10 mg/dL (ref <10)

## 2020-06-30 LAB — HIV ANTIBODY (ROUTINE TESTING W REFLEX): HIV Screen 4th Generation wRfx: NONREACTIVE

## 2020-06-30 LAB — CK: Total CK: 239 U/L (ref 49–397)

## 2020-06-30 LAB — RAPID URINE DRUG SCREEN, HOSP PERFORMED
Amphetamines: NOT DETECTED
Barbiturates: NOT DETECTED
Benzodiazepines: NOT DETECTED
Cocaine: NOT DETECTED
Opiates: NOT DETECTED
Tetrahydrocannabinol: NOT DETECTED

## 2020-06-30 LAB — CBC WITH DIFFERENTIAL/PLATELET
Abs Immature Granulocytes: 0.02 10*3/uL (ref 0.00–0.07)
Basophils Absolute: 0 10*3/uL (ref 0.0–0.1)
Basophils Relative: 0 %
Eosinophils Absolute: 0 10*3/uL (ref 0.0–0.5)
Eosinophils Relative: 0 %
HCT: 45.5 % (ref 39.0–52.0)
Hemoglobin: 15.2 g/dL (ref 13.0–17.0)
Immature Granulocytes: 0 %
Lymphocytes Relative: 16 %
Lymphs Abs: 1.2 10*3/uL (ref 0.7–4.0)
MCH: 29.7 pg (ref 26.0–34.0)
MCHC: 33.4 g/dL (ref 30.0–36.0)
MCV: 88.9 fL (ref 80.0–100.0)
Monocytes Absolute: 0.4 10*3/uL (ref 0.1–1.0)
Monocytes Relative: 6 %
Neutro Abs: 5.5 10*3/uL (ref 1.7–7.7)
Neutrophils Relative %: 78 %
Platelets: 137 10*3/uL — ABNORMAL LOW (ref 150–400)
RBC: 5.12 MIL/uL (ref 4.22–5.81)
RDW: 13.3 % (ref 11.5–15.5)
WBC: 7.2 10*3/uL (ref 4.0–10.5)
nRBC: 0 % (ref 0.0–0.2)

## 2020-06-30 LAB — RESP PANEL BY RT-PCR (FLU A&B, COVID) ARPGX2
Influenza A by PCR: NEGATIVE
Influenza B by PCR: NEGATIVE
SARS Coronavirus 2 by RT PCR: NEGATIVE

## 2020-06-30 LAB — URINALYSIS, ROUTINE W REFLEX MICROSCOPIC
Bacteria, UA: NONE SEEN
Bilirubin Urine: NEGATIVE
Glucose, UA: NEGATIVE mg/dL
Ketones, ur: NEGATIVE mg/dL
Leukocytes,Ua: NEGATIVE
Nitrite: NEGATIVE
Protein, ur: NEGATIVE mg/dL
Specific Gravity, Urine: 1.012 (ref 1.005–1.030)
pH: 5 (ref 5.0–8.0)

## 2020-06-30 LAB — ACETAMINOPHEN LEVEL: Acetaminophen (Tylenol), Serum: <10 ug/mL — ABNORMAL LOW (ref 10–30)

## 2020-06-30 LAB — SALICYLATE LEVEL: Salicylate Lvl: <7 mg/dL — ABNORMAL LOW (ref 7.0–30.0)

## 2020-06-30 MED ORDER — LORAZEPAM 2 MG/ML IJ SOLN
0.0000 mg | Freq: Four times a day (QID) | INTRAMUSCULAR | Status: AC
  Administered 2020-06-30: 2 mg via INTRAVENOUS
  Administered 2020-06-30: 3 mg via INTRAVENOUS
  Administered 2020-07-01: 1 mg via INTRAVENOUS
  Administered 2020-07-01 – 2020-07-02 (×3): 2 mg via INTRAVENOUS
  Filled 2020-06-30 (×4): qty 1
  Filled 2020-06-30: qty 2
  Filled 2020-06-30 (×2): qty 1

## 2020-06-30 MED ORDER — CALCIUM GLUCONATE-NACL 2-0.675 GM/100ML-% IV SOLN
2.0000 g | Freq: Once | INTRAVENOUS | Status: AC
  Administered 2020-06-30: 2000 mg via INTRAVENOUS
  Filled 2020-06-30 (×2): qty 100

## 2020-06-30 MED ORDER — LORAZEPAM 2 MG/ML IJ SOLN
0.0000 mg | Freq: Two times a day (BID) | INTRAMUSCULAR | Status: AC
Start: 2020-07-02 — End: 2020-07-04
  Administered 2020-07-02: 2 mg via INTRAVENOUS

## 2020-06-30 MED ORDER — THIAMINE HCL 100 MG PO TABS
100.0000 mg | ORAL_TABLET | ORAL | Status: DC
  Administered 2020-07-01 – 2020-07-03 (×3): 100 mg via ORAL
  Filled 2020-06-30 (×4): qty 1

## 2020-06-30 MED ORDER — SODIUM CHLORIDE 0.9 % IV BOLUS
1000.0000 mL | Freq: Once | INTRAVENOUS | Status: AC
  Administered 2020-06-30: 1000 mL via INTRAVENOUS

## 2020-06-30 MED ORDER — THIAMINE HCL 100 MG/ML IJ SOLN: 100.0000 mg | INTRAMUSCULAR | Status: DC

## 2020-06-30 MED ORDER — ALBUTEROL SULFATE (2.5 MG/3ML) 0.083% IN NEBU: 2.5000 mg | INHALATION_SOLUTION | Freq: Four times a day (QID) | RESPIRATORY_TRACT | Status: DC | PRN

## 2020-06-30 MED ORDER — LORAZEPAM 1 MG PO TABS
1.0000 mg | ORAL_TABLET | ORAL | Status: AC | PRN
  Administered 2020-07-02: 2 mg via ORAL
  Administered 2020-07-02: 1 mg via ORAL
  Filled 2020-06-30: qty 1
  Filled 2020-06-30: qty 2

## 2020-06-30 MED ORDER — LIDOCAINE HCL URETHRAL/MUCOSAL 2 % EX GEL
1.0000 "application " | Freq: Once | CUTANEOUS | Status: AC
  Administered 2020-07-01: 1 via URETHRAL
  Filled 2020-06-30: qty 11

## 2020-06-30 MED ORDER — FOLIC ACID 1 MG PO TABS
1.0000 mg | ORAL_TABLET | ORAL | Status: DC
  Administered 2020-07-01 – 2020-07-03 (×3): 1 mg via ORAL
  Filled 2020-06-30 (×4): qty 1

## 2020-06-30 MED ORDER — NAPHAZOLINE-PHENIRAMINE 0.025-0.3 % OP SOLN
1.0000 [drp] | Freq: Two times a day (BID) | OPHTHALMIC | Status: DC | PRN
  Filled 2020-06-30: qty 15

## 2020-06-30 MED ORDER — TRAZODONE HCL 50 MG PO TABS
150.0000 mg | ORAL_TABLET | Freq: Every day | ORAL | Status: DC
  Administered 2020-06-30 – 2020-07-03 (×4): 150 mg via ORAL
  Filled 2020-06-30 (×4): qty 3

## 2020-06-30 MED ORDER — LACTATED RINGERS IV SOLN: INTRAVENOUS | Status: AC

## 2020-06-30 MED ORDER — LORAZEPAM 2 MG/ML IJ SOLN
0.5000 mg | Freq: Once | INTRAMUSCULAR | Status: AC
  Administered 2020-06-30: 0.5 mg via INTRAVENOUS
  Filled 2020-06-30: qty 1

## 2020-06-30 MED ORDER — PANCRELIPASE (LIP-PROT-AMYL) 36000-114000 UNITS PO CPEP
36000.0000 [IU] | ORAL_CAPSULE | Freq: Four times a day (QID) | ORAL | Status: DC
  Administered 2020-07-01: 36000 [IU] via ORAL
  Administered 2020-07-01 – 2020-07-04 (×12): 72000 [IU] via ORAL
  Filled 2020-06-30 (×16): qty 2

## 2020-06-30 MED ORDER — SODIUM CHLORIDE 0.9% FLUSH
3.0000 mL | Freq: Two times a day (BID) | INTRAVENOUS | Status: DC
  Administered 2020-06-30 – 2020-07-04 (×8): 3 mL via INTRAVENOUS

## 2020-06-30 MED ORDER — THIAMINE HCL 100 MG/ML IJ SOLN
Freq: Once | INTRAVENOUS | Status: AC
  Filled 2020-06-30 (×3): qty 1000

## 2020-06-30 MED ORDER — LORAZEPAM 2 MG/ML IJ SOLN
1.0000 mg | INTRAMUSCULAR | Status: AC | PRN
  Administered 2020-07-01 (×2): 4 mg via INTRAVENOUS
  Administered 2020-07-01: 3 mg via INTRAVENOUS
  Filled 2020-06-30 (×3): qty 2

## 2020-06-30 MED ORDER — ADULT MULTIVITAMIN W/MINERALS CH
1.0000 | ORAL_TABLET | ORAL | Status: DC
  Administered 2020-07-01 – 2020-07-03 (×3): 1 via ORAL
  Filled 2020-06-30 (×4): qty 1

## 2020-06-30 MED ORDER — SODIUM CHLORIDE 0.9 % IV SOLN: INTRAVENOUS | Status: DC

## 2020-06-30 MED ORDER — ENOXAPARIN SODIUM 40 MG/0.4ML ~~LOC~~ SOLN
40.0000 mg | SUBCUTANEOUS | Status: DC
  Administered 2020-06-30: 40 mg via SUBCUTANEOUS
  Filled 2020-06-30: qty 0.4

## 2020-06-30 NOTE — ED Notes (Signed)
Per staffing, no sitters available until 1900.

## 2020-06-30 NOTE — H&P (Addendum)
History and Physical    Leonard [name] [id number] DOB: 03-Aug-1960 DOA: 06/30/2020  Referring MD/NP/PA: Deno Etienne, MD PCP: Gaynelle Arabian, MD  Patient coming from: Home  Chief Complaint: Took a lot of pills  I have personally briefly reviewed patient's old medical records in [name]   HPI: Leonard [name] is a 60 y.o. male with medical history significant of hyperlipidemia nxiety, depression, bilateral iliac artery aneurysm, arthritis, alcohol /oopid abuse, and chronic back pain who presents with complaints taking a lot of pills after having an argument with his wife yesterday afternoon.  History is obtained from the patient with the assistance of his wife over the phone.  Wife states that they are in the middle of a separation and she still currently in the home.  He reportedly had not been paying his taxes since 2015 and they are in a big financial bind.  Yesterday his wife had removed all of the guns from in the home, and when he came home he asked her about them.  She told him what she had done and he stated that that was okay and that he had enough pills to do the job.  Patient reports taking approximately 30 pills of Lyrica and 30 tablets of trazodone with alcohol yesterday afternoon around 5 PM.  Since that time patient reports that he has been having tremors, dry mouth, and fell.  His wife reports that this is the first time he has ever tried to harm himself, but had been talking about possibly drowning himself after a friend did so last year.  Over the last 3 weeks or so she reports that he has been binge drinking a lot of vodka, but cannot quantify.  Patient admits to drinking beer and wine but also unable to quantify.  He has had issues with opioid and alcohol abuse requiring rehab several times in the past.  ED Course: Upon admission into the emergency department patient was seen to be afebrile with pulse 63-1 02, O2 saturations as low as 89 with improvement on 2 L of nasal  cannula oxygen to 93%, and all other vital signs maintained.  Labs for calcium 8.5, AST 61, alcohol levels undetectable, and salicylate level undetectable.  Urine drug screen was negative.  Poison control was notified and recommended observation with IV fluids, serial monitoring of EKG, and benzos as needed.  Influenza and COVID-19 screening were negative.  Patient was given  Ativan 0.5 mg x 1 dose IV, 1 L normal saline IV fluids, and then ordered lactated Ringer's at 250 mL/h for 6 hours.  Involuntary commitment orders were placed by the ED provider.  Review of Systems  Unable to perform ROS: Mental status change  Musculoskeletal: Positive for falls.  Neurological: Positive for tremors.  Psychiatric/Behavioral: Positive for substance abuse.    Past Medical History:  Diagnosis Date  . Abnormal EKG    anterolateral T wave inversion  . Anxiety   . Arthritis    "lower back, maybe some in my neck" (08/05/2017)  . 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 Chronic lower back pain    spondylolisthesis,spondylosis,DDD,stenosis  . Depression   . Dizziness   . Hyperlipidemia    was on meds but has been off x 5 months d/t elevated liver enzymes  . Hyperlipidemia    statin intolerant because of elevated liver function tests  . Iliac artery aneurysm, bilateral ([facility])    atable at 2.2 cm 07/28/17  . Insomnia  takes Trazodone nightly  . Numbness and tingling of both legs   . Opiate addiction ([place])   . Pancreatitis 08/03/2017  . PONV (postoperative nausea and vomiting)   . Skin cancer    "forehead; right posterior neck"  . Weakness    pain and occasionally tingling in right arm and leg    Past Surgical History:  Procedure Laterality Date  . ANTERIOR CERVICAL DECOMP/DISCECTOMY FUSION  2003, 2007   x 2  . ANTERIOR CERVICAL DECOMP/DISCECTOMY FUSION N/A 10/13/2017   Procedure: Cervical Four-Five Cervical Five-Six Anterior cervical decompression/discectomy/fusion with exploration of  Cervical Three-Four, Cervical Six-Seven fusions/possible removal of hardware;  Surgeon: Erline Levine, MD;  Location: [facility];  Service: Neurosurgery;  Laterality: N/A;  Cervical Four-Five Cervical Five-Six Anterior cervical decompression/discectomy/fusion with exploration of Cervical  . ANTERIOR LAT LUMBAR FUSION Right 09/29/2018   Procedure: Right Lumbar three-lumbar four Anterolateral lumbar interbody fusion with lateral plate;  Surgeon: Erline Levine, MD;  Location: [facility];  Service: Neurosurgery;  Laterality: Right;  . APPENDECTOMY  as a child  . BACK SURGERY    . BILIARY STENT PLACEMENT N/A 03/21/2019   Procedure: BILIARY STENT PLACEMENT;  Surgeon: Clarene Essex, MD;  Location: [facility];  Service: Endoscopy;  Laterality: N/A;  . COLONOSCOPY    . ERCP N/A 03/21/2019   Procedure: ENDOSCOPIC RETROGRADE CHOLANGIOPANCREATOGRAPHY (ERCP) with stent placement;  Surgeon: Clarene Essex, MD;  Location: [facility];  Service: Endoscopy;  Laterality: N/A;  . KNEE ARTHROSCOPY Right 2011  . LUMBAR LAMINECTOMY  2014   L4-5  . MOHS SURGERY     "forehead; right posterior neck"  . POSTERIOR LUMBAR FUSION  2015  . radial nerve impingement Right 2009   "near elbor"  . SPHINCTEROTOMY  03/21/2019   Procedure: SPHINCTEROTOMY;  Surgeon: Clarene Essex, MD;  Location: [facility];  Service: Endoscopy;;  balloon sweep  . TIBIA FRACTURE SURGERY Right as a child   compound fracture     reports that he has quit smoking. His smoking use included cigarettes. He has a 1.00 pack-year smoking history. He quit smokeless tobacco use about 2 years ago.  His smokeless tobacco use included snuff. He reports previous alcohol use of about 84.0 standard drinks of alcohol per week. He reports previous drug use.  Allergies  Allergen Reactions  . Duloxetine Other (See Comments)    Suicidal thoughts  . Sulfa Antibiotics Hives, Swelling, Rash and Other (See Comments)    Face swells and this class of meds caused welts, also (Substance  With Sulfonamide Structure And Antibacterial Mechanism Of Action)  . Zocor [Simvastatin] Other (See Comments)    Elevated his liver enzymes    Family History  Problem Relation Age of Onset  . Arthritis Mother   . Cirrhosis Father   . Stroke Neg Hx     Prior to Admission medications   Medication Sig Start Date End Date Taking? Authorizing Provider  acetaminophen (TYLENOL) 325 MG tablet Take 2 tablets (650 mg total) by mouth every 6 (six) hours as needed for mild pain. Patient taking differently: Take 975 mg by mouth daily. 04/18/19   Norm Parcel, PA-C  aspirin 81 MG EC tablet Take 81 mg by mouth daily. Swallow whole.    [provider]  Cholecalciferol (VITAMIN D3) 50 MCG (2000 UT) TABS Take 2,000 Units by mouth daily.    [provider]  Cyanocobalamin (B-12) 5000 MCG SUBL Place 5,000 mcg under the tongue daily.    [provider]  docusate sodium (COLACE)  100 MG capsule Take 100 mg by mouth daily as needed for mild constipation.    [provider]  fluticasone (FLONASE) 50 MCG/ACT nasal spray Place 1-2 sprays into both nostrils daily as needed for allergies or rhinitis.    [provider]  guaiFENesin-dextromethorphan (ROBITUSSIN DM) 100-10 MG/5ML syrup Take 10 mLs by mouth every 6 (six) hours as needed for cough. 03/24/20   Shelly Coss, MD  ibuprofen (ADVIL) 200 MG tablet Take 600 mg by mouth daily as needed for mild pain.    [provider]  lipase/protease/amylase (CREON) 36000 UNITS CPEP capsule Take 1-2 capsules (36,000-72,000 Units total) by mouth 4 (four) times daily. 03/08/19   Georgette Shell, MD  Multiple Vitamin (MULTIVITAMIN WITH MINERALS) TABS tablet Take 1 tablet by mouth daily. 03/09/19   Georgette Shell, MD  naphazoline-pheniramine (NAPHCON-A) 0.025-0.3 % ophthalmic solution Place 1 drop into both eyes 2 (two) times daily as needed for eye irritation or allergies (for seasonal allergies).    [provider]  ondansetron (ZOFRAN) 4 MG tablet Take 1 tablet (4 mg total) by mouth every 6 (six) hours as needed for nausea. 03/08/19   Georgette Shell, MD  polyethylene glycol powder [facility]) 17 GM/SCOOP powder Take 1 Container by mouth daily as needed for mild constipation. 03/09/19   [provider]  pregabalin (LYRICA) 75 MG capsule Take 150 mg by mouth 2 (two) times daily.    [provider]  sertraline (ZOLOFT) 50 MG tablet Take 75 mg by mouth 2 (two) times daily.     [provider]  sildenafil (VIAGRA) 100 MG tablet Take 50-100 mg by mouth daily as needed for erectile dysfunction.  04/07/18   [provider]  traZODone (DESYREL) 150 MG tablet Take 150 mg by mouth at bedtime.    [provider]    Physical Exam:  Constitutional: Older male who appears to be in some distress and has difficulty answering questions Vitals:   06/30/20 1315 06/30/20 1330 06/30/20 1345 06/30/20 1400  BP: 126/81 129/86 139/86 125/83  Pulse: 63 98 95 (!) 101  Resp: 14 18 14 16   Temp:      TempSrc:      SpO2: 91% 91% 90% (!) 89%  Weight:      Height:       Eyes: PERRL, conjunctival injection present in both eyes ENMT: Mucous membranes are dry. Posterior pharynx clear of any exudate or lesions.   Neck: normal, supple, no masses, no thyromegaly Respiratory: clear to auscultation bilaterally, no wheezing, no crackles. Normal respiratory effort. No accessory muscle use.  Patient currently on 2 L nasal cannula oxygen with O2 saturations maintained. Cardiovascular: Regular rate and rhythm, no murmurs / rubs / gallops. No extremity edema. 2+ pedal pulses. No carotid bruits.  Abdomen: Suprapubic abdominal tenderness appreciated.  Bowel sounds present. Musculoskeletal: no clubbing / cyanosis. No joint deformity upper and lower extremities. Good ROM, no contractures. Normal muscle tone.  Skin: Patient appears flushed. Neurologic: CN 2-12 grossly intact.   Tremor present.  Psychiatric:  oriented x 3.  Depressed mood.     Labs on Admission: I have personally reviewed following labs and imaging studies  CBC: Recent Labs  Lab 06/30/20 0914  WBC 7.2  NEUTROABS 5.5  HGB 15.2  HCT 45.5  MCV 88.9  PLT 098*   Basic Metabolic Panel: Recent Labs  Lab 06/30/20 1057  NA 142  K 5.0  CL 105  CO2 26  GLUCOSE 119*  BUN 12  CREATININE 1.06  CALCIUM 8.5*   GFR: Estimated Creatinine Clearance: 75.6 mL/min (by C-G formula based on SCr of 1.06 mg/dL). Liver Function Tests: Recent Labs  Lab 06/30/20 1057  AST 61*  ALT 42  ALKPHOS 46  BILITOT 0.9  PROT 7.1  ALBUMIN 4.0   No results for input(s): LIPASE, AMYLASE in the last 168 hours. No results for input(s): AMMONIA in the last 168 hours. Coagulation Profile: No results for input(s): INR, PROTIME in the last 168 hours. Cardiac Enzymes: Recent Labs  Lab 06/30/20 1057  CKTOTAL 239   BNP (last 3 results) No results for input(s): PROBNP in the last 8760 hours. HbA1C: No results for input(s): HGBA1C in the last 72 hours. CBG: No results for input(s): GLUCAP in the last 168 hours. Lipid Profile: No results for input(s): CHOL, HDL, LDLCALC, TRIG, CHOLHDL, LDLDIRECT in the last 72 hours. Thyroid Function Tests: No results for input(s): TSH, T4TOTAL, FREET4, T3FREE, THYROIDAB in the last 72 hours. Anemia Panel: No results for input(s): VITAMINB12, FOLATE, FERRITIN, TIBC, IRON, RETICCTPCT in the last 72 hours. Urine analysis:    Component Value Date/Time   COLORURINE YELLOW 03/20/2020 1622   APPEARANCEUR CLEAR 03/20/2020 1622   LABSPEC 1.010 03/20/2020 1622   PHURINE 6.0 03/20/2020 1622   GLUCOSEU NEGATIVE 03/20/2020 1622   HGBUR NEGATIVE 03/20/2020 1622   BILIRUBINUR NEGATIVE 03/20/2020 1622   KETONESUR NEGATIVE 03/20/2020 1622   PROTEINUR 30 (A) 03/20/2020 1622   NITRITE NEGATIVE 03/20/2020 1622   LEUKOCYTESUR NEGATIVE 03/20/2020 1622   Sepsis Labs: Recent Results  (from the past 240 hour(s))  Resp Panel by RT-PCR (Flu A&B, Covid) Nasopharyngeal Swab     Status: None   Collection Time: 06/30/20  9:16 AM   Specimen: Nasopharyngeal Swab; Nasopharyngeal(NP) swabs in vial transport medium  Result Value Ref Range Status   SARS Coronavirus 2 by RT PCR NEGATIVE NEGATIVE Final    Comment: (NOTE) SARS-CoV-2 target nucleic acids are NOT DETECTED.  The SARS-CoV-2 RNA is generally detectable in upper respiratory specimens during the acute phase of infection. The lowest concentration of SARS-CoV-2 viral copies this assay can detect is 138 copies/mL. A negative result does not preclude SARS-Cov-2 infection and should not be used as the sole basis for treatment or other patient management decisions. A negative result may occur with  improper specimen collection/handling, submission of specimen other than nasopharyngeal swab, presence of viral mutation(s) within the areas targeted by this assay, and inadequate number of viral copies(<138 copies/mL). A negative result must be combined with clinical observations, patient history, and epidemiological information. The expected result is Negative.  Fact Sheet for Patients:  EntrepreneurPulse.com.au  Fact Sheet for Healthcare Providers:  IncredibleEmployment.be  This test is no t yet approved or cleared by the [place] FDA and  has been authorized for detection and/or diagnosis of SARS-CoV-2 by FDA under an Emergency Use Authorization (EUA). This EUA will remain  in effect (meaning this test can be used) for the duration of the COVID-19 declaration under Section 564(b)(1) of the Act, 21 U.S.C.section 360bbb-3(b)(1), unless the authorization is terminated  or revoked sooner.       Influenza A by PCR NEGATIVE NEGATIVE Final   Influenza B by PCR NEGATIVE NEGATIVE Final    Comment: (NOTE) The Xpert Xpress SARS-CoV-2/FLU/RSV plus assay is intended as an aid in the  diagnosis of influenza from Nasopharyngeal swab specimens and should not be used as a sole basis for treatment. Nasal washings and aspirates are unacceptable  for Xpert Xpress SARS-CoV-2/FLU/RSV testing.  Fact Sheet for Patients: EntrepreneurPulse.com.au  Fact Sheet for Healthcare Providers: IncredibleEmployment.be  This test is not yet approved or cleared by the [place] FDA and has been authorized for detection and/or diagnosis of SARS-CoV-2 by FDA under an Emergency Use Authorization (EUA). This EUA will remain in effect (meaning this test can be used) for the duration of the COVID-19 declaration under Section 564(b)(1) of the Act, 21 U.S.C. section 360bbb-3(b)(1), unless the authorization is terminated or revoked.  Performed at [facility], [place] [address], [place], [place] [address]      Radiological Exams on Admission: No results found.  EKG: Independently reviewed.  Sinus rhythm at 95 bpm with left axis deviation and borderline prolonged QT  Assessment/Plan Suicide attempt by multiple overdose: Acute.  Patient reportedly got an argument with his wife and per wife had attempted to locate his guns which she had removed.  Subsequently patient stated that he had enough pills to do the job. He subsequently took approximately 30 pills of trazodone and 30 pills of Lyrica in addition to alcohol.  Patient was involuntarily committed by the ED provider. -Admit to a medical telemetry bed -Suicide precaution with sitter -Neurochecks -Follow-up urine drug screen -Banana bag 100 mL/h -Consult placed for psychiatry to evaluate  Hypoxia: Patient noted to have intermittent dip of O2 saturations down to 89% for which she was placed on 2 L of nasal cannula oxygen.  Suspect this could have been iatrogenic in nature when patient was given Ativan. -Continuous pulse oximetry with 2 L nasal cannula oxygen as needed -Check chest x-ray(independently  reviewed and showed no acute abnormality)  Alcohol abuse: Patient reportedly had been binge drinking vodka over the last 3 weeks per his wife.  Initial alcohol level undetectable. -CIWA protocols with scheduled and as needed Ativan -Counseled patient on need of cessation of alcohol abuse -Thiamine, multivitamin, and folate  Hypocalcemia: Acute.  Calcium 8.5 on admission. -Give 2 g of calcium gluconate IV -Continue to monitor lites as needed  Reported fall: Prior to arrival.  He admits to likely falling at home.  No initial imaging had been performed. -Check CT scan of the brain and cervical spine and neck -Check chest x-ray  Urinary retention: Acute.  Patient complaining of suprapubic tenderness to palpation on physical exam.  Question secondary to recent overdose of medications. -Bladder scan(reported to be retaining 650 mL of urine in the bladder) -Placed order for Foley catheter if greater than 350 mL of urine present  Anxiety and depression: Home medications included Zoloft. -Holding Zoloft due to overdose of trazodone and Lyrica  Elevated LFTs: Acute on chronic.  AST 61 and ALT 42.  AST to ALT ratio is consistent with alcohol abuse. -Continue to monitor  DVT prophylaxis: lovenox  Code Status: full Family Communication: Wife updated over the phone Disposition Plan: To be determined Consults called: none Admission status: Observation, patient may require less than 24-hour admission  Norval Morton MD [facility]   If 7PM-7AM, please contact night-coverage   06/30/2020, 2:47 PM

## 2020-06-30 NOTE — ED Provider Notes (Signed)
[facility] EMERGENCY DEPARTMENT Provider Note   CSN: [id number] Arrival date & time: 06/30/20  [id number]     History Chief Complaint  Patient presents with  . Drug Overdose    Leonard [name] is a 60 y.o. male.  60 yo M with a chief complaint of tremors and increased thirst this morning after taking approximately 30 tablets of Lyrica and 30 tablets of trazodone and alcohol in an attempt to harm himself after an argument with his wife last night.  He estimates he did this about 5 PM yesterday.  Started noticing some severe tremors this morning.  Denies any other specific symptoms.  Denies confusion denies seizures.  Denies any other coingestants denies illegal drug use.  The history is provided by the patient and the EMS personnel.  Drug Overdose This is a new problem. The current episode started yesterday. The problem occurs constantly. The problem has not changed since onset.Pertinent negatives include no chest pain, no abdominal pain, no headaches and no shortness of breath. Nothing aggravates the symptoms. Nothing relieves the symptoms. He has tried nothing for the symptoms. The treatment provided no relief.       Past Medical History:  Diagnosis Date  . Abnormal EKG    anterolateral T wave inversion  . Anxiety   . Arthritis    "lower back, maybe some in my neck" (08/05/2017)  . 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 Chronic lower back pain    spondylolisthesis,spondylosis,DDD,stenosis  . Depression   . Dizziness   . Hyperlipidemia    was on meds but has been off x 5 months d/t elevated liver enzymes  . Hyperlipidemia    statin intolerant because of elevated liver function tests  . Iliac artery aneurysm, bilateral (HCC)    atable at 2.2 cm 07/28/17  . Insomnia    takes Trazodone nightly  . Numbness and tingling of both legs   . Opiate addiction ([place])   . Pancreatitis 08/03/2017  . PONV (postoperative nausea and vomiting)   . Skin cancer     "forehead; right posterior neck"  . Weakness    pain and occasionally tingling in right arm and leg    Patient Active Problem List   Diagnosis Date Noted  . Suicide ([place]) 07/01/2020  . Suicide attempt by multiple drug overdose, initial encounter ([place]) 06/30/2020  . Alcohol abuse 06/30/2020  . Acute urinary retention 06/30/2020  . Fall at home, initial encounter 06/30/2020  . Hypocalcemia 06/30/2020  . AKI (acute kidney injury) ([place]) 03/21/2020  . Acute respiratory failure with hypoxia ([place]) 03/20/2020  . Colitis 04/16/2019  . Chronic pancreatitis ([place]) 03/01/2019  . Alcohol dependence in remission ([place]) 03/01/2019  . Acute on chronic pancreatitis ([place]) 03/01/2019  . Cervical stenosis of spinal canal 10/13/2017  . Acute pancreatitis 08/04/2017  . Elevated LFTs 08/04/2017  . Carotid artery dissection ([place]) 08/04/2017  . Chronic neck pain 08/04/2017  . Acute alcoholic pancreatitis [id number]  . Hay fever 07/18/2015  . Anxiety state 07/18/2015  . Fatty infiltration of liver 07/18/2015  . Insomnia 07/18/2015  . Anxiety and depression 04/02/2015  . Spondylolisthesis of lumbar region 09/10/2013  . Hyperlipidemia 09/05/2013  . Opiate dependence ([place]) 08/20/2013    Past Surgical History:  Procedure Laterality Date  . ANTERIOR CERVICAL DECOMP/DISCECTOMY FUSION  2003, 2007   x 2  . ANTERIOR CERVICAL DECOMP/DISCECTOMY FUSION N/A 10/13/2017   Procedure: Cervical Four-Five Cervical Five-Six Anterior cervical decompression/discectomy/fusion with exploration of Cervical Three-Four,  Cervical Six-Seven fusions/possible removal of hardware;  Surgeon: Erline Levine, MD;  Location: [place];  Service: Neurosurgery;  Laterality: N/A;  Cervical Four-Five Cervical Five-Six Anterior cervical decompression/discectomy/fusion with exploration of Cervical  . ANTERIOR LAT LUMBAR FUSION Right 09/29/2018   Procedure: Right Lumbar three-lumbar four Anterolateral lumbar interbody fusion with lateral plate;   Surgeon: Erline Levine, MD;  Location: [place];  Service: Neurosurgery;  Laterality: Right;  . APPENDECTOMY  as a child  . BACK SURGERY    . BILIARY STENT PLACEMENT N/A 03/21/2019   Procedure: BILIARY STENT PLACEMENT;  Surgeon: Clarene Essex, MD;  Location: [facility];  Service: Endoscopy;  Laterality: N/A;  . COLONOSCOPY    . ERCP N/A 03/21/2019   Procedure: ENDOSCOPIC RETROGRADE CHOLANGIOPANCREATOGRAPHY (ERCP) with stent placement;  Surgeon: Clarene Essex, MD;  Location: [facility];  Service: Endoscopy;  Laterality: N/A;  . KNEE ARTHROSCOPY Right 2011  . LUMBAR LAMINECTOMY  2014   L4-5  . MOHS SURGERY     "forehead; right posterior neck"  . POSTERIOR LUMBAR FUSION  2015  . radial nerve impingement Right 2009   "near elbor"  . SPHINCTEROTOMY  03/21/2019   Procedure: SPHINCTEROTOMY;  Surgeon: Clarene Essex, MD;  Location: [facility];  Service: Endoscopy;;  balloon sweep  . TIBIA FRACTURE SURGERY Right as a child   compound fracture       Family History  Problem Relation Age of Onset  . Arthritis Mother   . Cirrhosis Father   . Stroke Neg Hx     Social History   Tobacco Use  . Smoking status: Former Smoker    Packs/day: 0.50    Years: 2.00    Pack years: 1.00    Types: Cigarettes  . Smokeless tobacco: Former Systems developer    Types: Snuff    Quit date: 09/14/2017  . Tobacco comment: smoked while he was in high school;   Vaping Use  . Vaping Use: Never used  Substance Use Topics  . Alcohol use: Not Currently    Alcohol/week: 84.0 standard drinks    Types: 84 Glasses of wine per week    Comment: 08/05/2017 "2 bottles of red wine/night", 09/26/2018 not currently drinking alcohol  . Drug use: Not Currently    Home Medications Prior to Admission medications   Medication Sig Start Date End Date Taking? Authorizing Provider  acetaminophen (TYLENOL) 325 MG tablet Take 2 tablets (650 mg total) by mouth every 6 (six) hours as needed for mild pain. Patient taking differently: Take 975 mg  by mouth daily. 04/18/19   Norm Parcel, PA-C  aspirin 81 MG EC tablet Take 81 mg by mouth daily. Swallow whole.    [provider]  Cholecalciferol (VITAMIN D3) 50 MCG (2000 UT) TABS Take 2,000 Units by mouth daily.    [provider]  Cyanocobalamin (B-12) 5000 MCG SUBL Place 5,000 mcg under the tongue daily.    [provider]  docusate sodium (COLACE) 100 MG capsule Take 100 mg by mouth daily as needed for mild constipation.    [provider]  fluticasone (FLONASE) 50 MCG/ACT nasal spray Place 1-2 sprays into both nostrils daily as needed for allergies or rhinitis.    [provider]  guaiFENesin-dextromethorphan (ROBITUSSIN DM) 100-10 MG/5ML syrup Take 10 mLs by mouth every 6 (six) hours as needed for cough. 03/24/20   Shelly Coss, MD  ibuprofen (ADVIL) 200 MG tablet Take 600 mg by mouth daily as needed for mild pain.    [provider]  lipase/protease/amylase (CREON) 36000 UNITS CPEP capsule Take 1-2 capsules (36,000-72,000 Units total) by mouth 4 (four) times daily. 03/08/19   Georgette Shell, MD  Multiple Vitamin (MULTIVITAMIN WITH MINERALS) TABS tablet Take 1 tablet by mouth daily. 03/09/19   Georgette Shell, MD  naphazoline-pheniramine (NAPHCON-A) 0.025-0.3 % ophthalmic solution Place 1 drop into both eyes 2 (two) times daily as needed for eye irritation or allergies (for seasonal allergies).    [provider]  ondansetron (ZOFRAN) 4 MG tablet Take 1 tablet (4 mg total) by mouth every 6 (six) hours as needed for nausea. 03/08/19   Georgette Shell, MD  polyethylene glycol powder [facility]) 17 GM/SCOOP powder Take 1 Container by mouth daily as needed for mild constipation. 03/09/19   [provider]  pregabalin (LYRICA) 75 MG capsule Take 150 mg by mouth 2 (two) times daily.    [provider]  sertraline (ZOLOFT) 50 MG tablet Take 75 mg by mouth 2 (two) times daily.     [provider]  sildenafil (VIAGRA) 100 MG tablet Take 50-100 mg by mouth daily as needed for erectile dysfunction.  04/07/18   [provider]  traZODone (DESYREL) 150 MG tablet Take 150 mg by mouth at bedtime.    [provider]    Allergies    Duloxetine, Sulfa antibiotics, and Zocor [simvastatin]  Review of Systems   Review of Systems  Constitutional: Negative for chills and fever.  HENT: Negative for congestion and facial swelling.   Eyes: Negative for discharge and visual disturbance.  Respiratory: Negative for shortness of breath.   Cardiovascular: Negative for chest pain and palpitations.  Gastrointestinal: Negative for abdominal pain, diarrhea and vomiting.  Musculoskeletal: Negative for arthralgias and myalgias.  Skin: Negative for color change and rash.  Neurological: Positive for tremors. Negative for syncope and headaches.  Psychiatric/Behavioral: Negative for confusion and dysphoric mood.    Physical Exam Updated Vital Signs BP (!) 141/91 (BP Location: Left Arm)   Pulse 84   Temp 98.2 F (36.8 C) (Oral)   Resp (!) 21   Ht 5\' 10"  (1.778 m)   Wt 82 kg   SpO2 94%   BMI 25.94 kg/m   Physical Exam Vitals and nursing note reviewed.  Constitutional:      Appearance: He is well-developed.  HENT:     Head: Normocephalic and atraumatic.  Eyes:     Pupils: Pupils are equal, round, and reactive to light.  Neck:     Vascular: No JVD.  Cardiovascular:     Rate and Rhythm: Regular rhythm. Tachycardia present.     Heart sounds: No murmur heard. No friction rub. No gallop.   Pulmonary:     Effort: No respiratory distress.     Breath sounds: No wheezing.  Abdominal:     General: There is no distension.     Tenderness: There is no guarding or rebound.  Musculoskeletal:        General: Normal range of motion.     Cervical back: Normal range of motion and neck supple.  Skin:    Coloration: Skin is not pale.     Findings: No rash.  Neurological:      Mental Status: He is alert and oriented to person, place, and time.     Comments: Reflexes are normal.  Patient's skin is hot and dry.  Coarse tremor that fatigues.  Psychiatric:        Behavior: Behavior normal.     ED Results /  Procedures / Treatments   Labs (all labs ordered are listed, but only abnormal results are displayed) Labs Reviewed  CBC WITH DIFFERENTIAL/PLATELET - Abnormal; Notable for the following components:      Result Value   Platelets 137 (*)    All other components within normal limits  URINALYSIS, ROUTINE W REFLEX MICROSCOPIC - Abnormal; Notable for the following components:   Hgb urine dipstick MODERATE (*)    All other components within normal limits  ACETAMINOPHEN LEVEL - Abnormal; Notable for the following components:   Acetaminophen (Tylenol), Serum <10 (*)    All other components within normal limits  SALICYLATE LEVEL - Abnormal; Notable for the following components:   Salicylate Lvl [id number] (*)    All other components within normal limits  COMPREHENSIVE METABOLIC PANEL - Abnormal; Notable for the following components:   Glucose, Bld 119 (*)    Calcium 8.5 (*)    AST 61 (*)    All other components within normal limits  CBC - Abnormal; Notable for the following components:   Hemoglobin 12.7 (*)    HCT 38.2 (*)    Platelets 65 (*)    All other components within normal limits  COMPREHENSIVE METABOLIC PANEL - Abnormal; Notable for the following components:   Glucose, Bld 121 (*)    Calcium 8.8 (*)    Total Protein 6.2 (*)    All other components within normal limits  RESP PANEL BY RT-PCR (FLU A&B, COVID) ARPGX2  RAPID URINE DRUG SCREEN, HOSP PERFORMED  ETHANOL  CK  HIV ANTIBODY (ROUTINE TESTING W REFLEX)  MAGNESIUM  PHOSPHORUS  CALCIUM, IONIZED    EKG EKG Interpretation  Date/Time:  Monday June 30 2020 14:29:29 EDT Ventricular Rate:  95 PR Interval:  178 QRS Duration: 108 QT Interval:  379 QTC Calculation: 477 R Axis:   -36 Text  Interpretation: Sinus rhythm Left axis deviation Nonspecific T abnormalities, anterior leads qt <500 No significant change since last tracing Confirmed by Deno Etienne [phone]) on 06/30/2020 2:36:23 PM   Radiology DG Chest 1 View  Result Date: 06/30/2020 CLINICAL DATA:  60 year old male with overdose. EXAM: CHEST  1 VIEW COMPARISON:  Chest radiograph dated 03/22/2020. FINDINGS: Shallow inspiration with minimal bibasilar atelectasis. No focal consolidation, pleural effusion, pneumothorax. Mild cardiomegaly. No acute osseous pathology. Lower cervical ACDF. IMPRESSION: No active cardiopulmonary disease. Electronically Signed   By: Anner Crete M.D.   On: 06/30/2020 16:55   CT HEAD WO CONTRAST  Result Date: 06/30/2020 CLINICAL DATA:  Head trauma, abnormal mental status (Age 11-64y) EXAM: CT HEAD WITHOUT CONTRAST TECHNIQUE: Contiguous axial images were obtained from the base of the skull through the vertex without intravenous contrast. COMPARISON:  Head CT 03/22/2020 FINDINGS: Brain: Stable brain volume. No intracranial hemorrhage, mass effect, or midline shift. No hydrocephalus. The basilar cisterns are patent. No evidence of territorial infarct or acute ischemia. No extra-axial or intracranial fluid collection. Vascular: No hyperdense vessel. Skull: No fracture or focal lesion. Sinuses/Orbits: Paranasal sinuses and mastoid air cells are clear. The visualized orbits are unremarkable. Other: None. IMPRESSION: No acute intracranial abnormality. No skull fracture. Electronically Signed   By: Keith Rake M.D.   On: 06/30/2020 17:20   CT CERVICAL SPINE WO CONTRAST  Result Date: 06/30/2020 CLINICAL DATA:  Neck trauma, intoxicated or obtunded (Age >= 16y) EXAM: CT CERVICAL SPINE WITHOUT CONTRAST TECHNIQUE: Multidetector CT imaging of the cervical spine was performed without intravenous contrast. Multiplanar CT image reconstructions were also generated. COMPARISON:  Radiograph 01/11/2018 FINDINGS: Alignment:  Postsurgical straightening.  No acute malalignment. Skull base and vertebrae: Multilevel anterior fusion with varying hardware from C3 through C7. Hardware is unchanged in position from prior radiograph. No acute fracture dens is intact. Skull base is intact. Soft tissues and spinal canal: No prevertebral fluid or swelling. No visible canal hematoma. Disc levels: Fusion C3-C4 through C6-C7. No high-grade canal stenosis. Upper chest: No acute or unexpected findings. Other: Carotid calcifications. IMPRESSION: 1. No acute fracture or subluxation of the cervical spine. 2. Multilevel surgical fusion, stable from prior imaging. Electronically Signed   By: Keith Rake M.D.   On: 06/30/2020 17:23    Procedures Procedures   Medications Ordered in ED Medications  lactated ringers infusion ( Intravenous Stopped 06/30/20 1700)  sodium chloride flush (NS) 0.9 % injection 3 mL (3 mLs Intravenous Given 07/01/20 0914)  albuterol (PROVENTIL) (2.5 MG/3ML) 0.083% nebulizer solution 2.5 mg (has no administration in time range)  LORazepam (ATIVAN) tablet 1-4 mg ( Oral See Alternative 07/01/20 1449)    Or  LORazepam (ATIVAN) injection 1-4 mg (4 mg Intravenous Given 07/01/20 1449)  thiamine tablet 100 mg (0 mg Oral Hold 06/30/20 1757)    Or  thiamine (B-1) injection 100 mg ( Intravenous See Alternative 7/56/43 3295)  folic acid (FOLVITE) tablet 1 mg (0 mg Oral Hold 06/30/20 1758)  multivitamin with minerals tablet 1 tablet (0 tablets Oral Hold 06/30/20 1758)  LORazepam (ATIVAN) injection 0-4 mg (0 mg Intravenous Not Given 07/01/20 0913)    Followed by  LORazepam (ATIVAN) injection 0-4 mg (has no administration in time range)  traZODone (DESYREL) tablet 150 mg (150 mg Oral Given 06/30/20 2320)  naphazoline-pheniramine (NAPHCON-A) 0.025-0.3 % ophthalmic solution 1 drop (has no administration in time range)  lipase/protease/amylase (CREON) capsule 36,000-72,000 Units (72,000 Units Oral Given 07/01/20 1322)  Chlorhexidine  Gluconate Cloth 2 % PADS 6 each (6 each Topical Given 07/01/20 0912)  sertraline (ZOLOFT) tablet 100 mg (has no administration in time range)  sodium chloride 0.9 % bolus 1,000 mL (0 mLs Intravenous Stopped 06/30/20 1152)  LORazepam (ATIVAN) injection 0.5 mg (0.5 mg Intravenous Given 06/30/20 0938)  LORazepam (ATIVAN) injection 0.5 mg (0.5 mg Intravenous Given 06/30/20 1204)  sodium chloride 0.9 % 1,000 mL with thiamine 188 mg, folic acid 1 mg, multivitamins adult 10 mL infusion ( Intravenous Stopped 07/01/20 0700)  calcium gluconate 2 g/ 100 mL sodium chloride IVPB (0 mg Intravenous Stopped 07/01/20 0000)  lidocaine (XYLOCAINE) 2 % jelly 1 application (1 application Urethral Given 07/01/20 0300)    ED Course  I have reviewed the triage vital signs and the nursing notes.  Pertinent labs & imaging results that were available during my care of the patient were reviewed by me and considered in my medical decision making (see chart for details).    MDM Rules/Calculators/A&P                          60yo M with a chief complaints of an intentional drug overdose of Lyrica trazodone and alcohol.  This occurred about 16 hours ago.  Started having significant tremors this morning.  Patient clinically with a anticholinergic toxidrome.  Will give IV fluids.  Check blood work.  Discussed with poison control. Recommended obs in the ED.  Patient with continued symptoms.  I discussed the case with Danielle at poison control, at this point patient symptomatic for about 20 hours, unpredictable half life with multiple ingestions, recommends admission until asymptomatic.  Repeat ecg with  continued tachycardia, fluids and benzos as needed.    CRITICAL CARE Performed by: Cecilio Asper   Total critical care time: 80 minutes  Critical care time was exclusive of separately billable procedures and treating other patients.  Critical care was necessary to treat or prevent imminent or life-threatening  deterioration.  Critical care was time spent personally by me on the following activities: development of treatment plan with patient and/or surrogate as well as nursing, discussions with consultants, evaluation of patient's response to treatment, examination of patient, obtaining history from patient or surrogate, ordering and performing treatments and interventions, ordering and review of laboratory studies, ordering and review of radiographic studies, pulse oximetry and re-evaluation of patient's condition.  The patients results and plan were reviewed and discussed.   Any x-rays performed were independently reviewed by myself.   Differential diagnosis were considered with the presenting HPI.  Medications  lactated ringers infusion ( Intravenous Stopped 06/30/20 1700)  sodium chloride flush (NS) 0.9 % injection 3 mL (3 mLs Intravenous Given 07/01/20 0914)  albuterol (PROVENTIL) (2.5 MG/3ML) 0.083% nebulizer solution 2.5 mg (has no administration in time range)  LORazepam (ATIVAN) tablet 1-4 mg ( Oral See Alternative 07/01/20 1449)    Or  LORazepam (ATIVAN) injection 1-4 mg (4 mg Intravenous Given 07/01/20 1449)  thiamine tablet 100 mg (0 mg Oral Hold 06/30/20 1757)    Or  thiamine (B-1) injection 100 mg ( Intravenous See Alternative 2/84/13 2440)  folic acid (FOLVITE) tablet 1 mg (0 mg Oral Hold 06/30/20 1758)  multivitamin with minerals tablet 1 tablet (0 tablets Oral Hold 06/30/20 1758)  LORazepam (ATIVAN) injection 0-4 mg (0 mg Intravenous Not Given 07/01/20 0913)    Followed by  LORazepam (ATIVAN) injection 0-4 mg (has no administration in time range)  traZODone (DESYREL) tablet 150 mg (150 mg Oral Given 06/30/20 2320)  naphazoline-pheniramine (NAPHCON-A) 0.025-0.3 % ophthalmic solution 1 drop (has no administration in time range)  lipase/protease/amylase (CREON) capsule 36,000-72,000 Units (72,000 Units Oral Given 07/01/20 1322)  Chlorhexidine Gluconate Cloth 2 % PADS 6 each (6 each Topical  Given 07/01/20 0912)  sertraline (ZOLOFT) tablet 100 mg (has no administration in time range)  sodium chloride 0.9 % bolus 1,000 mL (0 mLs Intravenous Stopped 06/30/20 1152)  LORazepam (ATIVAN) injection 0.5 mg (0.5 mg Intravenous Given 06/30/20 0938)  LORazepam (ATIVAN) injection 0.5 mg (0.5 mg Intravenous Given 06/30/20 1204)  sodium chloride 0.9 % 1,000 mL with thiamine 102 mg, folic acid 1 mg, multivitamins adult 10 mL infusion ( Intravenous Stopped 07/01/20 0700)  calcium gluconate 2 g/ 100 mL sodium chloride IVPB (0 mg Intravenous Stopped 07/01/20 0000)  lidocaine (XYLOCAINE) 2 % jelly 1 application (1 application Urethral Given 07/01/20 0300)    Vitals:   06/30/20 2118 06/30/20 2300 07/01/20 0400 07/01/20 1444  BP: 135/85 127/86 123/79 (!) 141/91  Pulse: 84 80 84   Resp:  15 14 (!) 21  Temp: (!) 97.5 F (36.4 C) (!) 97.4 F (36.3 C) 98.2 F (36.8 C)   TempSrc: Oral Oral Oral   SpO2: 93% 94% 94%   Weight:      Height:        Final diagnoses:  Hypoxia    Admission/ observation were discussed with the admitting physician, patient and/or family and they are comfortable with the plan.    Final Clinical Impression(s) / ED Diagnoses Final diagnoses:  Hypoxia    Rx / DC Orders ED Discharge Orders    None  Deno Etienne, DO 07/01/20 1507

## 2020-06-30 NOTE — Progress Notes (Signed)
Patient arrived on unit via bed, accompanied by transport. Patient transferred to bed without difficulty. NAD noted at this time. Sitter at bedside, suiside precautions done in room.

## 2020-06-30 NOTE — ED Triage Notes (Signed)
Pt BIBA from home for OD. Pt involved in argument with his spouse and decided to take approx 30, 150mg  tablets of Trazadone and approx 30, 150mg  tablets of Pregablin mixed with alcohol (unknown amount). Pt has significant tremors assessed to extremities. Patent airway. Resp even and unlabored.

## 2020-06-30 NOTE — ED Notes (Signed)
Belongings inventoried and placed in [place] [facility]. Security wanded patient. Sitter at bedside.

## 2020-07-01 DIAGNOSIS — R339 Retention of urine, unspecified: Secondary | ICD-10-CM | POA: Diagnosis present

## 2020-07-01 DIAGNOSIS — Z85828 Personal history of other malignant neoplasm of skin: Secondary | ICD-10-CM | POA: Diagnosis not present

## 2020-07-01 DIAGNOSIS — R0902 Hypoxemia: Secondary | ICD-10-CM | POA: Diagnosis present

## 2020-07-01 DIAGNOSIS — R7989 Other specified abnormal findings of blood chemistry: Secondary | ICD-10-CM | POA: Diagnosis not present

## 2020-07-01 DIAGNOSIS — T43212A Poisoning by selective serotonin and norepinephrine reuptake inhibitors, intentional self-harm, initial encounter: Secondary | ICD-10-CM | POA: Diagnosis present

## 2020-07-01 DIAGNOSIS — G4700 Insomnia, unspecified: Secondary | ICD-10-CM | POA: Diagnosis present

## 2020-07-01 DIAGNOSIS — F1120 Opioid dependence, uncomplicated: Secondary | ICD-10-CM | POA: Diagnosis present

## 2020-07-01 DIAGNOSIS — K861 Other chronic pancreatitis: Secondary | ICD-10-CM | POA: Diagnosis present

## 2020-07-01 DIAGNOSIS — X838XXA Intentional self-harm by other specified means, initial encounter: Secondary | ICD-10-CM | POA: Diagnosis present

## 2020-07-01 DIAGNOSIS — R338 Other retention of urine: Secondary | ICD-10-CM | POA: Diagnosis not present

## 2020-07-01 DIAGNOSIS — F1010 Alcohol abuse, uncomplicated: Secondary | ICD-10-CM | POA: Diagnosis not present

## 2020-07-01 DIAGNOSIS — Z888 Allergy status to other drugs, medicaments and biological substances status: Secondary | ICD-10-CM | POA: Diagnosis not present

## 2020-07-01 DIAGNOSIS — G894 Chronic pain syndrome: Secondary | ICD-10-CM | POA: Diagnosis present

## 2020-07-01 DIAGNOSIS — K7010 Alcoholic hepatitis without ascites: Secondary | ICD-10-CM | POA: Diagnosis present

## 2020-07-01 DIAGNOSIS — F419 Anxiety disorder, unspecified: Secondary | ICD-10-CM | POA: Diagnosis not present

## 2020-07-01 DIAGNOSIS — E785 Hyperlipidemia, unspecified: Secondary | ICD-10-CM | POA: Diagnosis present

## 2020-07-01 DIAGNOSIS — T50912A Poisoning by multiple unspecified drugs, medicaments and biological substances, intentional self-harm, initial encounter: Secondary | ICD-10-CM | POA: Diagnosis not present

## 2020-07-01 DIAGNOSIS — Z87891 Personal history of nicotine dependence: Secondary | ICD-10-CM | POA: Diagnosis not present

## 2020-07-01 DIAGNOSIS — F10239 Alcohol dependence with withdrawal, unspecified: Secondary | ICD-10-CM | POA: Diagnosis not present

## 2020-07-01 DIAGNOSIS — D696 Thrombocytopenia, unspecified: Secondary | ICD-10-CM | POA: Diagnosis present

## 2020-07-01 DIAGNOSIS — Z7982 Long term (current) use of aspirin: Secondary | ICD-10-CM | POA: Diagnosis not present

## 2020-07-01 DIAGNOSIS — Z20822 Contact with and (suspected) exposure to covid-19: Secondary | ICD-10-CM | POA: Diagnosis present

## 2020-07-01 DIAGNOSIS — Z981 Arthrodesis status: Secondary | ICD-10-CM | POA: Diagnosis not present

## 2020-07-01 DIAGNOSIS — F32A Depression, unspecified: Secondary | ICD-10-CM | POA: Diagnosis present

## 2020-07-01 DIAGNOSIS — Z79899 Other long term (current) drug therapy: Secondary | ICD-10-CM | POA: Diagnosis not present

## 2020-07-01 DIAGNOSIS — F411 Generalized anxiety disorder: Secondary | ICD-10-CM | POA: Diagnosis present

## 2020-07-01 DIAGNOSIS — Z882 Allergy status to sulfonamides status: Secondary | ICD-10-CM | POA: Diagnosis not present

## 2020-07-01 LAB — COMPREHENSIVE METABOLIC PANEL
ALT: 33 U/L (ref 0–44)
AST: 39 U/L (ref 15–41)
Albumin: 3.6 g/dL (ref 3.5–5.0)
Alkaline Phosphatase: 45 U/L (ref 38–126)
Anion gap: 9 (ref 5–15)
BUN: 12 mg/dL (ref 8–23)
CO2: 26 mmol/L (ref 22–32)
Calcium: 8.8 mg/dL — ABNORMAL LOW (ref 8.9–10.3)
Chloride: 105 mmol/L (ref 98–111)
Creatinine, Ser: 0.98 mg/dL (ref 0.61–1.24)
GFR, Estimated: >60 mL/min (ref >60)
Glucose, Bld: 121 mg/dL — ABNORMAL HIGH (ref 70–99)
Potassium: 3.9 mmol/L (ref 3.5–5.1)
Sodium: 140 mmol/L (ref 135–145)
Total Bilirubin: 0.9 mg/dL (ref 0.3–1.2)
Total Protein: 6.2 g/dL — ABNORMAL LOW (ref 6.5–8.1)

## 2020-07-01 LAB — CBC
HCT: 38.2 % — ABNORMAL LOW (ref 39.0–52.0)
Hemoglobin: 12.7 g/dL — ABNORMAL LOW (ref 13.0–17.0)
MCH: 29.6 pg (ref 26.0–34.0)
MCHC: 33.2 g/dL (ref 30.0–36.0)
MCV: 89 fL (ref 80.0–100.0)
Platelets: 65 10*3/uL — ABNORMAL LOW (ref 150–400)
RBC: 4.29 MIL/uL (ref 4.22–5.81)
RDW: 13.2 % (ref 11.5–15.5)
WBC: 5.3 10*3/uL (ref 4.0–10.5)
nRBC: 0 % (ref 0.0–0.2)

## 2020-07-01 LAB — PHOSPHORUS: Phosphorus: 3.1 mg/dL (ref 2.5–4.6)

## 2020-07-01 LAB — MAGNESIUM: Magnesium: 2 mg/dL (ref 1.7–2.4)

## 2020-07-01 MED ORDER — CHLORHEXIDINE GLUCONATE CLOTH 2 % EX PADS
6.0000 | MEDICATED_PAD | Freq: Every day | CUTANEOUS | Status: DC
  Administered 2020-07-01 – 2020-07-04 (×4): 6 via TOPICAL

## 2020-07-01 MED ORDER — HALOPERIDOL LACTATE 5 MG/ML IJ SOLN
2.0000 mg | Freq: Four times a day (QID) | INTRAMUSCULAR | Status: DC | PRN
  Administered 2020-07-01 – 2020-07-02 (×2): 2 mg via INTRAVENOUS
  Filled 2020-07-01 (×2): qty 1

## 2020-07-01 MED ORDER — SERTRALINE HCL 100 MG PO TABS
100.0000 mg | ORAL_TABLET | Freq: Every day | ORAL | Status: DC
  Administered 2020-07-01 – 2020-07-04 (×4): 100 mg via ORAL
  Filled 2020-07-01 (×4): qty 1

## 2020-07-01 NOTE — Progress Notes (Signed)
Patient NPO, no swallow screen charted and no upcoming procedures noted. Patient inquiring about eating and drinking. Spoke with Dr. Cyd Silence, orders received to complete swallow study, if passed patient can eat/drink. Swallow study completed and charted by this RN. Patient has passed swallow study. Diet order received, will continue to monitor.

## 2020-07-01 NOTE — Progress Notes (Signed)
Patient's CIWA score is 23. Called rapid response, notified MD. Administered 4mg  of ativan per protocol. Will reassess within an hour.

## 2020-07-01 NOTE — Progress Notes (Addendum)
Patient's ciwa score is 18. Administered 3mg  ativan per protocol. MD is aware.

## 2020-07-01 NOTE — Progress Notes (Signed)
Patient's ciwa went from 75 to 54. Notified MD, administered 4mg  of ativan and 2mg  of haldol. Will continue to monitor.

## 2020-07-01 NOTE — Progress Notes (Signed)
#  16FR coude catheter inserted without difficulty with 1000 ml return of clear yellow urine. Foley secured with bard holder. Pt tolerated well. Charge RN aware of output.

## 2020-07-01 NOTE — Consult Note (Addendum)
[facility] Consult   Reason for Consult:  Suicidal attempt Referring Physician: Fuller Plan Patient Identification: Leonard [name] MRN:  [id number] Principal Diagnosis: Suicide attempt by multiple drug overdose, initial encounter [facility]) Diagnosis:  Principal Problem:   Suicide attempt by multiple drug overdose, initial encounter [facility]) Active Problems:   Anxiety and depression   Elevated LFTs   Alcohol abuse   Acute urinary retention   Fall at home, initial encounter   Hypocalcemia   Total Time spent with patient: 45 minutes  Subjective:  "I am fed up" HPI: Leonard [name] is a 60 y.o. male patient with past medical history of anxiety, depression, chronic pancreatitis, h/o alcohol and opioid abuse and chronic back pain admitted with suicide attempt by taking 30 pills of Lyrica and trazodone each with alcohol after having an argument with his wife.  Psych consult was placed because of self-harm. Patient seen and examined.  Chart reviewed.  Patient is poor historian.  On evaluation patient is confused with tangential thought process and perseveration.  His speech is slurred.  Patient states he tried to take his life because he is fed up as his wife wants to get separated after 23 years of marriage.  He states he is in big financial trouble because he has not been paying his taxes since 2015.  Patient states he has been depressed for a long time  currently endorsing active suicidal thoughts with no plan.  Patient denies HI, hallucinations, paranoia.  Patient endorses depressed and sad mood for a long time, poor sleep, fatigue, low energy, hopelessness helplessness, worthlessness, poor memory, racing thoughts, and irritability.  He denies changes in appetite, anhedonia, feeling guilty, poor concentration.  Patient endorses generalized anxiety.  Patient states he had some auditory hallucinations in the past but not right now.  Patient denies any previous psychiatric  hospitalizations.  Patient states he has been taking Zoloft 75 mg daily and its been helping him.  He states he stopped taking Lyrica because it does not help him.  He denies any previous suicidal attempt. He denies any self harm behaviors in the past.  He states he had guns at home but his wife took it away.  He denies any problem with the law enforcement.  He states he drinks couple of beers in the afternoon every day.  He states he binge drinks vodka sometimes.  He denies using any other drugs.  He states he had issues with opiates and alcohol in the past and required rehab in 2015.  On examination, patient is alert, oriented to self and place but not to time.  He states he is 66 and then states is 2023.  He is confused with tangential thought process and perseveration.  His speech is slurred.  His mood is depressed and hopeless and affect is depressed.  Endorses SI without plan, denies HI, hallucinations.  Collateral from wife Leonard [name] @[phone] -wife states that he has been feeling depressed for a long time and has been going through a lot of stress recently because he was denied SS benefits as he did not pay taxes since 2015.  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  Wife states that she told him that she wanted to separate and sell the house and separate furniture that put him on the edge.  She states she removed all the guns from the house and put it away.  She states he was asking about guns  on the other day and when she told him that she removed the guns to which he replied that he has enough pills to do it. He then went to room and closed the door and opened it after some time and she saw pills lying around.  At that time she called EMS and he told them that he took 30 pills of trazodone. She states that he has been to rehab in 2015 at [facility] for alcohol and opioid addiction. She denies any previous psychiatric hospitalization or suicidal  attempt.  She states that he has been binge drinking vodka for last 3 weeks.  Labs reviewed-electrolytes within normal limit, glucose 121, hemoglobin 12.7, QTc 471, U tox negative, alcohol level less than 10, salicylate level less than 7.  AST 39, ALT 33, WBC is 5.3  Past Psychiatric History: Depression, anxiety.  Alcohol abuse  Risk to Self:  Yes Risk to Others:  No Prior Inpatient Therapy:  No Prior Outpatient Therapy:  No  Past Medical History:  Past Medical History:  Diagnosis Date  . Abnormal EKG    anterolateral T wave inversion  . Anxiety   . Arthritis    "lower back, maybe some in my neck" (08/05/2017)  . 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 Chronic lower back pain    spondylolisthesis,spondylosis,DDD,stenosis  . Depression   . Dizziness   . Hyperlipidemia    was on meds but has been off x 5 months d/t elevated liver enzymes  . Hyperlipidemia    statin intolerant because of elevated liver function tests  . Iliac artery aneurysm, bilateral ([facility])    atable at 2.2 cm 07/28/17  . Insomnia    takes Trazodone nightly  . Numbness and tingling of both legs   . Opiate addiction ([place])   . Pancreatitis 08/03/2017  . PONV (postoperative nausea and vomiting)   . Skin cancer    "forehead; right posterior neck"  . Weakness    pain and occasionally tingling in right arm and leg    Past Surgical History:  Procedure Laterality Date  . ANTERIOR CERVICAL DECOMP/DISCECTOMY FUSION  2003, 2007   x 2  . ANTERIOR CERVICAL DECOMP/DISCECTOMY FUSION N/A 10/13/2017   Procedure: Cervical Four-Five Cervical Five-Six Anterior cervical decompression/discectomy/fusion with exploration of Cervical Three-Four, Cervical Six-Seven fusions/possible removal of hardware;  Surgeon: Erline Levine, MD;  Location: [facility];  Service: Neurosurgery;  Laterality: N/A;  Cervical Four-Five Cervical Five-Six Anterior cervical decompression/discectomy/fusion with exploration of Cervical  . ANTERIOR LAT LUMBAR  FUSION Right 09/29/2018   Procedure: Right Lumbar three-lumbar four Anterolateral lumbar interbody fusion with lateral plate;  Surgeon: Erline Levine, MD;  Location: [facility];  Service: Neurosurgery;  Laterality: Right;  . APPENDECTOMY  as a child  . BACK SURGERY    . BILIARY STENT PLACEMENT N/A 03/21/2019   Procedure: BILIARY STENT PLACEMENT;  Surgeon: Clarene Essex, MD;  Location: [facility];  Service: Endoscopy;  Laterality: N/A;  . COLONOSCOPY    . ERCP N/A 03/21/2019   Procedure: ENDOSCOPIC RETROGRADE CHOLANGIOPANCREATOGRAPHY (ERCP) with stent placement;  Surgeon: Clarene Essex, MD;  Location: [facility];  Service: Endoscopy;  Laterality: N/A;  . KNEE ARTHROSCOPY Right 2011  . LUMBAR LAMINECTOMY  2014   L4-5  . MOHS SURGERY     "forehead; right posterior neck"  . POSTERIOR LUMBAR FUSION  2015  . radial nerve impingement Right 2009   "near elbor"  . SPHINCTEROTOMY  03/21/2019   Procedure: SPHINCTEROTOMY;  Surgeon: Clarene Essex, MD;  Location: [facility];  Service: Endoscopy;;  balloon sweep  . TIBIA FRACTURE SURGERY Right as a child   compound fracture   Family History:  Family History  Problem Relation Age of Onset  . Arthritis Mother   . Cirrhosis Father   . Stroke Neg Hx    Family Psychiatric  History: None reported. Social History:  Social History   Substance and Sexual Activity  Alcohol Use Not Currently  . Alcohol/week: 84.0 standard drinks  . Types: 84 Glasses of wine per week   Comment: 08/05/2017 "2 bottles of red wine/night", 09/26/2018 not currently drinking alcohol     Social History   Substance and Sexual Activity  Drug Use Not Currently    Social History   Socioeconomic History  . Marital status: Married    Spouse name: Not on file  . Number of children: 0  . Years of education: 23  . Highest education level: Not on file  Occupational History  . Occupation: [employer]  Tobacco Use  . Smoking status: Former Smoker    Packs/day: 0.50     Years: 2.00    Pack years: 1.00    Types: Cigarettes  . Smokeless tobacco: Former Systems developer    Types: Snuff    Quit date: 09/14/2017  . Tobacco comment: smoked while he was in high school;   Vaping Use  . Vaping Use: Never used  Substance and Sexual Activity  . Alcohol use: Not Currently    Alcohol/week: 84.0 standard drinks    Types: 84 Glasses of wine per week    Comment: 08/05/2017 "2 bottles of red wine/night", 09/26/2018 not currently drinking alcohol  . Drug use: Not Currently  . Sexual activity: Yes  Other Topics Concern  . Not on file  Social History Narrative   ** Merged History Encounter **   Lives at home w/ his wife   Right-handed   Caffeine: 3 cups coffee per day   Social Determinants of Health   Financial Resource Strain: Not on file  Food Insecurity: Not on file  Transportation Needs: Not on file  Physical Activity: Not on file  Stress: Not on file  Social Connections: Not on file   Additional Social History:    Allergies:   Allergies  Allergen Reactions  . Duloxetine Other (See Comments)    Suicidal thoughts  . Sulfa Antibiotics Hives, Swelling, Rash and Other (See Comments)    Face swells and this class of meds caused welts, also (Substance With Sulfonamide Structure And Antibacterial Mechanism Of Action)  . Zocor [Simvastatin] Other (See Comments)    Elevated his liver enzymes    Labs:  Results for orders placed or performed during the hospital encounter of 06/30/20 (from the past 48 hour(s))  CBC with Diff     Status: Abnormal   Collection Time: 06/30/20  9:14 AM  Result Value Ref Range   WBC 7.2 4.0 - 10.5 K/uL   RBC 5.12 4.22 - 5.81 MIL/uL   Hemoglobin 15.2 13.0 - 17.0 g/dL   HCT 45.5 39.0 - 52.0 %   MCV 88.9 80.0 - 100.0 fL   MCH 29.7 26.0 - 34.0 pg   MCHC 33.4 30.0 - 36.0 g/dL   RDW 13.3 11.5 - 15.5 %   Platelets 137 (L) 150 - 400 K/uL   nRBC 0.0 0.0 - 0.2 %   Neutrophils Relative % 78 %   Neutro Abs 5.5 1.7 - 7.7 K/uL   Lymphocytes  Relative 16 %  Lymphs Abs 1.2 0.7 - 4.0 K/uL   Monocytes Relative 6 %   Monocytes Absolute 0.4 0.1 - 1.0 K/uL   Eosinophils Relative 0 %   Eosinophils Absolute 0.0 0.0 - 0.5 K/uL   Basophils Relative 0 %   Basophils Absolute 0.0 0.0 - 0.1 K/uL   Immature Granulocytes 0 %   Abs Immature Granulocytes 0.02 0.00 - 0.07 K/uL    Comment: Performed at [facility], [place] [address] [place], [place] [address]  Resp Panel by RT-PCR (Flu A&B, Covid) Nasopharyngeal Swab     Status: None   Collection Time: 06/30/20  9:16 AM   Specimen: Nasopharyngeal Swab; Nasopharyngeal(NP) swabs in vial transport medium  Result Value Ref Range   SARS Coronavirus 2 by RT PCR NEGATIVE NEGATIVE    Comment: (NOTE) SARS-CoV-2 target nucleic acids are NOT DETECTED.  The SARS-CoV-2 RNA is generally detectable in upper respiratory specimens during the acute phase of infection. The lowest concentration of SARS-CoV-2 viral copies this assay can detect is 138 copies/mL. A negative result does not preclude SARS-Cov-2 infection and should not be used as the sole basis for treatment or other patient management decisions. A negative result may occur with  improper specimen collection/handling, submission of specimen other than nasopharyngeal swab, presence of viral mutation(s) within the areas targeted by this assay, and inadequate number of viral copies(<138 copies/mL). A negative result must be combined with clinical observations, patient history, and epidemiological information. The expected result is Negative.  Fact Sheet for Patients:  EntrepreneurPulse.com.au  Fact Sheet for Healthcare Providers:  IncredibleEmployment.be  This test is no t yet approved or cleared by the [place] FDA and  has been authorized for detection and/or diagnosis of SARS-CoV-2 by FDA under an Emergency Use Authorization (EUA). This EUA will remain  in effect (meaning this test can be used)  for the duration of the COVID-19 declaration under Section 564(b)(1) of the Act, 21 U.S.C.section 360bbb-3(b)(1), unless the authorization is terminated  or revoked sooner.       Influenza A by PCR NEGATIVE NEGATIVE   Influenza B by PCR NEGATIVE NEGATIVE    Comment: (NOTE) The Xpert Xpress SARS-CoV-2/FLU/RSV plus assay is intended as an aid in the diagnosis of influenza from Nasopharyngeal swab specimens and should not be used as a sole basis for treatment. Nasal washings and aspirates are unacceptable for Xpert Xpress SARS-CoV-2/FLU/RSV testing.  Fact Sheet for Patients: EntrepreneurPulse.com.au  Fact Sheet for Healthcare Providers: IncredibleEmployment.be  This test is not yet approved or cleared by the [place] FDA and has been authorized for detection and/or diagnosis of SARS-CoV-2 by FDA under an Emergency Use Authorization (EUA). This EUA will remain in effect (meaning this test can be used) for the duration of the COVID-19 declaration under Section 564(b)(1) of the Act, 21 U.S.C. section 360bbb-3(b)(1), unless the authorization is terminated or revoked.  Performed at [facility], [place] [address], [place], [place] [address]   Acetaminophen level     Status: Abnormal   Collection Time: 06/30/20 10:57 AM  Result Value Ref Range   Acetaminophen (Tylenol), Serum <10 (L) 10 - 30 ug/mL    Comment: (NOTE) Therapeutic concentrations vary significantly. A range of 10-30 ug/mL  may be an effective concentration for many patients. However, some  are best treated at concentrations outside of this range. Acetaminophen concentrations >150 ug/mL at 4 hours after ingestion  and >50 ug/mL at 12 hours after ingestion are often associated with  toxic reactions.  Performed at [facility], [place] [address], [place], [place] [id number]   Salicylate level     Status: Abnormal   Collection Time: 06/30/20 10:57 AM  Result Value Ref  Range   Salicylate Lvl [id number] (L) 7.0 - 30.0 mg/dL    Comment: Performed at [facility] [address], [place], [place] [address]  Comprehensive metabolic panel     Status: Abnormal   Collection Time: 06/30/20 10:57 AM  Result Value Ref Range   Sodium 142 135 - 145 mmol/L   Potassium 5.0 3.5 - 5.1 mmol/L   Chloride 105 98 - 111 mmol/L   CO2 26 22 - 32 mmol/L   Glucose, Bld 119 (H) 70 - 99 mg/dL    Comment: Glucose reference range applies only to samples taken after fasting for at least 8 hours.   BUN 12 8 - 23 mg/dL   Creatinine, Ser 1.06 0.61 - 1.24 mg/dL   Calcium 8.5 (L) 8.9 - 10.3 mg/dL   Total Protein 7.1 6.5 - 8.1 g/dL   Albumin 4.0 3.5 - 5.0 g/dL   AST 61 (H) 15 - 41 U/L   ALT 42 0 - 44 U/L   Alkaline Phosphatase 46 38 - 126 U/L   Total Bilirubin 0.9 0.3 - 1.2 mg/dL   GFR, Estimated >60 >60 mL/min    Comment: (NOTE) Calculated using the CKD-EPI Creatinine Equation (2021)    Anion gap 11 5 - 15    Comment: Performed at [facility], [place] [address] [place] [address], [place], [place] [address]  Ethanol     Status: None   Collection Time: 06/30/20 10:57 AM  Result Value Ref Range   Alcohol, Ethyl (B) <10 <10 mg/dL    Comment: (NOTE) Lowest detectable limit for serum alcohol is 10 mg/dL.  For medical purposes only. Performed at [facility], [place] [address], [place], [place] [address]   CK     Status: None   Collection Time: 06/30/20 10:57 AM  Result Value Ref Range   Total CK 239 49 - 397 U/L    Comment: Performed at [facility], [place] [address], [place], [place] [address]  Urine rapid drug screen (hosp performed)     Status: None   Collection Time: 06/30/20  5:27 PM  Result Value Ref Range   Opiates NONE DETECTED NONE DETECTED   Cocaine NONE DETECTED NONE DETECTED   Benzodiazepines NONE DETECTED NONE DETECTED   Amphetamines NONE DETECTED NONE DETECTED   Tetrahydrocannabinol NONE DETECTED NONE DETECTED   Barbiturates NONE DETECTED NONE DETECTED     Comment: (NOTE) DRUG SCREEN FOR MEDICAL PURPOSES ONLY.  IF CONFIRMATION IS NEEDED FOR ANY PURPOSE, NOTIFY LAB WITHIN 5 DAYS.  LOWEST DETECTABLE LIMITS FOR URINE DRUG SCREEN Drug Class                     Cutoff (ng/mL) Amphetamine and metabolites    1000 Barbiturate and metabolites    200 Benzodiazepine                 [id number] Tricyclics and metabolites     300 Opiates and metabolites        300 Cocaine and metabolites        300 THC                            50 Performed at [facility], [place]  [address] [place], [place] [address]   Urinalysis, Routine w reflex microscopic Urine, Catheterized     Status: Abnormal   Collection Time: 06/30/20  5:27 PM  Result Value Ref Range   Color, Urine YELLOW YELLOW   APPearance CLEAR CLEAR   Specific Gravity, Urine 1.012 1.005 - 1.030   pH 5.0 5.0 - 8.0   Glucose, UA NEGATIVE NEGATIVE mg/dL   Hgb urine dipstick MODERATE (A) NEGATIVE   Bilirubin Urine NEGATIVE NEGATIVE   Ketones, ur NEGATIVE NEGATIVE mg/dL   Protein, ur NEGATIVE NEGATIVE mg/dL   Nitrite NEGATIVE NEGATIVE   Leukocytes,Ua NEGATIVE NEGATIVE   RBC / HPF 0-5 0 - 5 RBC/hpf   WBC, UA 0-5 0 - 5 WBC/hpf   Bacteria, UA NONE SEEN NONE SEEN   Mucus PRESENT    Hyaline Casts, UA PRESENT     Comment: Performed at [facility] [address] [place], [place] [address]  HIV Antibody (routine testing w rflx)     Status: None   Collection Time: 06/30/20  8:47 PM  Result Value Ref Range   HIV Screen 4th Generation wRfx Non Reactive Non Reactive    Comment: Performed at [facility], [place] [address], [place], [place] [address]  CBC     Status: Abnormal   Collection Time: 07/01/20 12:29 AM  Result Value Ref Range   WBC 5.3 4.0 - 10.5 K/uL   RBC 4.29 4.22 - 5.81 MIL/uL   Hemoglobin 12.7 (L) 13.0 - 17.0 g/dL   HCT 38.2 (L) 39.0 - 52.0 %   MCV 89.0 80.0 - 100.0 fL   MCH 29.6 26.0 - 34.0 pg   MCHC 33.2 30.0 - 36.0 g/dL   RDW 13.2 11.5 - 15.5 %   Platelets 65 (L) 150 -  400 K/uL    Comment: SPECIMEN CHECKED FOR CLOTS Immature Platelet Fraction may be clinically indicated, consider ordering this additional test [id number] REPEATED TO VERIFY PLATELET COUNT CONFIRMED BY SMEAR    nRBC 0.0 0.0 - 0.2 %    Comment: Performed at [facility], [place] [address] [place], [place] [address]  Comprehensive metabolic panel     Status: Abnormal   Collection Time: 07/01/20 12:29 AM  Result Value Ref Range   Sodium 140 135 - 145 mmol/L   Potassium 3.9 3.5 - 5.1 mmol/L   Chloride 105 98 - 111 mmol/L   CO2 26 22 - 32 mmol/L   Glucose, Bld 121 (H) 70 - 99 mg/dL    Comment: Glucose reference range applies only to samples taken after fasting for at least 8 hours.   BUN 12 8 - 23 mg/dL   Creatinine, Ser 0.98 0.61 - 1.24 mg/dL   Calcium 8.8 (L) 8.9 - 10.3 mg/dL   Total Protein 6.2 (L) 6.5 - 8.1 g/dL   Albumin 3.6 3.5 - 5.0 g/dL   AST 39 15 - 41 U/L   ALT 33 0 - 44 U/L   Alkaline Phosphatase 45 38 - 126 U/L   Total Bilirubin 0.9 0.3 - 1.2 mg/dL   GFR, Estimated >60 >60 mL/min    Comment: (NOTE) Calculated using the CKD-EPI Creatinine Equation (2021)    Anion gap 9 5 - 15    Comment: Performed at [facility], [place] [address] [place], [place] [address]  Magnesium     Status: None   Collection Time: 07/01/20 12:29 AM  Result Value Ref Range   Magnesium 2.0 1.7 - 2.4 mg/dL  Comment: Performed at [facility], [place] [address] [place] [address] [place], [place] [address]  Phosphorus     Status: None   Collection Time: 07/01/20 12:29 AM  Result Value Ref Range   Phosphorus 3.1 2.5 - 4.6 mg/dL    Comment: Performed at [facility] [address], [place], [place] [address]    Current Facility-Administered Medications  Medication Dose Route Frequency Provider Last Rate Last Admin  . albuterol (PROVENTIL) (2.5 MG/3ML) 0.083% nebulizer solution 2.5 mg  2.5 mg Nebulization Q6H PRN Fuller Plan A, MD      . Chlorhexidine Gluconate Cloth 2 % PADS 6 each  6 each  Topical Daily Norval Morton, MD   6 each at 07/01/20 0912  . enoxaparin (LOVENOX) injection 40 mg  40 mg Subcutaneous Q24H Tamala Julian, Rondell A, MD   40 mg at 76/19/50 9326  . folic acid (FOLVITE) tablet 1 mg  1 mg Oral Q24H Smith, Rondell A, MD      . lipase/protease/amylase (CREON) capsule 36,000-72,000 Units  36,000-72,000 Units Oral QID Norval Morton, MD   72,000 Units at 07/01/20 1322  . LORazepam (ATIVAN) injection 0-4 mg  0-4 mg Intravenous Q6H Smith, Rondell A, MD   1 mg at 07/01/20 0529   Followed by  . [START ON 07/02/2020] LORazepam (ATIVAN) injection 0-4 mg  0-4 mg Intravenous Q12H Smith, Rondell A, MD      . LORazepam (ATIVAN) tablet 1-4 mg  1-4 mg Oral Q1H PRN Norval Morton, MD       Or  . LORazepam (ATIVAN) injection 1-4 mg  1-4 mg Intravenous Q1H PRN Fuller Plan A, MD   3 mg at 07/01/20 1302  . multivitamin with minerals tablet 1 tablet  1 tablet Oral Q24H Smith, Rondell A, MD      . naphazoline-pheniramine (NAPHCON-A) 0.025-0.3 % ophthalmic solution 1 drop  1 drop Both Eyes BID PRN Tamala Julian, Rondell A, MD      . sodium chloride flush (NS) 0.9 % injection 3 mL  3 mL Intravenous Q12H Smith, Rondell A, MD   3 mL at 07/01/20 0914  . thiamine tablet 100 mg  100 mg Oral Q24H Smith, Rondell A, MD       Or  . thiamine (B-1) injection 100 mg  100 mg Intravenous Q24H Smith, Rondell A, MD      . traZODone (DESYREL) tablet 150 mg  150 mg Oral QHS Smith, Rondell A, MD   150 mg at 06/30/20 2320    Musculoskeletal: Strength & Muscle Tone: within normal limits Gait & Station: Deferred. Patient leans: N/A            Psychiatric Specialty Exam:  Presentation  General Appearance: Appropriate for Environment  Eye Contact:Fair  Speech:Slow; Slurred  Speech Volume:Decreased  Handedness:Left   Mood and Affect  Mood:Depressed; Hopeless  Affect:Congruent; Depressed   Thought Process  Thought Processes:Goal Directed  Descriptions of  Associations:Tangential  Orientation:Partial (Oriented to self and place but not to time. thinks its 38 and then says 2023.)  Thought Content:Perseveration  History of Schizophrenia/Schizoaffective disorder:No data recorded Duration of Psychotic Symptoms:No data recorded Hallucinations:Hallucinations: None  Ideas of Reference:None  Suicidal Thoughts:Suicidal Thoughts: Yes, Active SI Active Intent and/or Plan: Without Plan  Homicidal Thoughts:Homicidal Thoughts: No   Sensorium  Memory:Recent Poor; Remote Poor  Judgment:Poor  Insight:Fair   Executive Functions  Concentration:Poor  Attention Span:Poor  Recall:Poor  Fund of Knowledge:Fair  Language:Fair   Psychomotor Activity  Psychomotor Activity:Psychomotor Activity:  Normal   Assets  Assets:Desire for Improvement; Financial Resources/Insurance; Housing; Social Support   Sleep  Sleep:Sleep: Fair   Physical Exam: Physical Exam Vitals and nursing note reviewed.  Constitutional:      General: He is in acute distress.     Appearance: Normal appearance. He is ill-appearing.  HENT:     Head: Normocephalic and atraumatic.  Pulmonary:     Effort: Pulmonary effort is normal.  Neurological:     General: No focal deficit present.     Mental Status: He is alert.     Comments: Oriented to self and place but not time. Says its 1979 and then says 2023.    Review of Systems  Constitutional: Negative for chills and fever.  HENT: Negative for hearing loss.   Eyes: Negative for blurred vision.  Respiratory: Negative for cough.   Cardiovascular: Negative for chest pain.  Gastrointestinal: Negative for nausea and vomiting.  Genitourinary: Negative for dysuria.  Musculoskeletal: Positive for back pain.  Skin: Negative for rash.  Neurological: Positive for tremors. Negative for dizziness and headaches.  Psychiatric/Behavioral: Positive for depression, hallucinations, memory loss, substance abuse and suicidal  ideas. The patient is nervous/anxious and has insomnia.    Blood pressure 123/79, pulse 84, temperature 98.2 F (36.8 C), temperature source Oral, resp. rate 14, height 5\' 10"  (1.778 m), weight 82 kg, SpO2 94 %. Body mass index is 25.94 kg/m.  Treatment Plan Summary:  Case discussed with Dr. Dwyane Dee. Patient meets criteria for inpatient admission.  Recommended inpatient admission to psychiatry unit when medically cleared.  -Please contact social worker for [facility] to [facility] when medically cleared. CSW can contact [facility] at [facility] for placement.  -Start and Increase Zoloft to 100 mg once daily. -Continue trazodone 150 nightly per primary. -Continue alcohol detox protocol with Ativan, will thiamine, folic acid. -Rest treatment plan per primary.   Disposition: Recommend psychiatric Inpatient admission when medically cleared.  Armando Reichert, MD 07/01/2020 1:49 PM

## 2020-07-01 NOTE — Significant Event (Addendum)
Rapid Response Event Note   Reason for Call :  CIWA 23 at 1400, received 3mg  IV Ativan at 1300  Initial Focused Assessment:  Pt lying in bed. PERRLA, 53mm. Moving all extremities equally. Skin is warm, dry. Pulses are 2+ peripherally. He is pleasant, disoriented with visual hallucinations. Requires frequent redirection. Restless, anxious. 5/7 tremor. Pt breathing is regular, unlabored. Clear breath sounds heard throughout.   Pt has history of ETOH, currently on CIWA protocol. CIWA 24 on my assessment. 4mg  IV Ativan given per protocol. MD notified.   VS: T 98.24F, BP 141/91, HR 98, RR 21, SpO2 95% on 2LNC  Interventions:  -CIWA protocol  Plan of Care:  -RN to reevaluate CIWA score 1 hour following intervention -Aspiration precautions, seizure precautions  Call rapid response for additional needs  Event Summary:  MD Notified: Dr. Karleen Hampshire Call Time: 7169 Arrival Time: 6789 End Time: [place], RN

## 2020-07-01 NOTE — Progress Notes (Signed)
PROGRESS NOTE    Leonard [name]  EGB:[id number] DOB: 1960/04/30 DOA: 06/30/2020 PCP: Gaynelle Arabian, MD    Chief Complaint  Patient presents with  . Drug Overdose    Brief Narrative:  Leonard [name] is a 60 y.o. male with medical history significant of hyperlipidemia nxiety, depression, bilateral iliac artery aneurysm, arthritis, alcohol /oopid abuse, and chronic back pain who presents with complaints taking a lot of pills after having an argument with his wife yesterday afternoon. He has apparently overdosed on lyrica, trazodone and takes alcohol regularly.  Poison control was notified and recommended observation with IV fluids, serial monitoring of EKG, and benzodiazepines as needed.  Influenza and COVID-19 screening were negative.  Patient was given  Ativan 0.5 mg x 1 dose IV, 1 L normal saline IV fluids, and then ordered lactated Ringer's at 250 mL/h for 6 hours.  Involuntary commitment orders were placed by the ED provider. Psychiatry consulted. Pt see and examined. He appears to be flushed and tremulous.   Assessment & Plan:   Principal Problem:   Suicide attempt by multiple drug overdose, initial encounter [facility]) Active Problems:   Anxiety and depression   Elevated LFTs   Alcohol abuse   Acute urinary retention   Fall at home, initial encounter   Hypocalcemia   Suicide ([place])   Suicide Attempt by multiple meds: Overdose intentionally , sitter at bedside.  IVC'ed.  Suicide precautions.  Psychiatry consult    Acute alcohol hepatitis Would explain the mild elevation of liver enzymes.   Alcohol abuse:  - currently on CIWA protocol.  - pt tremulous, on ativan prn.    Hypocalcemia:  Replaced.   Thrombocytopenia:  Drop in platelets from 137,000 to 65,000. Will hold the lovenox and repeat platelet count in am.    DVT prophylaxis:  SCD'S Code Status: full code.  Family Communication: none at bedside.  Disposition:   Status is: Inpatient  Remains inpatient  appropriate because:Ongoing diagnostic testing needed not appropriate for outpatient work up and IV treatments appropriate due to intensity of illness or inability to take PO   Dispo: The patient is from: Home              Anticipated d/c is to: pending.               Patient currently is not medically stable to d/c.   Difficult to place patient No       Consultants:   Psychiatry.    Procedures: None.    Antimicrobials: none.    Subjective: No chest pain or sob.   Objective: Vitals:   06/30/20 1953 06/30/20 2118 06/30/20 2300 07/01/20 0400  BP: 125/88 135/85 127/86 123/79  Pulse: 85 84 80 84  Resp:   15 14  Temp: 98.2 F (36.8 C) (!) 97.5 F (36.4 C) (!) 97.4 F (36.3 C) 98.2 F (36.8 C)  TempSrc: Oral Oral Oral Oral  SpO2: 93% 93% 94% 94%  Weight:      Height:        Intake/Output Summary (Last 24 hours) at 07/01/2020 1406 Last data filed at 07/01/2020 0626 Gross per 24 hour  Intake 1101.39 ml  Output 2485 ml  Net -1383.61 ml   Filed Weights   06/30/20 0920  Weight: 82 kg    Examination:  General exam: well developed gentleman, appears to be flushed and tremulous.  Respiratory system: Clear to auscultation. Respiratory effort normal. Cardiovascular system: S1 & S2 heard, tachycardic.  No JVD, Gastrointestinal system:  Abdomen is nondistended, soft and nontender.Normal bowel sounds heard. Central nervous system: Alert, able to answer simple questions. . Extremities: no pedal edema.  Skin: no rashes.  Psychiatry:. Pt tremulous.      Data Reviewed: I have personally reviewed following labs and imaging studies  CBC: Recent Labs  Lab 06/30/20 0914 07/01/20 0029  WBC 7.2 5.3  NEUTROABS 5.5  --   HGB 15.2 12.7*  HCT 45.5 38.2*  MCV 88.9 89.0  PLT 137* 65*    Basic Metabolic Panel: Recent Labs  Lab 06/30/20 1057 07/01/20 0029  NA 142 140  K 5.0 3.9  CL 105 105  CO2 26 26  GLUCOSE 119* 121*  BUN 12 12  CREATININE 1.06 0.98  CALCIUM  8.5* 8.8*  MG  --  2.0  PHOS  --  3.1    GFR: Estimated Creatinine Clearance: 81.7 mL/min (by C-G formula based on SCr of 0.98 mg/dL).  Liver Function Tests: Recent Labs  Lab 06/30/20 1057 07/01/20 0029  AST 61* 39  ALT 42 33  ALKPHOS 46 45  BILITOT 0.9 0.9  PROT 7.1 6.2*  ALBUMIN 4.0 3.6    CBG: No results for input(s): GLUCAP in the last 168 hours.   Recent Results (from the past 240 hour(s))  Resp Panel by RT-PCR (Flu A&B, Covid) Nasopharyngeal Swab     Status: None   Collection Time: 06/30/20  9:16 AM   Specimen: Nasopharyngeal Swab; Nasopharyngeal(NP) swabs in vial transport medium  Result Value Ref Range Status   SARS Coronavirus 2 by RT PCR NEGATIVE NEGATIVE Final    Comment: (NOTE) SARS-CoV-2 target nucleic acids are NOT DETECTED.  The SARS-CoV-2 RNA is generally detectable in upper respiratory specimens during the acute phase of infection. The lowest concentration of SARS-CoV-2 viral copies this assay can detect is 138 copies/mL. A negative result does not preclude SARS-Cov-2 infection and should not be used as the sole basis for treatment or other patient management decisions. A negative result may occur with  improper specimen collection/handling, submission of specimen other than nasopharyngeal swab, presence of viral mutation(s) within the areas targeted by this assay, and inadequate number of viral copies(<138 copies/mL). A negative result must be combined with clinical observations, patient history, and epidemiological information. The expected result is Negative.  Fact Sheet for Patients:  EntrepreneurPulse.com.au  Fact Sheet for Healthcare Providers:  IncredibleEmployment.be  This test is no t yet approved or cleared by the [place] FDA and  has been authorized for detection and/or diagnosis of SARS-CoV-2 by FDA under an Emergency Use Authorization (EUA). This EUA will remain  in effect (meaning this test  can be used) for the duration of the COVID-19 declaration under Section 564(b)(1) of the Act, 21 U.S.C.section 360bbb-3(b)(1), unless the authorization is terminated  or revoked sooner.       Influenza A by PCR NEGATIVE NEGATIVE Final   Influenza B by PCR NEGATIVE NEGATIVE Final    Comment: (NOTE) The Xpert Xpress SARS-CoV-2/FLU/RSV plus assay is intended as an aid in the diagnosis of influenza from Nasopharyngeal swab specimens and should not be used as a sole basis for treatment. Nasal washings and aspirates are unacceptable for Xpert Xpress SARS-CoV-2/FLU/RSV testing.  Fact Sheet for Patients: EntrepreneurPulse.com.au  Fact Sheet for Healthcare Providers: IncredibleEmployment.be  This test is not yet approved or cleared by the [place] FDA and has been authorized for detection and/or diagnosis of SARS-CoV-2 by FDA under an Emergency Use Authorization (EUA). This EUA will remain in effect (  meaning this test can be used) for the duration of the COVID-19 declaration under Section 564(b)(1) of the Act, 21 U.S.C. section 360bbb-3(b)(1), unless the authorization is terminated or revoked.  Performed at [facility], [address], [place], [place] [address]          Radiology Studies: DG Chest 1 View  Result Date: 06/30/2020 CLINICAL DATA:  60 year old male with overdose. EXAM: CHEST  1 VIEW COMPARISON:  Chest radiograph dated 03/22/2020. FINDINGS: Shallow inspiration with minimal bibasilar atelectasis. No focal consolidation, pleural effusion, pneumothorax. Mild cardiomegaly. No acute osseous pathology. Lower cervical ACDF. IMPRESSION: No active cardiopulmonary disease. Electronically Signed   By: Anner Crete M.D.   On: 06/30/2020 16:55   CT HEAD WO CONTRAST  Result Date: 06/30/2020 CLINICAL DATA:  Head trauma, abnormal mental status (Age 21-64y) EXAM: CT HEAD WITHOUT CONTRAST TECHNIQUE: Contiguous axial images were  obtained from the base of the skull through the vertex without intravenous contrast. COMPARISON:  Head CT 03/22/2020 FINDINGS: Brain: Stable brain volume. No intracranial hemorrhage, mass effect, or midline shift. No hydrocephalus. The basilar cisterns are patent. No evidence of territorial infarct or acute ischemia. No extra-axial or intracranial fluid collection. Vascular: No hyperdense vessel. Skull: No fracture or focal lesion. Sinuses/Orbits: Paranasal sinuses and mastoid air cells are clear. The visualized orbits are unremarkable. Other: None. IMPRESSION: No acute intracranial abnormality. No skull fracture. Electronically Signed   By: Keith Rake M.D.   On: 06/30/2020 17:20   CT CERVICAL SPINE WO CONTRAST  Result Date: 06/30/2020 CLINICAL DATA:  Neck trauma, intoxicated or obtunded (Age >= 16y) EXAM: CT CERVICAL SPINE WITHOUT CONTRAST TECHNIQUE: Multidetector CT imaging of the cervical spine was performed without intravenous contrast. Multiplanar CT image reconstructions were also generated. COMPARISON:  Radiograph 01/11/2018 FINDINGS: Alignment: Postsurgical straightening.  No acute malalignment. Skull base and vertebrae: Multilevel anterior fusion with varying hardware from C3 through C7. Hardware is unchanged in position from prior radiograph. No acute fracture dens is intact. Skull base is intact. Soft tissues and spinal canal: No prevertebral fluid or swelling. No visible canal hematoma. Disc levels: Fusion C3-C4 through C6-C7. No high-grade canal stenosis. Upper chest: No acute or unexpected findings. Other: Carotid calcifications. IMPRESSION: 1. No acute fracture or subluxation of the cervical spine. 2. Multilevel surgical fusion, stable from prior imaging. Electronically Signed   By: Keith Rake M.D.   On: 06/30/2020 17:23        Scheduled Meds: . Chlorhexidine Gluconate Cloth  6 each Topical Daily  . enoxaparin (LOVENOX) injection  40 mg Subcutaneous Q24H  . folic acid  1 mg  Oral Q24H  . lipase/protease/amylase  36,000-72,000 Units Oral QID  . LORazepam  0-4 mg Intravenous Q6H   Followed by  . [START ON 07/02/2020] LORazepam  0-4 mg Intravenous Q12H  . multivitamin with minerals  1 tablet Oral Q24H  . sodium chloride flush  3 mL Intravenous Q12H  . thiamine  100 mg Oral Q24H   Or  . thiamine  100 mg Intravenous Q24H  . traZODone  150 mg Oral QHS   Continuous Infusions:   LOS: 0 days       Hosie Poisson, MD [facility]   To contact the attending provider between 7A-7P or the covering provider during after hours 7P-7A, please log into the web site [email or web address] and access using universal [place] password for that web site. If you do not have the password, please call the hospital operator.  07/01/2020, 2:06 PM

## 2020-07-01 NOTE — Progress Notes (Signed)
Poison control called with recommendations. Notified MD.

## 2020-07-02 DIAGNOSIS — F1010 Alcohol abuse, uncomplicated: Secondary | ICD-10-CM

## 2020-07-02 DIAGNOSIS — T50912A Poisoning by multiple unspecified drugs, medicaments and biological substances, intentional self-harm, initial encounter: Secondary | ICD-10-CM

## 2020-07-02 DIAGNOSIS — R338 Other retention of urine: Secondary | ICD-10-CM

## 2020-07-02 DIAGNOSIS — F32A Depression, unspecified: Secondary | ICD-10-CM

## 2020-07-02 DIAGNOSIS — F419 Anxiety disorder, unspecified: Secondary | ICD-10-CM

## 2020-07-02 LAB — CBC WITH DIFFERENTIAL/PLATELET
Abs Immature Granulocytes: 0.02 10*3/uL (ref 0.00–0.07)
Basophils Absolute: 0 10*3/uL (ref 0.0–0.1)
Basophils Relative: 0 %
Eosinophils Absolute: 0.1 10*3/uL (ref 0.0–0.5)
Eosinophils Relative: 1 %
HCT: 39.8 % (ref 39.0–52.0)
Hemoglobin: 13.4 g/dL (ref 13.0–17.0)
Immature Granulocytes: 0 %
Lymphocytes Relative: 15 %
Lymphs Abs: 0.7 10*3/uL (ref 0.7–4.0)
MCH: 29.5 pg (ref 26.0–34.0)
MCHC: 33.7 g/dL (ref 30.0–36.0)
MCV: 87.5 fL (ref 80.0–100.0)
Monocytes Absolute: 0.4 10*3/uL (ref 0.1–1.0)
Monocytes Relative: 8 %
Neutro Abs: 3.7 10*3/uL (ref 1.7–7.7)
Neutrophils Relative %: 76 %
Platelets: 54 10*3/uL — ABNORMAL LOW (ref 150–400)
RBC: 4.55 MIL/uL (ref 4.22–5.81)
RDW: 12.6 % (ref 11.5–15.5)
WBC: 4.8 10*3/uL (ref 4.0–10.5)
nRBC: 0 % (ref 0.0–0.2)

## 2020-07-02 LAB — COMPREHENSIVE METABOLIC PANEL
ALT: 29 U/L (ref 0–44)
AST: 32 U/L (ref 15–41)
Albumin: 3.8 g/dL (ref 3.5–5.0)
Alkaline Phosphatase: 49 U/L (ref 38–126)
Anion gap: 7 (ref 5–15)
BUN: 10 mg/dL (ref 8–23)
CO2: 31 mmol/L (ref 22–32)
Calcium: 9.1 mg/dL (ref 8.9–10.3)
Chloride: 102 mmol/L (ref 98–111)
Creatinine, Ser: 0.99 mg/dL (ref 0.61–1.24)
GFR, Estimated: >60 mL/min (ref >60)
Glucose, Bld: 106 mg/dL — ABNORMAL HIGH (ref 70–99)
Potassium: 3.5 mmol/L (ref 3.5–5.1)
Sodium: 140 mmol/L (ref 135–145)
Total Bilirubin: 1.3 mg/dL — ABNORMAL HIGH (ref 0.3–1.2)
Total Protein: 7.1 g/dL (ref 6.5–8.1)

## 2020-07-02 LAB — MAGNESIUM: Magnesium: 2 mg/dL (ref 1.7–2.4)

## 2020-07-02 LAB — CALCIUM, IONIZED: Calcium, Ionized, Serum: 5 mg/dL (ref 4.5–5.6)

## 2020-07-02 MED ORDER — PREGABALIN 75 MG PO CAPS
75.0000 mg | ORAL_CAPSULE | Freq: Two times a day (BID) | ORAL | Status: DC
  Administered 2020-07-02 – 2020-07-04 (×5): 75 mg via ORAL
  Filled 2020-07-02 (×5): qty 1

## 2020-07-02 MED ORDER — POTASSIUM CHLORIDE CRYS ER 20 MEQ PO TBCR
40.0000 meq | EXTENDED_RELEASE_TABLET | Freq: Once | ORAL | Status: AC
  Administered 2020-07-02: 40 meq via ORAL
  Filled 2020-07-02: qty 2

## 2020-07-02 NOTE — Evaluation (Signed)
Physical Therapy Evaluation Patient Details Name: Leonard [name] MRN: [id number] DOB: 21-Mar-1960 Today's Date: 07/02/2020   History of Present Illness  60 y.o. male presents to ED 06/30/20 with tremors and increased thirst and experiencing a fall after taking a lot of pills and alcohol prior evening after having an argument with his wife and treatening suicide. In ED found to have SaO2 89%O2 improved with 2L O2 via [place]. Involuntarily committed by the ED provider. PMH: hyperlipidemia anxiety, depression, bilateral iliac artery aneurysm, arthritis, alcohol /oopid abuse, and chronic back pain.  Clinical Impression  Pt is awake, and conversant on entry agreeable to working with therapy. Overall behavior WFL, except for one brief bout of pt talking to someone who was not in the room. Pt able to answer questions about home set up and PLOF which are consistent to information from prior hospitalization. Pt reports living with his wife in a two story home with bed and bath on first level and 2 steps to enter. Pt reports independence with mobility, however has been using RW more often and uses cart for support in grocery store. Pt reports independence with ADLs and driving. Pt does also report a few falls lately, with tendency to fall backward and to his left. Pt reports no serious injury. Pt is limited in safe mobility today by decreased coordination and balance. Pt requires min A for bed mobility and mod A at hips for transfers and short distance ambulation. Pt with increased posterior lean causing inability to safely use RW for support. Recommendation in place for inpatient behavioral health placement, pt will also need PT to improve mobility. Unclear how much support is available and if pt does not receive inpatient behavioral health placement will need SNF. PT will continue to follow acutely     Follow Up Recommendations Other (comment);SNF (inpatient psych)    Equipment Recommendations  3in1 (PT)        Precautions / Restrictions Precautions Precautions: Fall Precaution Comments: multiple falls prior to admission Restrictions Weight Bearing Restrictions: No      Mobility  Bed Mobility Overal bed mobility: Needs Assistance Bed Mobility: Supine to Sit     Supine to sit: Min assist     General bed mobility comments: pt able to initiate movement of LEoff bed, and struggles to bring his trunk to upright requiring min A, despite multiple attempts requires min A for bringing hips toward EoB    Transfers Overall transfer level: Needs assistance Equipment used: Rolling walker (2 wheeled);1 person hand held assist Transfers: Sit to/from Stand Sit to Stand: Mod assist;+2 safety/equipment         General transfer comment: modA for coming to upright using both hands on RW despite multiple requests to push up from bed, pt requires modA to come all the way to standing due to increased posterior lean, pt unable to keep RW on ground without assist from second person pushing down on walker, pt able to march in place with difficulty and posterior support. had pt sit back down and proceeded to come to standing with face to face assist and support at hips to combat posterior lean  Ambulation/Gait Ambulation/Gait assistance: Mod assist Gait Distance (Feet): 3 Feet Assistive device: 1 person hand held assist Gait Pattern/deviations: Step-to pattern;Decreased step length - right;Decreased step length - left;Shuffle;Narrow base of support Gait velocity: slowed Gait velocity interpretation: <1.31 ft/sec, indicative of household ambulator General Gait Details: modA at hips with use of gait belt with face to face stepping transfer  to recliner on pt L. decreased coordination with stepping and increased cuing for lifting feet        Balance Overall balance assessment: Needs assistance Sitting-balance support: Single extremity supported;Bilateral upper extremity supported;Feet supported;Feet  unsupported;No upper extremity supported Sitting balance-Leahy Scale: Poor Sitting balance - Comments: able to static sit for very short bouts, limited by posterior L lean, requiring outside assist to return to neutral   Standing balance support: Bilateral upper extremity supported;During functional activity Standing balance-Leahy Scale: Zero Standing balance comment: requires maximal outside support to maintain standing balance                             Pertinent Vitals/Pain Pain Assessment: Faces Faces Pain Scale: Hurts little more Pain Location: back with movement Pain Descriptors / Indicators: Grimacing;Guarding Pain Intervention(s): Limited activity within patient's tolerance;Monitored during session;Repositioned    Home Living Family/patient expects to be discharged to:: Private residence Living Arrangements: Spouse/significant other (although per notes she is moving out) Available Help at Discharge: Other (Comment) (not sure family is available) Type of Home: House Home Access: Stairs to enter Entrance Stairs-Rails: Right Entrance Stairs-Number of Steps: 2 Home Layout: Two level;Able to live on main level with bedroom/bathroom Home Equipment: Hand held shower head;Shower seat - built in;Walker - 2 wheels;Cane - single point      Prior Function Level of Independence: Independent with assistive device(s)         Comments: has been using RW at home able to get around grocery store using a cart, was driving     Hand Dominance   Dominant Hand: Right    Extremity/Trunk Assessment   Upper Extremity Assessment Upper Extremity Assessment: RUE deficits/detail;LUE deficits/detail;Difficult to assess due to impaired cognition RUE Deficits / Details: ROM WFL, strength grossly 3/5 RUE Coordination: decreased fine motor;decreased gross motor LUE Deficits / Details: ROM WFL, strength grossly 3/5 LUE Coordination: decreased fine motor;decreased gross motor     Lower Extremity Assessment Lower Extremity Assessment: RLE deficits/detail;LLE deficits/detail;Difficult to assess due to impaired cognition RLE Deficits / Details: ROM WFL, strength grossly 3/5 RLE Coordination: decreased fine motor;decreased gross motor LLE Deficits / Details: ROM WFL, strength grossly 3/5 LLE Coordination: decreased fine motor;decreased gross motor       Communication   Communication: No difficulties;Other (comment) (pt briefly spoke to person not present)  Cognition Arousal/Alertness: Awake/alert Behavior During Therapy: Impulsive;Restless Overall Cognitive Status: Impaired/Different from baseline Area of Impairment: Orientation;Attention;Memory;Following commands;Safety/judgement;Awareness;Problem solving                 Orientation Level: Disoriented to;Time;Situation Current Attention Level: Selective Memory: Decreased short-term memory Following Commands: Follows one step commands with increased time;Follows multi-step commands with increased time Safety/Judgement: Decreased awareness of safety;Decreased awareness of deficits Awareness: Emergent Problem Solving: Slow processing;Decreased initiation;Difficulty sequencing;Requires verbal cues;Requires tactile cues General Comments: pt talking to someone who is not there, requires increased time for processing and has difficulty sequencing movement      General Comments General comments (skin integrity, edema, etc.): Pt on 3L O2 via [place] on entry with SaO2 99%O2, removed supplemental O2 and pt able to maintain SaO2 97%O2. VSS        Assessment/Plan    PT Assessment Patient needs continued PT services  PT Problem List Decreased strength;Decreased activity tolerance;Decreased balance;Decreased mobility;Decreased cognition;Decreased coordination;Decreased safety awareness;Decreased knowledge of use of DME       PT Treatment Interventions DME instruction;Gait training;Stair training;Functional  mobility training;Therapeutic activities;Therapeutic  exercise;Balance training;Cognitive remediation;Neuromuscular re-education;Patient/family education    PT Goals (Current goals can be found in the Care Plan section)  Acute Rehab PT Goals Patient Stated Goal: none stated PT Goal Formulation: Patient unable to participate in goal setting Time For Goal Achievement: 07/16/20 Potential to Achieve Goals: Good    Frequency Min 3X/week   Barriers to discharge Decreased caregiver support         AM-PAC PT "6 Clicks" Mobility  Outcome Measure Help needed turning from your back to your side while in a flat bed without using bedrails?: None Help needed moving from lying on your back to sitting on the side of a flat bed without using bedrails?: A Little Help needed moving to and from a bed to a chair (including a wheelchair)?: A Lot Help needed standing up from a chair using your arms (e.g., wheelchair or bedside chair)?: A Lot Help needed to walk in hospital room?: A Lot Help needed climbing 3-5 steps with a railing? : Total 6 Click Score: 14    End of Session Equipment Utilized During Treatment: Gait belt Activity Tolerance: Patient tolerated treatment well Patient left: in chair;with call bell/phone within reach;with chair alarm set;with family/visitor present Nurse Communication: Mobility status PT Visit Diagnosis: Unsteadiness on feet (R26.81);Other abnormalities of gait and mobility (R26.89);Repeated falls (R29.6);Muscle weakness (generalized) (M62.81);Difficulty in walking, not elsewhere classified (R26.2);Other symptoms and signs involving the nervous system (Y10.175)    Time: 1025-8527 PT Time Calculation (min) (ACUTE ONLY): 21 min   Charges:   PT Evaluation $PT Eval Moderate Complexity: 1 Mod          Zoila Ditullio B. Migdalia Dk PT, DPT Acute Rehabilitation Services Pager [phone] Office [phone]   [place] 07/02/2020, 1:00 PM

## 2020-07-02 NOTE — Consult Note (Addendum)
[facility] Face-to-Face Psychiatry Consult Follow up  Reason for Consult:  Suicidal attempt Referring Physician: Fuller Plan Patient Identification: Leonard [name] MRN:  [id number] Principal Diagnosis: Suicide attempt by multiple drug overdose, initial encounter [facility]) Diagnosis:  Principal Problem:   Suicide attempt by multiple drug overdose, initial encounter [facility]) Active Problems:   Anxiety and depression   Elevated LFTs   Alcohol abuse   Acute urinary retention   Fall at home, initial encounter   Hypocalcemia   Suicide (Time)   Total Time spent with patient: 30 minutes  Subjective:  "I am feeling good" HPI:  Pt is seen and examined today. Pt states his mood is good. Pt rates his mood at 8/10 (10 is the best mood). Pt slept well last night. Pt states his appetite is poor. Pt rates his anxiety at 6/10 (10 is the worst anxiety) Currently, Pt denies any suicidal ideation, homicidal ideation and, visual and auditory hallucination. Pt denies any headache, nausea, vomiting, dizziness, chest pain, SOB, abdominal pain, diarrhea, and constipation. Pt denies any medication side effects and has been tolerating it well. Pt denies any concerns. Yesterday patient CIWA score was high to 23, patient was given Ativan 4 mg and 2 mg Haldol.  Current vital stable, most recent CIWA score was 11 at 316. On examination, patient is alert, oriented x3. His speech is clear with decreased volume.  His mood is "good" and and affect is constricted.  Denies SI, HI, hallucinations.  Leonard [name] is a 60 y.o. male patient with past medical history of anxiety, depression, chronic pancreatitis, h/o alcohol and opioid abuse and chronic back pain admitted with suicide attempt by taking 30 pills of Lyrica and trazodone each with alcohol after having an argument with his wife.     Past Psychiatric History: Depression, anxiety.  Alcohol abuse  Risk to Self:  Yes Risk to Others:  No Prior Inpatient Therapy:  No Prior  Outpatient Therapy:  No  Past Medical History:  Past Medical History:  Diagnosis Date  . Abnormal EKG    anterolateral T wave inversion  . Anxiety   . Arthritis    "lower back, maybe some in my neck" (08/05/2017)  . 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 Chronic lower back pain    spondylolisthesis,spondylosis,DDD,stenosis  . Depression   . Dizziness   . Hyperlipidemia    was on meds but has been off x 5 months d/t elevated liver enzymes  . Hyperlipidemia    statin intolerant because of elevated liver function tests  . Iliac artery aneurysm, bilateral ([facility])    atable at 2.2 cm 07/28/17  . Insomnia    takes Trazodone nightly  . Numbness and tingling of both legs   . Opiate addiction ([place])   . Pancreatitis 08/03/2017  . PONV (postoperative nausea and vomiting)   . Skin cancer    "forehead; right posterior neck"  . Weakness    pain and occasionally tingling in right arm and leg    Past Surgical History:  Procedure Laterality Date  . ANTERIOR CERVICAL DECOMP/DISCECTOMY FUSION  2003, 2007   x 2  . ANTERIOR CERVICAL DECOMP/DISCECTOMY FUSION N/A 10/13/2017   Procedure: Cervical Four-Five Cervical Five-Six Anterior cervical decompression/discectomy/fusion with exploration of Cervical Three-Four, Cervical Six-Seven fusions/possible removal of hardware;  Surgeon: Erline Levine, MD;  Location: [place];  Service: Neurosurgery;  Laterality: N/A;  Cervical Four-Five Cervical Five-Six Anterior cervical decompression/discectomy/fusion with exploration of Cervical  . ANTERIOR LAT LUMBAR FUSION Right  09/29/2018   Procedure: Right Lumbar three-lumbar four Anterolateral lumbar interbody fusion with lateral plate;  Surgeon: Erline Levine, MD;  Location: [place];  Service: Neurosurgery;  Laterality: Right;  . APPENDECTOMY  as a child  . BACK SURGERY    . BILIARY STENT PLACEMENT N/A 03/21/2019   Procedure: BILIARY STENT PLACEMENT;  Surgeon: Clarene Essex, MD;  Location: [facility];  Service:  Endoscopy;  Laterality: N/A;  . COLONOSCOPY    . ERCP N/A 03/21/2019   Procedure: ENDOSCOPIC RETROGRADE CHOLANGIOPANCREATOGRAPHY (ERCP) with stent placement;  Surgeon: Clarene Essex, MD;  Location: [facility];  Service: Endoscopy;  Laterality: N/A;  . KNEE ARTHROSCOPY Right 2011  . LUMBAR LAMINECTOMY  2014   L4-5  . MOHS SURGERY     "forehead; right posterior neck"  . POSTERIOR LUMBAR FUSION  2015  . radial nerve impingement Right 2009   "near elbor"  . SPHINCTEROTOMY  03/21/2019   Procedure: SPHINCTEROTOMY;  Surgeon: Clarene Essex, MD;  Location: [facility];  Service: Endoscopy;;  balloon sweep  . TIBIA FRACTURE SURGERY Right as a child   compound fracture   Family History:  Family History  Problem Relation Age of Onset  . Arthritis Mother   . Cirrhosis Father   . Stroke Neg Hx    Family Psychiatric  History: None reported. Social History:  Social History   Substance and Sexual Activity  Alcohol Use Not Currently  . Alcohol/week: 84.0 standard drinks  . Types: 84 Glasses of wine per week   Comment: 08/05/2017 "2 bottles of red wine/night", 09/26/2018 not currently drinking alcohol     Social History   Substance and Sexual Activity  Drug Use Not Currently    Social History   Socioeconomic History  . Marital status: Married    Spouse name: Not on file  . Number of children: 0  . Years of education: 64  . Highest education level: Not on file  Occupational History  . Occupation: [employer]  Tobacco Use  . Smoking status: Former Smoker    Packs/day: 0.50    Years: 2.00    Pack years: 1.00    Types: Cigarettes  . Smokeless tobacco: Former Systems developer    Types: Snuff    Quit date: 09/14/2017  . Tobacco comment: smoked while he was in high school;   Vaping Use  . Vaping Use: Never used  Substance and Sexual Activity  . Alcohol use: Not Currently    Alcohol/week: 84.0 standard drinks    Types: 84 Glasses of wine per week    Comment: 08/05/2017 "2 bottles  of red wine/night", 09/26/2018 not currently drinking alcohol  . Drug use: Not Currently  . Sexual activity: Yes  Other Topics Concern  . Not on file  Social History Narrative   ** Merged History Encounter **   Lives at home w/ his wife   Right-handed   Caffeine: 3 cups coffee per day   Social Determinants of Health   Financial Resource Strain: Not on file  Food Insecurity: Not on file  Transportation Needs: Not on file  Physical Activity: Not on file  Stress: Not on file  Social Connections: Not on file   Additional Social History:    Allergies:   Allergies  Allergen Reactions  . Duloxetine Other (See Comments)    Suicidal thoughts  . Sulfa Antibiotics Hives, Swelling, Rash and Other (See Comments)    Face swells and this class of meds caused welts, also (Substance With Sulfonamide Structure And  Antibacterial Mechanism Of Action)  . Zocor [Simvastatin] Other (See Comments)    Elevated his liver enzymes    Labs:  Results for orders placed or performed during the hospital encounter of 06/30/20 (from the past 48 hour(s))  Urine rapid drug screen (hosp performed)     Status: None   Collection Time: 06/30/20  5:27 PM  Result Value Ref Range   Opiates NONE DETECTED NONE DETECTED   Cocaine NONE DETECTED NONE DETECTED   Benzodiazepines NONE DETECTED NONE DETECTED   Amphetamines NONE DETECTED NONE DETECTED   Tetrahydrocannabinol NONE DETECTED NONE DETECTED   Barbiturates NONE DETECTED NONE DETECTED    Comment: (NOTE) DRUG SCREEN FOR MEDICAL PURPOSES ONLY.  IF CONFIRMATION IS NEEDED FOR ANY PURPOSE, NOTIFY LAB WITHIN 5 DAYS.  LOWEST DETECTABLE LIMITS FOR URINE DRUG SCREEN Drug Class                     Cutoff (ng/mL) Amphetamine and metabolites    1000 Barbiturate and metabolites    200 Benzodiazepine                 [id number] Tricyclics and metabolites     300 Opiates and metabolites        300 Cocaine and metabolites        300 THC                             50 Performed at [facility], [place] [address], [place], [place] [address]   Urinalysis, Routine w reflex microscopic Urine, Catheterized     Status: Abnormal   Collection Time: 06/30/20  5:27 PM  Result Value Ref Range   Color, Urine YELLOW YELLOW   APPearance CLEAR CLEAR   Specific Gravity, Urine 1.012 1.005 - 1.030   pH 5.0 5.0 - 8.0   Glucose, UA NEGATIVE NEGATIVE mg/dL   Hgb urine dipstick MODERATE (A) NEGATIVE   Bilirubin Urine NEGATIVE NEGATIVE   Ketones, ur NEGATIVE NEGATIVE mg/dL   Protein, ur NEGATIVE NEGATIVE mg/dL   Nitrite NEGATIVE NEGATIVE   Leukocytes,Ua NEGATIVE NEGATIVE   RBC / HPF 0-5 0 - 5 RBC/hpf   WBC, UA 0-5 0 - 5 WBC/hpf   Bacteria, UA NONE SEEN NONE SEEN   Mucus PRESENT    Hyaline Casts, UA PRESENT     Comment: Performed at [facility] [address] [place], [place] [address]  HIV Antibody (routine testing w rflx)     Status: None   Collection Time: 06/30/20  8:47 PM  Result Value Ref Range   HIV Screen 4th Generation wRfx Non Reactive Non Reactive    Comment: Performed at [facility], [place] [address] [place], [place] [address]  CBC     Status: Abnormal   Collection Time: 07/01/20 12:29 AM  Result Value Ref Range   WBC 5.3 4.0 - 10.5 K/uL   RBC 4.29 4.22 - 5.81 MIL/uL   Hemoglobin 12.7 (L) 13.0 - 17.0 g/dL   HCT 38.2 (L) 39.0 - 52.0 %   MCV 89.0 80.0 - 100.0 fL   MCH 29.6 26.0 - 34.0 pg   MCHC 33.2 30.0 - 36.0 g/dL   RDW 13.2 11.5 - 15.5 %   Platelets 65 (L) 150 - 400 K/uL    Comment: SPECIMEN CHECKED FOR CLOTS Immature Platelet Fraction may be clinically indicated, consider ordering this additional test [id number] REPEATED TO VERIFY PLATELET COUNT  CONFIRMED BY SMEAR    nRBC 0.0 0.0 - 0.2 %    Comment: Performed at [facility], [place] [address] [place], [place] [address]  Comprehensive metabolic panel     Status: Abnormal   Collection Time: 07/01/20 12:29 AM  Result Value Ref Range   Sodium 140 135 - 145 mmol/L    Potassium 3.9 3.5 - 5.1 mmol/L   Chloride 105 98 - 111 mmol/L   CO2 26 22 - 32 mmol/L   Glucose, Bld 121 (H) 70 - 99 mg/dL    Comment: Glucose reference range applies only to samples taken after fasting for at least 8 hours.   BUN 12 8 - 23 mg/dL   Creatinine, Ser 0.98 0.61 - 1.24 mg/dL   Calcium 8.8 (L) 8.9 - 10.3 mg/dL   Total Protein 6.2 (L) 6.5 - 8.1 g/dL   Albumin 3.6 3.5 - 5.0 g/dL   AST 39 15 - 41 U/L   ALT 33 0 - 44 U/L   Alkaline Phosphatase 45 38 - 126 U/L   Total Bilirubin 0.9 0.3 - 1.2 mg/dL   GFR, Estimated >60 >60 mL/min    Comment: (NOTE) Calculated using the CKD-EPI Creatinine Equation (2021)    Anion gap 9 5 - 15    Comment: Performed at [facility], [place] [address], [place], [place] [address]  Magnesium     Status: None   Collection Time: 07/01/20 12:29 AM  Result Value Ref Range   Magnesium 2.0 1.7 - 2.4 mg/dL    Comment: Performed at [facility] [address] [place], [place] [address]  Phosphorus     Status: None   Collection Time: 07/01/20 12:29 AM  Result Value Ref Range   Phosphorus 3.1 2.5 - 4.6 mg/dL    Comment: Performed at [facility] [address] [place], [place] [address]  Calcium, ionized     Status: None   Collection Time: 07/01/20 12:29 AM  Result Value Ref Range   Calcium, Ionized, Serum 5.0 4.5 - 5.6 mg/dL    Comment: (NOTE) Performed At: [facility] [place], [place] [id number] Rush Farmer MD [id number]   CBC with Differential/Platelet     Status: Abnormal   Collection Time: 07/02/20  3:37 AM  Result Value Ref Range   WBC 4.8 4.0 - 10.5 K/uL   RBC 4.55 4.22 - 5.81 MIL/uL   Hemoglobin 13.4 13.0 - 17.0 g/dL   HCT 39.8 39.0 - 52.0 %   MCV 87.5 80.0 - 100.0 fL   MCH 29.5 26.0 - 34.0 pg   MCHC 33.7 30.0 - 36.0 g/dL   RDW 12.6 11.5 - 15.5 %   Platelets 54 (L) 150 - 400 K/uL    Comment: Immature Platelet Fraction may be clinically indicated, consider ordering this additional  test [id number] CONSISTENT WITH PREVIOUS RESULT    nRBC 0.0 0.0 - 0.2 %   Neutrophils Relative % 76 %   Neutro Abs 3.7 1.7 - 7.7 K/uL   Lymphocytes Relative 15 %   Lymphs Abs 0.7 0.7 - 4.0 K/uL   Monocytes Relative 8 %   Monocytes Absolute 0.4 0.1 - 1.0 K/uL   Eosinophils Relative 1 %   Eosinophils Absolute 0.1 0.0 - 0.5 K/uL   Basophils Relative 0 %   Basophils Absolute 0.0 0.0 - 0.1 K/uL   Immature Granulocytes 0 %   Abs Immature Granulocytes 0.02 0.00 - 0.07 K/uL  Comment: Performed at [facility], [place] [address] [place], [place] [address]  Comprehensive metabolic panel     Status: Abnormal   Collection Time: 07/02/20  7:15 AM  Result Value Ref Range   Sodium 140 135 - 145 mmol/L   Potassium 3.5 3.5 - 5.1 mmol/L   Chloride 102 98 - 111 mmol/L   CO2 31 22 - 32 mmol/L   Glucose, Bld 106 (H) 70 - 99 mg/dL    Comment: Glucose reference range applies only to samples taken after fasting for at least 8 hours.   BUN 10 8 - 23 mg/dL   Creatinine, Ser 0.99 0.61 - 1.24 mg/dL   Calcium 9.1 8.9 - 10.3 mg/dL   Total Protein 7.1 6.5 - 8.1 g/dL   Albumin 3.8 3.5 - 5.0 g/dL   AST 32 15 - 41 U/L   ALT 29 0 - 44 U/L   Alkaline Phosphatase 49 38 - 126 U/L   Total Bilirubin 1.3 (H) 0.3 - 1.2 mg/dL   GFR, Estimated >60 >60 mL/min    Comment: (NOTE) Calculated using the CKD-EPI Creatinine Equation (2021)    Anion gap 7 5 - 15    Comment: Performed at [facility] [address], [place], [place] [address]  Magnesium     Status: None   Collection Time: 07/02/20  7:15 AM  Result Value Ref Range   Magnesium 2.0 1.7 - 2.4 mg/dL    Comment: Performed at [facility] [address] [place], [place] [address]    Current Facility-Administered Medications  Medication Dose Route Frequency Provider Last Rate Last Admin  . albuterol (PROVENTIL) (2.5 MG/3ML) 0.083% nebulizer solution 2.5 mg  2.5 mg Nebulization Q6H PRN Fuller Plan A, MD      . Chlorhexidine Gluconate  Cloth 2 % PADS 6 each  6 each Topical Daily Norval Morton, MD   6 each at 07/02/20 0901  . folic acid (FOLVITE) tablet 1 mg  1 mg Oral Q24H Smith, Rondell A, MD   1 mg at 07/01/20 1644  . haloperidol lactate (HALDOL) injection 2 mg  2 mg Intravenous Q6H PRN Hosie Poisson, MD   2 mg at 07/01/20 1842  . lipase/protease/amylase (CREON) capsule 36,000-72,000 Units  36,000-72,000 Units Oral QID Norval Morton, MD   72,000 Units at 07/02/20 0901  . LORazepam (ATIVAN) injection 0-4 mg  0-4 mg Intravenous Q6H Smith, Rondell A, MD   2 mg at 07/02/20 0515   Followed by  . LORazepam (ATIVAN) injection 0-4 mg  0-4 mg Intravenous Q12H Smith, Rondell A, MD      . LORazepam (ATIVAN) tablet 1-4 mg  1-4 mg Oral Q1H PRN Fuller Plan A, MD   1 mg at 07/02/20 1054   Or  . LORazepam (ATIVAN) injection 1-4 mg  1-4 mg Intravenous Q1H PRN Fuller Plan A, MD   4 mg at 07/01/20 1842  . multivitamin with minerals tablet 1 tablet  1 tablet Oral Q24H Fuller Plan A, MD   1 tablet at 07/01/20 1644  . naphazoline-pheniramine (NAPHCON-A) 0.025-0.3 % ophthalmic solution 1 drop  1 drop Both Eyes BID PRN Fuller Plan A, MD      . sertraline (ZOLOFT) tablet 100 mg  100 mg Oral Daily Armando Reichert, MD   100 mg at 07/02/20 0901  . sodium chloride flush (NS) 0.9 % injection 3 mL  3 mL Intravenous Q12H Smith, Rondell A, MD   3 mL at 07/02/20  4332  . thiamine tablet 100 mg  100 mg Oral Q24H Smith, Rondell A, MD   100 mg at 07/01/20 1644  . traZODone (DESYREL) tablet 150 mg  150 mg Oral QHS Fuller Plan A, MD   150 mg at 07/02/20 0031    Musculoskeletal: Strength & Muscle Tone: within normal limits Gait & Station: Deferred. Patient leans: N/A            Psychiatric Specialty Exam:  Presentation  General Appearance: Appropriate for Environment  Eye Contact:Fair  Speech:Slow  Speech Volume:Decreased  Handedness:Left   Mood and Affect  Mood:Dysphoric  Affect:Congruent; Constricted   Thought  Process  Thought Processes:Goal Directed  Descriptions of Associations:Intact  Orientation:Full (Time, Place and Person)  Thought Content:Logical  History of Schizophrenia/Schizoaffective disorder:No data recorded Duration of Psychotic Symptoms:No data recorded Hallucinations:Hallucinations: None  Ideas of Reference:None  Suicidal Thoughts:Suicidal Thoughts: No SI Active Intent and/or Plan: Without Plan  Homicidal Thoughts:Homicidal Thoughts: No   Sensorium  Memory:Immediate Fair; Recent Fair; Remote Fair  Judgment:Fair  Insight:Fair   Executive Functions  Concentration:Fair  Attention Span:Fair  [place]   Psychomotor Activity  Psychomotor Activity:Psychomotor Activity: Normal   Assets  Assets:Desire for Improvement; Financial Resources/Insurance; Housing; Social Support   Sleep  Sleep:Sleep: Fair   Physical Exam: Physical Exam Vitals and nursing note reviewed.  Constitutional:      General: He is not in acute distress.    Appearance: Normal appearance. He is not ill-appearing, toxic-appearing or diaphoretic.  HENT:     Head: Normocephalic and atraumatic.  Pulmonary:     Effort: Pulmonary effort is normal.  Neurological:     General: No focal deficit present.     Mental Status: He is alert.    Review of Systems  Constitutional: Negative for chills and fever.  HENT: Negative for hearing loss.   Eyes: Negative for blurred vision.  Respiratory: Negative for cough.   Cardiovascular: Negative for chest pain.  Gastrointestinal: Negative for nausea and vomiting.  Genitourinary: Negative for dysuria.  Musculoskeletal: Negative for back pain.  Skin: Negative for rash.  Neurological: Positive for tremors. Negative for dizziness and headaches.  Psychiatric/Behavioral: Positive for depression and substance abuse. Negative for hallucinations, memory loss and suicidal ideas. The patient is not nervous/anxious  and does not have insomnia.    Blood pressure 132/87, pulse 75, temperature (!) 97.4 F (36.3 C), temperature source Axillary, resp. rate 16, height 5\' 10"  (1.778 m), weight 82 kg, SpO2 100 %. Body mass index is 25.94 kg/m.  Treatment Plan Summary:  Case discussed with Dr. Dwyane Dee. Patient  meets criteria for inpatient admission.  Recommended inpatient admission to psychiatry unit when medically cleared.  -Please contact social worker for placement to [facility] when medically cleared. CSW can contact AC at [facility] for placement.  -Continue Zoloft to 100 mg once daily. -Continue trazodone 150 nightly per primary. -Continue alcohol detox protocol with Ativan, will thiamine, folic acid. -Rest treatment plan per primary.  -Psychiatry will follow.  Disposition: Recommend psychiatric Inpatient admission when medically cleared.  Armando Reichert, MD 07/02/2020 11:56 AM

## 2020-07-02 NOTE — Progress Notes (Signed)
PROGRESS NOTE        PATIENT DETAILS Name: Leonard [name] Age: 60 y.o. Sex: male Date of Birth: 1960/04/24 Admit Date: 06/30/2020 Admitting Physician Hosie Poisson, MD [id number], Herbie Baltimore, MD  Brief Narrative: Patient is a 60 y.o. male HLD, anxiety/depression, alcohol use, chronic back pain-who presented with a suicidal attempt (drug overdose) following a argument with his wife.  Hospital course complicated by development of alcohol withdrawal symptoms.  Significant events: 4/25>> admit with drug overdose-swallowed multiple pills-following argument with wife.  Significant studies: 4/25>> CT head: No acute abnormalities 4/25>> CT C-spine: No fracture 4/25>> chest x-ray: No pneumonia  Antimicrobial therapy: None  Microbiology data: 4/25>> influenza/COVID PCR: Negative  Procedures : None  Consults: Psychiatry  DVT Prophylaxis : SCDs due to thrombocytopenia  Subjective: Appears flushed-but relatively awake/alert this morning.  Continues to have tremors.   Assessment/Plan: Suicidal attempt-drug overdose-ingestion of multiple pills (approximately 30 pills of Lyrica/trazodone along with alcohol): Suicide precautions in place-psych following-with recommendations for inpatient psychiatry placement on discharge.  Alcohol abuse: Last drink on 4/24 per patient-continues to have withdrawal symptoms-on Ativan per CIWA protocol.  Mildly elevated LFTs: Probably consequence of alcoholic hepatitis-LFTs have normalized.  Thrombocytopenia: Unclear etiology-suspect that this is due to alcohol use.  Continue to follow closely-if decreases significantly-May require further work-up.  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  Defer further to PCP.  History of chronic pancreatitis/enteric pseudocyst: Remains on Creon.  History of chronic pain syndrome:  Follows with [facility] management at [facility] Lyrica as outpatient  Anxiety/depression: On trazodone/Zoloft-psych following.  Acute urinary retention: Foley catheter placed 2 days back-we will attempt voiding trial in the next day or so.   Diet: Diet Order            Diet regular Room service appropriate? Yes; Fluid consistency: Thin  Diet effective now                  Code Status: Full code   Family Communication: None at bedside  Disposition Plan: Status is: Inpatient  Remains inpatient appropriate because:Inpatient level of care appropriate due to severity of illness   Dispo: The patient is from: Home              Anticipated d/c is to: [facility]              Patient currently is not medically stable to d/c.   Difficult to place patient No   Barriers to Discharge: Suicidal attempt with drug overdose-suicide precautions in place-alcohol withdrawal symptoms requiring IV Ativan  Antimicrobial agents: Anti-infectives (From admission, onward)   None       Time spent: 25-minutes-Greater than 50% of this time was spent in counseling, explanation of diagnosis, planning of further management, and coordination of care.  MEDICATIONS: Scheduled Meds: . Chlorhexidine Gluconate Cloth  6 each Topical Daily  . folic acid  1 mg Oral [id number]  . lipase/protease/amylase  36,000-72,000 Units Oral QID  . LORazepam  0-4 mg Intravenous Q6H   Followed by  . LORazepam  0-4 mg Intravenous Q12H  . multivitamin with minerals  1 tablet Oral Q24H  . sertraline  100 mg Oral Daily  . sodium chloride flush  3 mL Intravenous Q12H  . thiamine  100 mg Oral Q24H  . traZODone  150  mg Oral QHS   Continuous Infusions: PRN Meds:.albuterol, haloperidol lactate, LORazepam **OR** LORazepam, naphazoline-pheniramine   PHYSICAL EXAM: Vital signs: Vitals:   07/02/20 1159 07/02/20 1200 07/02/20 1201 07/02/20 1202  BP:  120/89    Pulse: 80 78 77 78  Resp: 15 16 16 15   Temp:      TempSrc:      SpO2: 96%  96% 96% 95%  Weight:      Height:       Filed Weights   06/30/20 0920  Weight: 82 kg   Body mass index is 25.94 kg/m.   Gen Exam:Alert awake-not in any distress HEENT:atraumatic, normocephalic Chest: B/L clear to auscultation anteriorly CVS:S1S2 regular Abdomen:soft non tender, non distended Extremities:no edema Neurology: Non focal Skin: no rash  I have personally reviewed following labs and imaging studies  LABORATORY DATA: CBC: Recent Labs  Lab 06/30/20 0914 07/01/20 0029 07/02/20 0337  WBC 7.2 5.3 4.8  NEUTROABS 5.5  --  3.7  HGB 15.2 12.7* 13.4  HCT 45.5 38.2* 39.8  MCV 88.9 89.0 87.5  PLT 137* 65* 54*    Basic Metabolic Panel: Recent Labs  Lab 06/30/20 1057 07/01/20 0029 07/02/20 0715  NA 142 140 140  K 5.0 3.9 3.5  CL 105 105 102  CO2 26 26 31   GLUCOSE 119* 121* 106*  BUN 12 12 10   CREATININE 1.06 0.98 0.99  CALCIUM 8.5* 8.8* 9.1  MG  --  2.0 2.0  PHOS  --  3.1  --     GFR: Estimated Creatinine Clearance: 80.9 mL/min (by C-G formula based on SCr of 0.99 mg/dL).  Liver Function Tests: Recent Labs  Lab 06/30/20 1057 07/01/20 0029 07/02/20 0715  AST 61* 39 32  ALT 42 33 29  ALKPHOS 46 45 49  BILITOT 0.9 0.9 1.3*  PROT 7.1 6.2* 7.1  ALBUMIN 4.0 3.6 3.8   No results for input(s): LIPASE, AMYLASE in the last 168 hours. No results for input(s): AMMONIA in the last 168 hours.  Coagulation Profile: No results for input(s): INR, PROTIME in the last 168 hours.  Cardiac Enzymes: Recent Labs  Lab 06/30/20 1057  CKTOTAL 239    BNP (last 3 results) No results for input(s): PROBNP in the last 8760 hours.  Lipid Profile: No results for input(s): CHOL, HDL, LDLCALC, TRIG, CHOLHDL, LDLDIRECT in the last 72 hours.  Thyroid Function Tests: No results for input(s): TSH, T4TOTAL, FREET4, T3FREE, THYROIDAB in the last 72 hours.  Anemia Panel: No results for input(s): VITAMINB12, FOLATE, FERRITIN, TIBC, IRON, RETICCTPCT in the last 72  hours.  Urine analysis:    Component Value Date/Time   COLORURINE YELLOW 06/30/2020 [name] 06/30/2020 1727   LABSPEC 1.012 06/30/2020 1727   PHURINE 5.0 06/30/2020 1727   GLUCOSEU NEGATIVE 06/30/2020 1727   HGBUR MODERATE (A) 06/30/2020 1727   BILIRUBINUR NEGATIVE 06/30/2020 1727   KETONESUR NEGATIVE 06/30/2020 1727   PROTEINUR NEGATIVE 06/30/2020 1727   NITRITE NEGATIVE 06/30/2020 1727   LEUKOCYTESUR NEGATIVE 06/30/2020 1727    Sepsis Labs: Lactic Acid, Venous    Component Value Date/Time   LATICACIDVEN 1.1 03/20/2020 1417    MICROBIOLOGY: Recent Results (from the past 240 hour(s))  Resp Panel by RT-PCR (Flu A&B, Covid) Nasopharyngeal Swab     Status: None   Collection Time: 06/30/20  9:16 AM   Specimen: Nasopharyngeal Swab; Nasopharyngeal(NP) swabs in vial transport medium  Result Value Ref Range Status   SARS Coronavirus 2 by RT PCR NEGATIVE  NEGATIVE Final    Comment: (NOTE) SARS-CoV-2 target nucleic acids are NOT DETECTED.  The SARS-CoV-2 RNA is generally detectable in upper respiratory specimens during the acute phase of infection. The lowest concentration of SARS-CoV-2 viral copies this assay can detect is 138 copies/mL. A negative result does not preclude SARS-Cov-2 infection and should not be used as the sole basis for treatment or other patient management decisions. A negative result may occur with  improper specimen collection/handling, submission of specimen other than nasopharyngeal swab, presence of viral mutation(s) within the areas targeted by this assay, and inadequate number of viral copies(<138 copies/mL). A negative result must be combined with clinical observations, patient history, and epidemiological information. The expected result is Negative.  Fact Sheet for Patients:  EntrepreneurPulse.com.au  Fact Sheet for Healthcare Providers:  IncredibleEmployment.be  This test is no t yet  approved or cleared by the [place] FDA and  has been authorized for detection and/or diagnosis of SARS-CoV-2 by FDA under an Emergency Use Authorization (EUA). This EUA will remain  in effect (meaning this test can be used) for the duration of the COVID-19 declaration under Section 564(b)(1) of the Act, 21 U.S.C.section 360bbb-3(b)(1), unless the authorization is terminated  or revoked sooner.       Influenza A by PCR NEGATIVE NEGATIVE Final   Influenza B by PCR NEGATIVE NEGATIVE Final    Comment: (NOTE) The Xpert Xpress SARS-CoV-2/FLU/RSV plus assay is intended as an aid in the diagnosis of influenza from Nasopharyngeal swab specimens and should not be used as a sole basis for treatment. Nasal washings and aspirates are unacceptable for Xpert Xpress SARS-CoV-2/FLU/RSV testing.  Fact Sheet for Patients: EntrepreneurPulse.com.au  Fact Sheet for Healthcare Providers: IncredibleEmployment.be  This test is not yet approved or cleared by the [place] FDA and has been authorized for detection and/or diagnosis of SARS-CoV-2 by FDA under an Emergency Use Authorization (EUA). This EUA will remain in effect (meaning this test can be used) for the duration of the COVID-19 declaration under Section 564(b)(1) of the Act, 21 U.S.C. section 360bbb-3(b)(1), unless the authorization is terminated or revoked.  Performed at [facility], [place] [address], [place], [place] [address]     RADIOLOGY STUDIES/RESULTS: DG Chest 1 View  Result Date: 06/30/2020 CLINICAL DATA:  60 year old male with overdose. EXAM: CHEST  1 VIEW COMPARISON:  Chest radiograph dated 03/22/2020. FINDINGS: Shallow inspiration with minimal bibasilar atelectasis. No focal consolidation, pleural effusion, pneumothorax. Mild cardiomegaly. No acute osseous pathology. Lower cervical ACDF. IMPRESSION: No active cardiopulmonary disease. Electronically Signed   By: Anner Crete  M.D.   On: 06/30/2020 16:55   CT HEAD WO CONTRAST  Result Date: 06/30/2020 CLINICAL DATA:  Head trauma, abnormal mental status (Age 57-64y) EXAM: CT HEAD WITHOUT CONTRAST TECHNIQUE: Contiguous axial images were obtained from the base of the skull through the vertex without intravenous contrast. COMPARISON:  Head CT 03/22/2020 FINDINGS: Brain: Stable brain volume. No intracranial hemorrhage, mass effect, or midline shift. No hydrocephalus. The basilar cisterns are patent. No evidence of territorial infarct or acute ischemia. No extra-axial or intracranial fluid collection. Vascular: No hyperdense vessel. Skull: No fracture or focal lesion. Sinuses/Orbits: Paranasal sinuses and mastoid air cells are clear. The visualized orbits are unremarkable. Other: None. IMPRESSION: No acute intracranial abnormality. No skull fracture. Electronically Signed   By: Keith Rake M.D.   On: 06/30/2020 17:20   CT CERVICAL SPINE WO CONTRAST  Result Date: 06/30/2020 CLINICAL DATA:  Neck trauma, intoxicated or obtunded (Age >=  16y) EXAM: CT CERVICAL SPINE WITHOUT CONTRAST TECHNIQUE: Multidetector CT imaging of the cervical spine was performed without intravenous contrast. Multiplanar CT image reconstructions were also generated. COMPARISON:  Radiograph 01/11/2018 FINDINGS: Alignment: Postsurgical straightening.  No acute malalignment. Skull base and vertebrae: Multilevel anterior fusion with varying hardware from C3 through C7. Hardware is unchanged in position from prior radiograph. No acute fracture dens is intact. Skull base is intact. Soft tissues and spinal canal: No prevertebral fluid or swelling. No visible canal hematoma. Disc levels: Fusion C3-C4 through C6-C7. No high-grade canal stenosis. Upper chest: No acute or unexpected findings. Other: Carotid calcifications. IMPRESSION: 1. No acute fracture or subluxation of the cervical spine. 2. Multilevel surgical fusion, stable from prior imaging. Electronically Signed    By: Keith Rake M.D.   On: 06/30/2020 17:23     LOS: 1 day   Oren Binet, MD  [facility]    To contact the attending provider between 7A-7P or the covering provider during after hours 7P-7A, please log into the web site [email or web address] and access using universal [place] password for that web site. If you do not have the password, please call the hospital operator.  07/02/2020, 12:13 PM

## 2020-07-03 LAB — COMPREHENSIVE METABOLIC PANEL
ALT: 27 U/L (ref 0–44)
AST: 31 U/L (ref 15–41)
Albumin: 3.7 g/dL (ref 3.5–5.0)
Alkaline Phosphatase: 51 U/L (ref 38–126)
Anion gap: 7 (ref 5–15)
BUN: 12 mg/dL (ref 8–23)
CO2: 30 mmol/L (ref 22–32)
Calcium: 9.3 mg/dL (ref 8.9–10.3)
Chloride: 99 mmol/L (ref 98–111)
Creatinine, Ser: 0.98 mg/dL (ref 0.61–1.24)
GFR, Estimated: >60 mL/min (ref >60)
Glucose, Bld: 117 mg/dL — ABNORMAL HIGH (ref 70–99)
Potassium: 3.6 mmol/L (ref 3.5–5.1)
Sodium: 136 mmol/L (ref 135–145)
Total Bilirubin: 0.8 mg/dL (ref 0.3–1.2)
Total Protein: 6.9 g/dL (ref 6.5–8.1)

## 2020-07-03 LAB — CBC
HCT: 40.5 % (ref 39.0–52.0)
Hemoglobin: 13.7 g/dL (ref 13.0–17.0)
MCH: 29.1 pg (ref 26.0–34.0)
MCHC: 33.8 g/dL (ref 30.0–36.0)
MCV: 86 fL (ref 80.0–100.0)
Platelets: 65 10*3/uL — ABNORMAL LOW (ref 150–400)
RBC: 4.71 MIL/uL (ref 4.22–5.81)
RDW: 12.3 % (ref 11.5–15.5)
WBC: 5.1 10*3/uL (ref 4.0–10.5)
nRBC: 0 % (ref 0.0–0.2)

## 2020-07-03 LAB — MAGNESIUM: Magnesium: 2 mg/dL (ref 1.7–2.4)

## 2020-07-03 NOTE — Evaluation (Signed)
Occupational Therapy Evaluation Patient Details Name: Leonard [name] MRN: [id number] DOB: 1960-12-26 Today's Date: 07/03/2020    History of Present Illness 60 y.o. male presents to ED 06/30/20 with tremors and increased thirst and experiencing a fall after taking a lot of pills and alcohol prior evening after having an argument with his wife and treatening suicide. In ED found to have SaO2 89%O2 improved with 2L O2 via [place]. Involuntarily committed by the ED provider. PMH: hyperlipidemia anxiety, depression, bilateral iliac artery aneurysm, arthritis, alcohol /oopid abuse, and chronic back pain.   Clinical Impression   PTA, pt lives with spouse and reports Modified [place] with ADLs, IADLs and mobility with intermittent use of cane. (Reported different PLOF to OT today vs PT yesterday). Pt presents now with deficits in cognition, strength, standing balance and endurance. Pt received with NTs prepping for bathing task. Pt overall Min A for UB ADLs and Mod A for LB ADLs. Pt able to demonstrate pivot transfer to [facility] with RW at [place] though difficulty sequencing DME use. Pt with intermittent tremors impacting ability to complete self feeding this AM per NT. Pending inpatient psych needs, pt would also benefit from ST rehab at SNF to maximize physical abilities for completion of ADLs/mobility. Hopeful for quick progress with physical abilities with plan to address mobility to/from bathroom during next session.     Follow Up Recommendations  SNF;Other (comment) (vs inpatient psych)    Equipment Recommendations  3 in 1 bedside commode    Recommendations for Other Services       Precautions / Restrictions Precautions Precautions: Fall Precaution Comments: multiple falls prior to admission Restrictions Weight Bearing Restrictions: No      Mobility Bed Mobility Overal bed mobility: Needs Assistance Bed Mobility: Supine to Sit     Supine to sit: Min guard;HOB elevated     General bed  mobility comments: min guard for safety, no physical assist needed    Transfers Overall transfer level: Needs assistance Equipment used: Rolling walker (2 wheeled) Transfers: Sit to/from [facility] Sit to Stand: Min guard Stand pivot transfers: Min assist       General transfer comment: min guard for sit to stand with RW for safety, Min A for balance and advancing RW appropriately in pivoting to [facility]    Balance Overall balance assessment: Needs assistance Sitting-balance support: Single extremity supported;Feet supported;No upper extremity supported Sitting balance-Leahy Scale: Fair     Standing balance support: Bilateral upper extremity supported;During functional activity;Single extremity supported Standing balance-Leahy Scale: Poor Standing balance comment: reliant on at least one UE support in standing for peri care                           ADL either performed or assessed with clinical judgement   ADL Overall ADL's : Needs assistance/impaired Eating/Feeding: Minimal assistance;Sitting Eating/Feeding Details (indicate cue type and reason): per NT, assistance needed for breakfast due to tremors Grooming: Supervision/safety;Sitting;Wash/dry face Grooming Details (indicate cue type and reason): able to wash face sitting EOB Upper Body Bathing: Supervision/ safety;Sitting Upper Body Bathing Details (indicate cue type and reason): Able to bathe UB, cues for thoroughness Lower Body Bathing: Sit to/from stand;Moderate assistance Lower Body Bathing Details (indicate cue type and reason): Mod A in standing, assist for posterior hygiene, able to bathe front peri region in standing with cues for UE support Upper Body Dressing : Minimal assistance;Sitting   Lower Body Dressing: Moderate assistance;Sit to/from stand Lower  Body Dressing Details (indicate cue type and reason): Noted to reach down to pull up sock with min guard for safety due to impaired  balance Toilet Transfer: Minimal assistance;Stand-pivot;RW;BSC Toilet Transfer Details (indicate cue type and reason): Assist for balance and consistent cues to sequence RW Toileting- Clothing Manipulation and Hygiene: Moderate assistance;Sit to/from stand         General ADL Comments: Pt with decreased tremors noted though evident at times. Decreased safety awareness and use of DME, requiring cues to sequence and assist in maintaining balance     Vision Baseline Vision/History: Wears glasses Wears Glasses: At all times Patient Visual Report: No change from baseline Vision Assessment?: No apparent visual deficits     Perception     Praxis      Pertinent Vitals/Pain Pain Assessment: No/denies pain Pain Intervention(s): Monitored during session     Hand Dominance Right   Extremity/Trunk Assessment Upper Extremity Assessment Upper Extremity Assessment: Generalized weakness (intermittent tremors since admission)   Lower Extremity Assessment Lower Extremity Assessment: Defer to PT evaluation   Cervical / Trunk Assessment Cervical / Trunk Assessment: Normal   Communication Communication Communication: No difficulties   Cognition Arousal/Alertness: Awake/alert Behavior During Therapy: WFL for tasks assessed/performed;Impulsive Overall Cognitive Status: Impaired/Different from baseline Area of Impairment: Orientation;Attention;Memory;Following commands;Safety/judgement;Awareness;Problem solving                 Orientation Level: Disoriented to;Time;Situation Current Attention Level: Selective Memory: Decreased short-term memory Following Commands: Follows one step commands with increased time;Follows multi-step commands with increased time Safety/Judgement: Decreased awareness of safety;Decreased awareness of deficits Awareness: Emergent Problem Solving: Slow processing;Decreased initiation;Difficulty sequencing;Requires verbal cues;Requires tactile cues General  Comments: Pleasant though confusion evident, difficulty sequencing directions when in standing. Decreased knowledge or DME use, impulsive with movements, requires cues for safety   General Comments  Received with NTs prepping pt for bath    Exercises     Shoulder Instructions      Home Living Family/patient expects to be discharged to:: Private residence Living Arrangements: Spouse/significant other (per notes, wife may be moving out) Available Help at Discharge: Family;Other (Comment) (unsure if family available) Type of Home: House Home Access: Stairs to enter [employer] of Steps: 2 Entrance Stairs-Rails: Right Home Layout: Two level;Able to live on main level with bedroom/bathroom     Bathroom Shower/Tub: Walk-in shower;Curtain   Bathroom Toilet: Handicapped height Bathroom Accessibility: Yes   Home Equipment: Hand held shower head;Shower seat - built in;Walker - 2 wheels;Cane - single point          Prior Functioning/Environment Level of Independence: Independent with assistive device(s)        Comments: Reports not typically needing use of AD but intermittent use of cane. Independent with ADLs, able to get around grocery store using a cart, was driving        OT Problem List: Decreased strength;Decreased activity tolerance;Impaired balance (sitting and/or standing);Decreased coordination;Decreased cognition;Decreased safety awareness;Decreased knowledge of use of DME or AE      OT Treatment/Interventions: Self-care/ADL training;Therapeutic exercise;DME and/or AE instruction;Therapeutic activities;Patient/family education;Balance training    OT Goals(Current goals can be found in the care plan section) Acute Rehab OT Goals Patient Stated Goal: none stated OT Goal Formulation: With patient Time For Goal Achievement: 07/17/20 Potential to Achieve Goals: Good  OT Frequency: Min 2X/week   Barriers to D/C:            Co-evaluation  AM-PAC OT "6 Clicks" Daily Activity     Outcome Measure Help from another person eating meals?: A Little Help from another person taking care of personal grooming?: A Little Help from another person toileting, which includes using toliet, bedpan, or urinal?: A Lot Help from another person bathing (including washing, rinsing, drying)?: A Lot Help from another person to put on and taking off regular upper body clothing?: A Little Help from another person to put on and taking off regular lower body clothing?: A Lot 6 Click Score: 15   End of Session Equipment Utilized During Treatment: Rolling walker Nurse Communication: Mobility status  Activity Tolerance: Patient tolerated treatment well Patient left: with nursing/sitter in room (sitting on BSC with RN/NT present)  OT Visit Diagnosis: Unsteadiness on feet (R26.81);Other abnormalities of gait and mobility (R26.89);Muscle weakness (generalized) (M62.81);Other symptoms and signs involving cognitive function                Time: 2505-3976 OT Time Calculation (min): 12 min Charges:  OT General Charges $OT Visit: 1 Visit OT Evaluation $OT Eval Moderate Complexity: 1 Mod  Malachy Chamber, OTR/L Acute Rehab Services Office: [phone]  Layla Maw 07/03/2020, 10:00 AM

## 2020-07-03 NOTE — Progress Notes (Signed)
PROGRESS NOTE        PATIENT DETAILS Name: Leonard [name] Age: 60 y.o. Sex: male Date of Birth: Sep 28, 1960 Admit Date: 06/30/2020 Admitting Physician Hosie Poisson, MD [id number], Herbie [place], MD  Brief Narrative: Patient is a 60 y.o. male HLD, anxiety/depression, alcohol use, chronic back pain-who presented with a suicidal attempt (drug overdose) following a argument with his wife.  Hospital course complicated by development of alcohol withdrawal symptoms.  Significant events: 4/25>> admit with drug overdose-swallowed multiple pills-following argument with wife.  Significant studies: 4/25>> CT head: No acute abnormalities 4/25>> CT C-spine: No fracture 4/25>> chest x-ray: No pneumonia  Antimicrobial therapy: None  Microbiology data: 4/25>> influenza/COVID PCR: Negative  Procedures : None  Consults: Psychiatry  DVT Prophylaxis : SCDs due to thrombocytopenia  Subjective: Much more awake-alert-minimal tremors this morning.  Assessment/Plan: Suicidal attempt-drug overdose-ingestion of multiple pills (approximately 30 pills of Lyrica/trazodone along with alcohol): Suicide precautions in place-psych following-with recommendations for inpatient psychiatry placement on discharge.  Alcohol abuse: Last drink on 4/24 per patient-continues to slowly improve-much more awake and alert today-minimal tremors.  Remains on Ativan per CIWA protocol.  Mildly elevated LFTs: Probably consequence of alcoholic hepatitis-LFTs have normalized.  Thrombocytopenia: Unclear etiology-but suspect that this is due to alcohol use.  Slowly improving-no evidence of bleeding-doubt further work-up is required-anticipate that it will continue to improve over the next few days.    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  Has a history of liver abscesses in the past.  Defer  further to PCP/outpatient setting.  History of chronic pancreatitis/enteric pseudocyst: Remains on Creon.  History of chronic pain syndrome: Follows with pain management at [facility] as outpatient  Anxiety/depression: On trazodone/Zoloft-psych following.  Acute urinary retention: Foley catheter placed a few days back-Per patient-he has had never had prior issues with signs/symptoms of prostatism.Remove Foley catheter today and see how he does.   Diet: Diet Order            Diet regular Room service appropriate? Yes; Fluid consistency: Thin  Diet effective now                  Code Status: Full code   Family Communication: Spouse-Rhonda-over the phone-[phone]-on 4/28  Disposition Plan: Status is: Inpatient  Remains inpatient appropriate because:Inpatient level of care appropriate due to severity of illness   Dispo: The patient is from: Home              Anticipated d/c is to: [facility]              Patient currently is not medically stable to d/c.   Difficult to place patient No   Barriers to Discharge: Suicidal attempt with drug overdose-suicide precautions in place-alcohol withdrawal symptoms requiring IV Ativan  Antimicrobial agents: Anti-infectives (From admission, onward)   None       Time spent: 25-minutes-Greater than 50% of this time was spent in counseling, explanation of diagnosis, planning of further management, and coordination of care.  MEDICATIONS: Scheduled Meds: . Chlorhexidine Gluconate Cloth  6 each Topical Daily  . folic acid  1 mg Oral [id number]  . lipase/protease/amylase  36,000-72,000 Units Oral QID  . LORazepam  0-4 mg Intravenous Q12H  . multivitamin with minerals  1 tablet Oral Q24H  . pregabalin  75  mg Oral BID  . sertraline  100 mg Oral Daily  . sodium chloride flush  3 mL Intravenous Q12H  . thiamine  100 mg Oral Q24H  . traZODone  150 mg Oral QHS   Continuous Infusions: PRN Meds:.albuterol, haloperidol lactate, LORazepam  **OR** LORazepam, naphazoline-pheniramine   PHYSICAL EXAM: Vital signs: Vitals:   07/02/20 1409 07/02/20 1410 07/02/20 2122 07/03/20 0424  BP:   122/72 127/85  Pulse: 82 81 76 71  Resp: 18 19 18 16   Temp:    98.4 F (36.9 C)  TempSrc:    Oral  SpO2: 99% 99% 96% 100%  Weight:      Height:       Filed Weights   06/30/20 0920  Weight: 82 kg   Body mass index is 25.94 kg/m.   Gen Exam:Alert awake-not in any distress HEENT:atraumatic, normocephalic Chest: B/L clear to auscultation anteriorly CVS:S1S2 regular Abdomen:soft non tender, non distended Extremities:no edema Neurology: Non focal Skin: no rash  I have personally reviewed following labs and imaging studies  LABORATORY DATA: CBC: Recent Labs  Lab 06/30/20 0914 07/01/20 0029 07/02/20 0337 07/03/20 0034  WBC 7.2 5.3 4.8 5.1  NEUTROABS 5.5  --  3.7  --   HGB 15.2 12.7* 13.4 13.7  HCT 45.5 38.2* 39.8 40.5  MCV 88.9 89.0 87.5 86.0  PLT 137* 65* 54* 65*    Basic Metabolic Panel: Recent Labs  Lab 06/30/20 1057 07/01/20 0029 07/02/20 0715 07/03/20 0034  NA 142 140 140 136  K 5.0 3.9 3.5 3.6  CL 105 105 102 99  CO2 26 26 31 30   GLUCOSE 119* 121* 106* 117*  BUN 12 12 10 12   CREATININE 1.06 0.98 0.99 0.98  CALCIUM 8.5* 8.8* 9.1 9.3  MG  --  2.0 2.0 2.0  PHOS  --  3.1  --   --     GFR: Estimated Creatinine Clearance: 81.7 mL/min (by C-G formula based on SCr of 0.98 mg/dL).  Liver Function Tests: Recent Labs  Lab 06/30/20 1057 07/01/20 0029 07/02/20 0715 07/03/20 0034  AST 61* 39 32 31  ALT 42 33 29 27  ALKPHOS 46 45 49 51  BILITOT 0.9 0.9 1.3* 0.8  PROT 7.1 6.2* 7.1 6.9  ALBUMIN 4.0 3.6 3.8 3.7   No results for input(s): LIPASE, AMYLASE in the last 168 hours. No results for input(s): AMMONIA in the last 168 hours.  Coagulation Profile: No results for input(s): INR, PROTIME in the last 168 hours.  Cardiac Enzymes: Recent Labs  Lab 06/30/20 1057  CKTOTAL 239    BNP (last 3  results) No results for input(s): PROBNP in the last 8760 hours.  Lipid Profile: No results for input(s): CHOL, HDL, LDLCALC, TRIG, CHOLHDL, LDLDIRECT in the last 72 hours.  Thyroid Function Tests: No results for input(s): TSH, T4TOTAL, FREET4, T3FREE, THYROIDAB in the last 72 hours.  Anemia Panel: No results for input(s): VITAMINB12, FOLATE, FERRITIN, TIBC, IRON, RETICCTPCT in the last 72 hours.  Urine analysis:    Component Value Date/Time   COLORURINE YELLOW 06/30/2020 [place] 06/30/2020 1727   LABSPEC 1.012 06/30/2020 1727   PHURINE 5.0 06/30/2020 1727   GLUCOSEU NEGATIVE 06/30/2020 1727   HGBUR MODERATE (A) 06/30/2020 1727   BILIRUBINUR NEGATIVE 06/30/2020 1727   KETONESUR NEGATIVE 06/30/2020 1727   PROTEINUR NEGATIVE 06/30/2020 1727   NITRITE NEGATIVE 06/30/2020 1727   LEUKOCYTESUR NEGATIVE 06/30/2020 1727    Sepsis Labs: Lactic Acid, Venous    Component  Value Date/Time   LATICACIDVEN 1.1 03/20/2020 1417    MICROBIOLOGY: Recent Results (from the past 240 hour(s))  Resp Panel by RT-PCR (Flu A&B, Covid) Nasopharyngeal Swab     Status: None   Collection Time: 06/30/20  9:16 AM   Specimen: Nasopharyngeal Swab; Nasopharyngeal(NP) swabs in vial transport medium  Result Value Ref Range Status   SARS Coronavirus 2 by RT PCR NEGATIVE NEGATIVE Final    Comment: (NOTE) SARS-CoV-2 target nucleic acids are NOT DETECTED.  The SARS-CoV-2 RNA is generally detectable in upper respiratory specimens during the acute phase of infection. The lowest concentration of SARS-CoV-2 viral copies this assay can detect is 138 copies/mL. A negative result does not preclude SARS-Cov-2 infection and should not be used as the sole basis for treatment or other patient management decisions. A negative result may occur with  improper specimen collection/handling, submission of specimen other than nasopharyngeal swab, presence of viral mutation(s) within the areas targeted by  this assay, and inadequate number of viral copies(<138 copies/mL). A negative result must be combined with clinical observations, patient history, and epidemiological information. The expected result is Negative.  Fact Sheet for Patients:  EntrepreneurPulse.com.au  Fact Sheet for Healthcare Providers:  IncredibleEmployment.be  This test is no t yet approved or cleared by the [place] FDA and  has been authorized for detection and/or diagnosis of SARS-CoV-2 by FDA under an Emergency Use Authorization (EUA). This EUA will remain  in effect (meaning this test can be used) for the duration of the COVID-19 declaration under Section 564(b)(1) of the Act, 21 U.S.C.section 360bbb-3(b)(1), unless the authorization is terminated  or revoked sooner.       Influenza A by PCR NEGATIVE NEGATIVE Final   Influenza B by PCR NEGATIVE NEGATIVE Final    Comment: (NOTE) The Xpert Xpress SARS-CoV-2/FLU/RSV plus assay is intended as an aid in the diagnosis of influenza from Nasopharyngeal swab specimens and should not be used as a sole basis for treatment. Nasal washings and aspirates are unacceptable for Xpert Xpress SARS-CoV-2/FLU/RSV testing.  Fact Sheet for Patients: EntrepreneurPulse.com.au  Fact Sheet for Healthcare Providers: IncredibleEmployment.be  This test is not yet approved or cleared by the [place] FDA and has been authorized for detection and/or diagnosis of SARS-CoV-2 by FDA under an Emergency Use Authorization (EUA). This EUA will remain in effect (meaning this test can be used) for the duration of the COVID-19 declaration under Section 564(b)(1) of the Act, 21 U.S.C. section 360bbb-3(b)(1), unless the authorization is terminated or revoked.  Performed at [facility], [place] [address] [place], [place] [address]     RADIOLOGY STUDIES/RESULTS: No results found.   LOS: 2 days   Oren Binet, MD  [facility]    To contact the attending provider between 7A-7P or the covering provider during after hours 7P-7A, please log into the web site [email or web address] and access using universal [place] password for that web site. If you do not have the password, please call the hospital operator.  07/03/2020, 11:11 AM

## 2020-07-04 DIAGNOSIS — R7989 Other specified abnormal findings of blood chemistry: Secondary | ICD-10-CM

## 2020-07-04 LAB — CBC
HCT: 42.4 % (ref 39.0–52.0)
Hemoglobin: 14.5 g/dL (ref 13.0–17.0)
MCH: 29.7 pg (ref 26.0–34.0)
MCHC: 34.2 g/dL (ref 30.0–36.0)
MCV: 86.9 fL (ref 80.0–100.0)
Platelets: 74 10*3/uL — ABNORMAL LOW (ref 150–400)
RBC: 4.88 MIL/uL (ref 4.22–5.81)
RDW: 12.7 % (ref 11.5–15.5)
WBC: 5 10*3/uL (ref 4.0–10.5)
nRBC: 0 % (ref 0.0–0.2)

## 2020-07-04 MED ORDER — LORAZEPAM 1 MG PO TABS
1.0000 mg | ORAL_TABLET | Freq: Four times a day (QID) | ORAL | Status: DC | PRN
  Administered 2020-07-04: 1 mg via ORAL
  Filled 2020-07-04: qty 1

## 2020-07-04 NOTE — Consult Note (Signed)
  60 y.o. male presents to ED 06/30/20 with tremors and increased thirst and experiencing a fall after taking a lot of pills and alcohol prior evening after having an argument with his wife and treatening suicide. In ED found to have SaO2 89%O2 improved with 2L O2 via [place]. Involuntarily committed by the ED provider. PMH: hyperlipidemia anxiety, depression, bilateral iliac artery aneurysm, arthritis, alcohol /oopid abuse, and chronic back pain.  Patient seen and reassessed by this nurse practitioner. He is observed to be lying in bed, with safety sitter at bedside. He is alert and oriented, calm and cooperative, pleasant ly engaged. He describes his mood as "okay", his affect appears depressed. He rates his depression 6/10 with 10 being the worse. He reports compliance with his medication. He denies any withdraw symptoms at this time. He denies any headaches, n/v, tremors,. He does endorse cravings and urges. His hospital stay has been complicated by alcohol withdrawal symptoms.He continues to meet inpatient admission at this time.He denies suicidal ideations, and able to contract for safety.   -Once medically stable, patient can be referred to inpatient psych unit.  -If no appropriate beds are available at [facility], patient can be referred out of system.  -Continue current medications at this time.  -Continue IVC and safety sitter at this time.

## 2020-07-04 NOTE — Progress Notes (Signed)
PROGRESS NOTE        PATIENT DETAILS Name: Leonard [name] Age: 60 y.o. Sex: male Date of Birth: [birth year] Admit Date: 06/30/2020 Admitting Physician Hosie Poisson, MD [id number], Leonard Baltimore, MD  Brief Narrative: Patient is a 60 y.o. male HLD, anxiety/depression, alcohol use, chronic back pain-who presented with a suicidal attempt (drug overdose) following a argument with his wife.  Hospital course complicated by development of alcohol withdrawal symptoms.  Significant events: 4/25>> admit with drug overdose-swallowed multiple pills-following argument with wife.  Significant studies: 4/25>> CT head: No acute abnormalities 4/25>> CT C-spine: No fracture 4/25>> chest x-ray: No pneumonia  Antimicrobial therapy: None  Microbiology data: 4/25>> influenza/COVID PCR: Negative  Procedures : None  Consults: Psychiatry  DVT Prophylaxis : SCDs due to thrombocytopenia  Subjective: No major issues overnight-completely awake and alert.  Hardly any tremors.  Assessment/Plan: Suicidal attempt-drug overdose-ingestion of multiple pills (approximately 30 pills of Lyrica/trazodone along with alcohol): Suicide precautions in place-psych following-with recommendations for inpatient psychiatry placement on discharge.  Stable from a medical point of view to be discharged to inpatient psychiatry when bed available.  Alcohol abuse: Last drink on 4/24 per patient-significantly improved-no obvious withdrawal symptoms this morning.  Treated with Ativan per CIWA protocol.  Mildly elevated LFTs: Probably consequence of alcoholic hepatitis-LFTs have normalized.  Thrombocytopenia: Unclear etiology-but suspect that this is due to alcohol use.  Platelet count continues to improve-we will check platelets periodically.  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  Has a history  of liver abscesses in the past.  Defer further to PCP/outpatient setting.  History of chronic pancreatitis/enteric pseudocyst: Remains on Creon.  History of chronic pain syndrome: Follows with pain management at [facility] as outpatient  Anxiety/depression: On trazodone/Zoloft-psych following.  Acute urinary retention: Foley catheter placed a few days back-voiding trial successful on 4/28-no longer with a catheter.     Diet: Diet Order            Diet regular Room service appropriate? Yes; Fluid consistency: Thin  Diet effective now                  Code Status: Full code   Family Communication: Spouse-Rhonda-over the phone-[phone]-on 4/28  Disposition Plan: Status is: Inpatient  Remains inpatient appropriate because:Inpatient level of care appropriate due to severity of illness   Dispo: The patient is from: Home              Anticipated d/c is to: [facility]              Patient currently is not medically stable to d/c.   Difficult to place patient No   Barriers to Discharge: Suicidal attempt with drug overdose-suicide precautions in place-alcohol withdrawal symptoms requiring IV Ativan  Antimicrobial agents: Anti-infectives (From admission, onward)   None       Time spent: 25-minutes-Greater than 50% of this time was spent in counseling, explanation of diagnosis, planning of further management, and coordination of care.  MEDICATIONS: Scheduled Meds: . Chlorhexidine Gluconate Cloth  6 each Topical Daily  . folic acid  1 mg Oral B01B  . lipase/protease/amylase  36,000-72,000 Units Oral QID  . LORazepam  0-4 mg Intravenous Q12H  . multivitamin with minerals  1 tablet Oral Q24H  . pregabalin  75 mg Oral BID  .  sertraline  100 mg Oral Daily  . sodium chloride flush  3 mL Intravenous Q12H  . thiamine  100 mg Oral Q24H  . traZODone  150 mg Oral QHS   Continuous Infusions: PRN Meds:.albuterol, haloperidol lactate, naphazoline-pheniramine   PHYSICAL  EXAM: Vital signs: Vitals:   07/03/20 1356 07/03/20 2032 07/04/20 0441 07/04/20 0900  BP: 136/89 138/90 134/89   Pulse: 83 82 74   Resp: 17 17 18    Temp: 98.3 F (36.8 C) 98.9 F (37.2 C) 98 F (36.7 C)   TempSrc: Oral Oral Oral   SpO2: 97% 91% 93% 94%  Weight:      Height:       Filed Weights   06/30/20 0920  Weight: 82 kg   Body mass index is 25.94 kg/m.   Gen Exam:Alert awake-not in any distress HEENT:atraumatic, normocephalic Chest: B/L clear to auscultation anteriorly CVS:S1S2 regular Abdomen:soft non tender, non distended Extremities:no edema Neurology: Non focal Skin: no rash  I have personally reviewed following labs and imaging studies  LABORATORY DATA: CBC: Recent Labs  Lab 06/30/20 0914 07/01/20 0029 07/02/20 0337 07/03/20 0034 07/04/20 0802  WBC 7.2 5.3 4.8 5.1 5.0  NEUTROABS 5.5  --  3.7  --   --   HGB 15.2 12.7* 13.4 13.7 14.5  HCT 45.5 38.2* 39.8 40.5 42.4  MCV 88.9 89.0 87.5 86.0 86.9  PLT 137* 65* 54* 65* 74*    Basic Metabolic Panel: Recent Labs  Lab 06/30/20 1057 07/01/20 0029 07/02/20 0715 07/03/20 0034  NA 142 140 140 136  K 5.0 3.9 3.5 3.6  CL 105 105 102 99  CO2 26 26 31 30   GLUCOSE 119* 121* 106* 117*  BUN 12 12 10 12   CREATININE 1.06 0.98 0.99 0.98  CALCIUM 8.5* 8.8* 9.1 9.3  MG  --  2.0 2.0 2.0  PHOS  --  3.1  --   --     GFR: Estimated Creatinine Clearance: 81.7 mL/min (by C-G formula based on SCr of 0.98 mg/dL).  Liver Function Tests: Recent Labs  Lab 06/30/20 1057 07/01/20 0029 07/02/20 0715 07/03/20 0034  AST 61* 39 32 31  ALT 42 33 29 27  ALKPHOS 46 45 49 51  BILITOT 0.9 0.9 1.3* 0.8  PROT 7.1 6.2* 7.1 6.9  ALBUMIN 4.0 3.6 3.8 3.7   No results for input(s): LIPASE, AMYLASE in the last 168 hours. No results for input(s): AMMONIA in the last 168 hours.  Coagulation Profile: No results for input(s): INR, PROTIME in the last 168 hours.  Cardiac Enzymes: Recent Labs  Lab 06/30/20 1057  CKTOTAL  239    BNP (last 3 results) No results for input(s): PROBNP in the last 8760 hours.  Lipid Profile: No results for input(s): CHOL, HDL, LDLCALC, TRIG, CHOLHDL, LDLDIRECT in the last 72 hours.  Thyroid Function Tests: No results for input(s): TSH, T4TOTAL, FREET4, T3FREE, THYROIDAB in the last 72 hours.  Anemia Panel: No results for input(s): VITAMINB12, FOLATE, FERRITIN, TIBC, IRON, RETICCTPCT in the last 72 hours.  Urine analysis:    Component Value Date/Time   COLORURINE YELLOW 06/30/2020 [place] 06/30/2020 1727   LABSPEC 1.012 06/30/2020 1727   PHURINE 5.0 06/30/2020 1727   GLUCOSEU NEGATIVE 06/30/2020 1727   HGBUR MODERATE (A) 06/30/2020 1727   BILIRUBINUR NEGATIVE 06/30/2020 1727   KETONESUR NEGATIVE 06/30/2020 1727   PROTEINUR NEGATIVE 06/30/2020 1727   NITRITE NEGATIVE 06/30/2020 1727   LEUKOCYTESUR NEGATIVE 06/30/2020 1727    Sepsis  Labs: Lactic Acid, Venous    Component Value Date/Time   LATICACIDVEN 1.1 03/20/2020 1417    MICROBIOLOGY: Recent Results (from the past 240 hour(s))  Resp Panel by RT-PCR (Flu A&B, Covid) Nasopharyngeal Swab     Status: None   Collection Time: 06/30/20  9:16 AM   Specimen: Nasopharyngeal Swab; Nasopharyngeal(NP) swabs in vial transport medium  Result Value Ref Range Status   SARS Coronavirus 2 by RT PCR NEGATIVE NEGATIVE Final    Comment: (NOTE) SARS-CoV-2 target nucleic acids are NOT DETECTED.  The SARS-CoV-2 RNA is generally detectable in upper respiratory specimens during the acute phase of infection. The lowest concentration of SARS-CoV-2 viral copies this assay can detect is 138 copies/mL. A negative result does not preclude SARS-Cov-2 infection and should not be used as the sole basis for treatment or other patient management decisions. A negative result may occur with  improper specimen collection/handling, submission of specimen other than nasopharyngeal swab, presence of viral mutation(s) within  the areas targeted by this assay, and inadequate number of viral copies(<138 copies/mL). A negative result must be combined with clinical observations, patient history, and epidemiological information. The expected result is Negative.  Fact Sheet for Patients:  EntrepreneurPulse.com.au  Fact Sheet for Healthcare Providers:  IncredibleEmployment.be  This test is no t yet approved or cleared by the [place] FDA and  has been authorized for detection and/or diagnosis of SARS-CoV-2 by FDA under an Emergency Use Authorization (EUA). This EUA will remain  in effect (meaning this test can be used) for the duration of the COVID-19 declaration under Section 564(b)(1) of the Act, 21 U.S.C.section 360bbb-3(b)(1), unless the authorization is terminated  or revoked sooner.       Influenza A by PCR NEGATIVE NEGATIVE Final   Influenza B by PCR NEGATIVE NEGATIVE Final    Comment: (NOTE) The Xpert Xpress SARS-CoV-2/FLU/RSV plus assay is intended as an aid in the diagnosis of influenza from Nasopharyngeal swab specimens and should not be used as a sole basis for treatment. Nasal washings and aspirates are unacceptable for Xpert Xpress SARS-CoV-2/FLU/RSV testing.  Fact Sheet for Patients: EntrepreneurPulse.com.au  Fact Sheet for Healthcare Providers: IncredibleEmployment.be  This test is not yet approved or cleared by the [place] FDA and has been authorized for detection and/or diagnosis of SARS-CoV-2 by FDA under an Emergency Use Authorization (EUA). This EUA will remain in effect (meaning this test can be used) for the duration of the COVID-19 declaration under Section 564(b)(1) of the Act, 21 U.S.C. section 360bbb-3(b)(1), unless the authorization is terminated or revoked.  Performed at [facility], [place] [address] [place], [place] [address]     RADIOLOGY STUDIES/RESULTS: No results found.    LOS: 3 days   Oren Binet, MD  [facility]    To contact the attending provider between 7A-7P or the covering provider during after hours 7P-7A, please log into the web site [email or web address] and access using universal [place] password for that web site. If you do not have the password, please call the hospital operator.  07/04/2020, 12:19 PM

## 2020-07-04 NOTE — Progress Notes (Addendum)
Physical Therapy Treatment Patient Details Name: Leonard [name] MRN: [id number] DOB: [birth year] Today's Date: 07/04/2020    History of Present Illness 60 y.o. male presents to ED 06/30/20 with tremors and increased thirst and experiencing a fall after taking a lot of pills and alcohol prior evening after having an argument with his wife and treatening suicide. In ED found to have SaO2 89%O2 improved with 2L O2 via [place]. Involuntarily committed by the ED provider. PMH: hyperlipidemia anxiety, depression, bilateral iliac artery aneurysm, arthritis, alcohol /oopid abuse, and chronic back pain.    PT Comments    Pt is making good progress towards his mobility goals today, however continues to have difficulty cognitively especially with sequencing and command follow. Pt is currently min guard for bed mobility, min A for transfers and min A-min guard for ambulation with RW. Pt will likely not require SNF level rehab at discharge but would benefit from continued PT for progressing of mobility. PT will continue to follow acutely.    Follow Up Recommendations  Other (comment) (inpatient psych)     Equipment Recommendations  3in1 (PT)       Precautions / Restrictions Precautions Precautions: Fall Precaution Comments: multiple falls prior to admission Restrictions Weight Bearing Restrictions: No    Mobility  Bed Mobility Overal bed mobility: Needs Assistance Bed Mobility: Supine to Sit     Supine to sit: Min guard     General bed mobility comments: min guard for safety    Transfers Overall transfer level: Needs assistance Equipment used: Rolling walker (2 wheeled);1 person hand held assist Transfers: Sit to/from Stand Sit to Stand: +2 safety/equipment;Min assist         General transfer comment: minAx2 cuing for hand placement and not pulling up against RW to come to standing.  Ambulation/Gait Ambulation/Gait assistance: Min assist;Min guard Gait Distance (Feet): 150  Feet Assistive device: Rolling walker (2 wheeled) Gait Pattern/deviations: Decreased step length - right;Decreased step length - left;Shuffle;Step-through pattern;Wide base of support Gait velocity: slowed Gait velocity interpretation: <1.8 ft/sec, indicate of risk for recurrent falls General Gait Details: min A progressing to min guard for ambulation with RW in hallway. Pt requires constant cuing for proximity to RW to keep from tripping over it due to wide BoS. pt acknowledges cues but only able to follow through for 2-3 strides         Balance Overall balance assessment: Needs assistance Sitting-balance support: Single extremity supported;Bilateral upper extremity supported;Feet supported;Feet unsupported;No upper extremity supported Sitting balance-Leahy Scale: Poor Sitting balance - Comments: able to static sit for very short bouts, limited by posterior L lean, requiring outside assist to return to neutral   Standing balance support: Bilateral upper extremity supported;No upper extremity supported Standing balance-Leahy Scale: Fair Standing balance comment: able to static stand and place mask without assist                            Cognition Arousal/Alertness: Awake/alert Behavior During Therapy: Impulsive Overall Cognitive Status: Impaired/Different from baseline Area of Impairment: Orientation;Attention;Memory;Following commands;Safety/judgement;Awareness;Problem solving                 Orientation Level: Disoriented to;Time;Situation Current Attention Level: Selective Memory: Decreased short-term memory Following Commands: Follows one step commands with increased time;Follows multi-step commands with increased time Safety/Judgement: Decreased awareness of safety;Decreased awareness of deficits Awareness: Emergent Problem Solving: Slow processing;Decreased initiation;Difficulty sequencing;Requires verbal cues;Requires tactile cues General Comments: pt  continues to be very pleasant  and agreeable to therapy, he continues to have difficulty with sequencing and requires constant cuing for safety with ambulation         General Comments General comments (skin integrity, edema, etc.): VSS on RA      Pertinent Vitals/Pain Pain Assessment: 0-10 Pain Score: 5  Pain Location: back with movement Pain Descriptors / Indicators: Grimacing;Guarding Pain Intervention(s): Limited activity within patient's tolerance;Monitored during session;Repositioned           PT Goals (current goals can now be found in the care plan section) Acute Rehab PT Goals Patient Stated Goal: none stated PT Goal Formulation: Patient unable to participate in goal setting Time For Goal Achievement: 07/16/20 Potential to Achieve Goals: Good Progress towards PT goals: Progressing toward goals    Frequency    Min 3X/week      PT Plan Current plan remains appropriate       AM-PAC PT "6 Clicks" Mobility   Outcome Measure  Help needed turning from your back to your side while in a flat bed without using bedrails?: None Help needed moving from lying on your back to sitting on the side of a flat bed without using bedrails?: A Little Help needed moving to and from a bed to a chair (including a wheelchair)?: A Lot Help needed standing up from a chair using your arms (e.g., wheelchair or bedside chair)?: A Lot Help needed to walk in hospital room?: A Lot Help needed climbing 3-5 steps with a railing? : Total 6 Click Score: 14    End of Session Equipment Utilized During Treatment: Gait belt Activity Tolerance: Patient tolerated treatment well Patient left: in chair;with call bell/phone within reach;with chair alarm set;with family/visitor present Nurse Communication: Mobility status PT Visit Diagnosis: Unsteadiness on feet (R26.81);Other abnormalities of gait and mobility (R26.89);Repeated falls (R29.6);Muscle weakness (generalized) (M62.81);Difficulty in walking,  not elsewhere classified (R26.2);Other symptoms and signs involving the nervous system (I34.742)     Time: 5956-3875 PT Time Calculation (min) (ACUTE ONLY): 18 min  Charges:  $Gait Training: 8-22 mins                     Jaxxon Naeem B. Migdalia Dk PT, DPT Acute Rehabilitation Services Pager [phone] Office [phone]    [place] 07/04/2020, 12:37 PM

## 2020-07-04 NOTE — Discharge Summary (Signed)
PATIENT DETAILS Name: Leonard [name] Age: 60 y.o. Sex: male Date of Birth: 1960-08-24 MRN: [id number]. Admitting Physician: Hosie Poisson, MD QIO:[id number], Herbie Baltimore, MD  Admit Date: 06/30/2020 Discharge date: 07/04/2020  Recommendations for Outpatient Follow-up:  1. Follow up with PCP in 1-2 weeks 2. Please obtain CMP/CBC in one week   Admitted From:  Home   Disposition: Inpatient psychiatry   Home Health: No  Equipment/Devices: None  Discharge Condition: Stable  CODE STATUS: FULL CODE  Diet recommendation:  Diet Order            Diet - low sodium heart healthy           Diet regular Room service appropriate? Yes; Fluid consistency: Thin  Diet effective now                  Brief Narrative: Patient is a 60 y.o. male HLD, anxiety/depression, alcohol use, chronic back pain-who presented with a suicidal attempt (drug overdose) following a argument with his wife.  Hospital course complicated by development of alcohol withdrawal symptoms.  Significant events: 4/25>> admit with drug overdose-swallowed multiple pills-following argument with wife.  Significant studies: 4/25>> CT head: No acute abnormalities 4/25>> CT C-spine: No fracture 4/25>> chest x-ray: No pneumonia  Antimicrobial therapy: None  Microbiology data: 4/25>> influenza/COVID PCR: Negative  Procedures : None  Consults: Psychiatry  Brief Hospital Course: Suicidal attempt-drug overdose-ingestion of multiple pills (approximately 30 pills of Lyrica/trazodone along with alcohol): Suicide precautions in place-psych followed closely-with recommendations for inpatient psychiatry placement on discharge.  Stable from a medical point of view to be discharged to inpatient psychiatry when bed available.  Alcohol abuse: Last drink on 4/24 per patient-significantly improved-no obvious withdrawal symptoms this morning.  Treated with Ativan per [facility] protocol.  Mildly elevated LFTs: Probably  consequence of alcoholic hepatitis-LFTs have normalized.  Thrombocytopenia: Unclear etiology-but suspect that this is due to alcohol use.  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  Has a history of liver abscesses in the past.  Defer further to PCP/outpatient setting.  History of chronic pancreatitis/enteric pseudocyst: Remains on Creon.  History of chronic pain syndrome: Follows with [facility] management at [facility] Lyrica as outpatient  Anxiety/depression: On trazodone/Zoloft-psych following.  Acute urinary retention: Foley catheter placed a few days back-voiding trial successful on 4/28-no longer with a catheter.    Discharge Diagnoses:  Principal Problem:   Suicide attempt by multiple drug overdose, initial encounter [facility]) Active Problems:   Anxiety and depression   Elevated LFTs   Alcohol abuse   Acute urinary retention   Fall at home, initial encounter   Hypocalcemia   Suicide [facility])   Discharge Instructions:  Activity:  As tolerated   Discharge Instructions    Call MD for:  difficulty breathing, headache or visual disturbances   Complete by: As directed    Diet - low sodium heart healthy   Complete by: As directed    Discharge instructions   Complete by: As directed    Follow with Primary MD  Gaynelle Arabian, MD in 1-2 weeks  Please get a complete blood count and chemistry panel checked by your Primary MD at your next visit, and again as instructed by your Primary MD.  Get Medicines reviewed and adjusted: Please take all your medications with you for your next  visit with your Primary MD  Laboratory/radiological data: Please request your Primary MD to go over all hospital tests and procedure/radiological  results at the follow up, please ask your Primary MD to get all Hospital records sent to his/her office.  In some cases, they will be blood work, cultures and biopsy results pending at the time of your discharge. Please request that your primary care M.D. follows up on these results.  Also Note the following: If you experience worsening of your admission symptoms, develop shortness of breath, life threatening emergency, suicidal or homicidal thoughts you must seek medical attention immediately by calling 911 or calling your MD immediately  if symptoms less severe.  You must read complete instructions/literature along with all the possible adverse reactions/side effects for all the Medicines you take and that have been prescribed to you. Take any new Medicines after you have completely understood and accpet all the possible adverse reactions/side effects.   Do not drive when taking Pain medications or sleeping medications (Benzodaizepines)  Do not take more than prescribed Pain, Sleep and Anxiety Medications. It is not advisable to combine anxiety,sleep and pain medications without talking with your primary care practitioner  Special Instructions: If you have smoked or chewed Tobacco  in the last 2 yrs please stop smoking, stop any regular Alcohol  and or any Recreational drug use.  Wear Seat belts while driving.  Please note: You were cared for by a hospitalist during your hospital stay. Once you are discharged, your primary care physician will handle any further medical issues. Please note that NO REFILLS for any discharge medications will be authorized once you are discharged, as it is imperative that you return to your primary care physician (or establish a relationship with a primary care physician if you do not have one) for your post hospital discharge needs so that they can reassess your need for medications and monitor your lab values.   Increase activity slowly   Complete by: As  directed      Allergies as of 07/04/2020      Reactions   Duloxetine Other (See Comments)   Suicidal thoughts   Sulfa Antibiotics Hives, Swelling, Rash, Other (See Comments)   Face swells and this class of meds caused welts, also (Substance With Sulfonamide Structure And Antibacterial Mechanism Of Action)   Zocor [simvastatin] Other (See Comments)   Elevated his liver enzymes      Medication List    STOP taking these medications   sildenafil 100 MG tablet Commonly known as: VIAGRA     TAKE these medications   acetaminophen 325 MG tablet Commonly known as: TYLENOL Take 2 tablets (650 mg total) by mouth every 6 (six) hours as needed for mild pain. What changed:   how much to take  when to take this   aspirin 81 MG EC tablet Take 81 mg by mouth daily. Swallow whole.   B-12 5000 MCG Subl Place 5,000 mcg under the tongue daily.   docusate sodium 100 MG capsule Commonly known as: COLACE Take 100 mg by mouth daily as needed for mild constipation.   fluticasone 50 MCG/ACT nasal spray Commonly known as: FLONASE Place 1-2 sprays into both nostrils daily as needed for allergies or rhinitis.   guaiFENesin-dextromethorphan 100-10 MG/5ML syrup Commonly known as: ROBITUSSIN DM Take 10 mLs by mouth every 6 (six) hours as needed for cough.   ibuprofen 200 MG tablet Commonly known as: ADVIL Take 600 mg by mouth daily as needed for mild pain.  lipase/protease/amylase 36000 UNITS Cpep capsule Commonly known as: CREON Take 1-2 capsules (36,000-72,000 Units total) by mouth 4 (four) times daily.   multivitamin with minerals Tabs tablet Take 1 tablet by mouth daily.   Naphcon-A 0.025-0.3 % ophthalmic solution Generic drug: naphazoline-pheniramine Place 1 drop into both eyes 2 (two) times daily as needed for eye irritation or allergies (for seasonal allergies).   ondansetron 4 MG tablet Commonly known as: ZOFRAN Take 1 tablet (4 mg total) by mouth every 6 (six) hours as  needed for nausea.   polyethylene glycol powder 17 GM/SCOOP powder Commonly known as: GLYCOLAX/MIRALAX Take 1 Container by mouth daily as needed for mild constipation.   pregabalin 75 MG capsule Commonly known as: LYRICA Take 150 mg by mouth 2 (two) times daily.   sertraline 50 MG tablet Commonly known as: ZOLOFT Take 75 mg by mouth 2 (two) times daily.   traZODone 150 MG tablet Commonly known as: DESYREL Take 150 mg by mouth at bedtime.   Vitamin D3 50 MCG (2000 UT) Tabs Take 2,000 Units by mouth daily.       Follow-up Information    Gaynelle Arabian, MD. Schedule an appointment as soon as possible for a visit in 1 week(s).   Specialty: Family Medicine Contact information: [address], [address] [place] [address] [phone]              Allergies  Allergen Reactions  . Duloxetine Other (See Comments)    Suicidal thoughts  . Sulfa Antibiotics Hives, Swelling, Rash and Other (See Comments)    Face swells and this class of meds caused welts, also (Substance With Sulfonamide Structure And Antibacterial Mechanism Of Action)  . Zocor [Simvastatin] Other (See Comments)    Elevated his liver enzymes       Other Procedures/Studies: DG Chest 1 View  Result Date: 06/30/2020 CLINICAL DATA:  60 year old male with overdose. EXAM: CHEST  1 VIEW COMPARISON:  Chest radiograph dated 03/22/2020. FINDINGS: Shallow inspiration with minimal bibasilar atelectasis. No focal consolidation, pleural effusion, pneumothorax. Mild cardiomegaly. No acute osseous pathology. Lower cervical ACDF. IMPRESSION: No active cardiopulmonary disease. Electronically Signed   By: Anner Crete M.D.   On: 06/30/2020 16:55   CT HEAD WO CONTRAST  Result Date: 06/30/2020 CLINICAL DATA:  Head trauma, abnormal mental status (Age 59-64y) EXAM: CT HEAD WITHOUT CONTRAST TECHNIQUE: Contiguous axial images were obtained from the base of the skull through the vertex without intravenous contrast.  COMPARISON:  Head CT 03/22/2020 FINDINGS: Brain: Stable brain volume. No intracranial hemorrhage, mass effect, or midline shift. No hydrocephalus. The basilar cisterns are patent. No evidence of territorial infarct or acute ischemia. No extra-axial or intracranial fluid collection. Vascular: No hyperdense vessel. Skull: No fracture or focal lesion. Sinuses/Orbits: Paranasal sinuses and mastoid air cells are clear. The visualized orbits are unremarkable. Other: None. IMPRESSION: No acute intracranial abnormality. No skull fracture. Electronically Signed   By: Keith Rake M.D.   On: 06/30/2020 17:20   CT CERVICAL SPINE WO CONTRAST  Result Date: 06/30/2020 CLINICAL DATA:  Neck trauma, intoxicated or obtunded (Age >= 16y) EXAM: CT CERVICAL SPINE WITHOUT CONTRAST TECHNIQUE: Multidetector CT imaging of the cervical spine was performed without intravenous contrast. Multiplanar CT image reconstructions were also generated. COMPARISON:  Radiograph 01/11/2018 FINDINGS: Alignment: Postsurgical straightening.  No acute malalignment. Skull base and vertebrae: Multilevel anterior fusion with varying hardware from C3 through C7. Hardware is unchanged in position from prior radiograph. No acute fracture dens is intact. Skull base is  intact. Soft tissues and spinal canal: No prevertebral fluid or swelling. No visible canal hematoma. Disc levels: Fusion C3-C4 through C6-C7. No high-grade canal stenosis. Upper chest: No acute or unexpected findings. Other: Carotid calcifications. IMPRESSION: 1. No acute fracture or subluxation of the cervical spine. 2. Multilevel surgical fusion, stable from prior imaging. Electronically Signed   By: Keith Rake M.D.   On: 06/30/2020 17:23     TODAY-DAY OF DISCHARGE:  Subjective:   Lynnda Shields today has no headache,no chest abdominal pain,no new weakness tingling or numbness, feels much better wants to go home today.   Objective:   Blood pressure (!) 127/97, pulse 95,  temperature (!) 97.5 F (36.4 C), temperature source Oral, resp. rate 18, height 5\' 10"  (1.778 m), weight 82 kg, SpO2 99 %.  Intake/Output Summary (Last 24 hours) at 07/04/2020 1532 Last data filed at 07/04/2020 1331 Gross per 24 hour  Intake 380 ml  Output 350 ml  Net 30 ml   Filed Weights   06/30/20 0920  Weight: 82 kg    Exam: Awake Alert, Oriented *3, No new F.N deficits, Normal affect Moore.AT,PERRAL Supple Neck,No JVD, No cervical lymphadenopathy appriciated.  Symmetrical Chest wall movement, Good air movement bilaterally, CTAB RRR,No Gallops,Rubs or new Murmurs, No Parasternal Heave +ve B.Sounds, Abd Soft, Non tender, No organomegaly appriciated, No rebound -guarding or rigidity. No Cyanosis, Clubbing or edema, No new Rash or bruise   PERTINENT RADIOLOGIC STUDIES: No results found.   PERTINENT LAB RESULTS: CBC: Recent Labs    07/03/20 0034 07/04/20 0802  WBC 5.1 5.0  HGB 13.7 14.5  HCT 40.5 42.4  PLT 65* 74*   CMET CMP     Component Value Date/Time   NA 136 07/03/2020 0034   NA 140 10/05/2016 0951   K 3.6 07/03/2020 0034   CL 99 07/03/2020 0034   CO2 30 07/03/2020 0034   GLUCOSE 117 (H) 07/03/2020 0034   BUN 12 07/03/2020 0034   BUN 21 10/05/2016 0951   CREATININE 0.98 07/03/2020 0034   CALCIUM 9.3 07/03/2020 0034   PROT 6.9 07/03/2020 0034   ALBUMIN 3.7 07/03/2020 0034   AST 31 07/03/2020 0034   ALT 27 07/03/2020 0034   ALKPHOS 51 07/03/2020 0034   BILITOT 0.8 07/03/2020 0034   GFRNONAA >60 07/03/2020 0034   GFRAA >60 04/18/2019 0129    GFR Estimated Creatinine Clearance: 81.7 mL/min (by C-G formula based on SCr of 0.98 mg/dL). No results for input(s): LIPASE, AMYLASE in the last 72 hours. No results for input(s): CKTOTAL, CKMB, CKMBINDEX, TROPONINI in the last 72 hours. Invalid input(s): POCBNP No results for input(s): DDIMER in the last 72 hours. No results for input(s): HGBA1C in the last 72 hours. No results for input(s): CHOL, HDL,  LDLCALC, TRIG, CHOLHDL, LDLDIRECT in the last 72 hours. No results for input(s): TSH, T4TOTAL, T3FREE, THYROIDAB in the last 72 hours.  Invalid input(s): FREET3 No results for input(s): VITAMINB12, FOLATE, FERRITIN, TIBC, IRON, RETICCTPCT in the last 72 hours. Coags: No results for input(s): INR in the last 72 hours.  Invalid input(s): PT Microbiology: Recent Results (from the past 240 hour(s))  Resp Panel by RT-PCR (Flu A&B, Covid) Nasopharyngeal Swab     Status: None   Collection Time: 06/30/20  9:16 AM   Specimen: Nasopharyngeal Swab; Nasopharyngeal(NP) swabs in vial transport medium  Result Value Ref Range Status   SARS Coronavirus 2 by RT PCR NEGATIVE NEGATIVE Final    Comment: (NOTE) SARS-CoV-2 target nucleic acids are  NOT DETECTED.  The SARS-CoV-2 RNA is generally detectable in upper respiratory specimens during the acute phase of infection. The lowest concentration of SARS-CoV-2 viral copies this assay can detect is 138 copies/mL. A negative result does not preclude SARS-Cov-2 infection and should not be used as the sole basis for treatment or other patient management decisions. A negative result may occur with  improper specimen collection/handling, submission of specimen other than nasopharyngeal swab, presence of viral mutation(s) within the areas targeted by this assay, and inadequate number of viral copies(<138 copies/mL). A negative result must be combined with clinical observations, patient history, and epidemiological information. The expected result is Negative.  Fact Sheet for Patients:  EntrepreneurPulse.com.au  Fact Sheet for Healthcare Providers:  IncredibleEmployment.be  This test is no t yet approved or cleared by the [place] FDA and  has been authorized for detection and/or diagnosis of SARS-CoV-2 by FDA under an Emergency Use Authorization (EUA). This EUA will remain  in effect (meaning this test can be used)  for the duration of the COVID-19 declaration under Section 564(b)(1) of the Act, 21 U.S.C.section 360bbb-3(b)(1), unless the authorization is terminated  or revoked sooner.       Influenza A by PCR NEGATIVE NEGATIVE Final   Influenza B by PCR NEGATIVE NEGATIVE Final    Comment: (NOTE) The Xpert Xpress SARS-CoV-2/FLU/RSV plus assay is intended as an aid in the diagnosis of influenza from Nasopharyngeal swab specimens and should not be used as a sole basis for treatment. Nasal washings and aspirates are unacceptable for Xpert Xpress SARS-CoV-2/FLU/RSV testing.  Fact Sheet for Patients: EntrepreneurPulse.com.au  Fact Sheet for Healthcare Providers: IncredibleEmployment.be  This test is not yet approved or cleared by the [place] FDA and has been authorized for detection and/or diagnosis of SARS-CoV-2 by FDA under an Emergency Use Authorization (EUA). This EUA will remain in effect (meaning this test can be used) for the duration of the COVID-19 declaration under Section 564(b)(1) of the Act, 21 U.S.C. section 360bbb-3(b)(1), unless the authorization is terminated or revoked.  Performed at [facility], [place] [address] [place], [place] [address]     FURTHER DISCHARGE INSTRUCTIONS:  Get Medicines reviewed and adjusted: Please take all your medications with you for your next visit with your Primary MD  Laboratory/radiological data: Please request your Primary MD to go over all hospital tests and procedure/radiological results at the follow up, please ask your Primary MD to get all Hospital records sent to his/her office.  In some cases, they will be blood work, cultures and biopsy results pending at the time of your discharge. Please request that your primary care M.D. goes through all the records of your hospital data and follows up on these results.  Also Note the following: If you experience worsening of your admission symptoms,  develop shortness of breath, life threatening emergency, suicidal or homicidal thoughts you must seek medical attention immediately by calling 911 or calling your MD immediately  if symptoms less severe.  You must read complete instructions/literature along with all the possible adverse reactions/side effects for all the Medicines you take and that have been prescribed to you. Take any new Medicines after you have completely understood and accpet all the possible adverse reactions/side effects.   Do not drive when taking Pain medications or sleeping medications (Benzodaizepines)  Do not take more than prescribed Pain, Sleep and Anxiety Medications. It is not advisable to combine anxiety,sleep and pain medications without talking with your primary care practitioner  Special  Instructions: If you have smoked or chewed Tobacco  in the last 2 yrs please stop smoking, stop any regular Alcohol  and or any Recreational drug use.  Wear Seat belts while driving.  Please note: You were cared for by a hospitalist during your hospital stay. Once you are discharged, your primary care physician will handle any further medical issues. Please note that NO REFILLS for any discharge medications will be authorized once you are discharged, as it is imperative that you return to your primary care physician (or establish a relationship with a primary care physician if you do not have one) for your post hospital discharge needs so that they can reassess your need for medications and monitor your lab values.  Total Time spent coordinating discharge including counseling, education and face to face time equals 35 minutes.  SignedOren Binet 07/04/2020 3:32 PM

## 2020-07-04 NOTE — Progress Notes (Signed)
[name] to be D/C'd [place] per MD order.  Discussed with the patient and all questions fully answered. Report called to receiving facility.  VSS, Skin clean, dry and intact without evidence of skin break down, no evidence of skin tears noted. IV catheter discontinued intact. Site without signs and symptoms of complications. Dressing and pressure applied.  An After Visit Summary was printed and given to the patient.   D/c education completed with patient/family including follow up instructions, medication list, d/c activities limitations if indicated, with other d/c instructions as indicated by MD - patient able to verbalize understanding, all questions fully answered.   Patient instructed to return to ED, call 911, or call MD for any changes in condition.   Patient escorted via [place], and D/C too behavioral health facility via private auto/ law enforcement.  Dene Gentry 07/04/2020 4:58 PM

## 2020-07-04 NOTE — TOC Initial Note (Addendum)
Transition of Care [name]) - Initial/Assessment Note    Patient Details  Name: Leonard [name] MRN: [id number] Date of Birth: 1961-01-10  Transition of Care [facility]) CM/SW Contact:    Leonard [name], [place] Phone Number: 07/04/2020, 12:14 PM  Clinical Narrative:   Pt is medically stable and recommended for inpatient psych. He is currently under IVC. CSW made referral to [facility]; they are reviewing.   Additional referrals sent to [facility], [place], [place], [place].            Expected Discharge Plan: Psychiatric Hospital Barriers to Discharge: Psych Bed not available   Patient Goals and CMS Choice        Expected Discharge Plan and Services Expected Discharge Plan: [facility]                                              Prior Living Arrangements/Services                       Activities of Daily Living      Permission Sought/Granted                  Emotional Assessment         Alcohol / Substance Use: Not Applicable Psych Involvement: Yes (comment)  Admission diagnosis:  Hypoxia [R09.02] Suicide attempt by multiple drug overdose, initial encounter ([name]) [T50.912A] Suicide ([name]) [place].[id number]] Patient Active Problem List   Diagnosis Date Noted  . Suicide ([place]) 07/01/2020  . Suicide attempt by multiple drug overdose, initial encounter ([place]) 06/30/2020  . Alcohol abuse 06/30/2020  . Acute urinary retention 06/30/2020  . Fall at home, initial encounter 06/30/2020  . Hypocalcemia 06/30/2020  . AKI (acute kidney injury) ([place]) 03/21/2020  . Acute respiratory failure with hypoxia ([place]) 03/20/2020  . Colitis 04/16/2019  . Chronic pancreatitis ([place]) 03/01/2019  . Alcohol dependence in remission ([place]) 03/01/2019  . Acute on chronic pancreatitis ([place]) 03/01/2019  . Cervical stenosis of spinal canal 10/13/2017  . Acute pancreatitis 08/04/2017  . Elevated LFTs 08/04/2017  . Carotid artery dissection ([place]) 08/04/2017  .  Chronic neck pain 08/04/2017  . Acute alcoholic pancreatitis 54/56/2563  . Hay fever 07/18/2015  . Anxiety state 07/18/2015  . Fatty infiltration of liver 07/18/2015  . Insomnia 07/18/2015  . Anxiety and depression 04/02/2015  . Spondylolisthesis of lumbar region 09/10/2013  . Hyperlipidemia 09/05/2013  . Opiate dependence ([place]) 08/20/2013   PCP:  Gaynelle Arabian, MD Pharmacy:   [facility] [place], [place] - [address] AT [address] [place] [address] Phone: [phone] Fax: [phone]     Social Determinants of Health (SDOH) Interventions    Readmission Risk Interventions No flowsheet data found.

## 2020-07-04 NOTE — TOC Progression Note (Addendum)
Transition of Care [facility]) - Progression Note    Patient Details  Name: Leonard [name] MRN: [id number] Date of Birth: [birth year]  Transition [facility]) CM/SW [place], [place] Phone Number: 07/04/2020, 3:21 PM  Clinical Narrative:     CSW called [facility]. Is informed they can accept pt today. Accepting MD is Dr. Jacqualine Code. Pt will admit to [facility]. RN to call report to [phone]. Pt will transport via Event organiser.   CSW notified pt.  CSW Faxed IVC paper work and discharge summary to [employer].   359: CSW contacted [facility] office and scheduled transport for D/C. Sheriff/Deputy will call 5w unit when on the way.   Expected Discharge Plan: Psychiatric Hospital Barriers to Discharge: Psych Bed not available  Expected Discharge Plan and Services Expected Discharge Plan: [facility]                                               Social Determinants of Health (SDOH) Interventions    Readmission Risk Interventions No flowsheet data found.

## 2022-04-12 DIAGNOSIS — K861 Other chronic pancreatitis: Secondary | ICD-10-CM | POA: Diagnosis not present

## 2022-04-21 ENCOUNTER — Other Ambulatory Visit: Payer: Self-pay | Admitting: Family Medicine

## 2022-04-21 DIAGNOSIS — R110 Nausea: Secondary | ICD-10-CM

## 2022-05-01 DIAGNOSIS — M7022 Olecranon bursitis, left elbow: Secondary | ICD-10-CM | POA: Diagnosis not present

## 2022-05-18 ENCOUNTER — Ambulatory Visit
Admission: RE | Admit: 2022-05-18 | Discharge: 2022-05-18 | Disposition: A | Discharge status: Home / Self Care | Payer: 59 | Source: Ambulatory Visit | Attending: Family Medicine | Admitting: Family Medicine

## 2022-05-18 DIAGNOSIS — N400 Enlarged prostate without lower urinary tract symptoms: Secondary | ICD-10-CM | POA: Diagnosis not present

## 2022-05-18 DIAGNOSIS — I723 Aneurysm of iliac artery: Secondary | ICD-10-CM | POA: Diagnosis not present

## 2022-05-18 DIAGNOSIS — N200 Calculus of kidney: Secondary | ICD-10-CM | POA: Diagnosis not present

## 2022-05-18 DIAGNOSIS — R110 Nausea: Secondary | ICD-10-CM

## 2022-05-18 DIAGNOSIS — R634 Abnormal weight loss: Secondary | ICD-10-CM | POA: Diagnosis not present

## 2022-05-18 MED ORDER — IOPAMIDOL (ISOVUE-300) INJECTION 61%
100.0000 mL | Freq: Once | INTRAVENOUS | Status: AC | PRN
  Administered 2022-05-18: 100 mL via INTRAVENOUS

## 2022-05-27 DIAGNOSIS — A09 Infectious gastroenteritis and colitis, unspecified: Secondary | ICD-10-CM | POA: Diagnosis not present

## 2022-05-27 DIAGNOSIS — K861 Other chronic pancreatitis: Secondary | ICD-10-CM | POA: Diagnosis not present

## 2022-06-07 DIAGNOSIS — F411 Generalized anxiety disorder: Secondary | ICD-10-CM | POA: Diagnosis not present

## 2022-06-07 DIAGNOSIS — N529 Male erectile dysfunction, unspecified: Secondary | ICD-10-CM | POA: Diagnosis not present

## 2022-06-07 DIAGNOSIS — E1169 Type 2 diabetes mellitus with other specified complication: Secondary | ICD-10-CM | POA: Diagnosis not present

## 2022-06-07 DIAGNOSIS — M519 Unspecified thoracic, thoracolumbar and lumbosacral intervertebral disc disorder: Secondary | ICD-10-CM | POA: Diagnosis not present

## 2022-06-07 DIAGNOSIS — K860 Alcohol-induced chronic pancreatitis: Secondary | ICD-10-CM | POA: Diagnosis not present

## 2022-06-07 DIAGNOSIS — M5090 Cervical disc disorder, unspecified, unspecified cervical region: Secondary | ICD-10-CM | POA: Diagnosis not present

## 2022-06-07 DIAGNOSIS — E7800 Pure hypercholesterolemia, unspecified: Secondary | ICD-10-CM | POA: Diagnosis not present

## 2022-06-07 DIAGNOSIS — F1010 Alcohol abuse, uncomplicated: Secondary | ICD-10-CM | POA: Diagnosis not present

## 2022-06-07 DIAGNOSIS — Z0000 Encounter for general adult medical examination without abnormal findings: Secondary | ICD-10-CM | POA: Diagnosis not present

## 2022-06-13 DIAGNOSIS — M7022 Olecranon bursitis, left elbow: Secondary | ICD-10-CM | POA: Diagnosis not present

## 2022-06-16 ENCOUNTER — Encounter (HOSPITAL_BASED_OUTPATIENT_CLINIC_OR_DEPARTMENT_OTHER): Payer: Self-pay | Admitting: Orthopedic Surgery

## 2022-06-16 ENCOUNTER — Other Ambulatory Visit: Payer: Self-pay

## 2022-06-16 DIAGNOSIS — M7022 Olecranon bursitis, left elbow: Secondary | ICD-10-CM | POA: Diagnosis not present

## 2022-06-16 NOTE — H&P (Signed)
PREOPERATIVE H&P  Chief Complaint: LEFT ELBOW SEPTIC BURSITIS  HPI: Leonard [name] is a 62 y.o. male who presents with a diagnosis of LEFT ELBOW SEPTIC BURSITIS. Symptoms are rated as moderate to severe, and have been worsening.  This is significantly impairing activities of daily living.  He has elected for surgical management.   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   Chronic lower back pain    spondylolisthesis,spondylosis,DDD,stenosis   Depression    Dizziness    History of ETOH abuse    Hyperlipidemia    was on meds but has been off x 5 months d/t elevated liver enzymes   Hyperlipidemia    statin intolerant because of elevated liver function tests   Iliac artery aneurysm, bilateral    atable at 2.2 cm 07/28/17   Insomnia    takes Trazodone nightly   Numbness and tingling of both legs    Opiate addiction    Pancreatitis 08/03/2017   Skin cancer    "forehead; right posterior neck"   Weakness    pain and occasionally tingling in right arm and leg   Past Surgical History:  Procedure Laterality Date   ANTERIOR CERVICAL DECOMP/DISCECTOMY FUSION  2003, 2007   x 2   ANTERIOR CERVICAL DECOMP/DISCECTOMY FUSION N/A 10/13/2017   Procedure: Cervical Four-Five Cervical Five-Six Anterior cervical decompression/discectomy/fusion with exploration of Cervical Three-Four, Cervical Six-Seven fusions/possible removal of hardware;  Surgeon: Maeola Harman, MD;  Location: [facility] OR;  Service: Neurosurgery;  Laterality: N/A;  Cervical Four-Five Cervical Five-Six Anterior cervical decompression/discectomy/fusion with exploration of Cervical   ANTERIOR LAT LUMBAR FUSION Right 09/29/2018   Procedure: Right Lumbar three-lumbar four Anterolateral lumbar interbody fusion with lateral plate;  Surgeon: Maeola Harman, MD;  Location: [facility] OR;   Service: Neurosurgery;  Laterality: Right;   APPENDECTOMY  as a child   BACK SURGERY     BILIARY STENT PLACEMENT N/A 03/21/2019   Procedure: BILIARY STENT PLACEMENT;  Surgeon: Vida Rigger, MD;  Location: [facility];  Service: Endoscopy;  Laterality: N/A;   COLONOSCOPY     ERCP N/A 03/21/2019   Procedure: ENDOSCOPIC RETROGRADE CHOLANGIOPANCREATOGRAPHY (ERCP) with stent placement;  Surgeon: Vida Rigger, MD;  Location: [facility];  Service: Endoscopy;  Laterality: N/A;   KNEE ARTHROSCOPY Right 2011   LUMBAR LAMINECTOMY  2014   L4-5   MOHS SURGERY     "forehead; right posterior neck"   POSTERIOR LUMBAR FUSION  2015   radial nerve impingement Right 2009   "near elbor"   SPHINCTEROTOMY  03/21/2019   Procedure: SPHINCTEROTOMY;  Surgeon: Vida Rigger, MD;  Location: [facility];  Service: Endoscopy;;  balloon sweep   TIBIA FRACTURE SURGERY Right as a child   compound fracture   Social History   Socioeconomic History   Marital status: Married    Spouse name: Not on file   Number of children: 0   Years of education: 17   Highest education level: Not on file  Occupational History   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  Tobacco Use   Smoking status: Former    Packs/day: 0.50    Years: 2.00    Additional pack years: 0.00    Total pack years: 1.00    Types: Cigarettes   Smokeless tobacco: Former    Types: Snuff  Quit date: 09/14/2017   Tobacco comments:    smoked while he was in high school;   Vaping Use   Vaping Use: Never used  Substance and Sexual Activity   Alcohol use: Yes    Comment: drinks wine 2-3 night/week, none heavily in 2 yrs   Drug use: Not Currently   Sexual activity: Yes  Other Topics Concern   Not on file  Social History Narrative   ** Merged History Encounter **   Lives at home w/ his wife   Right-handed   Caffeine: 3 cups coffee per day   Social Determinants of Health   Financial Resource Strain: Not on file  Food Insecurity: Not on file   Transportation Needs: Not on file  Physical Activity: Not on file  Stress: Not on file  Social Connections: Not on file   Family History  Problem Relation Age of Onset   Arthritis Mother    Cirrhosis Father    Stroke Neg Hx    Allergies  Allergen Reactions   Duloxetine Other (See Comments)    Suicidal thoughts   Sulfa Antibiotics Hives, Swelling, Rash and Other (See Comments)    Face swells and this class of meds caused welts, also (Substance With Sulfonamide Structure And Antibacterial Mechanism Of Action)   Zocor [Simvastatin] Other (See Comments)    Elevated his liver enzymes   Prior to Admission medications   Medication Sig Start Date End Date Taking? Authorizing Provider  aspirin 81 MG EC tablet Take 81 mg by mouth daily. Swallow whole.   Yes [provider]  cephALEXin (KEFLEX) 500 MG capsule Take 500 mg by mouth 4 (four) times daily.   Yes [provider]  Cholecalciferol (VITAMIN D3) 50 MCG (2000 UT) TABS Take 2,000 Units by mouth daily.   Yes [provider]  cholestyramine (QUESTRAN) 4 g packet Take 4 g by mouth 3 (three) times daily with meals.   Yes [provider]  Cyanocobalamin (B-12) 5000 MCG SUBL Place 5,000 mcg under the tongue daily.   Yes [provider]  escitalopram (LEXAPRO) 20 MG tablet Take 20 mg by mouth daily.   Yes [provider]  ibuprofen (ADVIL) 200 MG tablet Take 600 mg by mouth daily as needed for mild pain.   Yes [provider]  Multiple Vitamin (MULTIVITAMIN WITH MINERALS) TABS tablet Take 1 tablet by mouth daily. 03/09/19  Yes Alwyn Ren, MD  naphazoline-pheniramine (NAPHCON-A) 0.025-0.3 % ophthalmic solution Place 1 drop into both eyes 2 (two) times daily as needed for eye irritation or allergies (for seasonal allergies).   Yes [provider]  rosuvastatin (CRESTOR) 20 MG tablet Take 20 mg by mouth daily.   Yes [provider]  tadalafil (CIALIS) 20 MG  tablet Take 20 mg by mouth daily as needed for erectile dysfunction.   Yes [provider]  traZODone (DESYREL) 150 MG tablet Take 150 mg by mouth at bedtime.   Yes [provider]     Positive ROS: All other systems have been reviewed and were otherwise negative with the exception of those mentioned in the HPI and as above.  Physical Exam: General: Alert, no acute distress Cardiovascular: No pedal edema Respiratory: No cyanosis, no use of accessory musculature GI: No organomegaly, abdomen is soft and non-tender Skin: No lesions in the area of chief complaint Neurologic: Sensation intact distally Psychiatric: Patient is competent for consent with normal mood and affect Lymphatic: No axillary or cervical lymphadenopathy  MUSCULOSKELETAL: TTP  left elbow, ROM slightly limited, erythema and edema present to olecranon bursa, <825mm round opening with scant serosanguinous drainage, NVI   Imaging: n/a   Assessment: LEFT ELBOW SEPTIC BURSITIS  Plan: Plan for Procedure(s): OLECRANON (ELBOW) BURSA INCISION AND DRAINAGE ABSCESS  The risks benefits and alternatives were discussed with the patient including but not limited to the risks of nonoperative treatment, versus surgical intervention including infection, bleeding, nerve injury,  blood clots, cardiopulmonary complications, morbidity, mortality, among others, and they were willing to proceed.   Weightbearing: WBAT LUE Orthopedic devices: sling for comfort Showering: keep area dry Dressing: reinforce PRN Medicines: Norco, Mobic, Zofran, antibiotics  Discharge: home Follow up: 06/25/22 at 11:15am    Jenne PaneMeghan M Seleste Tallman, PA-C Office [phone] 06/16/2022 6:52 PM

## 2022-06-18 ENCOUNTER — Encounter (HOSPITAL_BASED_OUTPATIENT_CLINIC_OR_DEPARTMENT_OTHER)
Admission: RE | Disposition: A | Discharge status: Home / Self Care | Payer: Self-pay | Source: Home / Self Care | Attending: Orthopedic Surgery

## 2022-06-18 ENCOUNTER — Other Ambulatory Visit: Payer: Self-pay

## 2022-06-18 ENCOUNTER — Encounter (HOSPITAL_BASED_OUTPATIENT_CLINIC_OR_DEPARTMENT_OTHER): Payer: Self-pay | Admitting: Orthopedic Surgery

## 2022-06-18 ENCOUNTER — Ambulatory Visit (HOSPITAL_BASED_OUTPATIENT_CLINIC_OR_DEPARTMENT_OTHER): Payer: 59 | Admitting: Anesthesiology

## 2022-06-18 ENCOUNTER — Ambulatory Visit (HOSPITAL_BASED_OUTPATIENT_CLINIC_OR_DEPARTMENT_OTHER)
Admission: RE | Admit: 2022-06-18 | Discharge: 2022-06-18 | Disposition: A | Discharge status: Home / Self Care | Payer: 59 | Attending: Orthopedic Surgery | Admitting: Orthopedic Surgery

## 2022-06-18 DIAGNOSIS — M71122 Other infective bursitis, left elbow: Secondary | ICD-10-CM | POA: Insufficient documentation

## 2022-06-18 DIAGNOSIS — M1990 Unspecified osteoarthritis, unspecified site: Secondary | ICD-10-CM

## 2022-06-18 DIAGNOSIS — Z87891 Personal history of nicotine dependence: Secondary | ICD-10-CM | POA: Diagnosis not present

## 2022-06-18 DIAGNOSIS — F32A Depression, unspecified: Secondary | ICD-10-CM | POA: Diagnosis not present

## 2022-06-18 DIAGNOSIS — E785 Hyperlipidemia, unspecified: Secondary | ICD-10-CM | POA: Insufficient documentation

## 2022-06-18 DIAGNOSIS — M7032 Other bursitis of elbow, left elbow: Secondary | ICD-10-CM

## 2022-06-18 DIAGNOSIS — M545 Low back pain, unspecified: Secondary | ICD-10-CM | POA: Insufficient documentation

## 2022-06-18 DIAGNOSIS — Z85828 Personal history of other malignant neoplasm of skin: Secondary | ICD-10-CM | POA: Insufficient documentation

## 2022-06-18 DIAGNOSIS — G8929 Other chronic pain: Secondary | ICD-10-CM | POA: Insufficient documentation

## 2022-06-18 DIAGNOSIS — F419 Anxiety disorder, unspecified: Secondary | ICD-10-CM | POA: Insufficient documentation

## 2022-06-18 DIAGNOSIS — F418 Other specified anxiety disorders: Secondary | ICD-10-CM | POA: Diagnosis not present

## 2022-06-18 DIAGNOSIS — M7022 Olecranon bursitis, left elbow: Secondary | ICD-10-CM | POA: Diagnosis present

## 2022-06-18 DIAGNOSIS — Z01818 Encounter for other preprocedural examination: Secondary | ICD-10-CM

## 2022-06-18 HISTORY — DX: Alcohol abuse, in remission: F10.11

## 2022-06-18 HISTORY — PX: OLECRANON BURSECTOMY: SHX2097

## 2022-06-18 HISTORY — PX: INCISION AND DRAINAGE ABSCESS: SHX5864

## 2022-06-18 LAB — AEROBIC/ANAEROBIC CULTURE W GRAM STAIN (SURGICAL/DEEP WOUND)

## 2022-06-18 SURGERY — BURSECTOMY, ELBOW
Anesthesia: General | Site: Elbow | Laterality: Left

## 2022-06-18 MED ORDER — ONDANSETRON 4 MG PO TBDP: 4.0000 mg | ORAL_TABLET | Freq: Three times a day (TID) | ORAL | 0 refills | Status: AC | PRN

## 2022-06-18 MED ORDER — ONDANSETRON HCL 4 MG/2ML IJ SOLN
INTRAMUSCULAR | Status: DC | PRN
  Administered 2022-06-18: 4 mg via INTRAVENOUS

## 2022-06-18 MED ORDER — FENTANYL CITRATE (PF) 100 MCG/2ML IJ SOLN
INTRAMUSCULAR | Status: DC | PRN
  Administered 2022-06-18: 25 ug via INTRAVENOUS
  Administered 2022-06-18: 50 ug via INTRAVENOUS
  Administered 2022-06-18: 25 ug via INTRAVENOUS

## 2022-06-18 MED ORDER — CEFAZOLIN SODIUM-DEXTROSE 2-4 GM/100ML-% IV SOLN
2.0000 g | INTRAVENOUS | Status: AC
  Administered 2022-06-18: 2 g via INTRAVENOUS

## 2022-06-18 MED ORDER — POVIDONE-IODINE 10 % EX SWAB: 2.0000 | Freq: Once | CUTANEOUS | Status: DC

## 2022-06-18 MED ORDER — BUPIVACAINE HCL (PF) 0.5 % IJ SOLN
INTRAMUSCULAR | Status: AC
  Filled 2022-06-18: qty 30

## 2022-06-18 MED ORDER — VANCOMYCIN HCL 500 MG IV SOLR
INTRAVENOUS | Status: DC | PRN
  Administered 2022-06-18: 500 mg via TOPICAL

## 2022-06-18 MED ORDER — MIDAZOLAM HCL 2 MG/2ML IJ SOLN
INTRAMUSCULAR | Status: AC
  Filled 2022-06-18: qty 2

## 2022-06-18 MED ORDER — DEXAMETHASONE SODIUM PHOSPHATE 10 MG/ML IJ SOLN
INTRAMUSCULAR | Status: DC | PRN
  Administered 2022-06-18: 4 mg via INTRAVENOUS

## 2022-06-18 MED ORDER — DOXYCYCLINE HYCLATE 50 MG PO CAPS: 100.0000 mg | ORAL_CAPSULE | Freq: Two times a day (BID) | ORAL | 0 refills | Status: AC

## 2022-06-18 MED ORDER — FENTANYL CITRATE (PF) 100 MCG/2ML IJ SOLN
INTRAMUSCULAR | Status: AC
  Filled 2022-06-18: qty 2

## 2022-06-18 MED ORDER — PROPOFOL 10 MG/ML IV BOLUS
INTRAVENOUS | Status: DC | PRN
  Administered 2022-06-18: 160 mg via INTRAVENOUS

## 2022-06-18 MED ORDER — LACTATED RINGERS IV SOLN: INTRAVENOUS | Status: DC

## 2022-06-18 MED ORDER — HYDROCODONE-ACETAMINOPHEN 10-325 MG PO TABS: 1.0000 | ORAL_TABLET | Freq: Four times a day (QID) | ORAL | 0 refills | Status: AC | PRN

## 2022-06-18 MED ORDER — ONDANSETRON HCL 4 MG/2ML IJ SOLN
INTRAMUSCULAR | Status: AC
  Filled 2022-06-18: qty 2

## 2022-06-18 MED ORDER — PHENYLEPHRINE 80 MCG/ML (10ML) SYRINGE FOR IV PUSH (FOR BLOOD PRESSURE SUPPORT)
PREFILLED_SYRINGE | INTRAVENOUS | Status: AC
  Filled 2022-06-18: qty 10

## 2022-06-18 MED ORDER — SODIUM CHLORIDE 0.9 % IR SOLN
Status: DC | PRN
  Administered 2022-06-18: 3000 mL

## 2022-06-18 MED ORDER — LIDOCAINE 2% (20 MG/ML) 5 ML SYRINGE
INTRAMUSCULAR | Status: AC
  Filled 2022-06-18: qty 10

## 2022-06-18 MED ORDER — VANCOMYCIN HCL 500 MG IV SOLR
INTRAVENOUS | Status: AC
  Filled 2022-06-18: qty 10

## 2022-06-18 MED ORDER — MELOXICAM 15 MG PO TABS: 15.0000 mg | ORAL_TABLET | Freq: Every day | ORAL | 0 refills | Status: AC | PRN

## 2022-06-18 MED ORDER — DEXAMETHASONE SODIUM PHOSPHATE 10 MG/ML IJ SOLN: 8.0000 mg | Freq: Once | INTRAMUSCULAR | Status: DC

## 2022-06-18 MED ORDER — OMEPRAZOLE MAGNESIUM 20 MG PO TBEC: 20.0000 mg | DELAYED_RELEASE_TABLET | Freq: Every day | ORAL | 0 refills | Status: AC

## 2022-06-18 MED ORDER — LIDOCAINE 2% (20 MG/ML) 5 ML SYRINGE
INTRAMUSCULAR | Status: DC | PRN
  Administered 2022-06-18: 100 mg via INTRAVENOUS

## 2022-06-18 MED ORDER — ACETAMINOPHEN 500 MG PO TABS
ORAL_TABLET | ORAL | Status: AC
  Filled 2022-06-18: qty 2

## 2022-06-18 MED ORDER — CIPROFLOXACIN HCL 500 MG PO TABS: 500.0000 mg | ORAL_TABLET | Freq: Two times a day (BID) | ORAL | 0 refills | Status: AC

## 2022-06-18 MED ORDER — ACETAMINOPHEN 500 MG PO TABS
1000.0000 mg | ORAL_TABLET | Freq: Once | ORAL | Status: AC
  Administered 2022-06-18: 1000 mg via ORAL

## 2022-06-18 MED ORDER — MIDAZOLAM HCL 5 MG/5ML IJ SOLN
INTRAMUSCULAR | Status: DC | PRN
  Administered 2022-06-18: 2 mg via INTRAVENOUS

## 2022-06-18 MED ORDER — DEXAMETHASONE SODIUM PHOSPHATE 10 MG/ML IJ SOLN
INTRAMUSCULAR | Status: AC
  Filled 2022-06-18: qty 1

## 2022-06-18 MED ORDER — CEFAZOLIN SODIUM-DEXTROSE 2-4 GM/100ML-% IV SOLN
INTRAVENOUS | Status: AC
  Filled 2022-06-18: qty 100

## 2022-06-18 SURGICAL SUPPLY — 63 items
APL PRP STRL LF DISP 70% ISPRP (MISCELLANEOUS)
BLADE SURG 15 STRL LF DISP TIS (BLADE) ×4 IMPLANT
BLADE SURG 15 STRL SS (BLADE) ×2
BNDG CMPR 5X4 CHSV STRCH STRL (GAUZE/BANDAGES/DRESSINGS) ×1
BNDG CMPR 5X4 KNIT ELC UNQ LF (GAUZE/BANDAGES/DRESSINGS) ×1
BNDG COHESIVE 4X5 TAN STRL LF (GAUZE/BANDAGES/DRESSINGS) IMPLANT
BNDG ELASTIC 4INX 5YD STR LF (GAUZE/BANDAGES/DRESSINGS) IMPLANT
CHLORAPREP W/TINT 26 (MISCELLANEOUS) ×2 IMPLANT
CLSR STERI-STRIP ANTIMIC 1/2X4 (GAUZE/BANDAGES/DRESSINGS) ×2 IMPLANT
CNTNR URN SCR LID CUP LEK RST (MISCELLANEOUS) IMPLANT
CONT SPEC 4OZ STRL OR WHT (MISCELLANEOUS) ×1
COVER BACK TABLE 60X90IN (DRAPES) ×2 IMPLANT
COVER MAYO STAND STRL (DRAPES) ×2 IMPLANT
DRAIN PENROSE .5X12 LATEX STL (DRAIN) IMPLANT
DRAPE EXTREMITY T 121X128X90 (DISPOSABLE) IMPLANT
DRAPE IMP U-DRAPE 54X76 (DRAPES) IMPLANT
DRAPE INCISE IOBAN 66X45 STRL (DRAPES) IMPLANT
DRAPE LAPAROTOMY 100X72 PEDS (DRAPES) ×2 IMPLANT
DRSG EMULSION OIL 3X3 NADH (GAUZE/BANDAGES/DRESSINGS) ×2 IMPLANT
ELECT REM PT RETURN 9FT ADLT (ELECTROSURGICAL) ×1
ELECTRODE REM PT RTRN 9FT ADLT (ELECTROSURGICAL) ×2 IMPLANT
GAUZE SPONGE 4X4 12PLY STRL (GAUZE/BANDAGES/DRESSINGS) ×2 IMPLANT
GLOVE BIO SURGEON STRL SZ7.5 (GLOVE) ×2 IMPLANT
GLOVE BIOGEL PI IND STRL 7.0 (GLOVE) IMPLANT
GLOVE BIOGEL PI IND STRL 7.5 (GLOVE) ×2 IMPLANT
GLOVE BIOGEL PI IND STRL 8 (GLOVE) ×2 IMPLANT
GLOVE ECLIPSE 6.5 STRL STRAW (GLOVE) IMPLANT
GLOVE SURG SYN 7.5  E (GLOVE)
GLOVE SURG SYN 7.5 E (GLOVE) IMPLANT
GLOVE SURG SYN 7.5 PF PI (GLOVE) ×2 IMPLANT
GOWN STRL REUS W/ TWL LRG LVL3 (GOWN DISPOSABLE) ×4 IMPLANT
GOWN STRL REUS W/ TWL XL LVL3 (GOWN DISPOSABLE) ×2 IMPLANT
GOWN STRL REUS W/TWL LRG LVL3 (GOWN DISPOSABLE) ×1
GOWN STRL REUS W/TWL XL LVL3 (GOWN DISPOSABLE) ×2
NDL HYPO 22X1.5 SAFETY MO (MISCELLANEOUS) IMPLANT
NEEDLE HYPO 22X1.5 SAFETY MO (MISCELLANEOUS) ×1 IMPLANT
NS IRRIG 1000ML POUR BTL (IV SOLUTION) ×2 IMPLANT
PACK BASIN DAY SURGERY FS (CUSTOM PROCEDURE TRAY) ×2 IMPLANT
PAD CAST 4YDX4 CTTN HI CHSV (CAST SUPPLIES) IMPLANT
PADDING CAST COTTON 4X4 STRL (CAST SUPPLIES) ×1
PENCIL SMOKE EVACUATOR (MISCELLANEOUS) ×2 IMPLANT
SET IRRIG Y TYPE TUR BLADDER L (SET/KITS/TRAYS/PACK) IMPLANT
SLEEVE SCD COMPRESS KNEE MED (STOCKING) IMPLANT
SPIKE FLUID TRANSFER (MISCELLANEOUS) IMPLANT
SPONGE T-LAP 4X18 ~~LOC~~+RFID (SPONGE) ×2 IMPLANT
STOCKINETTE IMPERVIOUS LG (DRAPES) IMPLANT
SUCTION FRAZIER HANDLE 10FR (MISCELLANEOUS)
SUCTION TUBE FRAZIER 10FR DISP (MISCELLANEOUS) IMPLANT
SUT ETHILON 3 0 FSL (SUTURE) IMPLANT
SUT MON AB 2-0 CT1 36 (SUTURE) ×2 IMPLANT
SUT MON AB 4-0 PC3 18 (SUTURE) ×2 IMPLANT
SUT VIC AB 2-0 SH 27 (SUTURE)
SUT VIC AB 2-0 SH 27XBRD (SUTURE) IMPLANT
SUT VICRYL 0 SH 27 (SUTURE) IMPLANT
SWAB COLLECTION DEVICE MRSA (MISCELLANEOUS) IMPLANT
SWAB CULTURE ESWAB REG 1ML (MISCELLANEOUS) IMPLANT
SYR BULB EAR ULCER 3OZ GRN STR (SYRINGE) ×2 IMPLANT
SYR CONTROL 10ML LL (SYRINGE) IMPLANT
TOWEL GREEN STERILE FF (TOWEL DISPOSABLE) ×4 IMPLANT
TRAY DSU PREP LF (CUSTOM PROCEDURE TRAY) IMPLANT
TUBE CONNECTING 20X1/4 (TUBING) ×2 IMPLANT
UNDERPAD 30X36 HEAVY ABSORB (UNDERPADS AND DIAPERS) ×2 IMPLANT
YANKAUER SUCT BULB TIP NO VENT (SUCTIONS) ×2 IMPLANT

## 2022-06-18 NOTE — Transfer of Care (Signed)
Immediate Anesthesia Transfer of Care Note  Patient: Leonard [name]  Procedure(s) Performed: Beverely Risen (ELBOW) BURSA (Left: Elbow) INCISION AND DRAINAGE ABSCESS (Left: Elbow)  Patient Location: PACU  Anesthesia Type:General  Level of Consciousness: awake, alert , and oriented  Airway & Oxygen Therapy: Patient Spontanous Breathing and Patient connected to face mask oxygen  Post-op Assessment: Report given to RN and Post -op Vital signs reviewed and stable  Post vital signs: Reviewed and stable  Last Vitals:  Vitals Value Taken Time  BP 128/89 06/18/22 1505  Temp    Pulse 68 06/18/22 1506  Resp 21 06/18/22 1506  SpO2 100 % 06/18/22 1506  Vitals shown include unvalidated device data.  Last Pain:  Vitals:   06/18/22 1210  TempSrc: Oral  PainSc: 5       Patients Stated Pain Goal: 3 (06/18/22 1210)  Complications: No notable events documented.

## 2022-06-18 NOTE — Interval H&P Note (Signed)
History and Physical Interval Note:  06/18/2022 12:25 PM  Leonard [name]  has presented today for surgery, with the diagnosis of LEFT ELBOW SEPTIC BURSITIS.  The various methods of treatment have been discussed with the patient and family. After consideration of risks, benefits and other options for treatment, the patient has consented to  Procedure(s): OLECRANON (ELBOW) BURSA (Left) INCISION AND DRAINAGE ABSCESS (Left) as a surgical intervention.  The patient's history has been reviewed, patient examined, no change in status, stable for surgery.  I have reviewed the patient's chart and labs.  Questions were answered to the patient's satisfaction.     Sheral Apley

## 2022-06-18 NOTE — Anesthesia Preprocedure Evaluation (Signed)
Anesthesia Evaluation  Patient identified by MRN, date of birth, ID band Patient awake    Reviewed: Allergy & Precautions, H&P , NPO status , Patient's Chart, lab work & pertinent test results  Airway Mallampati: II   Neck ROM: full    Dental   Pulmonary former smoker   breath sounds clear to auscultation       Cardiovascular negative cardio ROS  Rhythm:regular Rate:Normal     Neuro/Psych  PSYCHIATRIC DISORDERS Anxiety Depression       GI/Hepatic ,,,(+)     substance abuse  alcohol useH/o pancreatitis   Endo/Other    Renal/GU      Musculoskeletal  (+) Arthritis ,    Abdominal   Peds  Hematology   Anesthesia Other Findings   Reproductive/Obstetrics                             Anesthesia Physical Anesthesia Plan  ASA: 3  Anesthesia Plan: General   Post-op Pain Management:    Induction: Intravenous  PONV Risk Score and Plan: 2 and Ondansetron, Dexamethasone, Midazolam and Treatment may vary due to age or medical condition  Airway Management Planned: LMA  Additional Equipment:   Intra-op Plan:   Post-operative Plan: Extubation in OR  Informed Consent: I have reviewed the patients History and Physical, chart, labs and discussed the procedure including the risks, benefits and alternatives for the proposed anesthesia with the patient or authorized representative who has indicated his/her understanding and acceptance.     Dental advisory given  Plan Discussed with: CRNA, Anesthesiologist and Surgeon  Anesthesia Plan Comments:        Anesthesia Quick Evaluation

## 2022-06-18 NOTE — Anesthesia Procedure Notes (Signed)
Procedure Name: LMA Insertion Date/Time: 06/18/2022 2:12 PM  Performed by: Pearson Grippe, CRNAPre-anesthesia Checklist: Patient identified, Emergency Drugs available, Suction available and Patient being monitored Patient Re-evaluated:Patient Re-evaluated prior to induction Oxygen Delivery Method: Circle system utilized Preoxygenation: Pre-oxygenation with 100% oxygen Induction Type: IV induction Ventilation: Mask ventilation without difficulty LMA: LMA inserted LMA Size: 4.0 Number of attempts: 1 Airway Equipment and Method: Bite block Placement Confirmation: positive ETCO2 Tube secured with: Tape Dental Injury: Teeth and Oropharynx as per pre-operative assessment

## 2022-06-18 NOTE — Discharge Instructions (Addendum)
POST-OPERATIVE OPIOID TAPER INSTRUCTIONS: It is important to wean off of your opioid medication as soon as possible. If you do not need pain medication after your surgery it is ok to stop day one. Opioids include: Codeine, Hydrocodone(Norco, Vicodin), Oxycodone(Percocet, oxycontin) and hydromorphone amongst others.  Long term and even short term use of opiods can cause: Increased pain response Dependence Constipation Depression Respiratory depression And more.  Withdrawal symptoms can include Flu like symptoms Nausea, vomiting And more Techniques to manage these symptoms Hydrate well Eat regular healthy meals Stay active Use relaxation techniques(deep breathing, meditating, yoga) Do Not substitute Alcohol to help with tapering If you have been on opioids for less than two weeks and do not have pain than it is ok to stop all together.  Plan to wean off of opioids This plan should start within one week post op of your joint replacement. Maintain the same interval or time between taking each dose and first decrease the dose.  Cut the total daily intake of opioids by one tablet each day Next start to increase the time between doses. The last dose that should be eliminated is the evening dose.    Post Anesthesia Home Care Instructions  Activity: Get plenty of rest for the remainder of the day. A responsible individual must stay with you for 24 hours following the procedure.  For the next 24 hours, DO NOT: -Drive a car -Advertising copywriter -Drink alcoholic beverages -Take any medication unless instructed by your physician -Make any legal decisions or sign important papers.  Meals: Start with liquid foods such as gelatin or soup. Progress to regular foods as tolerated. Avoid greasy, spicy, heavy foods. If nausea and/or vomiting occur, drink only clear liquids until the nausea and/or vomiting subsides. Call your physician if vomiting continues.  Special Instructions/Symptoms: Your  throat may feel dry or sore from the anesthesia or the breathing tube placed in your throat during surgery. If this causes discomfort, gargle with warm salt water. The discomfort should disappear within 24 hours.  Tylenol can be taken if needed after 6:17 pm

## 2022-06-18 NOTE — Op Note (Signed)
06/18/2022  2:12 PM  PATIENT:  Leonard [name]    PRE-OPERATIVE DIAGNOSIS:  LEFT ELBOW SEPTIC BURSITIS  POST-OPERATIVE DIAGNOSIS:  Same  PROCEDURE:  OLECRANON (ELBOW) BURSA, INCISION AND DRAINAGE ABSCESS  SURGEON:  Sheral Apley, MD  ASSISTANT: Levester Fresh, PA-C, he was present and scrubbed throughout the case, critical for completion in a timely fashion, and for retraction, instrumentation, and closure.   ANESTHESIA:   gen  PREOPERATIVE INDICATIONS:  Leonard [name] is a  62 y.o. male with a diagnosis of LEFT ELBOW SEPTIC BURSITIS who failed conservative measures and elected for surgical management.    The risks benefits and alternatives were discussed with the patient preoperatively including but not limited to the risks of infection, bleeding, nerve injury, cardiopulmonary complications, the need for revision surgery, among others, and the patient was willing to proceed.  OPERATIVE IMPLANTS: none  OPERATIVE FINDINGS: purulent fluid  BLOOD LOSS: min  COMPLICATIONS: none  TOURNIQUET TIME: none  OPERATIVE PROCEDURE:  Patient was identified in the preoperative holding area and site was marked by me He was transported to the operating theater and placed on the table in supine position taking care to pad all bony prominences. After a preincinduction time out anesthesia was induced. The left upper extremity was prepped and draped in normal sterile fashion and a pre-incision timeout was performed. He received ancef for preoperative antibiotics.   I made an incision over his olecranon bursa incorporating his hyper granulomatous wound.  I ellipsed this out.  This would necessitate a complex closure.  I drained his abscess of his olecranon bursa  I debrided down to the level of the olecranon bursa of any devitalized tissue or infected tissue.  I performed an olecranon bursectomy with a rondure  I placed Vanco powder after a thorough irrigation  I placed a Penrose  drain  Next I performed a complex closure.    POST OPERATIVE PLAN: Dressings full-time will return to the office Monday for drain removal

## 2022-06-19 LAB — AEROBIC/ANAEROBIC CULTURE W GRAM STAIN (SURGICAL/DEEP WOUND)

## 2022-06-20 LAB — AEROBIC/ANAEROBIC CULTURE W GRAM STAIN (SURGICAL/DEEP WOUND)

## 2022-06-21 ENCOUNTER — Other Ambulatory Visit: Payer: Self-pay

## 2022-06-21 ENCOUNTER — Encounter (HOSPITAL_BASED_OUTPATIENT_CLINIC_OR_DEPARTMENT_OTHER): Payer: Self-pay | Admitting: Orthopedic Surgery

## 2022-06-21 DIAGNOSIS — M7022 Olecranon bursitis, left elbow: Secondary | ICD-10-CM | POA: Diagnosis not present

## 2022-06-22 NOTE — Anesthesia Postprocedure Evaluation (Signed)
Anesthesia Post Note  Patient: Leonard [name]  Procedure(s) Performed: OLECRANON (ELBOW) BURSA (Left: Elbow) INCISION AND DRAINAGE ABSCESS (Left: Elbow)     Patient location during evaluation: PACU Anesthesia Type: General Level of consciousness: awake and alert Pain management: pain level controlled Vital Signs Assessment: post-procedure vital signs reviewed and stable Respiratory status: spontaneous breathing, nonlabored ventilation, respiratory function stable and patient connected to nasal cannula oxygen Cardiovascular status: blood pressure returned to baseline and stable Postop Assessment: no apparent nausea or vomiting Anesthetic complications: no   No notable events documented.  Last Vitals:  Vitals:   06/18/22 1515 06/18/22 1531  BP: 138/86 137/88  Pulse: 62 61  Resp: 14 16  Temp:  (!) 36.2 C  SpO2: 97% 99%    Last Pain:  Vitals:   06/21/22 1312  TempSrc:   PainSc: 0-No pain                 Amitai Delaughter S

## 2022-06-23 LAB — AEROBIC/ANAEROBIC CULTURE W GRAM STAIN (SURGICAL/DEEP WOUND)

## 2022-07-15 NOTE — Progress Notes (Signed)
Office Note     CC:  Ectasia of common iliac arteries Requesting Provider:  Blair Heys, MD  HPI: Leonard [name] is a 62 y.o. (01-Jun-1960) male presenting at the request of .Blair Heys, MD for evaluation of bilateral common iliac arteries.  On exam today, [name] was doing well.  A general contractor by trade, he continues to build homes part-time.  He spends his off time working out, walking, and remaining active.  He has had multiple back surgeries, therefore does not play golf.  Recently divorced, he has a new puppy-white lab occupies a significant portion of his time.  [name] denies symptoms of claudication, ischemic rest pain, tissue loss. He stated he knew that his iliac arteries were "larger than normal' roughly 10 years ago.  Nonsmoker  Past Medical History:  Diagnosis Date   Abnormal EKG    anterolateral T wave inversion   Anxiety    Arthritis    "lower back, maybe some in my neck" (08/05/2017)   Carotid artery dissection (HCC) 2016   left   Chronic lower back pain    spondylolisthesis,spondylosis,DDD,stenosis   Depression    Dizziness    History of ETOH abuse    Hyperlipidemia    was on meds but has been off x 5 months d/t elevated liver enzymes   Hyperlipidemia    statin intolerant because of elevated liver function tests   Iliac artery aneurysm, bilateral (HCC)    atable at 2.2 cm 07/28/17   Insomnia    takes Trazodone nightly   Numbness and tingling of both legs    Opiate addiction (HCC)    Pancreatitis 08/03/2017   Skin cancer    "forehead; right posterior neck"   Weakness    pain and occasionally tingling in right arm and leg    Past Surgical History:  Procedure Laterality Date   ANTERIOR CERVICAL DECOMP/DISCECTOMY FUSION  2003, 2007   x 2   ANTERIOR CERVICAL DECOMP/DISCECTOMY FUSION N/A 10/13/2017   Procedure: Cervical Four-Five Cervical Five-Six Anterior cervical decompression/discectomy/fusion with exploration of Cervical Three-Four,  Cervical Six-Seven fusions/possible removal of hardware;  Surgeon: Maeola Harman, MD;  Location: [facility] OR;  Service: Neurosurgery;  Laterality: N/A;  Cervical Four-Five Cervical Five-Six Anterior cervical decompression/discectomy/fusion with exploration of Cervical   ANTERIOR LAT LUMBAR FUSION Right 09/29/2018   Procedure: Right Lumbar three-lumbar four Anterolateral lumbar interbody fusion with lateral plate;  Surgeon: Maeola Harman, MD;  Location: [facility] OR;  Service: Neurosurgery;  Laterality: Right;   APPENDECTOMY  as a child   BACK SURGERY     BILIARY STENT PLACEMENT N/A 03/21/2019   Procedure: BILIARY STENT PLACEMENT;  Surgeon: Vida Rigger, MD;  Location: [facility];  Service: Endoscopy;  Laterality: N/A;   COLONOSCOPY     ERCP N/A 03/21/2019   Procedure: ENDOSCOPIC RETROGRADE CHOLANGIOPANCREATOGRAPHY (ERCP) with stent placement;  Surgeon: Vida Rigger, MD;  Location: [facility];  Service: Endoscopy;  Laterality: N/A;   INCISION AND DRAINAGE ABSCESS Left 06/18/2022   Procedure: INCISION AND DRAINAGE ABSCESS;  Surgeon: Sheral Apley, MD;  Location: [facility];  Service: Orthopedics;  Laterality: Left;   KNEE ARTHROSCOPY Right 2011   LUMBAR LAMINECTOMY  2014   L4-5   MOHS SURGERY     "forehead; right posterior neck"   OLECRANON BURSECTOMY Left 06/18/2022   Procedure: OLECRANON (ELBOW) BURSA;  Surgeon: Sheral Apley, MD;  Location: [facility];  Service: Orthopedics;  Laterality: Left;   POSTERIOR LUMBAR FUSION  2015  radial nerve impingement Right 2009   "near elbor"   SPHINCTEROTOMY  03/21/2019   Procedure: SPHINCTEROTOMY;  Surgeon: Vida Rigger, MD;  Location: [facility];  Service: Endoscopy;;  balloon sweep   TIBIA FRACTURE SURGERY Right as a child   compound fracture    Social History   Socioeconomic History   Marital status: Divorced    Spouse name: Not on file   Number of children: 0   Years of education: 17   Highest education level: Not on  file  Occupational History   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  Tobacco Use   Smoking status: Former    Packs/day: 0.50    Years: 2.00    Additional pack years: 0.00    Total pack years: 1.00    Types: Cigarettes   Smokeless tobacco: Former    Types: Snuff    Quit date: 09/14/2017   Tobacco comments:    smoked while he was in high school;   Vaping Use   Vaping Use: Never used  Substance and Sexual Activity   Alcohol use: Yes    Comment: drinks wine 2-3 night/week, none heavily in 2 yrs   Drug use: Not Currently   Sexual activity: Yes  Other Topics Concern   Not on file  Social History Narrative   ** Merged History Encounter **   Lives at home w/ his wife   Right-handed   Caffeine: 3 cups coffee per day   Social Determinants of Health   Financial Resource Strain: Not on file  Food Insecurity: Not on file  Transportation Needs: Not on file  Physical Activity: Not on file  Stress: Not on file  Social Connections: Not on file  Intimate Partner Violence: Not on file   Family History  Problem Relation Age of Onset   Arthritis Mother    Cirrhosis Father    Stroke Neg Hx     Current Outpatient Medications  Medication Sig Dispense Refill   aspirin 81 MG EC tablet Take 81 mg by mouth daily. Swallow whole.     cephALEXin (KEFLEX) 500 MG capsule Take 500 mg by mouth 4 (four) times daily.     Cholecalciferol (VITAMIN D3) 50 MCG (2000 UT) TABS Take 2,000 Units by mouth daily.     cholestyramine (QUESTRAN) 4 g packet Take 4 g by mouth 3 (three) times daily with meals.     Cyanocobalamin (B-12) 5000 MCG SUBL Place 5,000 mcg under the tongue daily.     escitalopram (LEXAPRO) 20 MG tablet Take 20 mg by mouth daily.     HYDROcodone-acetaminophen (NORCO) 10-325 MG tablet Take 1 tablet by mouth every 6 (six) hours as needed for severe pain. 28 tablet 0   meloxicam (MOBIC) 15 MG tablet Take 1 tablet (15 mg total) by mouth daily as needed for pain (and inflammation). 30 tablet  0   Multiple Vitamin (MULTIVITAMIN WITH MINERALS) TABS tablet Take 1 tablet by mouth daily.     naphazoline-pheniramine (NAPHCON-A) 0.025-0.3 % ophthalmic solution Place 1 drop into both eyes 2 (two) times daily as needed for eye irritation or allergies (for seasonal allergies).     omeprazole (PRILOSEC OTC) 20 MG tablet Take 1 tablet (20 mg total) by mouth daily. For gastric protection while on antibiotics 30 tablet 0   ondansetron (ZOFRAN-ODT) 4 MG disintegrating tablet Take 1 tablet (4 mg total) by mouth every 8 (eight) hours as needed for nausea or vomiting. 15 tablet 0   rosuvastatin (CRESTOR) 20 MG tablet Take  20 mg by mouth daily.     tadalafil (CIALIS) 20 MG tablet Take 20 mg by mouth daily as needed for erectile dysfunction.     traZODone (DESYREL) 150 MG tablet Take 150 mg by mouth at bedtime.     No current facility-administered medications for this visit.    Allergies  Allergen Reactions   Duloxetine Other (See Comments)    Suicidal thoughts   Sulfa Antibiotics Hives, Swelling, Rash and Other (See Comments)    Face swells and this class of meds caused welts, also (Substance With Sulfonamide Structure And Antibacterial Mechanism Of Action)   Zocor [Simvastatin] Other (See Comments)    Elevated his liver enzymes     REVIEW OF SYSTEMS:  [X]  denotes positive finding, [ ]  denotes negative finding Cardiac  Comments:  Chest pain or chest pressure:    Shortness of breath upon exertion:    Short of breath when lying flat:    Irregular heart rhythm:        Vascular    Pain in calf, thigh, or hip brought on by ambulation:    Pain in feet at night that wakes you up from your sleep:     Blood clot in your veins:    Leg swelling:         Pulmonary    Oxygen at home:    Productive cough:     Wheezing:         Neurologic    Sudden weakness in arms or legs:     Sudden numbness in arms or legs:     Sudden onset of difficulty speaking or slurred speech:    Temporary loss of  vision in one eye:     Problems with dizziness:         Gastrointestinal    Blood in stool:     Vomited blood:         Genitourinary    Burning when urinating:     Blood in urine:        Psychiatric    Major depression:         Hematologic    Bleeding problems:    Problems with blood clotting too easily:        Skin    Rashes or ulcers:        Constitutional    Fever or chills:      PHYSICAL EXAMINATION:  There were no vitals filed for this visit.  General:  WDWN in NAD; vital signs documented above Gait: Not observed HENT: WNL, normocephalic Pulmonary: normal non-labored breathing , without wheezing Cardiac: regular HR Abdomen: soft, NT, no masses Skin: with rashes Vascular Exam/Pulses:  Right Left  Radial 2+ (normal) 2+ (normal)  Ulnar    Femoral    Popliteal    DP 2+ (normal) 2+ (normal)  PT     Extremities: without ischemic changes, without Gangrene , without cellulitis; without open wounds;  Musculoskeletal: no muscle wasting or atrophy  Neurologic: A&O X 3;  No focal weakness or paresthesias are detected Psychiatric:  The pt has Normal affect.   Non-Invasive Vascular Imaging:   CT reviewed demonstrating ectasia of bilateral common iliac arteries 1.9 right, 2.1 cm left.    ASSESSMENT/PLAN: SANFORD VANVLECK is a 62 y.o. 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  Wedging was reviewed spanning from 2015, to his most recent CT scan and March of this year.  He has had no changes in the size of  his common iliac arteries.  Long discussion with [name] regarding the above.  He has some ectasia of his common iliac arteries, however no aneurysmal degeneration at this time.  Plan is to have him follow-up every 2 years.  That time we will evaluate the iliac arteries using ultrasound.  He was asked to call my office should any questions or concerns arise.   Victorino Sparrow, MD [facility] [phone]

## 2022-07-16 ENCOUNTER — Encounter: Payer: Self-pay | Admitting: Vascular Surgery

## 2022-07-16 ENCOUNTER — Ambulatory Visit: Payer: 59 | Admitting: Vascular Surgery

## 2022-07-16 VITALS — BP 157/88 | HR 69 | Temp 98.1°F | Resp 20 | Ht 69.0 in | Wt 168.0 lb

## 2022-07-16 DIAGNOSIS — I7789 Other specified disorders of arteries and arterioles: Secondary | ICD-10-CM

## 2022-12-22 IMAGING — DX DG CHEST 1V PORT
1 series · 1 of 1 positions shown · non-contrast
Comparison: 03/20/2020 chest radiograph.

CLINICAL DATA: Fever, hypoxia, headache, nonproductive cough

EXAM:
PORTABLE CHEST 1 VIEW

[chest ap]
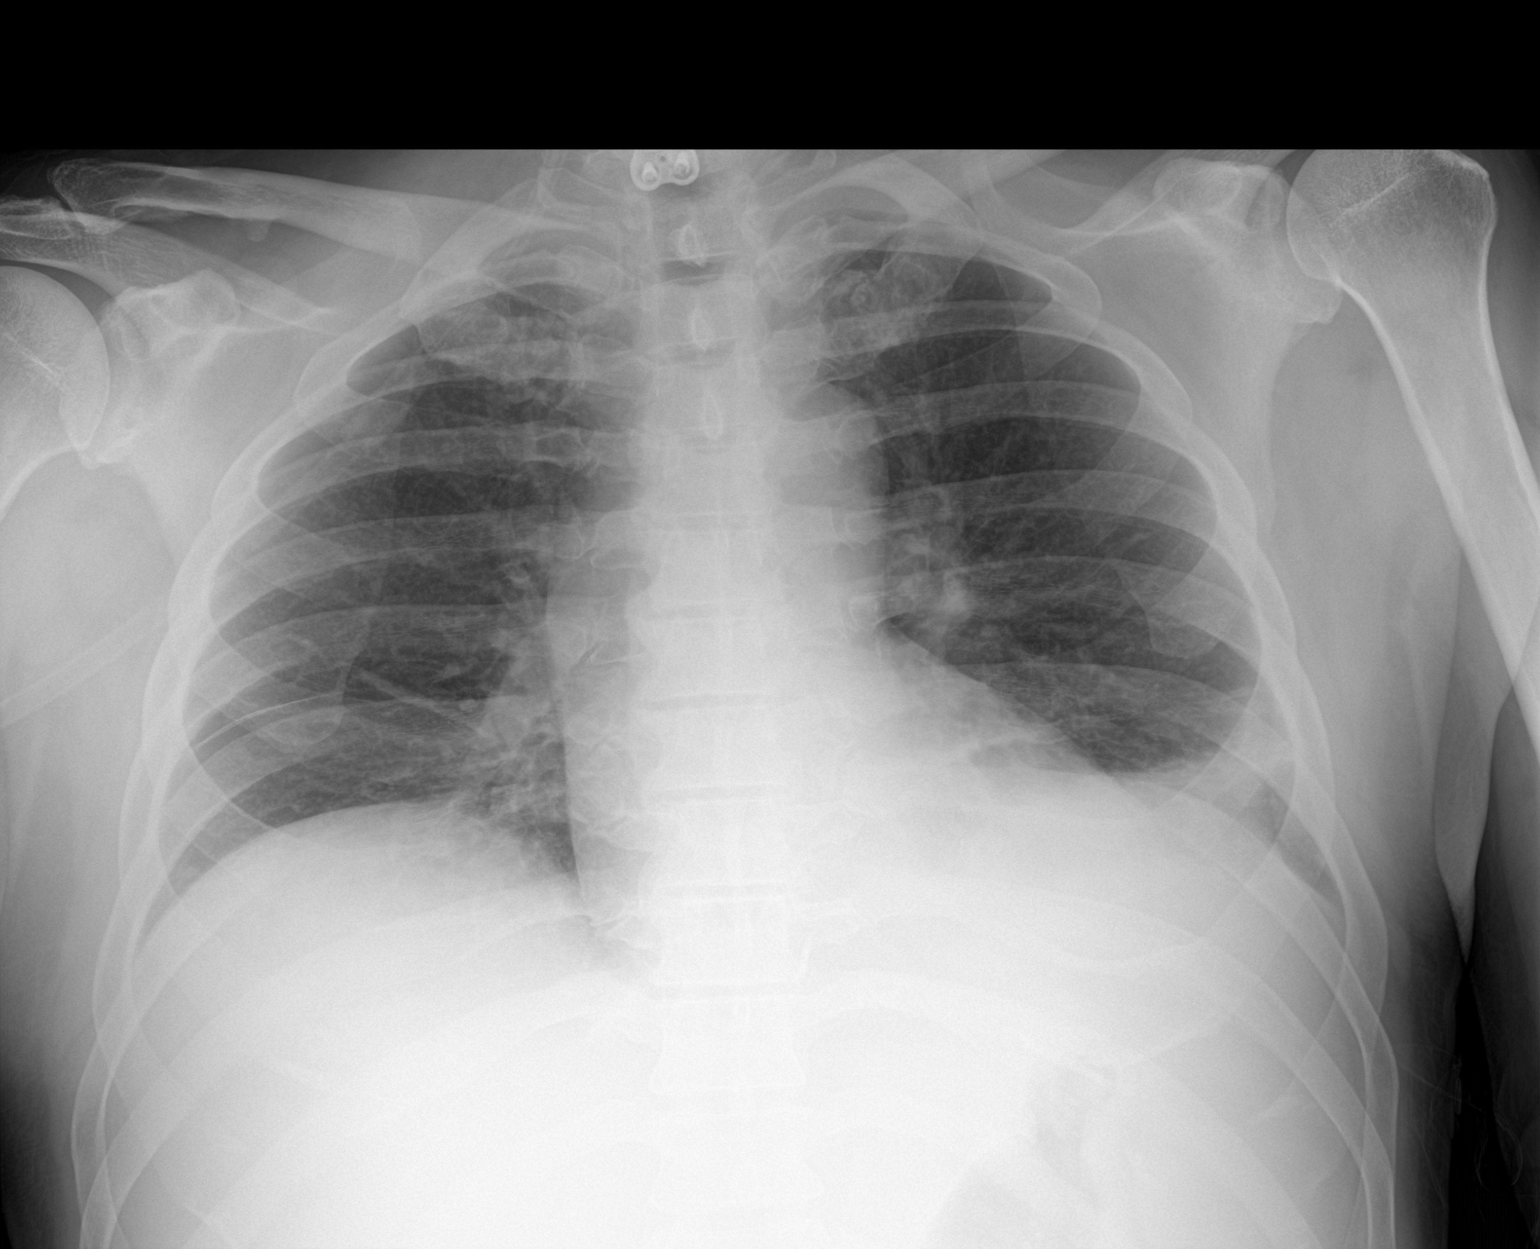

[1 of 1 positions shown; findings below may reference images not displayed]

FINDINGS: Surgical hardware from ACDF partially visualized. Stable
cardiomediastinal silhouette with normal heart size. No
pneumothorax. No pleural effusion. Patchy left lung base opacity,
similar. No pulmonary edema.
IMPRESSION: Patchy left lung base opacity, similar, suspicious for left lower
lobe pneumonia.

## 2022-12-22 IMAGING — CT CT HEAD W/O CM
3 series · 14 of 47 positions shown, 16 images · non-contrast
Comparison: October 15, 2016.

CLINICAL DATA: Headache, new or worsening.

EXAM:
CT HEAD WITHOUT CONTRAST
TECHNIQUE: Contiguous axial images were obtained from the base of the skull
through the vertex without intravenous contrast.

[Series 2: head wo · axial · 0.47mm/px · z∈[-81,+64]mm · 8 of 35 slices shown, 10 images]
[im 3/35  brain]
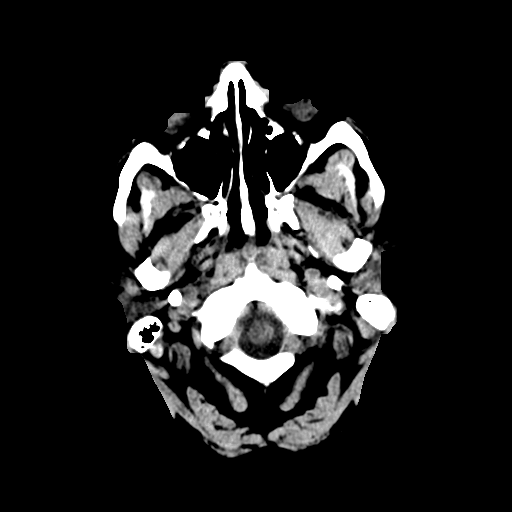
[im 3/35  bone]
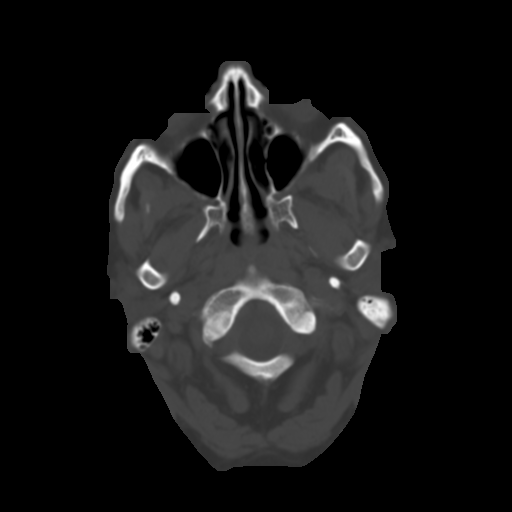
[im 8/35  brain]
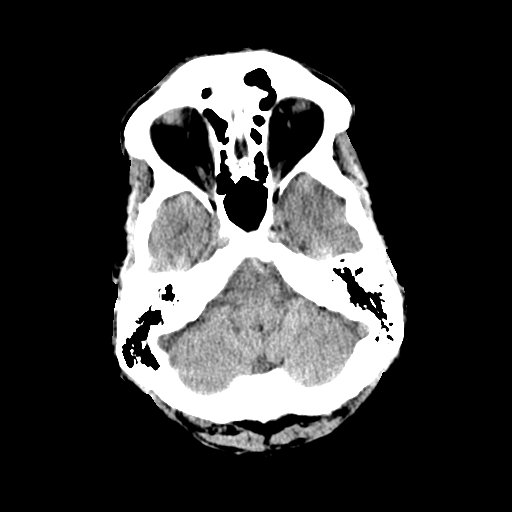
[im 11/35  brain]
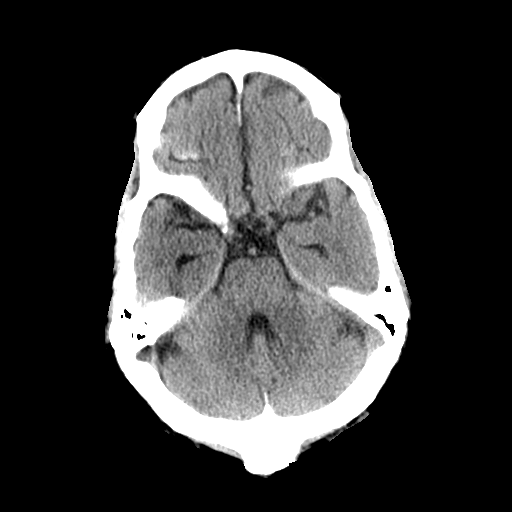
[im 16/35  brain]
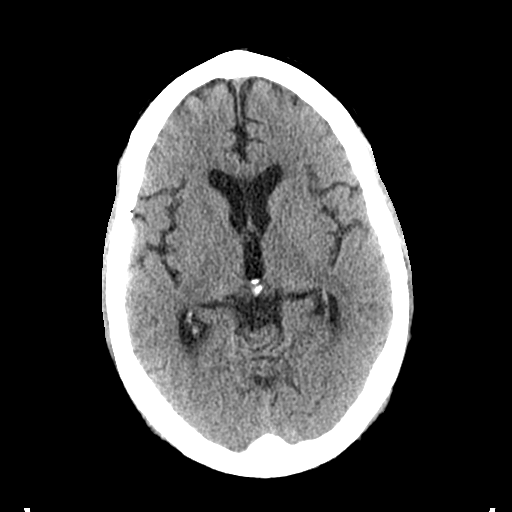
[im 19/35  brain]
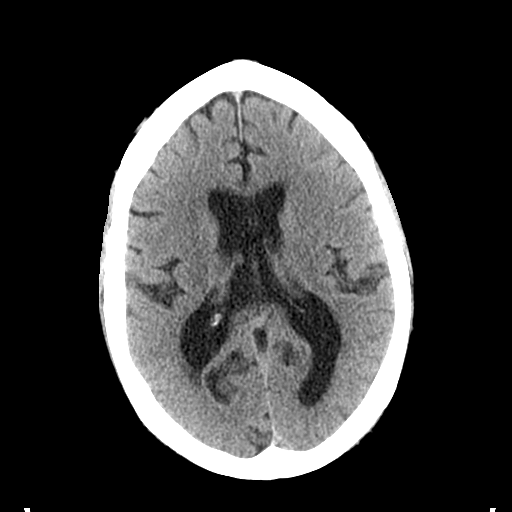
[im 19/35  bone]
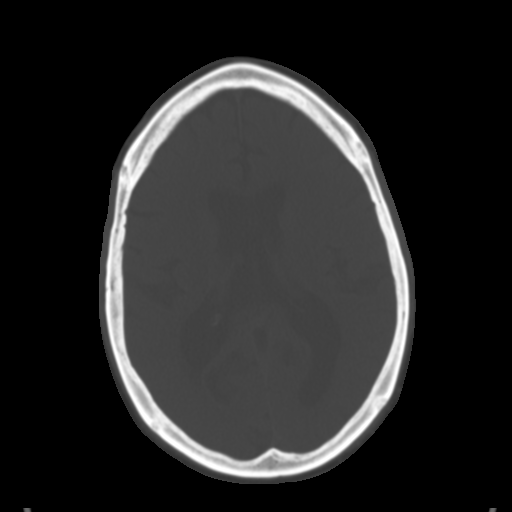
[im 24/35  brain]
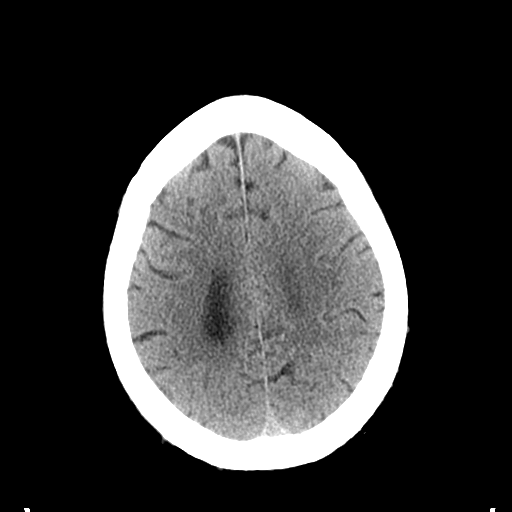
[im 27/35  brain]
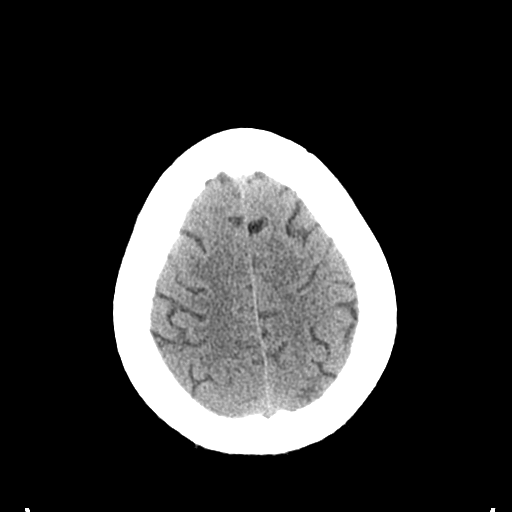
[im 32/35  brain]
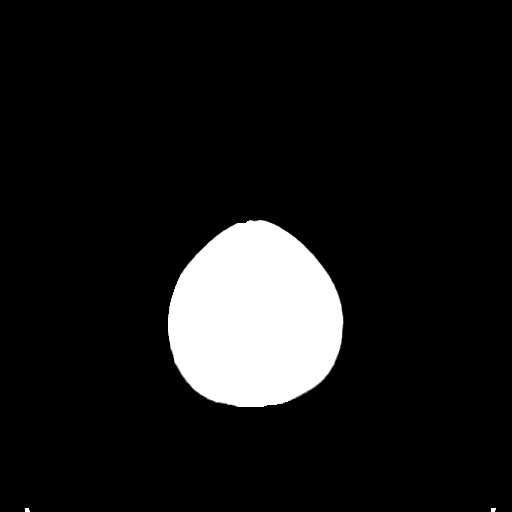

[Series 5: coronal soft tissue · coronal · 0.34mm/px · 3 of 78 slices shown]
[im 26/78  brain]
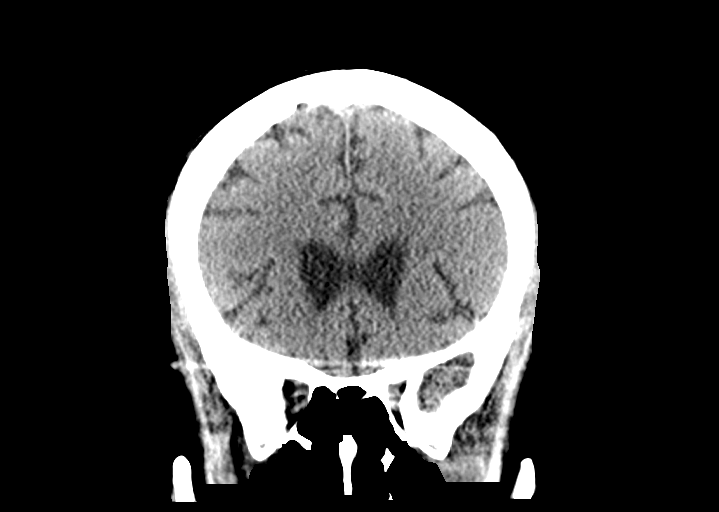
[im 35/78  brain]
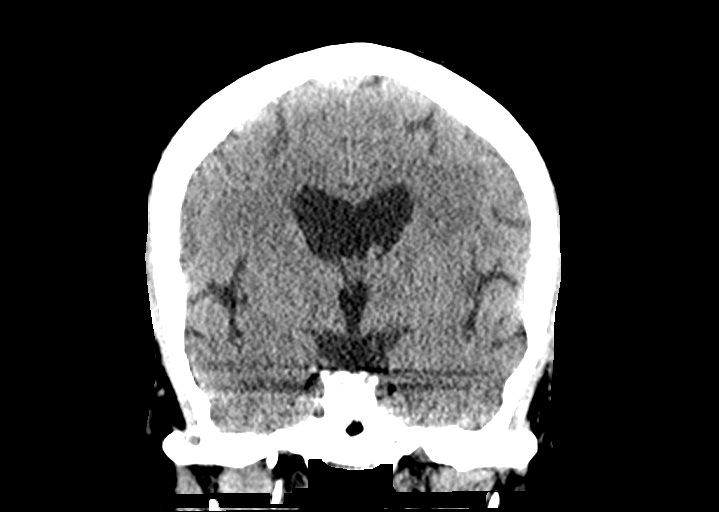
[im 43/78  brain]
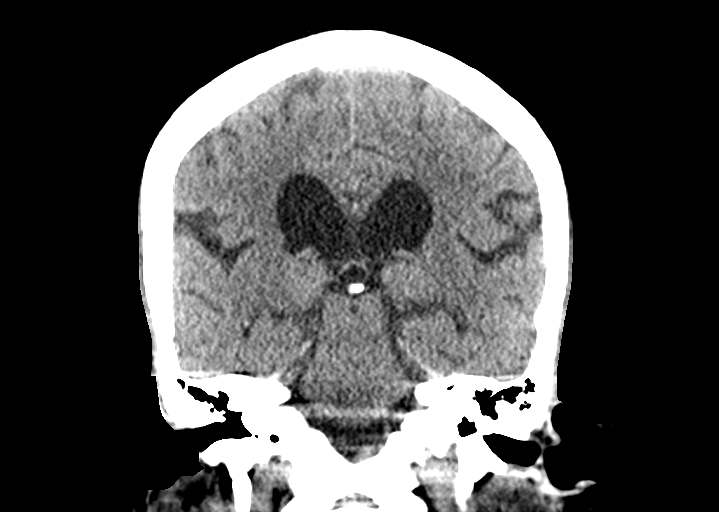

[Series 6: sagittal soft tissue · sagittal · 0.34mm/px · 3 of 62 slices shown]
[im 21/62  brain]
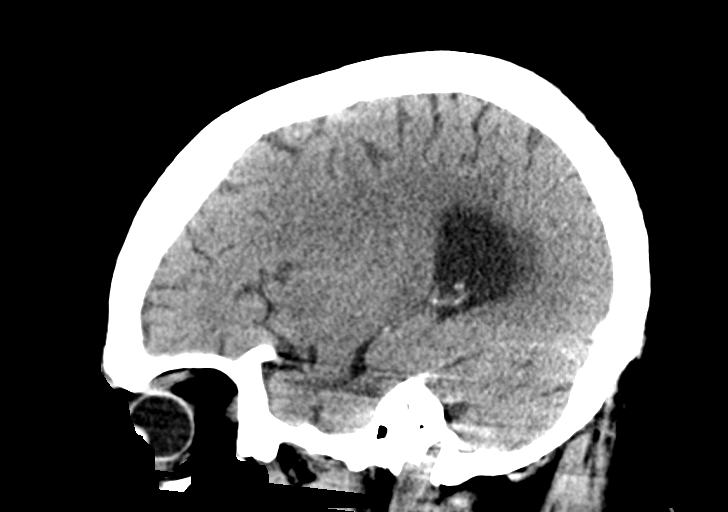
[im 31/62  brain]
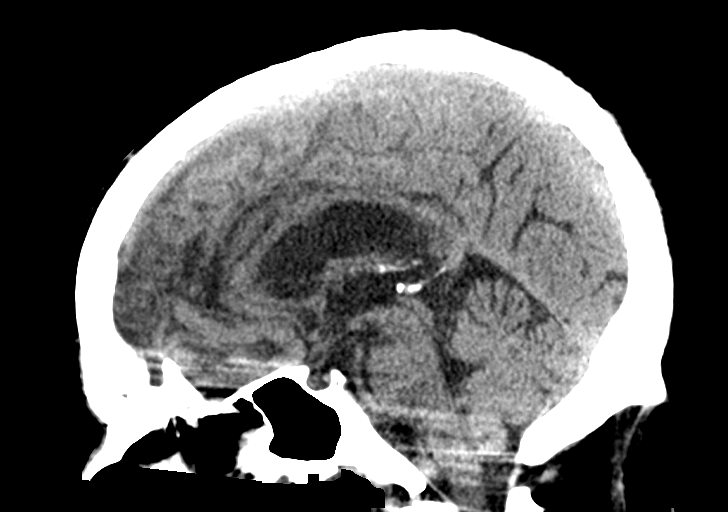
[im 41/62  brain]
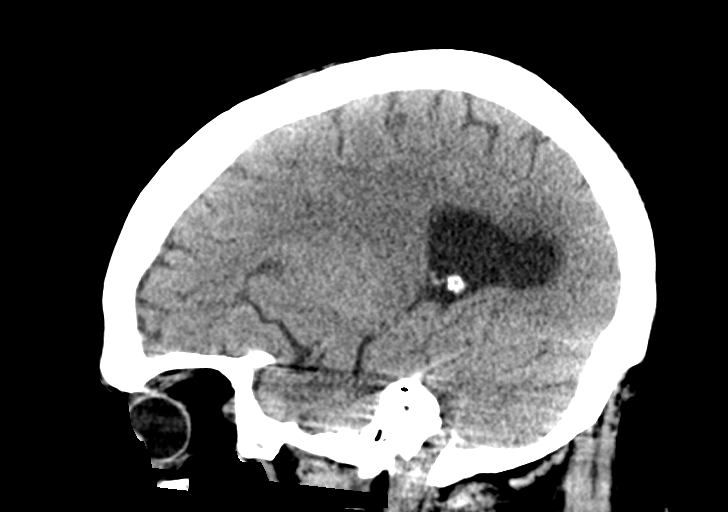

[14 of 47 positions shown; findings below may reference images not displayed]

FINDINGS: Brain: No evidence of acute infarction, hemorrhage, hydrocephalus,
extra-axial collection or mass lesion/mass effect. Similar
appearance of the ventricular system in comparison to prior. Mild
white matter hypoattenuation, likely related to chronic
microvascular ischemic disease.

Vascular: No hyperdense vessel identified. Mild calcific
atherosclerosis.

Skull: No acute fracture.

Sinuses/Orbits: Clear visualized sinuses. Unremarkable visualized
orbits.

Other: No mastoid effusions.
IMPRESSION: No evidence of acute intracranial abnormality.

## 2023-02-11 DIAGNOSIS — M25552 Pain in left hip: Secondary | ICD-10-CM | POA: Diagnosis not present

## 2023-02-11 DIAGNOSIS — M545 Low back pain, unspecified: Secondary | ICD-10-CM | POA: Diagnosis not present

## 2023-03-25 DIAGNOSIS — M545 Low back pain, unspecified: Secondary | ICD-10-CM | POA: Diagnosis not present

## 2023-04-14 DIAGNOSIS — M5416 Radiculopathy, lumbar region: Secondary | ICD-10-CM | POA: Diagnosis not present

## 2023-04-22 DIAGNOSIS — M5416 Radiculopathy, lumbar region: Secondary | ICD-10-CM | POA: Diagnosis not present

## 2023-04-26 DIAGNOSIS — M5416 Radiculopathy, lumbar region: Secondary | ICD-10-CM | POA: Diagnosis not present

## 2023-04-29 DIAGNOSIS — M5416 Radiculopathy, lumbar region: Secondary | ICD-10-CM | POA: Diagnosis not present

## 2023-07-13 DIAGNOSIS — Z85828 Personal history of other malignant neoplasm of skin: Secondary | ICD-10-CM | POA: Diagnosis not present

## 2023-07-13 DIAGNOSIS — D0439 Carcinoma in situ of skin of other parts of face: Secondary | ICD-10-CM | POA: Diagnosis not present

## 2023-07-13 DIAGNOSIS — C4442 Squamous cell carcinoma of skin of scalp and neck: Secondary | ICD-10-CM | POA: Diagnosis not present

## 2023-07-13 DIAGNOSIS — D485 Neoplasm of uncertain behavior of skin: Secondary | ICD-10-CM | POA: Diagnosis not present

## 2023-07-13 DIAGNOSIS — L570 Actinic keratosis: Secondary | ICD-10-CM | POA: Diagnosis not present

## 2023-07-13 DIAGNOSIS — L821 Other seborrheic keratosis: Secondary | ICD-10-CM | POA: Diagnosis not present

## 2023-08-05 DIAGNOSIS — M545 Low back pain, unspecified: Secondary | ICD-10-CM | POA: Diagnosis not present

## 2023-09-14 ENCOUNTER — Emergency Department (HOSPITAL_BASED_OUTPATIENT_CLINIC_OR_DEPARTMENT_OTHER)

## 2023-09-14 ENCOUNTER — Other Ambulatory Visit: Payer: Self-pay

## 2023-09-14 ENCOUNTER — Emergency Department (HOSPITAL_BASED_OUTPATIENT_CLINIC_OR_DEPARTMENT_OTHER)
Admission: EM | Admit: 2023-09-14 | Discharge: 2023-09-14 | Disposition: A | Discharge status: Home / Self Care | Source: Ambulatory Visit | Attending: Emergency Medicine | Admitting: Emergency Medicine

## 2023-09-14 DIAGNOSIS — H1131 Conjunctival hemorrhage, right eye: Secondary | ICD-10-CM | POA: Diagnosis not present

## 2023-09-14 DIAGNOSIS — R42 Dizziness and giddiness: Secondary | ICD-10-CM | POA: Insufficient documentation

## 2023-09-14 DIAGNOSIS — S0101XA Laceration without foreign body of scalp, initial encounter: Secondary | ICD-10-CM | POA: Diagnosis not present

## 2023-09-14 DIAGNOSIS — W1839XA Other fall on same level, initial encounter: Secondary | ICD-10-CM | POA: Diagnosis not present

## 2023-09-14 DIAGNOSIS — S0033XA Contusion of nose, initial encounter: Secondary | ICD-10-CM | POA: Insufficient documentation

## 2023-09-14 DIAGNOSIS — R410 Disorientation, unspecified: Secondary | ICD-10-CM | POA: Diagnosis not present

## 2023-09-14 DIAGNOSIS — K029 Dental caries, unspecified: Secondary | ICD-10-CM | POA: Diagnosis not present

## 2023-09-14 DIAGNOSIS — S0011XA Contusion of right eyelid and periocular area, initial encounter: Secondary | ICD-10-CM | POA: Diagnosis not present

## 2023-09-14 DIAGNOSIS — Z87891 Personal history of nicotine dependence: Secondary | ICD-10-CM | POA: Diagnosis not present

## 2023-09-14 DIAGNOSIS — S0510XA Contusion of eyeball and orbital tissues, unspecified eye, initial encounter: Secondary | ICD-10-CM | POA: Diagnosis not present

## 2023-09-14 DIAGNOSIS — S0012XA Contusion of left eyelid and periocular area, initial encounter: Secondary | ICD-10-CM | POA: Diagnosis not present

## 2023-09-14 DIAGNOSIS — W19XXXA Unspecified fall, initial encounter: Secondary | ICD-10-CM

## 2023-09-14 DIAGNOSIS — S0992XA Unspecified injury of nose, initial encounter: Secondary | ICD-10-CM | POA: Diagnosis not present

## 2023-09-14 DIAGNOSIS — R9431 Abnormal electrocardiogram [ECG] [EKG]: Secondary | ICD-10-CM | POA: Diagnosis not present

## 2023-09-14 DIAGNOSIS — S0993XA Unspecified injury of face, initial encounter: Secondary | ICD-10-CM

## 2023-09-14 DIAGNOSIS — S0990XA Unspecified injury of head, initial encounter: Secondary | ICD-10-CM | POA: Diagnosis not present

## 2023-09-14 LAB — CBC
HCT: 36.2 % — ABNORMAL LOW (ref 39.0–52.0)
Hemoglobin: 12.8 g/dL — ABNORMAL LOW (ref 13.0–17.0)
MCH: 33 pg (ref 26.0–34.0)
MCHC: 35.4 g/dL (ref 30.0–36.0)
MCV: 93.3 fL (ref 80.0–100.0)
Platelets: 64 K/uL — ABNORMAL LOW (ref 150–400)
RBC: 3.88 MIL/uL — ABNORMAL LOW (ref 4.22–5.81)
RDW: 11.9 % (ref 11.5–15.5)
WBC: 3.8 K/uL — ABNORMAL LOW (ref 4.0–10.5)
nRBC: 0 % (ref 0.0–0.2)

## 2023-09-14 LAB — BASIC METABOLIC PANEL WITH GFR
Anion gap: 16 — ABNORMAL HIGH (ref 5–15)
BUN: 23 mg/dL (ref 8–23)
CO2: 23 mmol/L (ref 22–32)
Calcium: 10.2 mg/dL (ref 8.9–10.3)
Chloride: 97 mmol/L — ABNORMAL LOW (ref 98–111)
Creatinine, Ser: 1.2 mg/dL (ref 0.61–1.24)
GFR, Estimated: >60 mL/min (ref >60)
Glucose, Bld: 147 mg/dL — ABNORMAL HIGH (ref 70–99)
Potassium: 3.2 mmol/L — ABNORMAL LOW (ref 3.5–5.1)
Sodium: 136 mmol/L (ref 135–145)

## 2023-09-14 LAB — TROPONIN T, HIGH SENSITIVITY: Troponin T High Sensitivity: <15 ng/L (ref <19)

## 2023-09-14 MED ORDER — POTASSIUM CHLORIDE CRYS ER 20 MEQ PO TBCR
40.0000 meq | EXTENDED_RELEASE_TABLET | Freq: Once | ORAL | Status: AC
  Administered 2023-09-14: 40 meq via ORAL
  Filled 2023-09-14: qty 2

## 2023-09-14 NOTE — ED Provider Notes (Signed)
 [place] EMERGENCY DEPARTMENT AT [facility] Provider Note   CSN: [id number] Arrival date & time: 09/14/23  1436     Patient presents with: No chief complaint on file.   Leonard [name] is a 63 y.o. male.   HPI   62 year old male presents emergency department with complaints of fall.  States the fall occurred on Saturday.  States he woke up around 10:11 PM Saturday night and went to use the restroom.  States that somehow from using the restroom to getting back to the bed, he ended up on the floor.  Unaware of how exactly he fell but he does not know waking up a few minutes after the fall occurred.  States that since the injury, has felt lightheaded, brain fog.  States that he is felt lightheaded upon getting up from a seated/laying down position and has had to stand there for a few seconds before walking due to feelings of lightheadedness.  Denies any current visual disturbance, gait abnormality, slurred speech, facial droop, new weakness or sensory deficits in upper or lower extremities.  Patient states that he went to the dermatologist today because where he hit the right side of his face broke the stitches that he had placed from a prior Mohs procedure.  His dermatologist recommended coming to the emergency department for assessment.  Past medical history significant for carotid artery dissection, chronic low back pain, alcohol abuse, iliac artery aneurysm, opiate addiction, chronic pancreatitis, alcohol dependence  Prior to Admission medications   Medication Sig Start Date End Date Taking? Authorizing Provider  aspirin  81 MG EC tablet Take 81 mg by mouth daily. Swallow whole.    [provider]  cephALEXin (KEFLEX) 500 MG capsule Take 500 mg by mouth 4 (four) times daily.    [provider]  Cholecalciferol  (VITAMIN D3) 50 MCG (2000 UT) TABS Take 2,000 Units by mouth daily.    [provider]  cholestyramine (QUESTRAN) 4 g packet Take 4 g by mouth 3  (three) times daily with meals.    [provider]  Cyanocobalamin  (B-12) 5000 MCG SUBL Place 5,000 mcg under the tongue daily.    [provider]  escitalopram (LEXAPRO) 20 MG tablet Take 20 mg by mouth daily.    [provider]  HYDROcodone -acetaminophen  (NORCO) 10-325 MG tablet Take 1 tablet by mouth every 6 (six) hours as needed for severe pain. 06/18/22   Gawne, Meghan M, PA-C  meloxicam  (MOBIC ) 15 MG tablet Take 1 tablet (15 mg total) by mouth daily as needed for pain (and inflammation). 06/18/22   Gawne, Meghan M, PA-C  Multiple Vitamin (MULTIVITAMIN WITH MINERALS) TABS tablet Take 1 tablet by mouth daily. 03/09/19   Will Almarie MATSU, MD  naphazoline-pheniramine (NAPHCON-A) 0.025-0.3 % ophthalmic solution Place 1 drop into both eyes 2 (two) times daily as needed for eye irritation or allergies (for seasonal allergies).    [provider]  omeprazole  (PRILOSEC  OTC) 20 MG tablet Take 1 tablet (20 mg total) by mouth daily. For gastric protection while on antibiotics 06/18/22 07/18/22  Gawne, Meghan M, PA-C  ondansetron  (ZOFRAN -ODT) 4 MG disintegrating tablet Take 1 tablet (4 mg total) by mouth every 8 (eight) hours as needed for nausea or vomiting. 06/18/22   Gawne, Meghan M, PA-C  rosuvastatin  (CRESTOR ) 20 MG tablet Take 20 mg by mouth daily.    [provider]  tadalafil (CIALIS) 20 MG tablet Take 20 mg by mouth daily as needed for erectile dysfunction.    [provider]  traZODone  (DESYREL ) 150 MG tablet Take 150 mg by mouth at bedtime.    [provider]    Allergies: Duloxetine, Sulfa antibiotics, Zocor [simvastatin], and Sertraline     Review of Systems  All other systems reviewed and are negative.   Updated Vital Signs There were no vitals taken for this visit.  Physical Exam Vitals and nursing note reviewed.  Constitutional:      General: He is not in acute distress.    Appearance: He is well-developed.  HENT:      Head: Normocephalic.     Comments: Ecchymosis appreciated periorbitally as well as the bridge of nose.  Right-sided sub conjunctival hemorrhage.    Right Ear: Tympanic membrane normal.     Left Ear: Tympanic membrane normal.     Nose:     Comments: No evidence of septal hematoma. Eyes:     Conjunctiva/sclera: Conjunctivae normal.  Cardiovascular:     Rate and Rhythm: Normal rate and regular rhythm.     Heart sounds: No murmur heard. Pulmonary:     Effort: Pulmonary effort is normal. No respiratory distress.     Breath sounds: Normal breath sounds.  Abdominal:     Palpations: Abdomen is soft.     Tenderness: There is no abdominal tenderness.  Musculoskeletal:        General: No swelling.     Cervical back: Neck supple.     Comments: Full range of motion of bilateral upper lower extremities without overlying tenderness to palpation.  Patient with area of scabbing appreciated right anterior knee.  No obvious tenderness of right knee with full range of motion.  Skin:    General: Skin is warm and dry.     Capillary Refill: Capillary refill takes less than 2 seconds.  Neurological:     Mental Status: He is alert.     Comments: Alert and oriented to self, place, time and event.   Speech is fluent, clear without dysarthria or dysphasia.   Strength 5/5 in upper/lower extremities   Sensation intact in upper/lower extremities   Patient ambulates independently without difficulty. CN I not tested  CN II not tested CN III, IV, VI PERRLA and EOMs intact bilaterally  CN V Intact sensation to sharp and light touch to the face  CN VII facial movements symmetric  CN VIII not tested  CN IX, X no uvula deviation, symmetric rise of soft palate  CN XI 5/5 SCM and trapezius strength bilaterally  CN XII Midline tongue protrusion, symmetric L/R movements     Psychiatric:        Mood and Affect: Mood normal.     (all labs ordered are listed, but only abnormal results are displayed) Labs  Reviewed - No data to display  EKG: None  Radiology: No results found.   Procedures   Medications Ordered in the ED - No data to display                                  Medical Decision Making Amount and/or Complexity of Data Reviewed Labs: ordered. Radiology: ordered.  Risk Prescription drug management.   This patient presents to the ED for concern of fall, this involves an extensive number of treatment options, and is a complaint that carries with it a high risk of complications and morbidity.  The differential diagnosis includes CVA, fracture, strain/pain, dislocation, ligament/tendon injury, neurovascular compromise, solid organ damage,  ACS, orthostatic hypotension, vasovagal, arrhythmia, seizure, other   Co morbidities that complicate the patient evaluation  See HPI   Additional history obtained:  Additional history obtained from EMR External records from outside source obtained and reviewed including spittle records   Lab Tests:  I Ordered, and personally interpreted labs.  The pertinent results include: Leukopenia 3.8.,  Cytopenia 54.  Anemia hemoglobin of 12.8; most recent labs 3 years ago.  Troponin of less than 15.  Mild hypokalemia 3.2, hypochloremia of 97.  No renal dysfunction.   Imaging Studies ordered:  I ordered imaging studies including CT head/maxillofacial I independently visualized and interpreted imaging which showed small frontal right scalp block.  No acute intracranial process.  Postsurgical changes right infraorbital soft tissues consistent with recent dermatologic surgery. I agree with the radiologist interpretation  Cardiac Monitoring: / EKG:  The patient was maintained on a cardiac monitor.  I personally viewed and interpreted the cardiac monitored which showed an underlying rhythm of: Sinus rhythm.  Right bundle branch block.   Consultations Obtained:  N/a   Problem List / ED Course / Critical interventions / Medication  management  Fall I ordered medication including potassium chloride    Reevaluation of the patient after these medicines showed that the patient improved I have reviewed the patients home medicines and have made adjustments as needed   Social Determinants of Health:  Former cigarette use.  Opiate abuse, alcohol abuse   Test / Admission - Considered:  Fall Vitals signs  within normal range and stable throughout visit. Laboratory/imaging studies significant for: See above 63 year old male presents emergency department with complaints of fall.  States the fall occurred on Saturday.  States he woke up around 10:11 PM Saturday night and went to use the restroom.  States that somehow from using the restroom to getting back to the bed, he ended up on the floor.  Unaware of how exactly he fell but he does not know waking up a few minutes after the fall occurred.  States that since the injury, has felt lightheaded, brain fog.  States that he is felt lightheaded upon getting up from a seated/laying down position and has had to stand there for a few seconds before walking due to feelings of lightheadedness.  Denies any current visual disturbance, gait abnormality, slurred speech, facial droop, new weakness or sensory deficits in upper or lower extremities.  Patient states that he went to the dermatologist today because where he hit the right side of his face broke the stitches that he had placed from a prior Mohs procedure.  His dermatologist recommended coming to the emergency department for assessment. On exam, nonfocal neurologic exam from baseline.  Patient with ecchymosis appreciated on the face as well as right-sided subconjunctival hemorrhage.  Given significant blunt facial trauma, CT imaging of patient's head and cervical spine obtained which did not show any acute abnormality aside soft tissue injuries as above.  Patient's labs as above; labs most recently 3 years ago with no recent comparison.   Will recommend repeat testing by primary care in the outpatient setting.  Patient's EKG showed a right bundle; patient without any current chest pain, shortness of breath or exertional worsening of symptoms; low suspicion for ACS.  Patient described having lightheadedness upon standing from a seated/laying down position which was reproduced in the emergency department and able to walk normally 1 sensation had resolved.  Query if this happened causing his fall on Saturday as he did sit down to use the restroom  and stood up.  When discussing this with patient, he states that he thinks that that is exactly what happened as he is felt this in the past but has never fallen from this occurring and has been able to brace himself.  Suspect orthostatic hypotension.  Patient's symptoms do not seem consistent with seizure.  Patient's symptoms do not seem likely related to arrhythmia, cardiac or pulmonary complication given history.  Recommend symptomatic therapy of injuries as in AVS and close follow-up with PCP in the outpatient setting.  Treatment plan discussed with patient and he acknowledged understanding was agreeable to said plan.  Patient overall well-appearing, afebrile in no acute distress. Worrisome signs and symptoms were discussed with the patient, and the patient acknowledged understanding to return to the ED if noticed. Patient was stable upon discharge.       Final diagnoses:  None    ED Discharge Orders     None          Silver Wonda LABOR, [place] 09/14/23 1650    Jerrol Agent, MD 09/14/23 1735

## 2023-09-14 NOTE — ED Triage Notes (Signed)
 Pt via pov from home after a fall on Saturday. He doesn't know why he fell, but believes he passed out. He awakened with bleeding from his head. Pt has raccoon eyes bruising. He states he had MOHS surgery 2 weeks ago and when he awakened the stitches had burst. He went to the clinic to fix that and was told to the ED.   Pt states he has brain fog and dizziness, with spatial disorientation that has been going on for a couple of days. Denies n/v. Endorses headache.   NAD noted.

## 2023-09-14 NOTE — Discharge Instructions (Addendum)
 As discussed, CT scan of your head and face appeared normal from a bony perspective.  No evidence of brain bleed.  Suspect that you may have a concussion.  Your labs showed potassium was slightly low and you have a little dehydrated.  Recommend electrolyte rich fluid segmentation in the outpatient setting such as sugar-free Gatorade, liquid IV, Body Armor, Pedialyte.  Recommend rechecking your labs at the primary care provider.  Please do not hesitate to return to the emergency department if the worrisome signs and symptoms we discussed become apparent.
